# Patient Record
Sex: Female | Born: 1950
Health system: Southern US, Community
[De-identification: ages and names within clinical notes are randomized; demographics above are authoritative.]

## PROBLEM LIST (undated history)

## (undated) DIAGNOSIS — C50919 Malignant neoplasm of unspecified site of unspecified female breast: Secondary | ICD-10-CM

## (undated) DIAGNOSIS — Z923 Personal history of irradiation: Secondary | ICD-10-CM

## (undated) DIAGNOSIS — C801 Malignant (primary) neoplasm, unspecified: Secondary | ICD-10-CM

## (undated) DIAGNOSIS — Z9889 Other specified postprocedural states: Secondary | ICD-10-CM

## (undated) DIAGNOSIS — I4892 Unspecified atrial flutter: Secondary | ICD-10-CM

## (undated) DIAGNOSIS — K922 Gastrointestinal hemorrhage, unspecified: Secondary | ICD-10-CM

## (undated) DIAGNOSIS — I10 Essential (primary) hypertension: Secondary | ICD-10-CM

## (undated) DIAGNOSIS — K219 Gastro-esophageal reflux disease without esophagitis: Secondary | ICD-10-CM

## (undated) DIAGNOSIS — T7840XA Allergy, unspecified, initial encounter: Secondary | ICD-10-CM

## (undated) DIAGNOSIS — M810 Age-related osteoporosis without current pathological fracture: Secondary | ICD-10-CM

## (undated) DIAGNOSIS — I480 Paroxysmal atrial fibrillation: Secondary | ICD-10-CM

## (undated) DIAGNOSIS — Z86010 Personal history of colonic polyps: Secondary | ICD-10-CM

## (undated) DIAGNOSIS — C4491 Basal cell carcinoma of skin, unspecified: Secondary | ICD-10-CM

## (undated) DIAGNOSIS — T884XXA Failed or difficult intubation, initial encounter: Secondary | ICD-10-CM

## (undated) DIAGNOSIS — E876 Hypokalemia: Secondary | ICD-10-CM

## (undated) DIAGNOSIS — R112 Nausea with vomiting, unspecified: Secondary | ICD-10-CM

## (undated) DIAGNOSIS — Z8639 Personal history of other endocrine, nutritional and metabolic disease: Secondary | ICD-10-CM

## (undated) DIAGNOSIS — D649 Anemia, unspecified: Secondary | ICD-10-CM

## (undated) HISTORY — DX: Anemia, unspecified: D64.9

## (undated) HISTORY — DX: Gastrointestinal hemorrhage, unspecified: K92.2

## (undated) HISTORY — DX: Essential (primary) hypertension: I10

## (undated) HISTORY — DX: Gastro-esophageal reflux disease without esophagitis: K21.9

## (undated) HISTORY — PX: GASTRIC BYPASS: SHX52

## (undated) HISTORY — PX: BREAST LUMPECTOMY: SHX2

## (undated) HISTORY — PX: SPINE SURGERY: SHX786

## (undated) HISTORY — PX: APPENDECTOMY: SHX54

## (undated) HISTORY — DX: Allergy, unspecified, initial encounter: T78.40XA

## (undated) HISTORY — DX: Age-related osteoporosis without current pathological fracture: M81.0

## (undated) HISTORY — DX: Hypokalemia: E87.6

## (undated) HISTORY — DX: Personal history of other endocrine, nutritional and metabolic disease: Z86.39

## (undated) HISTORY — DX: Malignant (primary) neoplasm, unspecified: C80.1

## (undated) HISTORY — DX: Personal history of colonic polyps: Z86.010

## (undated) HISTORY — PX: REDUCTION MAMMAPLASTY: SUR839

## (undated) HISTORY — DX: Paroxysmal atrial fibrillation: I48.0

## (undated) HISTORY — DX: Basal cell carcinoma of skin, unspecified: C44.91

## (undated) HISTORY — DX: Unspecified atrial flutter: I48.92

## (undated) HISTORY — PX: CARPAL TUNNEL RELEASE: SHX101

## (undated) HISTORY — PX: CHOLECYSTECTOMY: SHX55

---

## 1985-06-16 HISTORY — PX: OTHER SURGICAL HISTORY: SHX169

## 1997-06-16 HISTORY — PX: TOTAL ABDOMINAL HYSTERECTOMY: SHX209

## 1998-06-16 DIAGNOSIS — M810 Age-related osteoporosis without current pathological fracture: Secondary | ICD-10-CM

## 1998-06-16 HISTORY — DX: Age-related osteoporosis without current pathological fracture: M81.0

## 1998-07-17 ENCOUNTER — Encounter: Payer: Self-pay | Admitting: Orthopedic Surgery

## 1998-07-17 ENCOUNTER — Ambulatory Visit (HOSPITAL_COMMUNITY): Admission: RE | Admit: 1998-07-17 | Discharge: 1998-07-17 | Payer: Self-pay | Admitting: Orthopedic Surgery

## 1998-09-12 ENCOUNTER — Encounter: Payer: Self-pay | Admitting: Neurosurgery

## 1998-09-12 ENCOUNTER — Inpatient Hospital Stay (HOSPITAL_COMMUNITY): Admission: RE | Admit: 1998-09-12 | Discharge: 1998-09-12 | Payer: Self-pay | Admitting: Neurosurgery

## 1998-10-07 ENCOUNTER — Ambulatory Visit (HOSPITAL_COMMUNITY): Admission: RE | Admit: 1998-10-07 | Discharge: 1998-10-07 | Payer: Self-pay | Admitting: Neurosurgery

## 1998-10-07 ENCOUNTER — Encounter: Payer: Self-pay | Admitting: Neurosurgery

## 1998-10-25 ENCOUNTER — Ambulatory Visit (HOSPITAL_COMMUNITY): Admission: RE | Admit: 1998-10-25 | Discharge: 1998-10-25 | Payer: Self-pay | Admitting: Neurosurgery

## 1998-11-15 ENCOUNTER — Ambulatory Visit (HOSPITAL_COMMUNITY): Admission: RE | Admit: 1998-11-15 | Discharge: 1998-11-15 | Payer: Self-pay | Admitting: Family Medicine

## 1998-11-15 ENCOUNTER — Encounter: Payer: Self-pay | Admitting: Family Medicine

## 1999-08-12 ENCOUNTER — Other Ambulatory Visit: Admission: RE | Admit: 1999-08-12 | Discharge: 1999-08-12 | Payer: Self-pay | Admitting: Obstetrics and Gynecology

## 2001-03-17 ENCOUNTER — Encounter: Admission: RE | Admit: 2001-03-17 | Discharge: 2001-06-15 | Payer: Self-pay | Admitting: Internal Medicine

## 2001-12-22 ENCOUNTER — Encounter: Payer: Self-pay | Admitting: Neurosurgery

## 2001-12-22 ENCOUNTER — Encounter: Admission: RE | Admit: 2001-12-22 | Discharge: 2001-12-22 | Payer: Self-pay | Admitting: Neurosurgery

## 2003-06-17 DIAGNOSIS — Z9884 Bariatric surgery status: Secondary | ICD-10-CM

## 2003-06-17 HISTORY — DX: Bariatric surgery status: Z98.84

## 2003-08-25 ENCOUNTER — Other Ambulatory Visit: Admission: RE | Admit: 2003-08-25 | Discharge: 2003-08-25 | Payer: Self-pay | Admitting: Radiology

## 2003-08-25 ENCOUNTER — Encounter (INDEPENDENT_AMBULATORY_CARE_PROVIDER_SITE_OTHER): Payer: Self-pay | Admitting: *Deleted

## 2003-08-25 ENCOUNTER — Encounter: Admission: RE | Admit: 2003-08-25 | Discharge: 2003-08-25 | Payer: Self-pay | Admitting: Obstetrics and Gynecology

## 2004-05-08 ENCOUNTER — Ambulatory Visit: Payer: Self-pay | Admitting: Internal Medicine

## 2004-05-17 ENCOUNTER — Ambulatory Visit: Payer: Self-pay | Admitting: Internal Medicine

## 2004-12-19 ENCOUNTER — Ambulatory Visit: Payer: Self-pay | Admitting: Internal Medicine

## 2004-12-23 ENCOUNTER — Ambulatory Visit: Payer: Self-pay | Admitting: Internal Medicine

## 2005-04-01 ENCOUNTER — Ambulatory Visit: Payer: Self-pay | Admitting: Internal Medicine

## 2005-06-18 ENCOUNTER — Ambulatory Visit: Payer: Self-pay | Admitting: Internal Medicine

## 2005-08-05 ENCOUNTER — Ambulatory Visit: Payer: Self-pay | Admitting: Internal Medicine

## 2005-11-05 ENCOUNTER — Ambulatory Visit: Payer: Self-pay | Admitting: Internal Medicine

## 2005-11-20 ENCOUNTER — Ambulatory Visit: Payer: Self-pay | Admitting: Internal Medicine

## 2006-02-20 ENCOUNTER — Ambulatory Visit: Payer: Self-pay | Admitting: Internal Medicine

## 2006-03-17 ENCOUNTER — Ambulatory Visit: Payer: Self-pay | Admitting: Internal Medicine

## 2006-05-30 ENCOUNTER — Encounter: Admission: RE | Admit: 2006-05-30 | Discharge: 2006-05-30 | Payer: Self-pay | Admitting: Internal Medicine

## 2007-02-11 ENCOUNTER — Encounter: Payer: Self-pay | Admitting: Internal Medicine

## 2007-03-08 DIAGNOSIS — I10 Essential (primary) hypertension: Secondary | ICD-10-CM | POA: Insufficient documentation

## 2007-03-08 DIAGNOSIS — F329 Major depressive disorder, single episode, unspecified: Secondary | ICD-10-CM | POA: Insufficient documentation

## 2007-03-08 DIAGNOSIS — R51 Headache: Secondary | ICD-10-CM | POA: Insufficient documentation

## 2007-03-08 DIAGNOSIS — R519 Headache, unspecified: Secondary | ICD-10-CM | POA: Insufficient documentation

## 2007-03-30 ENCOUNTER — Encounter: Payer: Self-pay | Admitting: Internal Medicine

## 2007-04-16 LAB — CONVERTED CEMR LAB: Pap Smear: NORMAL

## 2007-05-20 ENCOUNTER — Encounter: Payer: Self-pay | Admitting: Internal Medicine

## 2007-05-28 ENCOUNTER — Ambulatory Visit: Payer: Self-pay | Admitting: Internal Medicine

## 2007-05-28 DIAGNOSIS — E538 Deficiency of other specified B group vitamins: Secondary | ICD-10-CM | POA: Insufficient documentation

## 2007-09-22 ENCOUNTER — Ambulatory Visit: Payer: Self-pay | Admitting: Internal Medicine

## 2007-09-22 LAB — CONVERTED CEMR LAB
Folate: 11.1 ng/mL
Hgb A1c MFr Bld: 5.4 % (ref 4.6–6.0)
Iron: 87 ug/dL (ref 42–145)
Saturation Ratios: 23.9 % (ref 20.0–50.0)
Transferrin: 260.3 mg/dL (ref >=212.0)
Vitamin B-12: 622 pg/mL (ref 211–911)

## 2007-09-23 ENCOUNTER — Encounter: Payer: Self-pay | Admitting: Internal Medicine

## 2007-09-23 LAB — CONVERTED CEMR LAB: Vit D, 1,25-Dihydroxy: 15 — ABNORMAL LOW (ref 30–89)

## 2007-10-01 ENCOUNTER — Ambulatory Visit: Payer: Self-pay | Admitting: Internal Medicine

## 2007-10-01 DIAGNOSIS — E569 Vitamin deficiency, unspecified: Secondary | ICD-10-CM | POA: Insufficient documentation

## 2008-01-20 ENCOUNTER — Ambulatory Visit: Payer: Self-pay | Admitting: Internal Medicine

## 2008-01-20 LAB — CONVERTED CEMR LAB
Basophils Absolute: 0 10*3/uL (ref 0.0–0.1)
Basophils Relative: 0.4 % (ref 0.0–3.0)
Eosinophils Absolute: 0.1 10*3/uL (ref 0.0–0.7)
Eosinophils Relative: 1.2 % (ref 0.0–5.0)
HCT: 35.3 % — ABNORMAL LOW (ref 36.0–46.0)
Hemoglobin: 12.2 g/dL (ref 12.0–15.0)
Inflenza A Ag: NEGATIVE
Lymphocytes Relative: 30 % (ref 12.0–46.0)
MCHC: 34.5 g/dL (ref 30.0–36.0)
MCV: 94.8 fL (ref 78.0–100.0)
Monocytes Absolute: 0.4 10*3/uL (ref 0.1–1.0)
Monocytes Relative: 6.4 % (ref 3.0–12.0)
Neutro Abs: 3.3 10*3/uL (ref 1.4–7.7)
Neutrophils Relative %: 62 % (ref 43.0–77.0)
Platelets: 197 10*3/uL (ref 150–400)
RBC: 3.72 M/uL — ABNORMAL LOW (ref 3.87–5.11)
RDW: 12.8 % (ref 11.5–14.6)
WBC: 5.5 10*3/uL (ref 4.5–10.5)

## 2008-01-27 ENCOUNTER — Telehealth: Payer: Self-pay | Admitting: Internal Medicine

## 2008-03-22 ENCOUNTER — Ambulatory Visit: Payer: Self-pay | Admitting: Internal Medicine

## 2008-03-22 DIAGNOSIS — J0100 Acute maxillary sinusitis, unspecified: Secondary | ICD-10-CM | POA: Insufficient documentation

## 2008-03-22 DIAGNOSIS — M6740 Ganglion, unspecified site: Secondary | ICD-10-CM | POA: Insufficient documentation

## 2008-03-23 ENCOUNTER — Encounter: Payer: Self-pay | Admitting: Internal Medicine

## 2008-03-28 LAB — CONVERTED CEMR LAB: Vit D, 1,25-Dihydroxy: 22 — ABNORMAL LOW (ref 30–89)

## 2008-05-22 ENCOUNTER — Encounter: Payer: Self-pay | Admitting: Internal Medicine

## 2009-03-16 ENCOUNTER — Ambulatory Visit: Payer: Self-pay | Admitting: Internal Medicine

## 2009-03-16 LAB — CONVERTED CEMR LAB
ALT: 17 units/L (ref 0–35)
AST: 19 units/L (ref 0–37)
Albumin: 3.8 g/dL (ref 3.5–5.2)
Alkaline Phosphatase: 104 units/L (ref 39–117)
BUN: 13 mg/dL (ref 6–23)
Basophils Absolute: 0 10*3/uL (ref 0.0–0.1)
Basophils Relative: 0.6 % (ref 0.0–3.0)
Bilirubin Urine: NEGATIVE
Bilirubin, Direct: 0.1 mg/dL (ref 0.0–0.3)
CO2: 31 meq/L (ref 19–32)
Calcium: 9.1 mg/dL (ref 8.4–10.5)
Chloride: 109 meq/L (ref 96–112)
Cholesterol: 147 mg/dL (ref 0–200)
Creatinine, Ser: 0.6 mg/dL (ref 0.4–1.2)
Creatinine,U: 88.6 mg/dL
Eosinophils Absolute: 0.1 10*3/uL (ref 0.0–0.7)
Eosinophils Relative: 1.3 % (ref 0.0–5.0)
GFR calc non Af Amer: 109.93 mL/min (ref >60)
Glucose, Bld: 95 mg/dL (ref 70–99)
Glucose, Urine, Semiquant: NEGATIVE
HCT: 38.7 % (ref 36.0–46.0)
HDL: 60.9 mg/dL (ref >39.00)
Hemoglobin: 13.4 g/dL (ref 12.0–15.0)
Hgb A1c MFr Bld: 5.4 % (ref 4.6–6.5)
Ketones, urine, test strip: NEGATIVE
LDL Cholesterol: 78 mg/dL (ref 0–99)
Lymphocytes Relative: 30 % (ref 12.0–46.0)
Lymphs Abs: 1.5 10*3/uL (ref 0.7–4.0)
MCHC: 34.6 g/dL (ref 30.0–36.0)
MCV: 92.7 fL (ref 78.0–100.0)
Microalb Creat Ratio: 2.3 mg/g (ref 0.0–30.0)
Microalb, Ur: 0.2 mg/dL (ref 0.0–1.9)
Monocytes Absolute: 0.4 10*3/uL (ref 0.1–1.0)
Monocytes Relative: 7.7 % (ref 3.0–12.0)
Neutro Abs: 3 10*3/uL (ref 1.4–7.7)
Neutrophils Relative %: 60.4 % (ref 43.0–77.0)
Nitrite: NEGATIVE
Platelets: 193 10*3/uL (ref 150.0–400.0)
Potassium: 3.7 meq/L (ref 3.5–5.1)
Protein, U semiquant: NEGATIVE
RBC: 4.17 M/uL (ref 3.87–5.11)
RDW: 12.5 % (ref 11.5–14.6)
Sodium: 142 meq/L (ref 135–145)
Specific Gravity, Urine: 1.02
TSH: 1.84 microintl units/mL (ref 0.35–5.50)
Total Bilirubin: 0.6 mg/dL (ref 0.3–1.2)
Total CHOL/HDL Ratio: 2
Total Protein: 6.5 g/dL (ref 6.0–8.3)
Triglycerides: 41 mg/dL (ref 0.0–149.0)
Urobilinogen, UA: 1
VLDL: 8.2 mg/dL (ref 0.0–40.0)
WBC Urine, dipstick: NEGATIVE
WBC: 5 10*3/uL (ref 4.5–10.5)
pH: 6

## 2009-03-27 ENCOUNTER — Ambulatory Visit: Payer: Self-pay | Admitting: Internal Medicine

## 2009-03-27 DIAGNOSIS — L500 Allergic urticaria: Secondary | ICD-10-CM | POA: Insufficient documentation

## 2009-06-15 LAB — CONVERTED CEMR LAB: Pap Smear: NORMAL

## 2009-11-02 ENCOUNTER — Telehealth: Payer: Self-pay | Admitting: Family Medicine

## 2009-11-03 ENCOUNTER — Ambulatory Visit: Payer: Self-pay | Admitting: Family Medicine

## 2009-11-07 ENCOUNTER — Telehealth: Payer: Self-pay | Admitting: Internal Medicine

## 2009-11-09 ENCOUNTER — Ambulatory Visit: Payer: Self-pay | Admitting: Family Medicine

## 2009-11-20 ENCOUNTER — Telehealth: Payer: Self-pay | Admitting: Internal Medicine

## 2010-03-18 ENCOUNTER — Ambulatory Visit: Payer: Self-pay | Admitting: Family Medicine

## 2010-03-18 DIAGNOSIS — M542 Cervicalgia: Secondary | ICD-10-CM | POA: Insufficient documentation

## 2010-04-29 ENCOUNTER — Encounter: Payer: Self-pay | Admitting: Internal Medicine

## 2010-05-15 ENCOUNTER — Encounter: Admission: RE | Admit: 2010-05-15 | Discharge: 2010-05-15 | Payer: Self-pay | Admitting: Neurosurgery

## 2010-05-20 ENCOUNTER — Ambulatory Visit: Payer: Self-pay | Admitting: Internal Medicine

## 2010-05-20 LAB — CONVERTED CEMR LAB
ALT: 14 U/L
AST: 19 U/L
Albumin: 3.7 g/dL
Alkaline Phosphatase: 77 U/L
BUN: 16 mg/dL
Basophils Absolute: 0 K/uL
Basophils Relative: 0.6 %
Bilirubin Urine: NEGATIVE
Bilirubin, Direct: 0.1 mg/dL
CO2: 27 meq/L
Calcium: 9 mg/dL
Chloride: 105 meq/L
Cholesterol: 154 mg/dL
Creatinine, Ser: 0.7 mg/dL
Creatinine,U: 227.7 mg/dL
Eosinophils Absolute: 0.1 K/uL
Eosinophils Relative: 2.1 %
GFR calc non Af Amer: 96.38 mL/min
Glucose, Bld: 88 mg/dL
Glucose, Urine, Semiquant: NEGATIVE
HCT: 35.5 % — ABNORMAL LOW
HDL: 69.2 mg/dL
Hemoglobin: 12.3 g/dL
Hgb A1c MFr Bld: 5.5 %
Ketones, urine, test strip: NEGATIVE
LDL Cholesterol: 67 mg/dL
Lymphocytes Relative: 31.8 %
Lymphs Abs: 1.6 K/uL
MCHC: 34.6 g/dL
MCV: 94.1 fL
Microalb Creat Ratio: 0.4 mg/g
Microalb, Ur: 0.8 mg/dL
Monocytes Absolute: 0.3 K/uL
Monocytes Relative: 5.9 %
Neutro Abs: 3 K/uL
Neutrophils Relative %: 59.6 %
Nitrite: NEGATIVE
Platelets: 217 K/uL
Potassium: 4 meq/L
Protein, U semiquant: NEGATIVE
RBC: 3.77 M/uL — ABNORMAL LOW
RDW: 13.6 %
Sodium: 140 meq/L
Specific Gravity, Urine: >=1.03
TSH: 1.96 u[IU]/mL
Total Bilirubin: 0.7 mg/dL
Total CHOL/HDL Ratio: 2
Total Protein: 6.2 g/dL
Triglycerides: 88 mg/dL
Urobilinogen, UA: 0.2
VLDL: 17.6 mg/dL
WBC Urine, dipstick: NEGATIVE
WBC: 5 10*3/microliter
pH: 5

## 2010-05-24 ENCOUNTER — Encounter: Payer: Self-pay | Admitting: Internal Medicine

## 2010-06-04 ENCOUNTER — Encounter: Payer: Self-pay | Admitting: Internal Medicine

## 2010-06-04 ENCOUNTER — Ambulatory Visit: Payer: Self-pay | Admitting: Internal Medicine

## 2010-06-04 DIAGNOSIS — M949 Disorder of cartilage, unspecified: Secondary | ICD-10-CM

## 2010-06-04 DIAGNOSIS — M899 Disorder of bone, unspecified: Secondary | ICD-10-CM | POA: Insufficient documentation

## 2010-06-04 DIAGNOSIS — L919 Hypertrophic disorder of the skin, unspecified: Secondary | ICD-10-CM

## 2010-06-04 DIAGNOSIS — L909 Atrophic disorder of skin, unspecified: Secondary | ICD-10-CM | POA: Insufficient documentation

## 2010-06-04 LAB — HM MAMMOGRAPHY

## 2010-06-05 ENCOUNTER — Telehealth: Payer: Self-pay | Admitting: Internal Medicine

## 2010-06-06 LAB — CONVERTED CEMR LAB: Vit D, 25-Hydroxy: 28 ng/mL — ABNORMAL LOW (ref 30–89)

## 2010-06-16 HISTORY — PX: FOOT SURGERY: SHX648

## 2010-07-18 NOTE — Progress Notes (Signed)
**Note De-Identified Tozzi Obfuscation** Summary: sinus infection  Phone Note Call from Patient   Caller: Patient Call For: Stacie Glaze MD Summary of Call: Pt is asking for cough meds and antibiotic for sinus infection.  Symptoms are sore throat, headache, congestion in head with productive (yellow) cough.  No fever.  States she has seen Dr. Clent Ridges, and would like [place] to ask him since Dr. Lovell Sheehan is not in the office. [facility] [phone] Pt # .Marland KitchenMarland Kitchen [phone] Initial call taken by: Lynann Beaver CMA,  Nov 02, 2009 3:20 PM  Follow-up for Phone Call        No she needs an OV. I would suggest the Sat[facility] Follow-up by: Nelwyn Salisbury MD,  Nov 02, 2009 4:26 PM  Additional Follow-up for Phone Call Additional follow up Details #1::        Pt given Dr. Claris Che recommendations, and she will call for an appt tomorrow if no better. Additional Follow-up by: Lynann Beaver CMA,  Nov 02, 2009 4:31 PM

## 2010-07-18 NOTE — Assessment & Plan Note (Signed)
**Note De-Identified Boehringer Obfuscation** Summary: sinusitis/cb   Vital Signs:  Patient profile:   60 year old female Weight:      170 pounds BMI:     29.29 Temp:     98.4 degrees F oral BP sitting:   120 / 84  (left arm) Cuff size:   regular  Vitals Entered By: Raechel Ache, RN (Nov 09, 2009 9:58 AM) CC: C/o headache, sore throat, cough, hoarseness. Was seen at Sat clinic and given meds and not much better.   History of Present Illness: Here for continued symptoms of sinus pressure, PND, ST, HA, hoarseness, and coughing up yellow sputum for the past 10 days. No fevers. She was seen at the Saturday clinic a week ago and was given Amoxicillin, but she is no better.   Allergies: 1)  ! Macrodantin 2)  ! Sulfa 3)  ! Biaxin  Past History:  Past Medical History: Reviewed history from 03/08/2007 and no changes required. Hypokalemia Diabetes mellitus, type II Headache Depression Hypertension  Review of Systems  The patient denies anorexia, fever, weight loss, weight gain, vision loss, decreased hearing, hoarseness, chest pain, syncope, dyspnea on exertion, peripheral edema, hemoptysis, abdominal pain, melena, hematochezia, severe indigestion/heartburn, hematuria, incontinence, genital sores, muscle weakness, suspicious skin lesions, transient blindness, difficulty walking, depression, unusual weight change, abnormal bleeding, enlarged lymph nodes, angioedema, breast masses, and testicular masses.    Physical Exam  General:  Well-developed,well-nourished,in no acute distress; alert,appropriate and cooperative throughout examination Head:  Normocephalic and atraumatic without obvious abnormalities. No apparent alopecia or balding. Eyes:  No corneal or conjunctival inflammation noted. EOMI. Perrla. Funduscopic exam benign, without hemorrhages, exudates or papilledema. Vision grossly normal. Ears:  External ear exam shows no significant lesions or deformities.  Otoscopic examination reveals clear canals, tympanic membranes  are intact bilaterally without bulging, retraction, inflammation or discharge. Hearing is grossly normal bilaterally. Nose:  External nasal examination shows no deformity or inflammation. Nasal mucosa are pink and moist without lesions or exudates. Mouth:  Oral mucosa and oropharynx without lesions or exudates.  Teeth in good repair. Neck:  No deformities, masses, or tenderness noted. Lungs:  Normal respiratory effort, chest expands symmetrically. Lungs are clear to auscultation, no crackles or wheezes. Voice is quite hoarse   Impression & Recommendations:  Problem # 1:  ACUTE MAXILLARY SINUSITIS (ICD-461.0)  The following medications were removed from the medication list:    Amoxicillin 500 Mg Tabs (Amoxicillin) .Marland Kitchen... 1 tab by mouth three times a day x 10 days    Cheratussin Ac 100-10 Mg/84ml Syrp (Guaifenesin-codeine) .Marland KitchenMarland KitchenMarland KitchenMarland Kitchen 5 ml by mouth at bedtime as needed cough Her updated medication list for this problem includes:    Bromfed 12-15 Mg Xr12h-cap (Brompheniramine-phenylephrine) ..... One by mouth bid    Flonase 50 Mcg/act Susp (Fluticasone propionate) .Marland Kitchen... 2 sprays/ nostril daily    Tussionex Pennkinetic Er 8-10 Mg/65ml Lqcr (Chlorpheniramine-hydrocodone) .Marland Kitchen... 1 tsp two times a day as needed cough    Levaquin 500 Mg Tabs (Levofloxacin) ..... Once daily  Complete Medication List: 1)  Zoloft 100 Mg Tabs (Sertraline hcl) .... One by mouth daily as needed 2)  Premarin 1.25 Mg Tabs (Estrogens conjugated) .Marland Kitchen.. 1 once daily 3)  Vitamin D 54098 Unit Caps (Ergocalciferol) .... One by mouth weekly 4)  K-99 595 Mg Caps (Potassium gluconate) .... Once daily 5)  Multivitamins Tabs (Multiple vitamin) .... Once daily 6)  B-12 250 Mcg Tabs (Cyanocobalamin) .... Once daily 7)  Co Q-10 30-5 Mg-unit Caps (Coenzyme q10-vitamin e) .... Once daily 8) **Note De-Identified Lueras Obfuscation** Bayer Aspirin Ec Low Dose 81 Mg Tbec (Aspirin) .Marland Kitchen.. 1 once daily 9)  Bromfed 12-15 Mg Xr12h-cap (Brompheniramine-phenylephrine) .... One by mouth bid 10)   Freestyle Lite Test Strp (Glucose blood) .... Test cbg two times a day as directed 11)  Cimetidine 300 Mg Tabs (Cimetidine) .... One  by mouth two times a day 12)  Fexofenadine Hcl 180 Mg Tabs (Fexofenadine hcl) .... One by mouth daily 13)  Flonase 50 Mcg/act Susp (Fluticasone propionate) .... 2 sprays/ nostril daily 14)  Diflucan 150 Mg Tabs (Fluconazole) .Marland Kitchen.. 1 tab by mouth x 1; repeat in 3 days if needed 15)  Tussionex Pennkinetic Er 8-10 Mg/68ml Lqcr (Chlorpheniramine-hydrocodone) .Marland Kitchen.. 1 tsp two times a day as needed cough 16)  Levaquin 500 Mg Tabs (Levofloxacin) .... Once daily  Patient Instructions: 1)  Out of work from 11-02-09 until 11-16-09.  2)  Please schedule a follow-up appointment as needed .  Prescriptions: LEVAQUIN 500 MG TABS (LEVOFLOXACIN) once daily  #10 x 0   Entered and Authorized by:   Nelwyn Salisbury MD   Signed by:   Nelwyn Salisbury MD on 11/09/2009   Method used:   Print then Give to Patient   RxID:   [id number] Sandria Senter ER 8-10 MG/5ML LQCR (CHLORPHENIRAMINE-HYDROCODONE) 1 tsp two times a day as needed cough  #240 x 0   Entered and Authorized by:   Nelwyn Salisbury MD   Signed by:   Nelwyn Salisbury MD on 11/09/2009   Method used:   Print then Give to Patient   RxID:   [id number]

## 2010-07-18 NOTE — Progress Notes (Signed)
**Note De-Identified Dolder Obfuscation** Summary: OOW note  Phone Note Call from Patient   Caller: Patient Call For: Felicia Glaze MD Reason for Call: Talk to Doctor Summary of Call: Pt tried to go back to work, but had to leave due to fever and laryngitis. Needs  revised out of work note.   Dr. Clent Ridges told her if she needed longer, to call back.    11/02/2009-11/18/2009 and return 11/19/2009 [phone] Wants to pick up this afternoon. Initial call taken by: Lynann Beaver CMA,  November 20, 2009 9:48 AM  Follow-up for Phone Call        pt informed that letter is ready for pick up Follow-up by: Willy Eddy, LPN,  November 20, 1912 10:09 AM

## 2010-07-18 NOTE — Assessment & Plan Note (Signed)
**Note De-Identified Kurtzman Obfuscation** Summary: CPX/RCD/PT [facility] FROM [facility]   Vital Signs:  Patient profile:   60 year old female Height:      64 inches Weight:      174 pounds BMI:     29.97 Temp:     98 degrees F oral Pulse rate:   72 / minute Resp:     14 per minute BP sitting:   124 / 80  (left arm)  Vitals Entered By: Willy Eddy, LPN (June 04, 2010 4:19 PM) CC: cpx-no colonoscopy, Hypertension Management Is Patient Diabetic? No   Primary Care Provider:  Stacie Glaze MD  CC:  cpx-no colonoscopy and Hypertension Management.  History of Present Illness: the pt has gained weight due to foot surgery and less walking wants to know the lest diet in light of her gastric bypass wants to  know if sensa is OK? The pt was asked about all immunizations, health maint. services that are appropriate to their age and was given guidance on diet exercize  and weight management Several days of positional dizzyness Hx of nasal  polyps monitering of DM and HTN off the aspirin due to the foot surgery    Hypertension History:      She denies headache, chest pain, palpitations, dyspnea with exertion, orthopnea, PND, peripheral edema, visual symptoms, neurologic problems, syncope, and side effects from treatment.        Positive major cardiovascular risk factors include female age 12 years old or older, diabetes, and hypertension.  Negative major cardiovascular risk factors include non-tobacco-user status.      Preventive Screening-Counseling & Management  Alcohol-Tobacco     Smoking Status: never     Tobacco Counseling: not indicated; no tobacco use  Problems Prior to Update: 1)  Unspecified Hypertrophic&atrophic Condition Skin  (ICD-701.9) 2)  Osteopenia  (ICD-733.90) 3)  Neck Pain  (ICD-723.1) 4)  Allergic Urticaria  (ICD-708.0) 5)  Acute Maxillary Sinusitis  (ICD-461.0) 6)  Ganglion Cyst, Wrist, Right  (ICD-727.41) 7)  Influenza With Other Manifestations  (ICD-487.8) 8)  Unspecified Vitamin Deficiency   (ICD-269.2) 9)  Preventive Health Care  (ICD-V70.0) 10)  B12 Deficiency  (ICD-266.2) 11)  Hypertension  (ICD-401.9) 12)  Depression  (ICD-311) 13)  Headache  (ICD-784.0)  Current Problems (verified): 1)  Neck Pain  (ICD-723.1) 2)  Allergic Urticaria  (ICD-708.0) 3)  Acute Maxillary Sinusitis  (ICD-461.0) 4)  Ganglion Cyst, Wrist, Right  (ICD-727.41) 5)  Influenza With Other Manifestations  (ICD-487.8) 6)  Unspecified Vitamin Deficiency  (ICD-269.2) 7)  Preventive Health Care  (ICD-V70.0) 8)  Disorder of Bone and Cartilage Unspecified  (ICD-733.90) 9)  B12 Deficiency  (ICD-266.2) 10)  Hypertension  (ICD-401.9) 11)  Depression  (ICD-311) 12)  Headache  (ICD-784.0) 13)  Diabetes Mellitus, Type II  (ICD-250.00)  Medications Prior to Update: 1)  Zoloft 100 Mg  Tabs (Sertraline Hcl) .... One By Mouth Daily As Needed 2)  Premarin 1.25 Mg  Tabs (Estrogens Conjugated) .Marland Kitchen.. 1 Once Daily 3)  Vitamin D 16109 Unit  Caps (Ergocalciferol) .... One By Mouth Weekly 4)  K-99 595 Mg  Caps (Potassium Gluconate) .... Once Daily 5)  Multivitamins   Tabs (Multiple Vitamin) .... Once Daily 6)  B-12 250 Mcg  Tabs (Cyanocobalamin) .... Once Daily 7)  Co Q-10 30-5 Mg-Unit  Caps (Coenzyme Q10-Vitamin E) .... Once Daily 8)  Bayer Aspirin Ec Low Dose 81 Mg Tbec (Aspirin) .Marland Kitchen.. 1 Once Daily 9)  Bromfed 12-15 Mg Xr12h-Cap (Brompheniramine-Phenylephrine) .... One By Mouth Bid 10) **Note De-Identified Mundo Obfuscation** Freestyle Lite Test  Strp (Glucose Blood) .... Test Cbg Two Times A Day As Directed 11)  Cimetidine 300 Mg Tabs (Cimetidine) .... One  By Mouth Two Times A Day 12)  Fexofenadine Hcl 180 Mg Tabs (Fexofenadine Hcl) .... One By Mouth Daily 13)  Flonase 50 Mcg/act Susp (Fluticasone Propionate) .... 2 Sprays/ Nostril Daily 14)  Diflucan 150 Mg Tabs (Fluconazole) .Marland Kitchen.. 1 Tab By Mouth X 1; Repeat in 3 Days If Needed 15)  Tussionex Pennkinetic Er 8-10 Mg/1ml Lqcr (Chlorpheniramine-Hydrocodone) .Marland Kitchen.. 1 Tsp Two Times A Day As Needed Cough 16)   Flexeril 10 Mg Tabs (Cyclobenzaprine Hcl) .... Three Times A Day As Needed Spasm 17)  Vicodin Hp 10-660 Mg Tabs (Hydrocodone-Acetaminophen) .Marland Kitchen.. 1 Q 6 Hours As Needed Pain  Current Medications (verified): 1)  Zoloft 100 Mg  Tabs (Sertraline Hcl) .... One By Mouth Daily As Needed 2)  Premarin 1.25 Mg  Tabs (Estrogens Conjugated) .Marland Kitchen.. 1 Once Daily 3)  Vitamin D 36644 Unit  Caps (Ergocalciferol) .... One By Mouth Weekly 4)  K-99 595 Mg  Caps (Potassium Gluconate) .... Once Daily 5)  Multivitamins   Tabs (Multiple Vitamin) .... Once Daily 6)  B-12 250 Mcg  Tabs (Cyanocobalamin) .... Once Daily 7)  Co Q-10 30-5 Mg-Unit  Caps (Coenzyme Q10-Vitamin E) .... Once Daily 8)  Bayer Aspirin Ec Low Dose 81 Mg Tbec (Aspirin) .Marland Kitchen.. 1 Once Daily 9)  Bromfed 12-15 Mg Xr12h-Cap (Brompheniramine-Phenylephrine) .... One By Mouth Bid 10)  Cimetidine 300 Mg Tabs (Cimetidine) .... One  By Mouth Two Times A Day 11)  Clarinex 5 Mg Tabs (Desloratadine) .... One By Mouth Daily 12)  Flonase 50 Mcg/act Susp (Fluticasone Propionate) .... 2 Sprays/ Nostril Daily 13)  Flexeril 10 Mg Tabs (Cyclobenzaprine Hcl) .... Three Times A Day As Needed Spasm 14)  Vicodin Hp 10-660 Mg Tabs (Hydrocodone-Acetaminophen) .Marland Kitchen.. 1 Q 6 Hours As Needed Pain 15)  Antivert 12.5 Mg Tabs (Meclizine Hcl) .... One By Mouth Three Times A Day As Needed  Allergies (verified): 1)  ! Macrodantin 2)  ! Sulfa 3)  ! Biaxin  Past History:  Family History: Last updated: 03/22/2008 Family History Hypertension  Social History: Last updated: 10/01/2007 Occupation: Married Never Smoked  Risk Factors: Exercise: yes (10/01/2007)  Risk Factors: Smoking Status: never (06/04/2010)  Past medical, surgical, family and social histories (including risk factors) reviewed, and no changes noted (except as noted below).  Past Medical History: Reviewed history from 03/08/2007 and no changes required. Hypokalemia Diabetes mellitus, type  II Headache Depression Hypertension  Past Surgical History: Reviewed history from 03/18/2010 and no changes required. TAH Gastric bypass Cervical fusion at C4-5 in 2000 per Dr. Gerlene Fee  Family History: Reviewed history from 03/22/2008 and no changes required. Family History Hypertension  Social History: Reviewed history from 10/01/2007 and no changes required. Occupation: Married Never Smoked  Review of Systems       The patient complains of weight gain.  The patient denies anorexia, fever, weight loss, vision loss, decreased hearing, hoarseness, chest pain, syncope, dyspnea on exertion, peripheral edema, prolonged cough, headaches, hemoptysis, abdominal pain, melena, hematochezia, severe indigestion/heartburn, hematuria, incontinence, genital sores, muscle weakness, suspicious skin lesions, transient blindness, difficulty walking, depression, unusual weight change, abnormal bleeding, enlarged lymph nodes, angioedema, and breast masses.    Physical Exam  General:  Well-developed,well-nourished,in no acute distress; alert,appropriate and cooperative throughout examination Head:  Normocephalic and atraumatic without obvious abnormalities. No apparent alopecia or balding. Eyes:  pupils equal and pupils round. **Note De-Identified Scheerer Obfuscation** Ears:  R ear normal and L ear normal.   Nose:  no external deformity and no nasal discharge.   Neck:  tender with spasms in the posterior neck. Reduced ROM Lungs:  normal respiratory effort and no wheezes.   Heart:  normal rate and regular rhythm.   Abdomen:  soft, non-tender, abdominal scar(s), and bowel sounds hypoactive.   Msk:  no joint swelling and no joint warmth.   Extremities:  trace left pedal edema and trace right pedal edema.   Neurologic:  alert & oriented X3, gait normal, and DTRs symmetrical and normal.   Skin:  turgor normal and color normal.   Cervical Nodes:  No lymphadenopathy noted Axillary Nodes:  No palpable lymphadenopathy Psych:  Oriented X3 and  not anxious appearing.     Impression & Recommendations:  Problem # 1:  OSTEOPENIA (ICD-733.90) Assessment Unchanged moniter vitamin d levels Orders: Venipuncture (04540) T-Vitamin D (25-Hydroxy) (98119-14782)  Discussed medication use, applications of heat or ice, and exercises.   Problem # 2:  PREVENTIVE HEALTH CARE (ICD-V70.0) Assessment: Unchanged  The pt was asked about all immunizations, health maint. services that are appropriate to their age and was given guidance on diet exercize  and weight management   Mammogram: normal (06/15/2009) Pap smear: normal (06/15/2009) Td Booster: Tdap (03/27/2009)   Flu Vax: Fluvax 3+ (03/27/2009)   Chol: 154 (05/20/2010)   HDL: 69.20 (05/20/2010)   LDL: 67 (05/20/2010)   TG: 88.0 (05/20/2010) TSH: 1.96 (05/20/2010)   HgbA1C: 5.5 (05/20/2010)   Next mammogram due:: 06/2010 (06/04/2010)  Discussed using sunscreen, use of alcohol, drug use, self breast exam, routine dental care, routine eye care, schedule for GYN exam, routine physical exam, seat belts, multiple vitamins, osteoporosis prevention, adequate calcium intake in diet, recommendations for immunizations, mammograms and Pap smears.  Discussed exercise and checking cholesterol.  Discussed gun safety, safe sex, and contraception.  Problem # 3:  HYPERTENSION (ICD-401.9) Assessment: Unchanged improved Orders: EKG w/ Interpretation (93000)  BP today: 124/80 Prior BP: 130/86 (03/18/2010)  Prior 10 Yr Risk Heart Disease: Not enough information (03/22/2008)  Labs Reviewed: K+: 4.0 (05/20/2010) Creat: : 0.7 (05/20/2010)   Chol: 154 (05/20/2010)   HDL: 69.20 (05/20/2010)   LDL: 67 (05/20/2010)   TG: 88.0 (05/20/2010)  Problem # 4:  UNSPECIFIED HYPERTROPHIC&ATROPHIC CONDITION SKIN (ICD-701.9) excessive skin fold from weight loss and frequent skin infections lost significant weight after bariatric surgery  Complete Medication List: 1)  Zoloft 100 Mg Tabs (Sertraline hcl) .... One by  mouth daily as needed 2)  Premarin 1.25 Mg Tabs (Estrogens conjugated) .Marland Kitchen.. 1 once daily 3)  Vitamin D 95621 Unit Caps (Ergocalciferol) .... One by mouth weekly 4)  K-99 595 Mg Caps (Potassium gluconate) .... Once daily 5)  Multivitamins Tabs (Multiple vitamin) .... Once daily 6)  B-12 250 Mcg Tabs (Cyanocobalamin) .... Once daily 7)  Co Q-10 30-5 Mg-unit Caps (Coenzyme q10-vitamin e) .... Once daily 8)  Bayer Aspirin Ec Low Dose 81 Mg Tbec (Aspirin) .Marland Kitchen.. 1 once daily 9)  Bromfed 12-15 Mg Xr12h-cap (Brompheniramine-phenylephrine) .... One by mouth bid 10)  Cimetidine 300 Mg Tabs (Cimetidine) .... One  by mouth two times a day 11)  Clarinex 5 Mg Tabs (Desloratadine) .... One by mouth daily 12)  Flonase 50 Mcg/act Susp (Fluticasone propionate) .... 2 sprays/ nostril daily 13)  Flexeril 10 Mg Tabs (Cyclobenzaprine hcl) .... Three times a day as needed spasm 14)  Vicodin Hp 10-660 Mg Tabs (Hydrocodone-acetaminophen) .Marland Kitchen.. 1 q 6 hours as needed **Note De-Identified Mokry Obfuscation** pain 15)  Antivert 12.5 Mg Tabs (Meclizine hcl) .... One by mouth three times a day as needed  Hypertension Assessment/Plan:      The patient's hypertensive risk group is category C: Target organ damage and/or diabetes.  Her calculated 10 year risk of coronary heart disease is 8 %.  Today's blood pressure is 124/80.  Her blood pressure goal is < 130/80.  Patient Instructions: 1)  DASH diet is the best one to follow 2)  Please schedule a follow-up appointment in 4 months. Prescriptions: ANTIVERT 12.5 MG TABS (MECLIZINE HCL) one by mouth three times a day as needed  #60 x 0   Entered and Authorized by:   Stacie Glaze MD   Signed by:   Stacie Glaze MD on 06/04/2010   Method used:   Electronically to        [facility]* (retail)       [address]       [place], [place]  [address]       Ph: [phone]       Fax: [phone]   RxID:   [id number] CLARINEX 5 MG TABS (DESLORATADINE) one by mouth daily  #90 x 3   Entered and  Authorized by:   Stacie Glaze MD   Signed by:   Stacie Glaze MD on 06/04/2010   Method used:   Faxed to ...       [facility] (mail-order)       [address] [place], [place]  [address]       Ph: [phone]       Fax: [phone]   RxID:   [id number] CIMETIDINE 300 MG TABS (CIMETIDINE) one  by mouth two times a day  #180 x 3   Entered and Authorized by:   Stacie Glaze MD   Signed by:   Stacie Glaze MD on 06/04/2010   Method used:   Faxed to ...       [facility] (mail-order)       [address] [place], [place]  [address]       Ph: [phone]       Fax: [phone]   RxID:   [id number] FLONASE 50 MCG/ACT SUSP (FLUTICASONE PROPIONATE) 2 sprays/ nostril daily  #3 units x 3   Entered and Authorized by:   Stacie Glaze MD   Signed by:   Stacie Glaze MD on 06/04/2010   Method used:   Faxed to ...       [facility] (mail-order)       [address] [place], [place]  [address]       Ph: [phone]       Fax: [phone]   RxID:   [id number] PREMARIN 1.25 MG  TABS (ESTROGENS CONJUGATED) 1 once daily  #90 x 0   Entered and Authorized by:   Stacie Glaze MD   Signed by:   Stacie Glaze MD on 06/04/2010   Method used:   Faxed to ...       [facility] (mail-order)       [address] [place], [place]  [address]       Ph: [phone]       Fax: [phone]   RxID:   [id number] ZOLOFT 100 MG  TABS (SERTRALINE HCL) one by mouth **Note De-Identified Long Obfuscation** daily as needed  #90 x 3   Entered and Authorized by:   Stacie Glaze MD   Signed by:   Stacie Glaze MD on 06/04/2010   Method used:   Faxed to ...       [facility] (mail-order)       [address] [place], [place]  [address]       Ph: [phone]       Fax: [phone]   RxID:   [id number]    Orders Added: 1)  EKG w/ Interpretation [93000] 2)  Venipuncture [25366] 3)  T-Vitamin D (25-Hydroxy) [phone] 4)  Est. Patient 40-64 years [99396] 5)  Est. Patient Level III  [56387]   Immunization History:  Influenza Immunization History:    Influenza:  historical (03/16/2010)   Immunization History:  Influenza Immunization History:    Influenza:  Historical (03/16/2010)   Preventive Care Screening  Mammogram:    Date:  06/15/2009    Next Due:  06/2010    Results:  normal   Pap Smear:    Date:  06/15/2009    Next Due:  06/2010    Results:  normal

## 2010-07-18 NOTE — Progress Notes (Signed)
**Note De-Identified Wack Obfuscation** Summary: out of work note & ov request  Phone Note Call from Patient Call back at Home Phone [phone]   Reason for Call: Acute Illness Summary of Call: Saw Dr. Lynford Citizen Sat clinic sinusitis.  Gave amox, flonase, tussin ac with codeine.  Still out of work.  Work requiring out of work note Fri May 20 through tomorrow the 26th.  Will need to see Dr. Annetta Maw if not better.   Initial call taken by: Rudy Jew, RN,  Nov 07, 2009 2:14 PM  Follow-up for Phone Call        note ready for pick up and pt informed  can see another md if need be on friday- d rj out of office Follow-up by: Willy Eddy, LPN,  Nov 07, 2009 2:26 PM

## 2010-07-18 NOTE — Progress Notes (Signed)
**Note De-Identified Levett Obfuscation** Summary: sinusitis   _____________________________________________________________________  External Attachment:    Type:   Image     Comment:   External Document Vital Signs:  Patient profile:   60 year old female Height:      64 inches Weight:      170 pounds BMI:     29.29 O2 Sat:      97 % on Room air Temp:     97.6 degrees F oral Pulse rate:   74 / minute BP sitting:   100 / 74  (left arm) Cuff size:   large  Vitals Entered By: Payton Spark CMA (Nov 03, 2009 11:27 AM)  O2 Flow:  Room air CC: ? Sinus infection x 1 day. R ear pain, facial pressure and cough.    Primary Care Provider:  Stacie Glaze MD  CC:  ? Sinus infection x 1 day. R ear pain and facial pressure and cough. .  History of Present Illness: 60 yo WF presents for 1 day of voice hoarsness.  She has tender LNs in her neck with ear fullness, sore throat and facial pressure.  She had dry hacking cough with postnasal drip.  She is not having Fevers, chills or HA.  She does have allergies.  She has been taking allegra daily.    Allergies: 1)  ! Macrodantin 2)  ! Sulfa  Review of Systems      See HPI  Physical Exam  General:  alert, well-developed, well-nourished, and well-hydrated.   Head:  normocephalic and atraumatic.  maxillary sinuses TTP Eyes:  eyes slightly watery, conjunctiva clear Ears:  EACs patent; TMs translucent and gray with good cone of light and bony landmarks.  Nose:  clear rhinorrhea, boggy turbinates Mouth:  o/p pink and moist ith clear postnasal drip Neck:  no masses.  R>L anterior cervical chain LA Lungs:  Normal respiratory effort, chest expands symmetrically. Lungs are clear to auscultation, no crackles or wheezes. Heart:  Normal rate and regular rhythm. S1 and S2 normal without gallop, murmur, click, rub or other extra sounds. Skin:  color normal.     Impression & Recommendations:  Problem # 1:  ACUTE MAXILLARY SINUSITIS (ICD-461.0) Call Dr Lovell Sheehan if not improved in 10  days. Her updated medication list for this problem includes:    Bromfed 12-15 Mg Xr12h-cap (Brompheniramine-phenylephrine) ..... One by mouth bid    Flonase 50 Mcg/act Susp (Fluticasone propionate) .Marland Kitchen... 2 sprays/ nostril daily    Amoxicillin 500 Mg Tabs (Amoxicillin) .Marland Kitchen... 1 tab by mouth three times a day x 10 days    Cheratussin Ac 100-10 Mg/24ml Syrp (Guaifenesin-codeine) .Marland KitchenMarland KitchenMarland KitchenMarland Kitchen 5 ml by mouth at bedtime as needed cough  Complete Medication List: 1)  Zoloft 100 Mg Tabs (Sertraline hcl) .... One by mouth daily as needed 2)  Premarin 1.25 Mg Tabs (Estrogens conjugated) .Marland Kitchen.. 1 once daily 3)  Vitamin D 21308 Unit Caps (Ergocalciferol) .... One by mouth weekly 4)  K-99 595 Mg Caps (Potassium gluconate) .... Once daily 5)  Multivitamins Tabs (Multiple vitamin) .... Once daily 6)  B-12 250 Mcg Tabs (Cyanocobalamin) .... Once daily 7)  Co Q-10 30-5 Mg-unit Caps (Coenzyme q10-vitamin e) .... Once daily 8)  Bayer Aspirin Ec Low Dose 81 Mg Tbec (Aspirin) .Marland Kitchen.. 1 once daily 9)  Bromfed 12-15 Mg Xr12h-cap (Brompheniramine-phenylephrine) .... One by mouth bid 10)  Freestyle Lite Test Strp (Glucose blood) .... Test cbg two times a day as directed 11)  Cimetidine 300 Mg Tabs (Cimetidine) .... One  by **Note De-Identified Vollman Obfuscation** mouth two times a day 12)  Fexofenadine Hcl 180 Mg Tabs (Fexofenadine hcl) .... One by mouth daily 13)  Flonase 50 Mcg/act Susp (Fluticasone propionate) .... 2 sprays/ nostril daily 14)  Amoxicillin 500 Mg Tabs (Amoxicillin) .Marland Kitchen.. 1 tab by mouth three times a day x 10 days 15)  Diflucan 150 Mg Tabs (Fluconazole) .Marland Kitchen.. 1 tab by mouth x 1; repeat in 3 days if needed 16)  Cheratussin Ac 100-10 Mg/69ml Syrp (Guaifenesin-codeine) .... 5 ml by mouth at bedtime as needed cough  Patient Instructions: 1)  Take 10 days of Amoxicillin for sinusitis. 2)  Use diflucan if needed for yeast infection. 3)  Add Flonase to [employer] for allergies. 4)  Use RX cough medicine at night. 5)  F/U with Dr Lovell Sheehan if not improved after 10  days. Prescriptions: CHERATUSSIN AC 100-10 MG/5ML SYRP (GUAIFENESIN-CODEINE) 5 ml by mouth at bedtime as needed cough  #120 ml x 0   Entered and Authorized by:   Seymour Bars DO   Signed by:   Seymour Bars DO on 11/03/2009   Method used:   Printed then faxed to ...       [facility] [phone]* (retail)       [address]       [place], [place]  [address]       Ph: [phone]       Fax: [phone]   RxID:   [id number] DIFLUCAN 150 MG TABS (FLUCONAZOLE) 1 tab by mouth x 1; repeat in 3 days if needed  #2 tabs x 0   Entered and Authorized by:   Seymour Bars DO   Signed by:   Seymour Bars DO on 11/03/2009   Method used:   Electronically to        [facility] [phone]* (retail)       [address]       [place], [place]  [address]       Ph: [phone]       Fax: [phone]   RxID:   [id number] AMOXICILLIN 500 MG TABS (AMOXICILLIN) 1 tab by mouth three times a day x 10 days  #30 x 0   Entered and Authorized by:   Seymour Bars DO   Signed by:   Seymour Bars DO on 11/03/2009   Method used:   Electronically to        [facility] [phone]* (retail)       [address]       [place], [place]  [address]       Ph: [phone]       Fax: [phone]   RxID:   [id number] FLONASE 50 MCG/ACT SUSP (FLUTICASONE PROPIONATE) 2 sprays/ nostril daily  #1 bottle x 1   Entered and Authorized by:   Seymour Bars DO   Signed by:   Seymour Bars DO on 11/03/2009   Method used:   Electronically to        [facility] [phone]* (retail)       [address]       [place], [place]  [address]       Ph: [phone]       Fax: [phone]   RxID:   [id number]

## 2010-07-18 NOTE — Letter (Signed)
**Note De-Identified Hawkey Obfuscation** Summary: [facility]  [facility]   Imported By: Maryln Gottron 05/29/2010 15:23:28  _____________________________________________________________________  External Attachment:    Type:   Image     Comment:   External Document

## 2010-07-18 NOTE — Letter (Signed)
**Note De-Identified Billy Obfuscation** Summary: [facility]  [facility]   Imported By: Maryln Gottron 05/03/2010 13:27:36  _____________________________________________________________________  External Attachment:    Type:   Image     Comment:   External Document

## 2010-07-18 NOTE — Letter (Signed)
**Note De-Identified Bivins Obfuscation** Summary: [facility]. labs  [facility]. labs   Imported By: Kassie Mends 02/22/2007 13:32:02  _____________________________________________________________________  External Attachment:    Type:   Image     Comment:   [facility]. labs

## 2010-07-18 NOTE — Assessment & Plan Note (Signed)
**Note De-Identified Felicia Schultz Obfuscation** Summary: NECK PAIN//SLM   Vital Signs:  Patient profile:   60 year old female Weight:      174 pounds O2 Sat:      98 % Temp:     98.7 degrees F Pulse rate:   74 / minute BP sitting:   130 / 86  (left arm) Cuff size:   regular  Vitals Entered By: Pura Spice, RN (March 18, 2010 2:07 PM) CC: neck pain and shoulder pain states arms and hands going numb   History of Present Illness: Here for 2 months of sharp pains in the lower neck area with spasms. The pain radiates down both arms. No weakness or numbness of the arms. No trauma, but it started to bother her after she moved some furniture. It feels like her neck did before her surgery.   Allergies: 1)  ! Macrodantin 2)  ! Sulfa 3)  ! Biaxin  Past History:  Past Medical History: Reviewed history from 03/08/2007 and no changes required. Hypokalemia Diabetes mellitus, type II Headache Depression Hypertension  Past Surgical History: TAH Gastric bypass Cervical fusion at C4-5 in 2000 per Dr. Gerlene Fee  Review of Systems  The patient denies anorexia, fever, weight loss, weight gain, vision loss, decreased hearing, hoarseness, chest pain, syncope, dyspnea on exertion, peripheral edema, prolonged cough, headaches, hemoptysis, abdominal pain, melena, hematochezia, severe indigestion/heartburn, hematuria, incontinence, genital sores, muscle weakness, suspicious skin lesions, transient blindness, difficulty walking, depression, unusual weight change, abnormal bleeding, enlarged lymph nodes, angioedema, breast masses, and testicular masses.    Physical Exam  General:  Well-developed,well-nourished,in no acute distress; alert,appropriate and cooperative throughout examination Neck:  tender with spasms in the posterior neck. Reduced ROM   Impression & Recommendations:  Problem # 1:  NECK PAIN (ICD-723.1)  Her updated medication list for this problem includes:    Bayer Aspirin Ec Low Dose 81 Mg Tbec (Aspirin) .Marland Kitchen... 1 once  daily    Flexeril 10 Mg Tabs (Cyclobenzaprine hcl) .Marland Kitchen... Three times a day as needed spasm    Vicodin Hp 10-660 Mg Tabs (Hydrocodone-acetaminophen) .Marland Kitchen... 1 q 6 hours as needed pain  Complete Medication List: 1)  Zoloft 100 Mg Tabs (Sertraline hcl) .... One by mouth daily as needed 2)  Premarin 1.25 Mg Tabs (Estrogens conjugated) .Marland Kitchen.. 1 once daily 3)  Vitamin D 16109 Unit Caps (Ergocalciferol) .... One by mouth weekly 4)  K-99 595 Mg Caps (Potassium gluconate) .... Once daily 5)  Multivitamins Tabs (Multiple vitamin) .... Once daily 6)  B-12 250 Mcg Tabs (Cyanocobalamin) .... Once daily 7)  Co Q-10 30-5 Mg-unit Caps (Coenzyme q10-vitamin e) .... Once daily 8)  Bayer Aspirin Ec Low Dose 81 Mg Tbec (Aspirin) .Marland Kitchen.. 1 once daily 9)  Bromfed 12-15 Mg Xr12h-cap (Brompheniramine-phenylephrine) .... One by mouth bid 10)  Freestyle Lite Test Strp (Glucose blood) .... Test cbg two times a day as directed 11)  Cimetidine 300 Mg Tabs (Cimetidine) .... One  by mouth two times a day 12)  Fexofenadine Hcl 180 Mg Tabs (Fexofenadine hcl) .... One by mouth daily 13)  Flonase 50 Mcg/act Susp (Fluticasone propionate) .... 2 sprays/ nostril daily 14)  Diflucan 150 Mg Tabs (Fluconazole) .Marland Kitchen.. 1 tab by mouth x 1; repeat in 3 days if needed 15)  Tussionex Pennkinetic Er 8-10 Mg/63ml Lqcr (Chlorpheniramine-hydrocodone) .Marland Kitchen.. 1 tsp two times a day as needed cough 16)  Flexeril 10 Mg Tabs (Cyclobenzaprine hcl) .... Three times a day as needed spasm 17)  Prednisone (pak) 10 Mg Tabs ( **Note De-Identified Brooking Obfuscation** Prednisone) .... As directed for 12 days 18)  Vicodin Hp 10-660 Mg Tabs (Hydrocodone-acetaminophen) .Marland Kitchen.. 1 q 6 hours as needed pain  Patient Instructions: 1)  Please schedule a follow-up appointment as needed .  Prescriptions: VICODIN HP 10-660 MG TABS (HYDROCODONE-ACETAMINOPHEN) 1 q 6 hours as needed pain  #60 x 0   Entered and Authorized by:   Nelwyn Salisbury MD   Signed by:   Nelwyn Salisbury MD on 03/18/2010   Method used:   Print then Give  to Patient   RxID:   [id number] PREDNISONE (PAK) 10 MG TABS (PREDNISONE) as directed for 12 days  #1 x 0   Entered and Authorized by:   Nelwyn Salisbury MD   Signed by:   Nelwyn Salisbury MD on 03/18/2010   Method used:   Print then Give to Patient   RxID:   [id number] FLEXERIL 10 MG TABS (CYCLOBENZAPRINE HCL) three times a day as needed spasm  #60 x 02   Entered and Authorized by:   Nelwyn Salisbury MD   Signed by:   Nelwyn Salisbury MD on 03/18/2010   Method used:   Print then Give to Patient   RxID:   [id number]

## 2010-07-18 NOTE — Progress Notes (Signed)
**Note De-Identified Silveira Obfuscation** Summary: Pt said Antivert should go to [facility]  Phone Note Call from Patient Call back at [phone] cell   Caller: Patient Summary of Call: Pt called and said that Antivert should have been sent to [facility] in [place] [phone] and fax# is [phone], not [place], [place]. Pls transfer med to correct pharmacy. The pharmacy closes at 5:30pm Initial call taken by: Lucy Antigua,  June 05, 2010 3:41 PM    Prescriptions: ANTIVERT 12.5 MG TABS (MECLIZINE HCL) one by mouth three times a day as needed  #60 x 0   Entered by:   Willy Eddy, LPN   Authorized by:   Stacie Glaze MD   Signed by:   Willy Eddy, LPN on 08/65/7846   Method used:   Telephoned to ...       [facility] (retail)             , [place]         Ph:        Fax: [phone]   RxID:   [id number]

## 2010-08-20 ENCOUNTER — Telehealth: Payer: Self-pay | Admitting: Internal Medicine

## 2010-08-20 MED ORDER — CHLORPHENIRAMINE-HYDROCODONE 8-10 MG/5ML PO LQCR: 5.0000 mL | Freq: Two times a day (BID) | ORAL | Status: DC | PRN

## 2010-08-20 MED ORDER — AZITHROMYCIN 250 MG PO TABS: ORAL_TABLET | ORAL | Status: AC

## 2010-08-20 NOTE — Telephone Encounter (Signed)
**Note De-Identified Lorusso Obfuscation** Pt has bronchitis needs abx and tussin cough med with codeine call into [facility] [phone]

## 2010-08-20 NOTE — Telephone Encounter (Signed)
**Note De-Identified Sleight Obfuscation** Call in zpack and tussionex 4 oz two tsp po BID

## 2010-08-23 ENCOUNTER — Telehealth: Payer: Self-pay | Admitting: Internal Medicine

## 2010-08-23 NOTE — Telephone Encounter (Signed)
**Note De-Identified Urquidi Obfuscation** Triage vm-----need status of cough syrup request from pharmacy. Tussinex. Call [facility]---ph--[phone].

## 2010-08-23 NOTE — Telephone Encounter (Signed)
**Note De-Identified Battaglia Obfuscation** Called into pharmacy from order 08-20-10

## 2010-08-27 ENCOUNTER — Encounter: Payer: Self-pay | Admitting: Internal Medicine

## 2010-08-28 ENCOUNTER — Ambulatory Visit (INDEPENDENT_AMBULATORY_CARE_PROVIDER_SITE_OTHER): Payer: BC Managed Care – PPO | Admitting: Internal Medicine

## 2010-08-28 ENCOUNTER — Encounter: Payer: Self-pay | Admitting: Internal Medicine

## 2010-08-28 VITALS — BP 130/80 | HR 108 | Temp 98.8°F | Resp 16 | Ht 64.0 in | Wt 172.0 lb

## 2010-08-28 DIAGNOSIS — N6089 Other benign mammary dysplasias of unspecified breast: Secondary | ICD-10-CM

## 2010-08-28 DIAGNOSIS — J209 Acute bronchitis, unspecified: Secondary | ICD-10-CM

## 2010-08-28 MED ORDER — PSEUDOEPH-CHLORPHEN-HYDROCOD 60-4-5 MG/5ML PO SOLN: 5.0000 mL | Freq: Three times a day (TID) | ORAL | Status: DC

## 2010-08-28 MED ORDER — AMOXICILLIN-POT CLAVULANATE 875-125 MG PO TABS: 1.0000 | ORAL_TABLET | Freq: Two times a day (BID) | ORAL | Status: AC

## 2010-08-28 NOTE — Progress Notes (Signed)
**Note De-identified Roldan Obfuscation**  **Note De-Identified Asper Obfuscation** Subjective:    Patient ID: Felicia Schultz, female    DOB: [birth year], 60 y.o.   MRN: [id number]  HPI patient is a 60 year old white female with a history of chronic bronchitis who presents with acute bronchitis symptoms hacking cough low-grade fever chills coughing. Times that leave her short of breath and wheezing.   she states that she has been sick for over 10 days has noticed a change in the color of her sputum. She also has a area of cellulitis on her left breast that occurred approximately a week ago this may have been precipitated by a scratch from one of her cats it is 3 cm in size tender to touch fluctuant erythematous almost purple in color    Review of Systems  Constitutional: Negative for activity change, appetite change and fatigue.  HENT: Positive for congestion, rhinorrhea, sneezing, postnasal drip and sinus pressure. Negative for ear pain and neck pain.   Eyes: Negative for redness and visual disturbance.  Respiratory: Positive for cough, shortness of breath and wheezing.   Gastrointestinal: Negative for abdominal pain and abdominal distention.  Genitourinary: Negative for dysuria, frequency and menstrual problem.  Musculoskeletal: Negative for myalgias, joint swelling and arthralgias.  Skin: Positive for color change and rash. Negative for wound.  Neurological: Negative for dizziness, weakness and headaches.  Hematological: Negative for adenopathy. Does not bruise/bleed easily.  Psychiatric/Behavioral: Negative for sleep disturbance and decreased concentration.   Past Medical History  Diagnosis Date  . Diabetes mellitus     type 2  . Depression   . Hypertension   . Hypokalemia    Past Surgical History  Procedure Date  . Total abdominal hysterectomy   . Gastric bypass     reports that she has never smoked. She does not have any smokeless tobacco history on file. She reports that she does not drink alcohol or use illicit drugs. family history includes Diabetes in her  mother; Heart disease in her father and mother; and Hypertension in her father. Allergies  Allergen Reactions  . Clarithromycin     REACTION: hives  . Nitrofurantoin   . Sulfonamide Derivatives        Objective:   Physical Exam  on physical examination she is a pleasant well-developed well-nourished white female in no apparent distress his blood pressure and vital signs are noted in the chart HEENT reveal pupils are equal round to light accommodation oropharynx or posterior cobblestoning turbinates were purple swollen and there was air flow obstruction examination of her lungs revealed clear bases but no wheezes in the upper lobes bilaterally right greater than left heart examination revealed a regular rate and rhythm abdomen is soft and nontender extremities exam revealed no cyanosis clubbing or edema neurologically she has equal grips and normal gait.       Assessment & Plan:

## 2010-08-28 NOTE — Assessment & Plan Note (Signed)
**Note De-Identified Matar Obfuscation** She has an infected sebaceous cyst on her  Left breast on the medial aspect is approximately 3 cm in size she states that she may recall a scratch from her cat a week prior to this infection developing.  It is tender to touch violaceous in color and fluctuant we will call in Augmentin 875 twice a day for 10 days and she will notify our office showed that the cellulitis not resolve

## 2010-09-06 NOTE — Telephone Encounter (Signed)
**Note De-identified Carl Obfuscation** Medications called to pharmacy. 

## 2010-09-09 ENCOUNTER — Ambulatory Visit (INDEPENDENT_AMBULATORY_CARE_PROVIDER_SITE_OTHER): Payer: BC Managed Care – PPO | Admitting: Internal Medicine

## 2010-09-09 ENCOUNTER — Encounter: Payer: Self-pay | Admitting: Internal Medicine

## 2010-09-09 VITALS — BP 140/84 | HR 72 | Temp 99.1°F | Resp 14

## 2010-09-09 DIAGNOSIS — N6089 Other benign mammary dysplasias of unspecified breast: Secondary | ICD-10-CM

## 2010-09-09 MED ORDER — CEFTRIAXONE SODIUM 1 G IJ SOLR
1.0000 g | INTRAMUSCULAR | Status: AC
  Administered 2010-09-09: 1 g via INTRAMUSCULAR

## 2010-09-09 MED ORDER — LEVOFLOXACIN 750 MG PO TABS: 750.0000 mg | ORAL_TABLET | Freq: Every day | ORAL | Status: AC

## 2010-09-09 NOTE — Progress Notes (Signed)
**Note De-identified Oguinn Obfuscation**  **Note De-Identified Wahlstrom Obfuscation** Subjective:    Patient ID: Felicia Schultz Adebayo, female    DOB: June 18, 1950, 60 y.o.   MRN: [id number]  HPI   patient presents with no response to a course of Augmentin for infected cyst on her right breast the cyst is nonfluctuant tender to touch and the cellulitis seems to be spreading  Review of Systems  Constitutional: Negative for activity change, appetite change and fatigue.  HENT: Negative for ear pain, congestion, neck pain, postnasal drip and sinus pressure.   Eyes: Negative for redness and visual disturbance.  Respiratory: Negative for cough, shortness of breath and wheezing.   Gastrointestinal: Negative for abdominal pain and abdominal distention.  Genitourinary: Negative for dysuria, frequency and menstrual problem.  Musculoskeletal: Negative for myalgias, joint swelling and arthralgias.  Skin: Negative for rash and wound.  Neurological: Negative for dizziness, weakness and headaches.  Hematological: Negative for adenopathy. Does not bruise/bleed easily.  Psychiatric/Behavioral: Negative for sleep disturbance and decreased concentration.       Objective:   Physical Exam  Constitutional: She is oriented to person, place, and time. She appears well-developed and well-nourished. No distress.  HENT:  Head: Normocephalic and atraumatic.  Right Ear: External ear normal.  Left Ear: External ear normal.  Nose: Nose normal.  Mouth/Throat: Oropharynx is clear and moist.  Eyes: Conjunctivae and EOM are normal. Pupils are equal, round, and reactive to light.  Neck: Normal range of motion. Neck supple. No JVD present. No tracheal deviation present. No thyromegaly present.  Cardiovascular: Normal rate, regular rhythm, normal heart sounds and intact distal pulses.   No murmur heard. Pulmonary/Chest: Effort normal and breath sounds normal. She has no wheezes. She exhibits no tenderness.        Breast examination reveals a 4 x 5 cm fluctuant area in the medial aspects of the right breast  erythematous with the fluid collection underneath the skin  Abdominal: Soft. Bowel sounds are normal.  Musculoskeletal: Normal range of motion. She exhibits no edema and no tenderness.  Lymphadenopathy:    She has no cervical adenopathy.  Neurological: She is alert and oriented to person, place, and time. She has normal reflexes. No cranial nerve deficit.  Skin: Skin is warm and dry. She is not diaphoretic.  Psychiatric: She has a normal mood and affect. Her behavior is normal.          Assessment & Plan:   Rocephin 1 g injected in the eye this urgent referral to surgery for I&D of this site the differential diagnosis is primarily an infected cyst but one can also worry about inflammatory carcinoma of the breast.  We will expand coverage with Levaquin and make an urgent referral to general surgery

## 2010-09-09 NOTE — Assessment & Plan Note (Signed)
**Note De-Identified Dershem Obfuscation** Breast cyst now become fluctuant to touch and the cellulitis has become more erythematous she has been on Augmentin 875 twice a day for a week without improvement we will give her a shot of Rocephin and change her to Levaquin 750 by mouth twice a day and refer her to Gen. Surgery at which time it should be able to lance this and drain it

## 2010-09-11 ENCOUNTER — Ambulatory Visit: Payer: BC Managed Care – PPO | Admitting: Internal Medicine

## 2010-10-07 ENCOUNTER — Encounter: Payer: Self-pay | Admitting: Internal Medicine

## 2010-10-07 ENCOUNTER — Ambulatory Visit (INDEPENDENT_AMBULATORY_CARE_PROVIDER_SITE_OTHER): Payer: BC Managed Care – PPO | Admitting: Internal Medicine

## 2010-10-07 DIAGNOSIS — J209 Acute bronchitis, unspecified: Secondary | ICD-10-CM

## 2010-10-07 DIAGNOSIS — E569 Vitamin deficiency, unspecified: Secondary | ICD-10-CM

## 2010-10-07 DIAGNOSIS — E559 Vitamin D deficiency, unspecified: Secondary | ICD-10-CM

## 2010-10-07 MED ORDER — ERGOCALCIFEROL 1.25 MG (50000 UT) PO CAPS: 50000.0000 [IU] | ORAL_CAPSULE | ORAL | Status: DC

## 2010-10-07 NOTE — Progress Notes (Signed)
**Note De-Identified [place] Obfuscation** Subjective:    Patient ID: Felicia Schultz, female    DOB: May 24, 1951, 60 y.o.   MRN: [id number]  HPI patient is a 60 year old white female who presents for followup of a breast cyst infected cyst was surgically I indeed had atrial surgeon's office and sent for culture she responded to the removal of the abscess and to the hepatic therapy with good healing.  She also presents for follow up on a vitamin D level which has been historically low the vitamin D is did increase with therapy but then resumed its levels after therapy ceased I believe she will need to be on chronic vitamin D therapy.  Patient also has history of nasal polyps which have been well with a nasal corticosteroid spray she should continue this as long-term prophylaxis. And finally the patient has a history of bariatric procedure which resulted in significant weight loss she has significant skin folds of her lower abdomen wall which become frequently infected and inflamed and cause a great deal of discomfort and pain    Review of Systems  Constitutional: Negative for activity change, appetite change and fatigue.  HENT: Negative for ear pain, congestion, neck pain, postnasal drip and sinus pressure.   Eyes: Negative for redness and visual disturbance.  Respiratory: Negative for cough, shortness of breath and wheezing.   Gastrointestinal: Negative for abdominal pain and abdominal distention.  Genitourinary: Negative for dysuria, frequency and menstrual problem.  Musculoskeletal: Negative for myalgias, joint swelling and arthralgias.  Skin: Negative for rash and wound.  Neurological: Negative for dizziness, weakness and headaches.  Hematological: Negative for adenopathy. Does not bruise/bleed easily.  Psychiatric/Behavioral: Negative for sleep disturbance and decreased concentration.   Past Medical History  Diagnosis Date  . Diabetes mellitus     type 2  . Depression   . Hypertension   . Hypokalemia    Past Surgical History   Procedure Date  . Total abdominal hysterectomy   . Gastric bypass     reports that she has never smoked. She does not have any smokeless tobacco history on file. She reports that she does not drink alcohol or use illicit drugs. family history includes Diabetes in her mother; Heart disease in her father and mother; and Hypertension in her father. Allergies  Allergen Reactions  . Clarithromycin     REACTION: hives  . Nitrofurantoin   . Sulfonamide Derivatives        Objective:   Physical Exam  Constitutional: She is oriented to person, place, and time. She appears well-developed and well-nourished. No distress.  HENT:  Head: Normocephalic and atraumatic.  Right Ear: External ear normal.  Left Ear: External ear normal.  Nose: Nose normal.  Mouth/Throat: Oropharynx is clear and moist.  Eyes: Conjunctivae and EOM are normal. Pupils are equal, round, and reactive to light.  Neck: Normal range of motion. Neck supple. No JVD present. No tracheal deviation present. No thyromegaly present.  Cardiovascular: Normal rate, regular rhythm, normal heart sounds and intact distal pulses.   No murmur heard. Pulmonary/Chest: Effort normal and breath sounds normal. She has no wheezes. She exhibits no tenderness.  Abdominal: Soft. Bowel sounds are normal.  Musculoskeletal: Normal range of motion. She exhibits no edema and no tenderness.  Lymphadenopathy:    She has no cervical adenopathy.  Neurological: She is alert and oriented to person, place, and time. She has normal reflexes. No cranial nerve deficit.  Skin: Skin is warm and dry. Rash noted. She is not diaphoretic. There is erythema. **Note De-Identified [place] Obfuscation** Psychiatric: She has a normal mood and affect. Her behavior is normal.          Assessment & Plan:  The patient's nasal polyps have responded to corticosteroids her breast cyst as described a surgical I&D and antibiotics we'll resume vitamin D 50,000 international units weekly the prescription has been sent  to her pharmacy for skinfold the only complete answer is to consider plastic surgery to remove the excess tissue as long as excess is in place to have recurrent inflammation and infection and discomfort this has been a chronic recurrent issue.

## 2011-02-10 ENCOUNTER — Ambulatory Visit (INDEPENDENT_AMBULATORY_CARE_PROVIDER_SITE_OTHER): Payer: BC Managed Care – PPO | Admitting: Internal Medicine

## 2011-02-10 VITALS — BP 130/80 | HR 72 | Temp 98.2°F | Resp 16 | Ht 64.0 in | Wt 174.0 lb

## 2011-02-10 DIAGNOSIS — T7840XA Allergy, unspecified, initial encounter: Secondary | ICD-10-CM

## 2011-02-10 DIAGNOSIS — J3089 Other allergic rhinitis: Secondary | ICD-10-CM

## 2011-02-10 DIAGNOSIS — Z9109 Other allergy status, other than to drugs and biological substances: Secondary | ICD-10-CM

## 2011-02-10 DIAGNOSIS — T887XXA Unspecified adverse effect of drug or medicament, initial encounter: Secondary | ICD-10-CM

## 2011-02-10 MED ORDER — AZITHROMYCIN 250 MG PO TABS: ORAL_TABLET | ORAL | Status: AC

## 2011-02-10 MED ORDER — FLUCONAZOLE 150 MG PO TABS: 150.0000 mg | ORAL_TABLET | Freq: Every day | ORAL | Status: AC

## 2011-02-10 NOTE — Patient Instructions (Addendum)
**Note De-Identified [place] Obfuscation** Avoid mold due to allergy symptoms and nasal polyps

## 2011-02-12 LAB — ~~LOC~~ ALLERGY PANEL
Allergen, Cedar tree, t12: <0.1 kU/L (ref <0.35)
Allergen, Comm Silver Birch, t9: <0.1 kU/L (ref <0.35)
Allergen, D pternoyssinus,d7: <0.1 kU/L (ref <0.35)
Allergen, Mulberry, t76: <0.1 kU/l (ref <0.35)
Alternaria Alternata: <0.1 kU/L (ref <0.35)
Aspergillus fumigatus, IgG: <0.1 kU/L (ref <0.35)
Bahia Grass: <0.1 kU/L (ref <0.35)
Bermuda Grass: <0.1 kU/L (ref <0.35)
Box Elder IgE: <0.1 kU/L (ref <0.35)
Cat Dander: <0.1 kU/L (ref <0.35)
Cladosporium Herbarum: <0.1 kU/L (ref <0.35)
Cockroach: <0.1 kU/L (ref <0.35)
Common Ragweed: 0.12 kU/L (ref <0.35)
D. farinae: <0.1 kU/L (ref <0.35)
Dog Dander: <0.1 kU/L (ref <0.35)
Elm IgE: <0.1 kU/L (ref <0.35)
Johnson Grass: <0.1 kU/L (ref <0.35)
Mucor Racemosus: <0.1 kU/L (ref <0.35)
Mugwort: <0.1 kU/L (ref <0.35)
Nettle: <0.1 kU/L (ref <0.35)
Oak: <0.1 kU/L (ref <0.35)
Pecan/Hickory Tree IgE: <0.1 kU/L (ref <0.35)
Penicillium Notatum: <0.1 kU/L (ref <0.35)
Plantain: <0.1 kU/L (ref <0.35)
Rough Pigweed  IgE: <0.1 kU/L (ref <0.35)
Sheep Sorrel IgE: <0.1 kU/L (ref <0.35)
Stemphylium Botryosum: <0.1 kU/L (ref <0.35)
Sweet Gum: <0.1 kU/L (ref <0.35)
Timothy Grass: <0.1 kU/L (ref <0.35)

## 2011-02-16 ENCOUNTER — Encounter: Payer: Self-pay | Admitting: Internal Medicine

## 2011-02-16 NOTE — Progress Notes (Signed)
**Note De-Identified [place] Obfuscation** Subjective:    Patient ID: Felicia Schultz Pardon, female    DOB: Dec 29, 1950, 60 y.o.   MRN: [id number]  HPI Patient is a 60 year old white female with a history of nasal polyps and severe allergic rhinitis who states that she has had severe symptomology consistent with nasal polyps over the past few weeks.  This was following an upper respiratory tract infection and severe allergic rhinitis.  She also has had a history of acute maxillary sinusitis.  She denies any respiratory distress she is breathing well she states that now she has persistent postnasal drip and states she's been compliant with her medications which included nasal corticosteroid.  She denies any bloody discharge or sores from her nostril she states that she doesn't like to use nasal steroids consistently because she developed ulcers on the sides of her   Review of Systems  Constitutional: Negative for activity change, appetite change and fatigue.  HENT: Positive for nosebleeds, congestion and rhinorrhea. Negative for ear pain, neck pain, postnasal drip and sinus pressure.   Eyes: Negative for redness and visual disturbance.  Respiratory: Negative for cough, shortness of breath and wheezing.   Gastrointestinal: Negative for abdominal pain and abdominal distention.  Genitourinary: Negative for dysuria, frequency and menstrual problem.  Musculoskeletal: Negative for myalgias, joint swelling and arthralgias.  Skin: Negative for rash and wound.  Neurological: Negative for dizziness, weakness and headaches.  Hematological: Negative for adenopathy. Does not bruise/bleed easily.  Psychiatric/Behavioral: Negative for sleep disturbance and decreased concentration.   Past Medical History  Diagnosis Date  . Diabetes mellitus     type 2  . Depression   . Hypertension   . Hypokalemia    Past Surgical History  Procedure Date  . Total abdominal hysterectomy   . Gastric bypass     reports that she has never smoked. She does not have  any smokeless tobacco history on file. She reports that she does not drink alcohol or use illicit drugs. family history includes Diabetes in her mother; Heart disease in her father and mother; and Hypertension in her father. Allergies  Allergen Reactions  . Clarithromycin     REACTION: hives  . Nitrofurantoin   . Sulfonamide Derivatives        Objective:   Physical Exam  Constitutional: She is oriented to person, place, and time. She appears well-developed and well-nourished. No distress.  HENT:  Head: Normocephalic and atraumatic.  Right Ear: External ear normal.  Left Ear: External ear normal.  Nose: Nose normal.  Mouth/Throat: Oropharynx is clear and moist.  Eyes: Conjunctivae and EOM are normal. Pupils are equal, round, and reactive to light.       Nasal polyps detected with chronic erythema and enlargement of the turbinates bilaterally  Neck: Normal range of motion. Neck supple. No JVD present. No tracheal deviation present. No thyromegaly present.  Cardiovascular: Normal rate, regular rhythm, normal heart sounds and intact distal pulses.   No murmur heard. Pulmonary/Chest: Effort normal and breath sounds normal. She has no wheezes. She exhibits no tenderness.  Abdominal: Soft. Bowel sounds are normal.  Musculoskeletal: Normal range of motion. She exhibits no edema and no tenderness.  Lymphadenopathy:    She has no cervical adenopathy.  Neurological: She is alert and oriented to person, place, and time. She has normal reflexes. No cranial nerve deficit.  Skin: Skin is warm and dry. She is not diaphoretic.  Psychiatric: She has a normal mood and affect. Her behavior is normal. **Note De-Identified [place] Obfuscation** Assessment & Plan:  Discussed changing her nasal steroid to power spray nasal steroid with an adapter that would allow the spray to go past the opening of the nares and prevent the nasal ulcerations but also penetrates sinuses are much more effective way samples of this medication were given  to patient she will use over the next 2 months and evaluate.  Discussion allergy testing to try to begin some avoidance therapy for some of the more severe allergies a possible discussion referral to an allergist for formal testing as well as immunomodulation therapy

## 2011-04-08 ENCOUNTER — Ambulatory Visit: Payer: BC Managed Care – PPO | Admitting: Internal Medicine

## 2011-04-21 ENCOUNTER — Ambulatory Visit (INDEPENDENT_AMBULATORY_CARE_PROVIDER_SITE_OTHER): Payer: BC Managed Care – PPO | Admitting: Internal Medicine

## 2011-04-21 ENCOUNTER — Encounter: Payer: Self-pay | Admitting: Internal Medicine

## 2011-04-21 VITALS — BP 144/82 | HR 76 | Temp 98.6°F | Resp 16 | Ht 64.0 in | Wt 174.0 lb

## 2011-04-21 DIAGNOSIS — J019 Acute sinusitis, unspecified: Secondary | ICD-10-CM | POA: Insufficient documentation

## 2011-04-21 DIAGNOSIS — I10 Essential (primary) hypertension: Secondary | ICD-10-CM

## 2011-04-21 DIAGNOSIS — R51 Headache: Secondary | ICD-10-CM

## 2011-04-21 DIAGNOSIS — M899 Disorder of bone, unspecified: Secondary | ICD-10-CM

## 2011-04-21 DIAGNOSIS — E559 Vitamin D deficiency, unspecified: Secondary | ICD-10-CM

## 2011-04-21 DIAGNOSIS — M949 Disorder of cartilage, unspecified: Secondary | ICD-10-CM

## 2011-04-21 LAB — CBC WITH DIFFERENTIAL/PLATELET
Basophils Absolute: 0 10*3/uL (ref 0.0–0.1)
Basophils Relative: 0.4 % (ref 0.0–3.0)
Eosinophils Absolute: 0.1 10*3/uL (ref 0.0–0.7)
Eosinophils Relative: 1.1 % (ref 0.0–5.0)
HCT: 37 % (ref 36.0–46.0)
Hemoglobin: 12.5 g/dL (ref 12.0–15.0)
Lymphocytes Relative: 26.6 % (ref 12.0–46.0)
Lymphs Abs: 1.6 10*3/uL (ref 0.7–4.0)
MCHC: 33.8 g/dL (ref 30.0–36.0)
MCV: 95 fl (ref 78.0–100.0)
Monocytes Absolute: 0.3 10*3/uL (ref 0.1–1.0)
Monocytes Relative: 5.9 % (ref 3.0–12.0)
Neutro Abs: 3.9 10*3/uL (ref 1.4–7.7)
Neutrophils Relative %: 66 % (ref 43.0–77.0)
Platelets: 205 10*3/uL (ref 150.0–400.0)
RBC: 3.89 Mil/uL (ref 3.87–5.11)
RDW: 13.3 % (ref 11.5–14.6)
WBC: 5.8 10*3/uL (ref 4.5–10.5)

## 2011-04-21 MED ORDER — LEVOFLOXACIN 500 MG PO TABS: 500.0000 mg | ORAL_TABLET | Freq: Every day | ORAL | Status: AC

## 2011-04-21 NOTE — Progress Notes (Signed)
**Note De-Identified [place] Obfuscation** Subjective:    Patient ID: Felicia Schultz, female    DOB: 05/06/1951, 60 y.o.   MRN: [id number]  HPI Presents for followup vitamin D.  She has a complaint of sinusitis with sinus congestion fever and green discharge.  She states that the primary precipitant of her sinus infection has been allergies that have been exacerbated at work she states that several members of 13 have suffered sinus infections do to what is believed to be fungus in there and her doxepin  She has a headache associated with this and fatigue   Review of Systems  Constitutional: Negative for activity change, appetite change and fatigue.  HENT: Negative for ear pain, congestion, neck pain, postnasal drip and sinus pressure.   Eyes: Negative for redness and visual disturbance.  Respiratory: Negative for cough, shortness of breath and wheezing.   Gastrointestinal: Negative for abdominal pain and abdominal distention.  Genitourinary: Negative for dysuria, frequency and menstrual problem.  Musculoskeletal: Negative for myalgias, joint swelling and arthralgias.  Skin: Negative for rash and wound.  Neurological: Negative for dizziness, weakness and headaches.  Hematological: Negative for adenopathy. Does not bruise/bleed easily.  Psychiatric/Behavioral: Negative for sleep disturbance and decreased concentration.   Past Medical History  Diagnosis Date  . Diabetes mellitus     type 2  . Depression   . Hypertension   . Hypokalemia    Past Surgical History  Procedure Date  . Total abdominal hysterectomy   . Gastric bypass     reports that she has never smoked. She does not have any smokeless tobacco history on file. She reports that she does not drink alcohol or use illicit drugs. family history includes Diabetes in her mother; Heart disease in her father and mother; and Hypertension in her father. Allergies  Allergen Reactions  . Clarithromycin     REACTION: hives  . Nitrofurantoin   . Sulfonamide Derivatives          Objective:   Physical Exam  Nursing note and vitals reviewed. Constitutional: She is oriented to person, place, and time. She appears well-developed and well-nourished. No distress.  HENT:  Head: Normocephalic and atraumatic.  Right Ear: External ear normal.  Left Ear: External ear normal.  Nose: Nose normal.  Mouth/Throat: Oropharynx is clear and moist.  Eyes: Conjunctivae and EOM are normal. Pupils are equal, round, and reactive to light.       Sinus erythematous with swollen turbinates and posterior pharynx with cobblestoning  Neck: Normal range of motion. Neck supple. No JVD present. No tracheal deviation present. No thyromegaly present.  Cardiovascular: Normal rate, regular rhythm, normal heart sounds and intact distal pulses.   No murmur heard. Pulmonary/Chest: Effort normal and breath sounds normal. She has no wheezes. She exhibits no tenderness.  Abdominal: Soft. Bowel sounds are normal.  Musculoskeletal: Normal range of motion. She exhibits no edema and no tenderness.  Lymphadenopathy:    She has no cervical adenopathy.  Neurological: She is alert and oriented to person, place, and time. She has normal reflexes. No cranial nerve deficit.  Skin: Skin is warm and dry. She is not diaphoretic.  Psychiatric: She has a normal mood and affect. Her behavior is normal.          Assessment & Plan:  Patient has acute sinusitis will treat with Levaquin 500 milligrams by mouth daily for 10 days she will also take Nasonex 2 sprays in each nostril twice daily for 2 weeks.  The patient has elevated blood pressure secondary to **Note De-Identified [place] Obfuscation** the headache she is experiencing today she will monitor her blood pressure after resolution of her headache and make sure that it returns to normal if it does not she'll contact our office.  She has vitamin D deficiency we will monitor vitamin D and adjust supplements as needed to keep her vitamin D between 40 and 50

## 2011-04-21 NOTE — Patient Instructions (Signed)
**Note De-identified [place] Obfuscation** The patient is instructed to continue all medications as prescribed. Schedule followup with check out clerk upon leaving the clinic  

## 2011-04-22 LAB — VITAMIN D 25 HYDROXY (VIT D DEFICIENCY, FRACTURES): Vit D, 25-Hydroxy: 24 ng/mL — ABNORMAL LOW (ref 30–89)

## 2011-05-06 ENCOUNTER — Other Ambulatory Visit: Payer: Self-pay | Admitting: Neurosurgery

## 2011-05-06 DIAGNOSIS — M542 Cervicalgia: Secondary | ICD-10-CM

## 2011-05-20 ENCOUNTER — Ambulatory Visit
Admission: RE | Admit: 2011-05-20 | Discharge: 2011-05-20 | Disposition: A | Discharge status: Home / Self Care | Payer: BC Managed Care – PPO | Source: Ambulatory Visit | Attending: Neurosurgery | Admitting: Neurosurgery

## 2011-05-20 DIAGNOSIS — M542 Cervicalgia: Secondary | ICD-10-CM

## 2011-07-10 ENCOUNTER — Other Ambulatory Visit: Payer: Self-pay | Admitting: Internal Medicine

## 2011-07-10 MED ORDER — LEVOFLOXACIN 500 MG PO TABS: 500.0000 mg | ORAL_TABLET | Freq: Every day | ORAL | Status: AC

## 2011-07-10 NOTE — Telephone Encounter (Signed)
**Note De-Identified [place] [facility]bfuscation** Please let pt know it was called in- ok per dr Lovell Sheehan

## 2011-07-10 NOTE — Telephone Encounter (Signed)
**Note De-Identified [place] Obfuscation** Pt called and has another sinus inf. Pt is req refill of levofloxacin (LEVAQUIN) 500 MG tablet to be called in to [facility] in [place] [phone]

## 2011-07-10 NOTE — Telephone Encounter (Signed)
**Note De-identified [place] Obfuscation** Pt. Notified.

## 2011-07-28 ENCOUNTER — Other Ambulatory Visit: Payer: Self-pay | Admitting: *Deleted

## 2011-07-28 MED ORDER — ESTROGENS CONJUGATED 1.25 MG PO TABS: 1.2500 mg | ORAL_TABLET | Freq: Every day | ORAL | Status: DC

## 2011-08-06 ENCOUNTER — Ambulatory Visit: Payer: BC Managed Care – PPO | Admitting: Internal Medicine

## 2011-10-17 ENCOUNTER — Other Ambulatory Visit: Payer: Self-pay | Admitting: *Deleted

## 2011-10-17 MED ORDER — MOMETASONE FUROATE 50 MCG/ACT NA SUSP: 2.0000 | Freq: Every day | NASAL | Status: DC

## 2011-10-20 ENCOUNTER — Ambulatory Visit (INDEPENDENT_AMBULATORY_CARE_PROVIDER_SITE_OTHER): Payer: BC Managed Care – PPO | Admitting: Family

## 2011-10-20 ENCOUNTER — Encounter: Payer: Self-pay | Admitting: Family

## 2011-10-20 VITALS — BP 130/82 | Temp 98.5°F | Wt 175.0 lb

## 2011-10-20 DIAGNOSIS — F32A Depression, unspecified: Secondary | ICD-10-CM

## 2011-10-20 DIAGNOSIS — J019 Acute sinusitis, unspecified: Secondary | ICD-10-CM

## 2011-10-20 DIAGNOSIS — F329 Major depressive disorder, single episode, unspecified: Secondary | ICD-10-CM

## 2011-10-20 DIAGNOSIS — J309 Allergic rhinitis, unspecified: Secondary | ICD-10-CM

## 2011-10-20 DIAGNOSIS — E559 Vitamin D deficiency, unspecified: Secondary | ICD-10-CM

## 2011-10-20 DIAGNOSIS — R42 Dizziness and giddiness: Secondary | ICD-10-CM

## 2011-10-20 MED ORDER — SERTRALINE HCL 100 MG PO TABS: 100.0000 mg | ORAL_TABLET | Freq: Every day | ORAL | Status: DC | PRN

## 2011-10-20 MED ORDER — VITAMIN B-12 250 MCG PO TABS: 250.0000 ug | ORAL_TABLET | Freq: Every day | ORAL | Status: DC

## 2011-10-20 MED ORDER — ESTROGENS CONJUGATED 1.25 MG PO TABS: 1.2500 mg | ORAL_TABLET | Freq: Every day | ORAL | Status: DC

## 2011-10-20 MED ORDER — LEVOFLOXACIN 500 MG PO TABS: 500.0000 mg | ORAL_TABLET | Freq: Every day | ORAL | Status: AC

## 2011-10-20 MED ORDER — ERGOCALCIFEROL 1.25 MG (50000 UT) PO CAPS: 50000.0000 [IU] | ORAL_CAPSULE | ORAL | Status: DC

## 2011-10-20 MED ORDER — NABUMETONE 500 MG PO TABS: 500.0000 mg | ORAL_TABLET | Freq: Two times a day (BID) | ORAL | Status: DC

## 2011-10-20 MED ORDER — LEVOCETIRIZINE DIHYDROCHLORIDE 5 MG PO TABS: 5.0000 mg | ORAL_TABLET | Freq: Every evening | ORAL | Status: DC

## 2011-10-20 NOTE — Progress Notes (Signed)
**Note De-Identified [place] [facility]bfuscation** Subjective:    Patient ID: Felicia Schultz, female    DOB: 05-23-1951, 61 y.o.   MRN: [id number]  HPI 61 year old white female, nonsmoker, patient of Dr. Lovell Sheehan is in today with complaints of hoarseness, nasal polyps, sneezing, cough, congestion, and postnasal drip x3 weeks. Recently has been experiencing dizziness. She typically takes Nasonex doesn't appear to be helping her symptoms. In the past she's been on several allergy medications to include Clarinex that has not been effective. Patient is also requesting a refill on vitamin D, Zoloft, Relafen, and vitamin B12. She reports a stable amount of her medications and this will well.  Patient has a long-standing history of sinusitis.  Review of Systems  Constitutional: Negative.   HENT: Positive for congestion, sore throat, rhinorrhea, sneezing and postnasal drip.   Eyes: Negative.   Respiratory: Positive for cough. Negative for shortness of breath and wheezing.   Cardiovascular: Negative.   Musculoskeletal: Negative.   Skin: Negative.   Neurological: Positive for dizziness.       Feels off-balance  Hematological: Negative.   Psychiatric/Behavioral: Negative.    Past Medical History  Diagnosis Date  . Diabetes mellitus     type 2  . Depression   . Hypertension   . Hypokalemia     History   Social History  . Marital Status: Married    Spouse Name: N/A    Number of Children: N/A  . Years of Education: N/A   Occupational History  . Not on file.   Social History Main Topics  . Smoking status: Never Smoker   . Smokeless tobacco: Not on file  . Alcohol Use: No  . Drug Use: No  . Sexually Active: Yes   Other Topics Concern  . Not on file   Social History Narrative  . No narrative on file    Past Surgical History  Procedure Date  . Total abdominal hysterectomy   . Gastric bypass     Family History  Problem Relation Age of Onset  . Diabetes Mother   . Heart disease Mother   . Heart disease Father   .  Hypertension Father     Allergies  Allergen Reactions  . Clarithromycin     REACTION: hives  . Nitrofurantoin   . Sulfonamide Derivatives     Current Outpatient Prescriptions on File Prior to Visit  Medication Sig Dispense Refill  . desloratadine (CLARINEX) 5 MG tablet Take 5 mg by mouth daily.        . fluticasone (FLONASE) 50 MCG/ACT nasal spray 2 sprays by Nasal route daily.        . mometasone (NASONEX) 50 MCG/ACT nasal spray Place 2 sprays into the nose daily.  17 g  6  . Multiple Vitamin (MULTIVITAMIN) tablet Take 1 tablet by mouth daily.        Marland Kitchen DISCONTD: estrogens, conjugated, (PREMARIN) 1.25 MG tablet Take 1 tablet (1.25 mg total) by mouth daily.  30 tablet  6  . DISCONTD: sertraline (ZOLOFT) 100 MG tablet Take 100 mg by mouth daily as needed.        Marland Kitchen levocetirizine (XYZAL) 5 MG tablet Take 1 tablet (5 mg total) by mouth every evening.  30 tablet  4    BP 130/82  Temp(Src) 98.5 F (36.9 C) (Oral)  Wt 175 lb (79.379 kg)chart    Objective:   Physical Exam  Constitutional: She is oriented to person, place, and time. She appears well-developed and well-nourished.  HENT:  Right Ear: External ear **Note De-Identified Brian Obfuscation** normal.  Left Ear: External ear normal.  Nose: Nose normal.  Mouth/Throat: Oropharynx is clear and moist.  Neck: Normal range of motion. Neck supple.  Cardiovascular: Normal rate, regular rhythm and normal heart sounds.   Pulmonary/Chest: Effort normal and breath sounds normal.  Abdominal: Soft. Bowel sounds are normal.  Musculoskeletal: Normal range of motion.  Neurological: She is alert and oriented to person, place, and time.  Skin: Skin is warm.  Psychiatric: She has a normal mood and affect.          Assessment & Plan:  Assessment: Acute sinusitis, allergic rhinitis, vitamin D deficiency, depression  Plan: Meds refilled. Levaquin 5 mg once a day x7 days. Xyzal 5 mg once a day. Patient to follow with ENT as scheduled this month. Rest. Drink plenty of fluids. Call  the office if symptoms worsen or persist. Recheck a schedule, and when necessary.

## 2011-10-20 NOTE — Patient Instructions (Signed)
**Note De-identified [place] Obfuscation** Sinusitis Sinuses are air pockets within the bones of your face. The growth of bacteria within a sinus leads to infection. The infection prevents the sinuses from draining. This infection is called sinusitis. SYMPTOMS  There will be different areas of pain depending on which sinuses have become infected.  The maxillary sinuses often produce pain beneath the eyes.   Frontal sinusitis may cause pain in the middle of the forehead and above the eyes.  Other problems (symptoms) include:  Toothaches.   Colored, pus-like (purulent) drainage from the nose.   Swelling, warmth, and tenderness over the sinus areas may be signs of infection.  TREATMENT  Sinusitis is most often determined by an exam.X-rays may be taken. If x-rays have been taken, make sure you obtain your results or find out how you are to obtain them. Your caregiver may give you medications (antibiotics). These are medications that will help kill the bacteria causing the infection. You may also be given a medication (decongestant) that helps to reduce sinus swelling.  HOME CARE INSTRUCTIONS   Only take over-the-counter or prescription medicines for pain, discomfort, or fever as directed by your caregiver.   Drink extra fluids. Fluids help thin the mucus so your sinuses can drain more easily.   Applying either moist heat or ice packs to the sinus areas may help relieve discomfort.   Use saline nasal sprays to help moisten your sinuses. The sprays can be found at your local drugstore.  SEEK IMMEDIATE MEDICAL CARE IF:  You have a fever.   You have increasing pain, severe headaches, or toothache.   You have nausea, vomiting, or drowsiness.   You develop unusual swelling around the face or trouble seeing.  MAKE SURE YOU:   Understand these instructions.   Will watch your condition.   Will get help right away if you are not doing well or get worse.  Document Released: 06/02/2005 Document Revised: 05/22/2011 Document Reviewed:  12/30/2006 Exit[employer] Patient Information 2012 [employer]. 

## 2011-11-11 DIAGNOSIS — J329 Chronic sinusitis, unspecified: Secondary | ICD-10-CM | POA: Insufficient documentation

## 2011-11-11 DIAGNOSIS — J309 Allergic rhinitis, unspecified: Secondary | ICD-10-CM | POA: Insufficient documentation

## 2012-01-09 ENCOUNTER — Encounter: Payer: Self-pay | Admitting: Internal Medicine

## 2012-01-09 ENCOUNTER — Telehealth: Payer: Self-pay | Admitting: Internal Medicine

## 2012-01-09 ENCOUNTER — Ambulatory Visit (INDEPENDENT_AMBULATORY_CARE_PROVIDER_SITE_OTHER): Payer: BC Managed Care – PPO | Admitting: Internal Medicine

## 2012-01-09 VITALS — BP 144/86 | HR 105 | Temp 98.7°F | Wt 170.0 lb

## 2012-01-09 DIAGNOSIS — M791 Myalgia, unspecified site: Secondary | ICD-10-CM | POA: Insufficient documentation

## 2012-01-09 DIAGNOSIS — R5383 Other fatigue: Secondary | ICD-10-CM | POA: Insufficient documentation

## 2012-01-09 DIAGNOSIS — E569 Vitamin deficiency, unspecified: Secondary | ICD-10-CM

## 2012-01-09 DIAGNOSIS — IMO0001 Reserved for inherently not codable concepts without codable children: Secondary | ICD-10-CM

## 2012-01-09 DIAGNOSIS — I10 Essential (primary) hypertension: Secondary | ICD-10-CM

## 2012-01-09 DIAGNOSIS — E538 Deficiency of other specified B group vitamins: Secondary | ICD-10-CM

## 2012-01-09 DIAGNOSIS — R5381 Other malaise: Secondary | ICD-10-CM

## 2012-01-09 LAB — CK: Total CK: 49 U/L (ref 7–177)

## 2012-01-09 LAB — POCT URINALYSIS DIP (MANUAL ENTRY)
Bilirubin, UA: NEGATIVE
Glucose, UA: NEGATIVE
Ketones, POC UA: NEGATIVE
Leukocytes, UA: NEGATIVE
Nitrite, UA: NEGATIVE
Protein Ur, POC: NEGATIVE
Spec Grav, UA: 1.02
Urobilinogen, UA: 0.2
pH, UA: 7

## 2012-01-09 LAB — CBC WITH DIFFERENTIAL/PLATELET
Basophils Absolute: 0 10*3/uL (ref 0.0–0.1)
Basophils Relative: 0.6 % (ref 0.0–3.0)
Eosinophils Absolute: 0.1 10*3/uL (ref 0.0–0.7)
Eosinophils Relative: 1.2 % (ref 0.0–5.0)
HCT: 37.6 % (ref 36.0–46.0)
Hemoglobin: 12.6 g/dL (ref 12.0–15.0)
Lymphocytes Relative: 30.2 % (ref 12.0–46.0)
Lymphs Abs: 1.9 10*3/uL (ref 0.7–4.0)
MCHC: 33.4 g/dL (ref 30.0–36.0)
MCV: 93.3 fl (ref 78.0–100.0)
Monocytes Absolute: 0.3 10*3/uL (ref 0.1–1.0)
Monocytes Relative: 4.9 % (ref 3.0–12.0)
Neutro Abs: 3.9 10*3/uL (ref 1.4–7.7)
Neutrophils Relative %: 63.1 % (ref 43.0–77.0)
Platelets: 179 10*3/uL (ref 150.0–400.0)
RBC: 4.04 Mil/uL (ref 3.87–5.11)
RDW: 13.9 % (ref 11.5–14.6)
WBC: 6.2 10*3/uL (ref 4.5–10.5)

## 2012-01-09 LAB — COMPREHENSIVE METABOLIC PANEL
ALT: 16 U/L (ref 0–35)
AST: 19 U/L (ref 0–37)
Albumin: 4 g/dL (ref 3.5–5.2)
Alkaline Phosphatase: 84 U/L (ref 39–117)
BUN: 18 mg/dL (ref 6–23)
CO2: 28 mEq/L (ref 19–32)
Calcium: 9 mg/dL (ref 8.4–10.5)
Chloride: 106 mEq/L (ref 96–112)
Creatinine, Ser: 0.8 mg/dL (ref 0.4–1.2)
GFR: 81.61 mL/min (ref >60.00)
Glucose, Bld: 89 mg/dL (ref 70–99)
Potassium: 3.8 mEq/L (ref 3.5–5.1)
Sodium: 141 mEq/L (ref 135–145)
Total Bilirubin: 0.6 mg/dL (ref 0.3–1.2)
Total Protein: 6.8 g/dL (ref 6.0–8.3)

## 2012-01-09 LAB — TSH: TSH: 1.5 u[IU]/mL (ref 0.35–5.50)

## 2012-01-09 LAB — FERRITIN: Ferritin: 16.9 ng/mL (ref 10.0–291.0)

## 2012-01-09 LAB — VITAMIN B12: Vitamin B-12: 559 pg/mL (ref 211–911)

## 2012-01-09 LAB — SEDIMENTATION RATE: Sed Rate: 10 mm/hr (ref 0–22)

## 2012-01-09 NOTE — Telephone Encounter (Signed)
**Note De-identified [place] Obfuscation** Dr jenkins is aware 

## 2012-01-09 NOTE — Progress Notes (Signed)
**Note De-Identified [place] Obfuscation** Subjective:    Patient ID: Felicia Schultz, female    DOB: Dec 13, 1950, 61 y.o.   MRN: [id number]  HPI Patient comes in today for SDA for  new problem evaluation. From [facility] PCP booked . And felt to be an emergency. Problem with weak and tired feeling for 4-5 weeks and recently feeling worse.  Today hard to lift legs to get up stairs.  But no numbness falling  Swallowing problem.  Has hx of low vit d and on high bit d supp.  Takes 2 /day of b12 .  Sleeps  Still feels tired.  No OSA  Takes low dose zoloft about 1/4 100 for anxiety was on higher dose after a death in family.  Takes zyzal at night to help with drowsiness. NO new meds and no meds for sx.   Outpatient Encounter Prescriptions as of 01/09/2012  Medication Sig Dispense Refill  . ergocalciferol (VITAMIN D2) 50000 UNITS capsule Take 1 capsule (50,000 Units total) by mouth once a week.  4 capsule  12  . estrogens, conjugated, (PREMARIN) 1.25 MG tablet Take 1 tablet (1.25 mg total) by mouth daily.  30 tablet  6  . fluticasone (FLONASE) 50 MCG/ACT nasal spray 2 sprays by Nasal route daily.        Marland Kitchen levocetirizine (XYZAL) 5 MG tablet Take 1 tablet (5 mg total) by mouth every evening.  30 tablet  4  . mometasone (NASONEX) 50 MCG/ACT nasal spray Place 2 sprays into the nose daily.  17 g  6  . Multiple Vitamin (MULTIVITAMIN) tablet Take 1 tablet by mouth daily.        . nabumetone (RELAFEN) 500 MG tablet Take 1 tablet (500 mg total) by mouth 2 (two) times daily.  60 tablet  4  . omeprazole (PRILOSEC) 20 MG capsule Take 20 mg by mouth daily.      . sertraline (ZOLOFT) 100 MG tablet Take 1 tablet (100 mg total) by mouth daily as needed.  30 tablet  4  . vitamin B-12 (CYANOCOBALAMIN) 250 MCG tablet Take 1 tablet (250 mcg total) by mouth daily.  30 tablet  4  . DISCONTD: desloratadine (CLARINEX) 5 MG tablet Take 5 mg by mouth daily.        hx pf cat bite  Months ago no feer chills rash with this was on finger   Review of Systems Neg cp sob cough  syncope rash new HA  Joint swelling  Bleeding  Uri  Rest as per hpi  Past history family history social history reviewed in the electronic medical record.     Objective:   Physical Exam BP 144/86  Pulse 105  Temp 98.7 F (37.1 C)  Wt 170 lb (77.111 kg)  SpO2 99% HEENT: Normocephalic ;atraumatic , Eyes;  PERRL, EOMs  Full, lids and conjunctiva clear,,Ears: no deformities, canals nl, TM landmarks normal, Nose: no deformity or discharge  Mouth : OP clear without lesion or edema . Neck: Supple without adenopathy or masses or bruits Chest:  Clear to A&P without wheezes rales or rhonchi CV:  S1-S2 no gallops or murmurs peripheral perfusion is normal Abdomen:  Sof,t normal bowel sounds without hepatosplenomegaly, no guarding rebound or masses no CVA tenderness NEURO: oriented x 3 CN 3-12 appear intact. No focal muscle weakness or atrophy. DTRs symmetrical. Gait WNL.  Grossly non focal. No tremor or abnormal movement. No clubbing cyanosis or edema Wt Readings from Last 3 Encounters:  01/09/12 170 lb (77.111 kg)  10/20/11 175 lb ( **Note De-Identified Manzella Obfuscation** 79.379 kg)  04/21/11 174 lb (78.926 kg)      Assessment & Plan:   Weak feeling non specific  R/o myositis   Vit toxicity or deficiency med effect.  Non focal exam  On vits  Hx of vit d low  Allergy HRT  Moods on rx no change  HT up slightly today   Get labs and fu with PCP or as directed by lab.

## 2012-01-09 NOTE — Telephone Encounter (Signed)
**Note De-Identified [place] Obfuscation** Caller: Felicia Schultz/Patient; PCP: Darryll Capers; CB#: [phone]; Call regarding Bone and Joint Pain; sx started 5-6 months ago but for the last 1 month sx are worsening; she can barely walk without feeling this discomfortl she has a hx of gastric bypass and Vitamin D deficiency; she takes 50,000U/week of Vit D; pain is worst "all over;" neck and shoulders are worse now; she states that she has weakness but still can move all parts; fees tired all the time and can just fall asleep; Triaged per Weakness or Paralysis Guideline; See in ED Immed d/t new onset of genralized weakness; pt requesting OV over ER; appt made with Dr Fabian Sharp at 11:30am today; 11:30 is the soonest pt can be at the office d/t she is a work and drive time to the office

## 2012-01-09 NOTE — Patient Instructions (Signed)
**Note De-Identified Krizek Obfuscation** Your exam is good today however we need to check laboratory studies to check for your vitamin levels. We'll notify you of these results and plan followup with Dr. Lovell Sheehan.  If something gets immediately worse would have you call the on-call service for further advice.

## 2012-01-10 LAB — VITAMIN D 25 HYDROXY (VIT D DEFICIENCY, FRACTURES): Vit D, 25-Hydroxy: 24 ng/mL — ABNORMAL LOW (ref 30–89)

## 2012-01-11 LAB — ALDOLASE: Aldolase: 4.5 U/L (ref <=8.1)

## 2012-01-15 LAB — CELIAC PANEL 10
Endomysial Screen: NEGATIVE
Gliadin IgA: 10.5 U/mL (ref <20)
Gliadin IgG: 8.6 U/mL (ref <20)
IgA: 216 mg/dL (ref 69–380)
Tissue Transglut Ab: 5.8 U/mL (ref <20)
Tissue Transglutaminase Ab, IgA: 3.9 U/mL (ref <20)

## 2012-01-28 ENCOUNTER — Encounter: Payer: Self-pay | Admitting: Internal Medicine

## 2012-01-28 ENCOUNTER — Ambulatory Visit (INDEPENDENT_AMBULATORY_CARE_PROVIDER_SITE_OTHER): Payer: BC Managed Care – PPO | Admitting: Internal Medicine

## 2012-01-28 VITALS — BP 160/100 | HR 76 | Temp 98.0°F | Resp 16 | Ht 64.0 in | Wt 170.0 lb

## 2012-01-28 DIAGNOSIS — E538 Deficiency of other specified B group vitamins: Secondary | ICD-10-CM

## 2012-01-28 DIAGNOSIS — R51 Headache: Secondary | ICD-10-CM

## 2012-01-28 DIAGNOSIS — R519 Headache, unspecified: Secondary | ICD-10-CM

## 2012-01-28 DIAGNOSIS — M1460 Charcot's joint, unspecified site: Secondary | ICD-10-CM

## 2012-01-28 DIAGNOSIS — M1990 Unspecified osteoarthritis, unspecified site: Secondary | ICD-10-CM

## 2012-01-28 DIAGNOSIS — E559 Vitamin D deficiency, unspecified: Secondary | ICD-10-CM

## 2012-01-28 DIAGNOSIS — A5211 Tabes dorsalis: Secondary | ICD-10-CM

## 2012-01-28 LAB — BASIC METABOLIC PANEL
BUN: 19 mg/dL (ref 6–23)
CO2: 26 mEq/L (ref 19–32)
Calcium: 9.2 mg/dL (ref 8.4–10.5)
Chloride: 105 mEq/L (ref 96–112)
Creatinine, Ser: 0.6 mg/dL (ref 0.4–1.2)
GFR: 115.46 mL/min (ref >60.00)
Glucose, Bld: 94 mg/dL (ref 70–99)
Potassium: 3.6 mEq/L (ref 3.5–5.1)
Sodium: 139 mEq/L (ref 135–145)

## 2012-01-28 LAB — SEDIMENTATION RATE: Sed Rate: 11 mm/hr (ref 0–22)

## 2012-01-28 MED ORDER — PREGABALIN 50 MG PO CAPS: 50.0000 mg | ORAL_CAPSULE | Freq: Two times a day (BID) | ORAL | Status: DC

## 2012-01-28 MED ORDER — CYANOCOBALAMIN 1000 MCG/ML IJ SOLN
1000.0000 ug | Freq: Once | INTRAMUSCULAR | Status: AC
  Administered 2012-01-28: 1000 ug via INTRAMUSCULAR

## 2012-01-28 MED ORDER — ERGOCALCIFEROL 1.25 MG (50000 UT) PO CAPS: 50000.0000 [IU] | ORAL_CAPSULE | Freq: Two times a day (BID) | ORAL | Status: AC

## 2012-01-28 MED ORDER — ATENOLOL 25 MG PO TABS: 25.0000 mg | ORAL_TABLET | Freq: Every day | ORAL | Status: DC

## 2012-01-28 NOTE — Patient Instructions (Signed)
**Note De-identified Kaelin Obfuscation** The patient is instructed to continue all medications as prescribed. Schedule followup with check out clerk upon leaving the clinic  

## 2012-01-28 NOTE — Progress Notes (Signed)
**Note De-identified Rainey Obfuscation**  **Note De-Identified Olmo Obfuscation** Subjective:    Patient ID: Felicia Schultz, female    DOB: Jan 09, 1951, 61 y.o.   MRN: [id number]  HPI Increased MS pain. Pain in proximal muscles without weakness. Increased generalized fatigue. Elevated blood pressure and head aches. Episodes of weakness with pain in legs Saw Dr Fabian Sharp Patient had screening for primary myositis, for polymyalgia rheumatica that were both negative.  Patient's personal history is significant for cervical arthritis multilevel disc and spurring.  Patient has a history of a B12 deficiency with low B12 patient has a history of vitamin D deficiency with low vitamin D.      Review of Systems  Constitutional: Negative for activity change, appetite change and fatigue.  HENT: Negative for ear pain, congestion, neck pain, postnasal drip and sinus pressure.   Eyes: Negative for redness and visual disturbance.  Respiratory: Negative for cough, shortness of breath and wheezing.   Gastrointestinal: Negative for abdominal pain and abdominal distention.  Genitourinary: Negative for dysuria, frequency and menstrual problem.  Musculoskeletal: Negative for myalgias, joint swelling and arthralgias.  Skin: Negative for rash and wound.  Neurological: Negative for dizziness, weakness and headaches.  Hematological: Negative for adenopathy. Does not bruise/bleed easily.  Psychiatric/Behavioral: Negative for disturbed wake/sleep cycle and decreased concentration.       Objective:   Physical Exam  Nursing note and vitals reviewed. Constitutional: She is oriented to person, place, and time. She appears well-developed and well-nourished. No distress.  HENT:  Head: Normocephalic and atraumatic.  Right Ear: External ear normal.  Left Ear: External ear normal.  Nose: Nose normal.  Mouth/Throat: Oropharynx is clear and moist.  Eyes: Conjunctivae and EOM are normal. Pupils are equal, round, and reactive to light.  Neck: Normal range of motion. Neck supple. No JVD present. No  tracheal deviation present. No thyromegaly present.  Cardiovascular: Normal rate, regular rhythm, normal heart sounds and intact distal pulses.   No murmur heard. Pulmonary/Chest: Effort normal and breath sounds normal. She has no wheezes. She exhibits no tenderness.  Abdominal: Soft. Bowel sounds are normal.  Musculoskeletal: Normal range of motion. She exhibits no edema and no tenderness.  Lymphadenopathy:    She has no cervical adenopathy.  Neurological: She is alert and oriented to person, place, and time. She has normal reflexes. No cranial nerve deficit.  Skin: Skin is warm and dry. She is not diaphoretic.  Psychiatric: She has a normal mood and affect. Her behavior is normal.          Assessment & Plan:  Evaluation of MSK pain Consider rule out parathyroid disease  Evaluation increased vitamin D supplementation B12 shot today and begin low dose liver, 50 mg by mouth twice a day  Increased head ache with elevated

## 2012-01-28 NOTE — Addendum Note (Signed)
**Note De-Identified Majerus Obfuscation** Addended by: Alfred Levins D on: 01/28/2012 11:44 AM   Modules accepted: Orders

## 2012-01-29 LAB — PTH, INTACT AND CALCIUM
Calcium, Total (PTH): 9.1 mg/dL (ref 8.4–10.5)
PTH: 55.6 pg/mL (ref 14.0–72.0)

## 2012-02-13 ENCOUNTER — Telehealth: Payer: Self-pay | Admitting: Family Medicine

## 2012-02-13 NOTE — Telephone Encounter (Signed)
**Note De-Identified Meyn Obfuscation** Sorry - films were ordered 01/28/12.

## 2012-02-13 NOTE — Telephone Encounter (Signed)
**Note De-Identified Flewellen Obfuscation** We received notification that this pt did not complete the spine films ordered 02/10/12. Thank you.

## 2012-03-03 ENCOUNTER — Ambulatory Visit (INDEPENDENT_AMBULATORY_CARE_PROVIDER_SITE_OTHER): Payer: BC Managed Care – PPO | Admitting: Internal Medicine

## 2012-03-03 DIAGNOSIS — Z23 Encounter for immunization: Secondary | ICD-10-CM

## 2012-03-03 DIAGNOSIS — D518 Other vitamin B12 deficiency anemias: Secondary | ICD-10-CM

## 2012-03-03 DIAGNOSIS — D519 Vitamin B12 deficiency anemia, unspecified: Secondary | ICD-10-CM

## 2012-03-03 MED ORDER — CYANOCOBALAMIN 1000 MCG/ML IJ SOLN
1000.0000 ug | INTRAMUSCULAR | Status: DC
  Administered 2012-03-03: 1000 ug via INTRAMUSCULAR

## 2012-03-26 ENCOUNTER — Ambulatory Visit (INDEPENDENT_AMBULATORY_CARE_PROVIDER_SITE_OTHER)
Admission: RE | Admit: 2012-03-26 | Discharge: 2012-03-26 | Disposition: A | Discharge status: Home / Self Care | Payer: BC Managed Care – PPO | Source: Ambulatory Visit | Attending: Internal Medicine | Admitting: Internal Medicine

## 2012-03-26 DIAGNOSIS — M1460 Charcot's joint, unspecified site: Secondary | ICD-10-CM

## 2012-03-26 DIAGNOSIS — A5211 Tabes dorsalis: Secondary | ICD-10-CM

## 2012-03-29 ENCOUNTER — Telehealth: Payer: Self-pay | Admitting: Internal Medicine

## 2012-03-29 DIAGNOSIS — M9979 Connective tissue and disc stenosis of intervertebral foramina of abdomen and other regions: Secondary | ICD-10-CM

## 2012-03-29 NOTE — Telephone Encounter (Signed)
**Note De-Identified Milian Obfuscation** Caller: Tran/Patient; Phone: [phone]; Reason for Call: Patient returning call to [facility] regarding blood test and x-ray on lower back.

## 2012-03-29 NOTE — Telephone Encounter (Signed)
**Note De-Identified Uhlir Obfuscation** Disc narrowing at l4-5= to dr Ethelene Hal- pt informed and referral sent

## 2012-03-29 NOTE — Telephone Encounter (Signed)
**Note De-Identified Hehl Obfuscation** Pt informed and per dr Lovell Sheehan- to dr Ethelene Hal

## 2012-04-14 ENCOUNTER — Ambulatory Visit: Payer: BC Managed Care – PPO | Admitting: Internal Medicine

## 2012-07-15 ENCOUNTER — Ambulatory Visit: Payer: BC Managed Care – PPO | Admitting: *Deleted

## 2012-07-16 ENCOUNTER — Ambulatory Visit (INDEPENDENT_AMBULATORY_CARE_PROVIDER_SITE_OTHER): Payer: BC Managed Care – PPO | Admitting: Internal Medicine

## 2012-07-16 DIAGNOSIS — E538 Deficiency of other specified B group vitamins: Secondary | ICD-10-CM

## 2012-07-16 MED ORDER — CYANOCOBALAMIN 1000 MCG/ML IJ SOLN
1000.0000 ug | Freq: Once | INTRAMUSCULAR | Status: AC
  Administered 2012-07-16: 1000 ug via INTRAMUSCULAR

## 2012-11-05 ENCOUNTER — Other Ambulatory Visit: Payer: Self-pay | Admitting: *Deleted

## 2012-11-05 MED ORDER — ESTROGENS CONJUGATED 1.25 MG PO TABS: 1.2500 mg | ORAL_TABLET | Freq: Every day | ORAL | Status: DC

## 2012-12-03 ENCOUNTER — Encounter: Payer: Self-pay | Admitting: Internal Medicine

## 2012-12-03 ENCOUNTER — Ambulatory Visit (INDEPENDENT_AMBULATORY_CARE_PROVIDER_SITE_OTHER): Payer: BC Managed Care – PPO | Admitting: Internal Medicine

## 2012-12-03 VITALS — BP 124/82 | HR 56 | Temp 98.1°F | Resp 16 | Ht 64.0 in | Wt 165.0 lb

## 2012-12-03 DIAGNOSIS — D518 Other vitamin B12 deficiency anemias: Secondary | ICD-10-CM

## 2012-12-03 DIAGNOSIS — I10 Essential (primary) hypertension: Secondary | ICD-10-CM

## 2012-12-03 DIAGNOSIS — M62838 Other muscle spasm: Secondary | ICD-10-CM

## 2012-12-03 DIAGNOSIS — E559 Vitamin D deficiency, unspecified: Secondary | ICD-10-CM

## 2012-12-03 DIAGNOSIS — R51 Headache: Secondary | ICD-10-CM

## 2012-12-03 DIAGNOSIS — D519 Vitamin B12 deficiency anemia, unspecified: Secondary | ICD-10-CM

## 2012-12-03 DIAGNOSIS — R519 Headache, unspecified: Secondary | ICD-10-CM

## 2012-12-03 DIAGNOSIS — M1460 Charcot's joint, unspecified site: Secondary | ICD-10-CM

## 2012-12-03 DIAGNOSIS — A5211 Tabes dorsalis: Secondary | ICD-10-CM

## 2012-12-03 LAB — CBC WITH DIFFERENTIAL/PLATELET
Basophils Absolute: 0 10*3/uL (ref 0.0–0.1)
Basophils Relative: 0.6 % (ref 0.0–3.0)
Eosinophils Absolute: 0 10*3/uL (ref 0.0–0.7)
Eosinophils Relative: 0.7 % (ref 0.0–5.0)
HCT: 36.9 % (ref 36.0–46.0)
Hemoglobin: 12.4 g/dL (ref 12.0–15.0)
Lymphocytes Relative: 28.6 % (ref 12.0–46.0)
Lymphs Abs: 1.5 10*3/uL (ref 0.7–4.0)
MCHC: 33.4 g/dL (ref 30.0–36.0)
MCV: 96.3 fl (ref 78.0–100.0)
Monocytes Absolute: 0.3 10*3/uL (ref 0.1–1.0)
Monocytes Relative: 5.2 % (ref 3.0–12.0)
Neutro Abs: 3.4 10*3/uL (ref 1.4–7.7)
Neutrophils Relative %: 64.9 % (ref 43.0–77.0)
Platelets: 190 10*3/uL (ref 150.0–400.0)
RBC: 3.84 Mil/uL — ABNORMAL LOW (ref 3.87–5.11)
RDW: 14.1 % (ref 11.5–14.6)
WBC: 5.2 10*3/uL (ref 4.5–10.5)

## 2012-12-03 LAB — BASIC METABOLIC PANEL
BUN: 15 mg/dL (ref 6–23)
CO2: 27 mEq/L (ref 19–32)
Calcium: 9.4 mg/dL (ref 8.4–10.5)
Chloride: 107 mEq/L (ref 96–112)
Creatinine, Ser: 0.7 mg/dL (ref 0.4–1.2)
GFR: 93.91 mL/min (ref >60.00)
Glucose, Bld: 98 mg/dL (ref 70–99)
Potassium: 4.4 mEq/L (ref 3.5–5.1)
Sodium: 141 mEq/L (ref 135–145)

## 2012-12-03 LAB — CALCIUM: Calcium: 9.4 mg/dL (ref 8.4–10.5)

## 2012-12-03 LAB — VITAMIN B12: Vitamin B-12: 567 pg/mL (ref 211–911)

## 2012-12-03 MED ORDER — SERTRALINE HCL 100 MG PO TABS: 100.0000 mg | ORAL_TABLET | Freq: Every day | ORAL | Status: DC | PRN

## 2012-12-03 MED ORDER — ATENOLOL 25 MG PO TABS: 25.0000 mg | ORAL_TABLET | Freq: Every day | ORAL | Status: DC

## 2012-12-03 MED ORDER — LEVOCETIRIZINE DIHYDROCHLORIDE 5 MG PO TABS: 5.0000 mg | ORAL_TABLET | Freq: Every evening | ORAL | Status: DC

## 2012-12-03 MED ORDER — CYCLOBENZAPRINE HCL 5 MG PO TABS: 5.0000 mg | ORAL_TABLET | Freq: Three times a day (TID) | ORAL | Status: DC | PRN

## 2012-12-03 MED ORDER — ESTROGENS CONJUGATED 1.25 MG PO TABS: 1.2500 mg | ORAL_TABLET | Freq: Every day | ORAL | Status: DC

## 2012-12-03 MED ORDER — HYDROCODONE-ACETAMINOPHEN 10-325 MG PO TABS: 1.0000 | ORAL_TABLET | Freq: Three times a day (TID) | ORAL | Status: DC | PRN

## 2012-12-03 MED ORDER — NABUMETONE 500 MG PO TABS: 500.0000 mg | ORAL_TABLET | Freq: Two times a day (BID) | ORAL | Status: DC

## 2012-12-03 MED ORDER — FLUTICASONE PROPIONATE 50 MCG/ACT NA SUSP: 2.0000 | Freq: Every day | NASAL | Status: DC

## 2012-12-03 NOTE — Progress Notes (Signed)
**Note De-identified Bonawitz Obfuscation**  **Note De-Identified Bergthold Obfuscation** Subjective:    Patient ID: Felicia Schultz, female    DOB: May 01, 1951, 62 y.o.   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  HENT: Negative for ear pain, congestion, neck pain, postnasal drip and sinus pressure.   Eyes: Negative for redness and visual disturbance.  Respiratory: Negative for cough, shortness of breath and wheezing.   Gastrointestinal: Negative for abdominal pain and abdominal distention.  Genitourinary: Negative for dysuria, frequency and menstrual problem.  Musculoskeletal: Negative for myalgias, joint swelling and arthralgias.  Skin: Negative for rash and wound.  Neurological: Negative for dizziness, weakness and headaches.  Hematological: Negative for adenopathy. Does not bruise/bleed easily.  Psychiatric/Behavioral: Negative for sleep disturbance and decreased concentration.       Objective:   Physical Exam  Constitutional: She is oriented to person, place, and time. She appears well-developed and well-nourished. No distress.  HENT:  Head: Normocephalic and atraumatic.  Right Ear: External ear normal.  Left Ear: External ear normal.  Nose: Nose normal.  Mouth/Throat: Oropharynx is clear and moist.  Eyes: Conjunctivae and EOM are normal. Pupils are equal, round, and reactive to light.  Neck: Normal range of motion. Neck supple. No JVD present. No tracheal deviation present. No thyromegaly present.  Cardiovascular: Normal rate, regular rhythm, normal heart sounds and intact distal pulses.   No murmur heard. Pulmonary/Chest: Effort normal and breath sounds normal. She has no wheezes. She exhibits no tenderness.  Abdominal: Soft. Bowel sounds are normal.  Musculoskeletal: Normal range of motion. She exhibits no edema and  no tenderness.  Lymphadenopathy:    She has no cervical adenopathy.  Neurological: She is alert and oriented to person, place, and time. She has normal reflexes. No cranial nerve deficit.  Skin: Skin is warm and dry. She is not diaphoretic.  Psychiatric: She has a normal mood and affect. Her behavior is normal.          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  Monitoring for B12 deficiency monitoring for vitamin D deficiency monitoring for hypertension and a basic metabolic panel calcium B12 and vitamin D level will be monitored today and reported back to the patient.

## 2012-12-03 NOTE — Patient Instructions (Signed)
**Note De-identified Giacobbe Obfuscation** The patient is instructed to continue all medications as prescribed. Schedule followup with check out clerk upon leaving the clinic  

## 2012-12-04 LAB — VITAMIN D 25 HYDROXY (VIT D DEFICIENCY, FRACTURES): Vit D, 25-Hydroxy: 36 ng/mL (ref 30–89)

## 2013-03-08 ENCOUNTER — Other Ambulatory Visit: Payer: Self-pay | Admitting: Internal Medicine

## 2013-03-08 DIAGNOSIS — M1460 Charcot's joint, unspecified site: Secondary | ICD-10-CM

## 2013-03-08 DIAGNOSIS — M62838 Other muscle spasm: Secondary | ICD-10-CM

## 2013-03-08 DIAGNOSIS — R519 Headache, unspecified: Secondary | ICD-10-CM

## 2013-03-08 MED ORDER — HYDROCODONE-ACETAMINOPHEN 10-325 MG PO TABS: 1.0000 | ORAL_TABLET | Freq: Three times a day (TID) | ORAL | Status: DC | PRN

## 2013-03-09 ENCOUNTER — Other Ambulatory Visit: Payer: Self-pay | Admitting: *Deleted

## 2013-03-09 DIAGNOSIS — M62838 Other muscle spasm: Secondary | ICD-10-CM

## 2013-03-09 MED ORDER — CYCLOBENZAPRINE HCL 5 MG PO TABS: 5.0000 mg | ORAL_TABLET | Freq: Three times a day (TID) | ORAL | Status: DC | PRN

## 2013-04-04 ENCOUNTER — Encounter: Payer: Self-pay | Admitting: Internal Medicine

## 2013-04-04 ENCOUNTER — Ambulatory Visit (INDEPENDENT_AMBULATORY_CARE_PROVIDER_SITE_OTHER): Payer: BC Managed Care – PPO | Admitting: Internal Medicine

## 2013-04-04 VITALS — BP 140/80 | HR 72 | Temp 98.0°F | Resp 16 | Ht 64.0 in | Wt 168.0 lb

## 2013-04-04 DIAGNOSIS — Z0000 Encounter for general adult medical examination without abnormal findings: Secondary | ICD-10-CM

## 2013-04-04 DIAGNOSIS — E569 Vitamin deficiency, unspecified: Secondary | ICD-10-CM

## 2013-04-04 LAB — HEPATIC FUNCTION PANEL
ALT: 15 U/L (ref 0–35)
AST: 17 U/L (ref 0–37)
Albumin: 3.9 g/dL (ref 3.5–5.2)
Alkaline Phosphatase: 66 U/L (ref 39–117)
Bilirubin, Direct: 0 mg/dL (ref 0.0–0.3)
Total Bilirubin: 0.7 mg/dL (ref 0.3–1.2)
Total Protein: 6.7 g/dL (ref 6.0–8.3)

## 2013-04-04 LAB — POCT URINALYSIS DIPSTICK
Bilirubin, UA: NEGATIVE
Glucose, UA: NEGATIVE
Ketones, UA: NEGATIVE
Leukocytes, UA: NEGATIVE
Nitrite, UA: NEGATIVE
Protein, UA: NEGATIVE
Spec Grav, UA: 1.015
Urobilinogen, UA: 0.2
pH, UA: 6.5

## 2013-04-04 LAB — CBC WITH DIFFERENTIAL/PLATELET
Basophils Absolute: 0 10*3/uL (ref 0.0–0.1)
Basophils Relative: 0.4 % (ref 0.0–3.0)
Eosinophils Absolute: 0 10*3/uL (ref 0.0–0.7)
Eosinophils Relative: 0.8 % (ref 0.0–5.0)
HCT: 36.8 % (ref 36.0–46.0)
Hemoglobin: 12.4 g/dL (ref 12.0–15.0)
Lymphocytes Relative: 30.6 % (ref 12.0–46.0)
Lymphs Abs: 1.4 10*3/uL (ref 0.7–4.0)
MCHC: 33.6 g/dL (ref 30.0–36.0)
MCV: 92.6 fl (ref 78.0–100.0)
Monocytes Absolute: 0.3 10*3/uL (ref 0.1–1.0)
Monocytes Relative: 7.3 % (ref 3.0–12.0)
Neutro Abs: 2.8 10*3/uL (ref 1.4–7.7)
Neutrophils Relative %: 60.9 % (ref 43.0–77.0)
Platelets: 170 10*3/uL (ref 150.0–400.0)
RBC: 3.97 Mil/uL (ref 3.87–5.11)
RDW: 13.6 % (ref 11.5–14.6)
WBC: 4.6 10*3/uL (ref 4.5–10.5)

## 2013-04-04 LAB — BASIC METABOLIC PANEL
BUN: 17 mg/dL (ref 6–23)
CO2: 28 mEq/L (ref 19–32)
Calcium: 9 mg/dL (ref 8.4–10.5)
Chloride: 108 mEq/L (ref 96–112)
Creatinine, Ser: 0.5 mg/dL (ref 0.4–1.2)
GFR: 122.39 mL/min (ref >60.00)
Glucose, Bld: 87 mg/dL (ref 70–99)
Potassium: 3.5 mEq/L (ref 3.5–5.1)
Sodium: 142 mEq/L (ref 135–145)

## 2013-04-04 LAB — LIPID PANEL
Cholesterol: 153 mg/dL (ref 0–200)
HDL: 79.7 mg/dL (ref >39.00)
LDL Cholesterol: 63 mg/dL (ref 0–99)
Total CHOL/HDL Ratio: 2
Triglycerides: 52 mg/dL (ref 0.0–149.0)
VLDL: 10.4 mg/dL (ref 0.0–40.0)

## 2013-04-04 LAB — TSH: TSH: 0.87 u[IU]/mL (ref 0.35–5.50)

## 2013-04-04 NOTE — Patient Instructions (Signed)
**Note De-identified Schuenemann Obfuscation** The patient is instructed to continue all medications as prescribed. Schedule followup with check out clerk upon leaving the clinic  

## 2013-04-04 NOTE — Progress Notes (Signed)
**Note De-identified Westergard Obfuscation**  **Note De-Identified Majette Obfuscation** Subjective:    Patient ID: Felicia Schultz, female    DOB: 1951-05-31, 62 y.o.   MRN: [id number]  HPI CPX  Diet and weight loss discussion Exercise    Review of Systems  Constitutional: Negative for activity change, appetite change and fatigue.  HENT: Negative for congestion, ear pain, postnasal drip and sinus pressure.   Eyes: Negative for redness and visual disturbance.  Respiratory: Negative for cough, shortness of breath and wheezing.   Gastrointestinal: Negative for abdominal pain and abdominal distention.  Genitourinary: Negative for dysuria, frequency and menstrual problem.  Musculoskeletal: Negative for arthralgias, joint swelling, myalgias and neck pain.  Skin: Negative for rash and wound.  Neurological: Negative for dizziness, weakness and headaches.  Hematological: Negative for adenopathy. Does not bruise/bleed easily.  Psychiatric/Behavioral: Negative for sleep disturbance and decreased concentration.       Objective:   Physical Exam  Nursing note and vitals reviewed. Constitutional: She is oriented to person, place, and time. She appears well-developed and well-nourished. No distress.  HENT:  Head: Normocephalic and atraumatic.  Eyes: Conjunctivae and EOM are normal. Pupils are equal, round, and reactive to light.  Neck: Normal range of motion. Neck supple. No JVD present. No tracheal deviation present. No thyromegaly present.  Cardiovascular: Normal rate, regular rhythm and intact distal pulses.   Murmur heard. Pulmonary/Chest: Effort normal and breath sounds normal. She has no wheezes. She exhibits no tenderness.  Abdominal: Soft. Bowel sounds are normal.  Musculoskeletal: Normal range of motion. She exhibits no edema and no tenderness.  Surgical changes to feet  Lymphadenopathy:    She has no cervical adenopathy.  Neurological: She is alert and oriented to person, place, and time. She has normal reflexes. No cranial nerve deficit.  Skin: Skin is warm and dry.  She is not diaphoretic.  Psychiatric: She has a normal mood and affect. Her behavior is normal.          Assessment & Plan:   This is a routine physical examination for this healthy  Female. Reviewed all health maintenance protocols including mammography colonoscopy bone density and reviewed appropriate screening labs. Her immunization history was reviewed as well as her current medications and allergies refills of her chronic medications were given and the plan for yearly health maintenance was discussed all orders and referrals were made as appropriate.  Flu shot at work Needs to have mammogram and pap Pt agrees to schedule

## 2013-04-05 LAB — VITAMIN D 25 HYDROXY (VIT D DEFICIENCY, FRACTURES): Vit D, 25-Hydroxy: 28 ng/mL — ABNORMAL LOW (ref 30–89)

## 2013-06-16 DIAGNOSIS — Z9889 Other specified postprocedural states: Secondary | ICD-10-CM

## 2013-06-16 HISTORY — DX: Other specified postprocedural states: Z98.890

## 2013-07-10 ENCOUNTER — Telehealth: Payer: Self-pay | Admitting: Internal Medicine

## 2013-07-10 NOTE — Telephone Encounter (Signed)
**Note De-Identified Deamer Obfuscation** [place] [id number] requesting refill of omeprazole (PRILOSEC) 20 MG capsule #30

## 2013-07-11 ENCOUNTER — Other Ambulatory Visit: Payer: Self-pay | Admitting: *Deleted

## 2013-07-11 MED ORDER — OMEPRAZOLE 20 MG PO CPDR: 20.0000 mg | DELAYED_RELEASE_CAPSULE | Freq: Every day | ORAL | Status: DC

## 2013-08-10 ENCOUNTER — Ambulatory Visit (INDEPENDENT_AMBULATORY_CARE_PROVIDER_SITE_OTHER): Payer: BC Managed Care – PPO | Admitting: Internal Medicine

## 2013-08-10 ENCOUNTER — Encounter: Payer: Self-pay | Admitting: Internal Medicine

## 2013-08-10 VITALS — BP 144/88 | HR 92 | Temp 98.7°F | Resp 20 | Ht 64.0 in | Wt 182.0 lb

## 2013-08-10 DIAGNOSIS — B9789 Other viral agents as the cause of diseases classified elsewhere: Principal | ICD-10-CM

## 2013-08-10 DIAGNOSIS — I10 Essential (primary) hypertension: Secondary | ICD-10-CM

## 2013-08-10 DIAGNOSIS — J069 Acute upper respiratory infection, unspecified: Secondary | ICD-10-CM

## 2013-08-10 DIAGNOSIS — R51 Headache: Secondary | ICD-10-CM

## 2013-08-10 DIAGNOSIS — R519 Headache, unspecified: Secondary | ICD-10-CM

## 2013-08-10 DIAGNOSIS — M62838 Other muscle spasm: Secondary | ICD-10-CM

## 2013-08-10 DIAGNOSIS — M1460 Charcot's joint, unspecified site: Secondary | ICD-10-CM

## 2013-08-10 DIAGNOSIS — A5211 Tabes dorsalis: Secondary | ICD-10-CM

## 2013-08-10 MED ORDER — HYDROCODONE-ACETAMINOPHEN 10-325 MG PO TABS: 1.0000 | ORAL_TABLET | Freq: Three times a day (TID) | ORAL | Status: DC | PRN

## 2013-08-10 NOTE — Progress Notes (Signed)
**Note De-identified Dyment Obfuscation** Pre-visit discussion using our clinic review tool. No additional management support is needed unless otherwise documented below in the visit note.  

## 2013-08-10 NOTE — Patient Instructions (Signed)
**Note De-identified Resor Obfuscation** Acute bronchitis symptoms for less than 10 days are generally not helped by antibiotics.  Take over-the-counter expectorants and cough medications such as  Mucinex DM.  Call if there is no improvement in 5 to 7 days or if he developed worsening cough, fever, or new symptoms, such as shortness of breath or chest pain.    

## 2013-08-10 NOTE — Progress Notes (Signed)
**Note De-Identified Willaims Obfuscation** Subjective:    Patient ID: Felicia Schultz, female    DOB: Apr 02, 1951, 63 y.o.   MRN: [id number]  HPI  63 year old patient, who presents with a four-day history of cough, congestion, headache, sore throat, and low-grade fever.  Associated symptoms have included hoarseness.  She has treated hypertension.  Other medical problems include a history of allergic rhinitis and prior sinusitis.  Past Medical History  Diagnosis Date  . Diabetes mellitus     type 2  . Depression   . Hypertension   . Hypokalemia     History   Social History  . Marital Status: Married    Spouse Name: N/A    Number of Children: N/A  . Years of Education: N/A   Occupational History  . Not on file.   Social History Main Topics  . Smoking status: Never Smoker   . Smokeless tobacco: Not on file  . Alcohol Use: No  . Drug Use: No  . Sexual Activity: Yes   Other Topics Concern  . Not on file   Social History Narrative  . No narrative on file    Past Surgical History  Procedure Laterality Date  . Total abdominal hysterectomy    . Gastric bypass      Family History  Problem Relation Age of Onset  . Diabetes Mother   . Heart disease Mother   . Heart disease Father   . Hypertension Father     Allergies  Allergen Reactions  . Clarithromycin     REACTION: hives  . Nitrofurantoin   . Sulfonamide Derivatives     Current Outpatient Prescriptions on File Prior to Visit  Medication Sig Dispense Refill  . atenolol (TENORMIN) 25 MG tablet Take 1 tablet (25 mg total) by mouth daily.  90 tablet  3  . cyclobenzaprine (FLEXERIL) 5 MG tablet Take 1 tablet (5 mg total) by mouth 3 (three) times daily as needed for muscle spasms.  30 tablet  2  . estrogens, conjugated, (PREMARIN) 1.25 MG tablet Take 1 tablet (1.25 mg total) by mouth daily.  90 tablet  3  . Multiple Vitamin (MULTIVITAMIN) tablet Take 1 tablet by mouth daily.        . nabumetone (RELAFEN) 500 MG tablet Take 1 tablet (500 mg total) by mouth 2  (two) times daily.  180 tablet  3  . omeprazole (PRILOSEC) 20 MG capsule Take 1 capsule (20 mg total) by mouth daily.  30 capsule  11  . sertraline (ZOLOFT) 100 MG tablet Take 1 tablet (100 mg total) by mouth daily as needed.  90 tablet  3  . fluticasone (FLONASE) 50 MCG/ACT nasal spray Place 2 sprays into the nose daily.  48 g  3  . levocetirizine (XYZAL) 5 MG tablet Take 1 tablet (5 mg total) by mouth every evening.  90 tablet  3  . mometasone (NASONEX) 50 MCG/ACT nasal spray Place 2 sprays into the nose daily.  17 g  6   No current facility-administered medications on file prior to visit.    BP 144/88  Pulse 92  Temp(Src) 98.7 F (37.1 C) (Oral)  Resp 20  Ht 5\' 4"  (1.626 m)  Wt 182 lb (82.555 kg)  BMI 31.22 kg/m2  SpO2 96%       Review of Systems  Constitutional: Positive for activity change, appetite change and fatigue.  HENT: Positive for congestion, postnasal drip, rhinorrhea, sore throat and voice change. Negative for dental problem, hearing loss, sinus pressure and tinnitus. **Note De-Identified Siddiqi Obfuscation** Eyes: Negative for pain, discharge and visual disturbance.  Respiratory: Positive for cough. Negative for shortness of breath.   Cardiovascular: Negative for chest pain, palpitations and leg swelling.  Gastrointestinal: Negative for nausea, vomiting, abdominal pain, diarrhea, constipation, blood in stool and abdominal distention.  Genitourinary: Negative for dysuria, urgency, frequency, hematuria, flank pain, vaginal bleeding, vaginal discharge, difficulty urinating, vaginal pain and pelvic pain.  Musculoskeletal: Negative for arthralgias, gait problem and joint swelling.  Skin: Negative for rash.  Neurological: Negative for dizziness, syncope, speech difficulty, weakness, numbness and headaches.  Hematological: Negative for adenopathy.  Psychiatric/Behavioral: Negative for behavioral problems, dysphoric mood and agitation. The patient is not nervous/anxious.        Objective:   Physical Exam    Constitutional: She is oriented to person, place, and time. She appears well-developed and well-nourished.  HENT:  Head: Normocephalic.  Right Ear: External ear normal.  Left Ear: External ear normal.  Oral pharynx slightly erythematous  Eyes: Conjunctivae and EOM are normal. Pupils are equal, round, and reactive to light.  Neck: Normal range of motion. Neck supple. No thyromegaly present.  Cardiovascular: Normal rate, regular rhythm, normal heart sounds and intact distal pulses.   Pulmonary/Chest: Effort normal and breath sounds normal.  Abdominal: Soft. Bowel sounds are normal. She exhibits no mass. There is no tenderness.  Musculoskeletal: Normal range of motion.  Lymphadenopathy:    She has no cervical adenopathy.  Neurological: She is alert and oriented to person, place, and time.  Skin: Skin is warm and dry. No rash noted.  Psychiatric: She has a normal mood and affect. Her behavior is normal.          Assessment & Plan:   Viral URI with cough.  Will treat symptomatically.  Refilled the hydrocodone, which he may take for cough, pain or fever.  She also takes Relafen when necessary, as well as Mucinex DM Hypertension stable

## 2013-08-12 ENCOUNTER — Telehealth: Payer: Self-pay | Admitting: Internal Medicine

## 2013-08-12 NOTE — Telephone Encounter (Signed)
**Note De-identified Brannigan Obfuscation** Relevant patient education assigned to patient using Emmi. ° °

## 2013-08-13 ENCOUNTER — Ambulatory Visit (INDEPENDENT_AMBULATORY_CARE_PROVIDER_SITE_OTHER): Payer: BC Managed Care – PPO | Admitting: Family Medicine

## 2013-08-13 ENCOUNTER — Encounter: Payer: Self-pay | Admitting: Family Medicine

## 2013-08-13 VITALS — BP 128/80 | HR 71 | Temp 99.3°F | Ht 64.0 in | Wt 180.0 lb

## 2013-08-13 DIAGNOSIS — A499 Bacterial infection, unspecified: Secondary | ICD-10-CM

## 2013-08-13 DIAGNOSIS — B9689 Other specified bacterial agents as the cause of diseases classified elsewhere: Secondary | ICD-10-CM

## 2013-08-13 DIAGNOSIS — J208 Acute bronchitis due to other specified organisms: Secondary | ICD-10-CM

## 2013-08-13 DIAGNOSIS — J209 Acute bronchitis, unspecified: Secondary | ICD-10-CM

## 2013-08-13 MED ORDER — HYDROCOD POLST-CHLORPHEN POLST 10-8 MG/5ML PO LQCR: 5.0000 mL | Freq: Two times a day (BID) | ORAL | Status: DC | PRN

## 2013-08-13 MED ORDER — LEVOFLOXACIN 500 MG PO TABS: 500.0000 mg | ORAL_TABLET | Freq: Every day | ORAL | Status: DC

## 2013-08-13 NOTE — Progress Notes (Signed)
**Note De-Identified Mcclish Obfuscation** Subjective:    Patient ID: Felicia Schultz, female    DOB: 13-Mar-1951, 63 y.o.   MRN: [id number]  HPI Here with uri symptoms  Started last Sunday - headache and fatigue Monday- lost voice  Saw Dr Raliegh Ip on wed- dx with bronchitis and told to take a pain reliever for ST   Symptoms continue to worsen Now her phlegm is darker and sore throat is worse and voice comes and goes  Bad cough - taking mucinex bid to loosen mucous Chest is sore from coughing   Temp is low grade 99.3   No wheezing or sob Was briefly nauseated-no vomiting   Patient Active Problem List   Diagnosis Date Noted  . Myalgia 01/09/2012  . Fatigue 01/09/2012  . Sinusitis acute 04/21/2011  . Cyst, breast, sebaceous 08/28/2010  . UNSPECIFIED HYPERTROPHIC&ATROPHIC CONDITION SKIN 06/04/2010  . OSTEOPENIA 06/04/2010  . NECK PAIN 03/18/2010  . ALLERGIC URTICARIA 03/27/2009  . ACUTE MAXILLARY SINUSITIS 03/22/2008  . GANGLION CYST, WRIST, RIGHT 03/22/2008  . UNSPECIFIED VITAMIN DEFICIENCY 10/01/2007  . B12 DEFICIENCY 05/28/2007  . DEPRESSION 03/08/2007  . HYPERTENSION 03/08/2007  . HEADACHE 03/08/2007   Past Medical History  Diagnosis Date  . Diabetes mellitus     type 2  . Depression   . Hypertension   . Hypokalemia    Past Surgical History  Procedure Laterality Date  . Total abdominal hysterectomy    . Gastric bypass     History  Substance Use Topics  . Smoking status: Never Smoker   . Smokeless tobacco: Not on file  . Alcohol Use: No   Family History  Problem Relation Age of Onset  . Diabetes Mother   . Heart disease Mother   . Heart disease Father   . Hypertension Father    Allergies  Allergen Reactions  . Clarithromycin     REACTION: hives  . Nitrofurantoin   . Sulfonamide Derivatives    Current Outpatient Prescriptions on File Prior to Visit  Medication Sig Dispense Refill  . atenolol (TENORMIN) 25 MG tablet Take 1 tablet (25 mg total) by mouth daily.  90 tablet  3  . cyclobenzaprine  (FLEXERIL) 5 MG tablet Take 1 tablet (5 mg total) by mouth 3 (three) times daily as needed for muscle spasms.  30 tablet  2  . estrogens, conjugated, (PREMARIN) 1.25 MG tablet Take 1 tablet (1.25 mg total) by mouth daily.  90 tablet  3  . fluticasone (FLONASE) 50 MCG/ACT nasal spray Place 2 sprays into the nose daily.  48 g  3  . HYDROcodone-acetaminophen (NORCO) 10-325 MG per tablet Take 1 tablet by mouth every 8 (eight) hours as needed.  30 tablet  0  . levocetirizine (XYZAL) 5 MG tablet Take 1 tablet (5 mg total) by mouth every evening.  90 tablet  3  . loteprednol (ALREX) 0.2 % SUSP Place 1 drop into both eyes 2 (two) times daily.      . Multiple Vitamin (MULTIVITAMIN) tablet Take 1 tablet by mouth daily.        . nabumetone (RELAFEN) 500 MG tablet Take 1 tablet (500 mg total) by mouth 2 (two) times daily.  180 tablet  3  . omeprazole (PRILOSEC) 20 MG capsule Take 1 capsule (20 mg total) by mouth daily.  30 capsule  11  . sertraline (ZOLOFT) 100 MG tablet Take 1 tablet (100 mg total) by mouth daily as needed.  90 tablet  3  . Vitamin D, Ergocalciferol, (DRISDOL) 50000 UNITS **Note De-Identified Heyden Obfuscation** CAPS capsule Take 50,000 Units by mouth 2 (two) times a week.       . mometasone (NASONEX) 50 MCG/ACT nasal spray Place 2 sprays into the nose daily.  17 g  6   No current facility-administered medications on file prior to visit.     Review of Systems Review of Systems  Constitutional: Negative for  appetite change,  and unexpected weight change.  ENT pos for congestion/ st/ neg for sinus pain  Eyes: Negative for pain and visual disturbance.  Respiratory: Negative for shortness of breath.  pos for cough that is productive  Cardiovascular: Negative for cp or palpitations    Gastrointestinal: Negative for nausea, diarrhea and constipation.  Genitourinary: Negative for urgency and frequency.  Skin: Negative for pallor or rash   Neurological: Negative for weakness, light-headedness, numbness and headaches.    Hematological: Negative for adenopathy. Does not bruise/bleed easily.  Psychiatric/Behavioral: Negative for dysphoric mood. The patient is not nervous/anxious.         Objective:   Physical Exam  Constitutional: She appears well-developed and well-nourished. No distress.  HENT:  Head: Normocephalic and atraumatic.  Right Ear: External ear normal.  Left Ear: External ear normal.  Mouth/Throat: Oropharynx is clear and moist. No oropharyngeal exudate.  Nares are injected and congested  No sinus tenderness Clear post nasal drip  Eyes: Conjunctivae and EOM are normal. Pupils are equal, round, and reactive to light. Right eye exhibits no discharge. Left eye exhibits no discharge.  Neck: Normal range of motion. Neck supple.  Cardiovascular: Normal rate and regular rhythm.   Pulmonary/Chest: Effort normal and breath sounds normal. No respiratory distress. She has no wheezes. She has no rales.  Diffuse rhonchi and upper airway sounds No wheeze even on forced exp Wet sounding cough  Lymphadenopathy:    She has no cervical adenopathy.  Neurological: She is alert.  Skin: Skin is warm and dry. No rash noted.  Psychiatric: She has a normal mood and affect.          Assessment & Plan:

## 2013-08-13 NOTE — Patient Instructions (Addendum)
**Note De-Identified Chilton Obfuscation** Take levaquin as directed for bronchitis Drink lots of water and rest  tussionex for cough - use caution because it is very sedating  Update if not starting to improve in a week or if worsening   Also rest your voice the best you can  Stay out of work until 08/17/13

## 2013-08-13 NOTE — Assessment & Plan Note (Signed)
**Note De-Identified Hendon Obfuscation** Given length of illness and worsening of symptoms -tx with levaquin (pt all to many other abx) tussionex with caution for cough - warned of sedation Fluids/ rest Disc symptomatic care - see instructions on AVS  Update if not starting to improve in a week or if worsening  -esp if sob or wheeze

## 2013-10-03 ENCOUNTER — Ambulatory Visit (INDEPENDENT_AMBULATORY_CARE_PROVIDER_SITE_OTHER): Payer: BC Managed Care – PPO | Admitting: Internal Medicine

## 2013-10-03 ENCOUNTER — Encounter: Payer: Self-pay | Admitting: Internal Medicine

## 2013-10-03 VITALS — BP 120/70 | HR 70 | Temp 98.3°F | Ht 64.0 in | Wt 177.0 lb

## 2013-10-03 DIAGNOSIS — R5381 Other malaise: Secondary | ICD-10-CM

## 2013-10-03 DIAGNOSIS — R5383 Other fatigue: Secondary | ICD-10-CM

## 2013-10-03 DIAGNOSIS — J309 Allergic rhinitis, unspecified: Secondary | ICD-10-CM

## 2013-10-03 DIAGNOSIS — M5010 Cervical disc disorder with radiculopathy, unspecified cervical region: Secondary | ICD-10-CM

## 2013-10-03 DIAGNOSIS — M5412 Radiculopathy, cervical region: Secondary | ICD-10-CM

## 2013-10-03 DIAGNOSIS — H1010 Acute atopic conjunctivitis, unspecified eye: Secondary | ICD-10-CM

## 2013-10-03 DIAGNOSIS — R197 Diarrhea, unspecified: Secondary | ICD-10-CM

## 2013-10-03 LAB — CBC WITH DIFFERENTIAL/PLATELET
Basophils Absolute: 0 10*3/uL (ref 0.0–0.1)
Basophils Relative: 0.6 % (ref 0.0–3.0)
Eosinophils Absolute: 0 10*3/uL (ref 0.0–0.7)
Eosinophils Relative: 0.7 % (ref 0.0–5.0)
HCT: 40.5 % (ref 36.0–46.0)
Hemoglobin: 13.3 g/dL (ref 12.0–15.0)
Lymphocytes Relative: 30.1 % (ref 12.0–46.0)
Lymphs Abs: 2 10*3/uL (ref 0.7–4.0)
MCHC: 32.9 g/dL (ref 30.0–36.0)
MCV: 93.3 fl (ref 78.0–100.0)
Monocytes Absolute: 0.4 10*3/uL (ref 0.1–1.0)
Monocytes Relative: 6.5 % (ref 3.0–12.0)
Neutro Abs: 4.1 10*3/uL (ref 1.4–7.7)
Neutrophils Relative %: 62.1 % (ref 43.0–77.0)
Platelets: 242 10*3/uL (ref 150.0–400.0)
RBC: 4.34 Mil/uL (ref 3.87–5.11)
RDW: 13.8 % (ref 11.5–14.6)
WBC: 6.7 10*3/uL (ref 4.5–10.5)

## 2013-10-03 LAB — COMPREHENSIVE METABOLIC PANEL
ALT: 18 U/L (ref 0–35)
AST: 20 U/L (ref 0–37)
Albumin: 4 g/dL (ref 3.5–5.2)
Alkaline Phosphatase: 79 U/L (ref 39–117)
BUN: 16 mg/dL (ref 6–23)
CO2: 25 mEq/L (ref 19–32)
Calcium: 9.3 mg/dL (ref 8.4–10.5)
Chloride: 105 mEq/L (ref 96–112)
Creatinine, Ser: 0.6 mg/dL (ref 0.4–1.2)
GFR: 100.43 mL/min (ref >60.00)
Glucose, Bld: 76 mg/dL (ref 70–99)
Potassium: 3.4 mEq/L — ABNORMAL LOW (ref 3.5–5.1)
Sodium: 142 mEq/L (ref 135–145)
Total Bilirubin: 0.7 mg/dL (ref 0.3–1.2)
Total Protein: 7.2 g/dL (ref 6.0–8.3)

## 2013-10-03 LAB — TSH: TSH: 2.13 u[IU]/mL (ref 0.35–5.50)

## 2013-10-03 LAB — T4, FREE: Free T4: 0.82 ng/dL (ref 0.60–1.60)

## 2013-10-03 NOTE — Progress Notes (Signed)
**Note De-Identified [place] Obfuscation** Subjective:    Patient ID: Felicia Schultz, female    DOB: [birth year], 63 y.o.   MRN: [id number]  HPI Diarrhea episodes with weakness and prior diagnosis for noravirus recurent Had blood work with low potassium and low WBC Has not used blood pressure medications   Review of Systems  Constitutional: Positive for fatigue. Negative for activity change and appetite change.  HENT: Negative for congestion, ear pain, postnasal drip and sinus pressure.   Eyes: Negative for redness and visual disturbance.  Respiratory: Negative for cough, shortness of breath and wheezing.   Gastrointestinal: Positive for diarrhea. Negative for abdominal pain and abdominal distention.  Genitourinary: Negative for dysuria, frequency and menstrual problem.  Musculoskeletal: Negative for arthralgias, joint swelling, myalgias and neck pain.  Skin: Negative for rash and wound.  Neurological: Negative for dizziness, weakness and headaches.  Hematological: Negative for adenopathy. Does not bruise/bleed easily.  Psychiatric/Behavioral: Negative for sleep disturbance and decreased concentration.       Past Medical History  Diagnosis Date  . Diabetes mellitus     type 2  . Depression   . Hypertension   . Hypokalemia     History   Social History  . Marital Status: Married    Spouse Name: N/A    Number of Children: N/A  . Years of Education: N/A   Occupational History  . Not on file.   Social History Main Topics  . Smoking status: Never Smoker   . Smokeless tobacco: Never Used  . Alcohol Use: No  . Drug Use: No  . Sexual Activity: Yes   Other Topics Concern  . Not on file   Social History Narrative  . No narrative on file    Past Surgical History  Procedure Laterality Date  . Total abdominal hysterectomy    . Gastric bypass      Family History  Problem Relation Age of Onset  . Diabetes Mother   . Heart disease Mother   . Heart disease Father   . Hypertension Father     Allergies    Allergen Reactions  . Augmentin [Amoxicillin-Pot Clavulanate]     nausea  . Clarithromycin     REACTION: hives  . Nitrofurantoin   . Sulfonamide Derivatives     Current Outpatient Prescriptions on File Prior to Visit  Medication Sig Dispense Refill  . atenolol (TENORMIN) 25 MG tablet Take 1 tablet (25 mg total) by mouth daily.  90 tablet  3  . chlorpheniramine-HYDROcodone (TUSSIONEX PENNKINETIC ER) 10-8 MG/5ML LQCR Take 5 mLs by mouth every 12 (twelve) hours as needed for cough.  140 mL  0  . cyclobenzaprine (FLEXERIL) 5 MG tablet Take 1 tablet (5 mg total) by mouth 3 (three) times daily as needed for muscle spasms.  30 tablet  2  . estrogens, conjugated, (PREMARIN) 1.25 MG tablet Take 1 tablet (1.25 mg total) by mouth daily.  90 tablet  3  . fluticasone (FLONASE) 50 MCG/ACT nasal spray Place 2 sprays into the nose daily.  48 g  3  . HYDROcodone-acetaminophen (NORCO) 10-325 MG per tablet Take 1 tablet by mouth every 8 (eight) hours as needed.  30 tablet  0  . levocetirizine (XYZAL) 5 MG tablet Take 1 tablet (5 mg total) by mouth every evening.  90 tablet  3  . levofloxacin (LEVAQUIN) 500 MG tablet Take 1 tablet (500 mg total) by mouth daily.  10 tablet  0  . loteprednol (ALREX) 0.2 % SUSP Place 1 drop into both **Note De-Identified [place] Obfuscation** eyes 2 (two) times daily.      . Multiple Vitamin (MULTIVITAMIN) tablet Take 1 tablet by mouth daily.        . nabumetone (RELAFEN) 500 MG tablet Take 1 tablet (500 mg total) by mouth 2 (two) times daily.  180 tablet  3  . omeprazole (PRILOSEC) 20 MG capsule Take 1 capsule (20 mg total) by mouth daily.  30 capsule  11  . sertraline (ZOLOFT) 100 MG tablet Take 1 tablet (100 mg total) by mouth daily as needed.  90 tablet  3  . Vitamin D, Ergocalciferol, (DRISDOL) 50000 UNITS CAPS capsule Take 50,000 Units by mouth 2 (two) times a week.       . mometasone (NASONEX) 50 MCG/ACT nasal spray Place 2 sprays into the nose daily.  17 g  6   No current facility-administered medications on  file prior to visit.    BP 120/70  Pulse 70  Temp(Src) 98.3 F (36.8 C) (Oral)  Ht 5\' 4"  (1.626 m)  Wt 177 lb (80.287 kg)  BMI 30.37 kg/m2  SpO2 98%    Objective:   Physical Exam  Nursing note and vitals reviewed. HENT:  Head: Normocephalic and atraumatic.  Neck: No tracheal deviation present. No thyromegaly present.  Multiple cervical surgeries  Cardiovascular: Normal rate and regular rhythm.   Murmur heard. Pulmonary/Chest: Effort normal and breath sounds normal.  Abdominal: Bowel sounds are normal.  Musculoskeletal: Normal range of motion.          Assessment & Plan:  1. Cervical arthritis and radiculopathy and may have  Surgery  Has done exercizes and PT but did not see improvement  Has not done traction therapy  2. Alllergies and eye problems with allergic conjunctivitis  Zyrtec 10 BID and referral to allergy  3. Diarrhea episode with hallucinosis went to the [facility] urgent care and was treated for Rotavirus  Add probiotic for soft stools and refer to GI  NEEDS colonoscopy!!!!!!!  Low WBC and blood in urine  Follow up needed  Referrals made to GI and PT for cervical

## 2013-10-03 NOTE — Patient Instructions (Addendum)
**Note De-identified [place] Obfuscation** The patient is instructed to continue all medications as prescribed. Schedule followup with check out clerk upon leaving the clinic  

## 2013-10-03 NOTE — Progress Notes (Signed)
**Note De-identified [place] Obfuscation** Pre visit review using our clinic review tool, if applicable. No additional management support is needed unless otherwise documented below in the visit note. 

## 2013-10-05 ENCOUNTER — Ambulatory Visit (INDEPENDENT_AMBULATORY_CARE_PROVIDER_SITE_OTHER): Payer: BC Managed Care – PPO | Admitting: Internal Medicine

## 2013-10-05 ENCOUNTER — Encounter: Payer: Self-pay | Admitting: Internal Medicine

## 2013-10-05 VITALS — BP 152/78 | HR 80 | Ht 64.0 in | Wt 177.8 lb

## 2013-10-05 DIAGNOSIS — Z1211 Encounter for screening for malignant neoplasm of colon: Secondary | ICD-10-CM

## 2013-10-05 DIAGNOSIS — K589 Irritable bowel syndrome without diarrhea: Secondary | ICD-10-CM

## 2013-10-05 DIAGNOSIS — R5383 Other fatigue: Secondary | ICD-10-CM

## 2013-10-05 DIAGNOSIS — R5381 Other malaise: Secondary | ICD-10-CM

## 2013-10-05 MED ORDER — SOD PICOSULFATE-MAG OX-CIT ACD 10-3.5-12 MG-GM-GM PO PACK: 1.0000 | PACK | Freq: Once | ORAL | Status: DC

## 2013-10-05 NOTE — Progress Notes (Signed)
**Note De-Identified [place] Obfuscation** Patient ID: Felicia Schultz, female   DOB: February 05, 1951, 63 y.o.   MRN: [id number]      Consultation  Referring Provider:     Benay Pillow, MD Primary Care Physician:  Georgetta Haber, MD       Reason for Consultation:     Diarrhea     Impression / Plan:   1. IBS (irritable bowel syndrome) - postinfectious   2. Fatigue   3. Special screening for malignant neoplasms, colon    1. Schedule screening colonoscopy The risks and benefits as well as alternatives of endoscopic procedure(s) have been discussed and reviewed. All questions answered. The patient agrees to proceed. Continue supportive care for what I think is diarrhea or soft stools related to previous rotavirus infection and postinfectious IBS Cause of fatigue is not clear, doubt it is related to GI problems           HPI:   Felicia Schultz is a 63 y.o. female who was in her usual state of health until early April when she developed voluminous watery diarrhea and it was very green. She became tired and had crampy abdominal pain. She was seen at [facility] urgent care center where stool pathogen panel eventually showed she had rotavirus. Over time she is improved but she remains fatigue and her stools are somewhat soft. She does think that when she went to [place] in August of 2014, she started having some softer bowel movements but she would offer low carb diet mostly at that point. She's had fluctuating weight but no persistent change. She persists with some bloating. No bleeding reported. She has never had a routine screening colonoscopy. She does have a grandfather with a history of colon cancer and a mother with a history of colon polyps. GI review of systems is otherwise negative. Her history is pertinent for previous gastric bypass procedure for obesity.  Past Medical History  Diagnosis Date  . Diabetes mellitus     type 2  . Depression   . Hypertension   . Hypokalemia     Past Surgical History  Procedure Laterality  Date  . Total abdominal hysterectomy    . Gastric bypass      Family History  Problem Relation Age of Onset  . Diabetes Mother   . Heart disease Mother   . Heart disease Father   . Hypertension Father    colon cancer in a grandfather colon polyps in mother  History  Substance Use Topics  . Smoking status: Never Smoker   . Smokeless tobacco: Never Used  . 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 She is married with children.  Prior to Admission medications   Medication Sig Start Date End Date Taking? Authorizing Provider  cyclobenzaprine (FLEXERIL) 5 MG tablet Take 1 tablet (5 mg total) by mouth 3 (three) times daily as needed for muscle spasms. 03/09/13  Yes Ricard Dillon, MD  dicyclomine (BENTYL) 20 MG tablet Take 20 mg by mouth every 6 (six) hours as needed for spasms.   Yes Historical Provider, MD  estrogens, conjugated, (PREMARIN) 1.25 MG tablet Take 1 tablet (1.25 mg total) by mouth daily. 12/03/12  Yes Ricard Dillon, MD  fluticasone [facility]) 50 MCG/ACT nasal spray Place 2 sprays into the nose daily. 12/03/12  Yes Ricard Dillon, MD  HYDROcodone-acetaminophen [facility]) 10-325 MG per tablet Take 1 tablet by mouth every 8 (eight) hours as needed. 08/10/13  Yes Collier Salina **Note De-Identified [place] Obfuscation** Sherwood Gambler, MD  Multiple Vitamin (MULTIVITAMIN) tablet Take 1 tablet by mouth daily.     Yes Historical Provider, MD  nabumetone (RELAFEN) 500 MG tablet Take 1 tablet (500 mg total) by mouth 2 (two) times daily. 12/03/12  Yes Ricard Dillon, MD  omeprazole (PRILOSEC) 20 MG capsule Take 1 capsule (20 mg total) by mouth daily. 07/11/13  Yes Ricard Dillon, MD  saccharomyces boulardii (FLORASTOR) 250 MG capsule Take 250 mg by mouth daily.   Yes Historical Provider, MD  sertraline (ZOLOFT) 100 MG tablet Take 1 tablet (100 mg total) by mouth daily as needed. 12/03/12  Yes Ricard Dillon, MD  Vitamin D, Ergocalciferol, (DRISDOL) 50000 UNITS CAPS capsule Take 50,000 Units  by mouth 2 (two) times a week.    Yes Historical Provider, MD  mometasone (NASONEX) 50 MCG/ACT nasal spray Place 2 sprays into the nose daily. 10/17/11 10/16/12  Ricard Dillon, MD    Current Outpatient Prescriptions  Medication Sig Dispense Refill  . cyclobenzaprine (FLEXERIL) 5 MG tablet Take 1 tablet (5 mg total) by mouth 3 (three) times daily as needed for muscle spasms.  30 tablet  2  . dicyclomine (BENTYL) 20 MG tablet Take 20 mg by mouth every 6 (six) hours as needed for spasms.      Marland Kitchen estrogens, conjugated, (PREMARIN) 1.25 MG tablet Take 1 tablet (1.25 mg total) by mouth daily.  90 tablet  3  . fluticasone (FLONASE) 50 MCG/ACT nasal spray Place 2 sprays into the nose daily.  48 g  3  . HYDROcodone-acetaminophen (NORCO) 10-325 MG per tablet Take 1 tablet by mouth every 8 (eight) hours as needed.  30 tablet  0  . Multiple Vitamin (MULTIVITAMIN) tablet Take 1 tablet by mouth daily.        . nabumetone (RELAFEN) 500 MG tablet Take 1 tablet (500 mg total) by mouth 2 (two) times daily.  180 tablet  3  . omeprazole (PRILOSEC) 20 MG capsule Take 1 capsule (20 mg total) by mouth daily.  30 capsule  11  . saccharomyces boulardii (FLORASTOR) 250 MG capsule Take 250 mg by mouth daily.      . sertraline (ZOLOFT) 100 MG tablet Take 1 tablet (100 mg total) by mouth daily as needed.  90 tablet  3  . Vitamin D, Ergocalciferol, (DRISDOL) 50000 UNITS CAPS capsule Take 50,000 Units by mouth 2 (two) times a week.       . mometasone (NASONEX) 50 MCG/ACT nasal spray Place 2 sprays into the nose daily.  17 g  6   No current facility-administered medications for this visit.    Allergies as of 10/05/2013 - Review Complete 10/05/2013  Allergen Reaction Noted  . Augmentin [amoxicillin-pot clavulanate]  08/13/2013  . Clarithromycin  11/09/2009  . Nitrofurantoin  03/08/2007  . Sulfonamide derivatives  03/08/2007     Review of Systems:    This is positive for those things mentioned in the HPI, also positive for  headaches, allergy problems in her eyes and nose and the fatigue. All other review of systems negative All other review of systems are negative.       Physical Exam:     General:  Well-developed, well-nourished and in no acute distress - obese Eyes:  anicteric. ENT:   Mouth and posterior pharynx free of lesions.  Neck:   supple w/o thyromegaly or mass.  Lungs: Clear to auscultation bilaterally. Heart:  S1S2, no rubs, murmurs, gallops. Abdomen:  obese, soft, non-tender, no hepatosplenomegaly, hernia, **Note De-Identified [place] Obfuscation** or mass and BS+.  Rectal: Deferred until colonoscopy Lymph:  no cervical or supraclavicular adenopathy. Extremities:   no edema Skin   no rash. Neuro:  A&O x 3.  Psych:  appropriate mood and  Affect.   Data Reviewed:   LAB RESULTS:  Recent Labs  10/03/13 1146  WBC 6.7  HGB 13.3  HCT 40.5  PLT 242.0   BMET  Recent Labs  10/03/13 1146  NA 142  K 3.4*  CL 105  CO2 25  GLUCOSE 76  BUN 16  CREATININE 0.6  CALCIUM 9.3   LFT  Recent Labs  10/03/13 1146  PROT 7.2  ALBUMIN 4.0  AST 20  ALT 18  ALKPHOS 79  BILITOT 0.7   Care Everywhere - + Rotavirus A GI pathogen   Thanks  I appreciate the opportunity to care for this patient.   @Carl  Simonne Maffucci, MD, [facility] @  10/05/2013, 9:29 AM

## 2013-10-05 NOTE — Patient Instructions (Signed)
**Note De-Identified [place] Obfuscation** You have been scheduled for a colonoscopy with propofol. Please follow written instructions given to you at your visit today.  Please use the prep kit you have been given today. If you use inhalers (even only as needed), please bring them with you on the day of your procedure. Your physician has requested that you go to www.startemmi.com and enter the access code given to you at your visit today. This web site gives a general overview about your procedure. However, you should still follow specific instructions given to you by our office regarding your preparation for the procedure.   Continue the medicines your currently on.   I appreciate the opportunity to care for you.

## 2013-10-12 ENCOUNTER — Encounter: Payer: Self-pay | Admitting: Internal Medicine

## 2013-10-25 ENCOUNTER — Encounter: Payer: Self-pay | Admitting: Internal Medicine

## 2013-10-25 DIAGNOSIS — I499 Cardiac arrhythmia, unspecified: Secondary | ICD-10-CM

## 2013-10-28 NOTE — Telephone Encounter (Signed)
**Note De-Identified [place] Obfuscation** Results reviewed by Dr Arnoldo Morale.  Patient referred to cardiology and patient aware.

## 2013-11-01 ENCOUNTER — Telehealth: Payer: Self-pay | Admitting: Internal Medicine

## 2013-11-01 ENCOUNTER — Ambulatory Visit (INDEPENDENT_AMBULATORY_CARE_PROVIDER_SITE_OTHER): Payer: BC Managed Care – PPO | Admitting: Internal Medicine

## 2013-11-01 ENCOUNTER — Encounter: Payer: Self-pay | Admitting: Internal Medicine

## 2013-11-01 VITALS — BP 114/66 | HR 80 | Ht 64.0 in | Wt 177.4 lb

## 2013-11-01 DIAGNOSIS — L500 Allergic urticaria: Secondary | ICD-10-CM

## 2013-11-01 DIAGNOSIS — H16269 Vernal keratoconjunctivitis, with limbar and corneal involvement, unspecified eye: Secondary | ICD-10-CM

## 2013-11-01 DIAGNOSIS — H1044 Vernal conjunctivitis: Secondary | ICD-10-CM

## 2013-11-01 DIAGNOSIS — H16263 Vernal keratoconjunctivitis, with limbar and corneal involvement, bilateral: Secondary | ICD-10-CM

## 2013-11-01 MED ORDER — DOXYCYCLINE HYCLATE 100 MG PO TABS: ORAL_TABLET | ORAL | Status: DC

## 2013-11-01 NOTE — Telephone Encounter (Signed)
**Note De-Identified [place] Obfuscation** Received 28 pages from [place], sent to [employer]. 11/01/13/ss.

## 2013-11-01 NOTE — Progress Notes (Signed)
**Note De-Identified [place] Obfuscation** [birth year]- 89 yoF never smoker referred courtesy of Dr Benay Pillow; has bumps on forehead. Hives on upper eyelids-eye dr gave rx to help-still having same sensation(this happened in Feb 2015) Was skin tested in 90's(Dr Bardelas)  Over the past year has had persistent rash described as multiple small whiteheads on forehead. If upset she feels itching/crawling sensation on the face. Was told she had hives inside her eyelids in early February 2015. Ophthalmologist gave steroid eyedrops which helped only a few days. Eyes feel watery, gritty and burning. She had started contact lenses in February of 2015. She remembers allergy skin testing was strongly positive for environmental allergens. She was on allergy shots for 3 years-big help. She works in an an older building she describes as having moldy areas. Her home has 3 cats and one dog, and no smokers, no mold.  Prior to Admission medications   Medication Sig Start Date End Date Taking? Authorizing Provider  dicyclomine (BENTYL) 20 MG tablet Take 20 mg by mouth every 6 (six) hours as needed for spasms.   Yes Historical Provider, MD  HYDROcodone-acetaminophen (NORCO) 10-325 MG per tablet Take 1 tablet by mouth every 8 (eight) hours as needed. 08/10/13  Yes Marletta Lor, MD  Multiple Vitamin (MULTIVITAMIN) tablet Take 1 tablet by mouth daily.     Yes Historical Provider, MD  atenolol (TENORMIN) 25 MG tablet Take 25 mg by mouth daily.    Historical Provider, MD  cyclobenzaprine (FLEXERIL) 5 MG tablet Take 1 tablet (5 mg total) by mouth 3 (three) times daily as needed for muscle spasms. 12/05/13   Ricard Dillon, MD  estrogens, conjugated, (PREMARIN) 1.25 MG tablet Take 1 tablet (1.25 mg total) by mouth daily. 12/05/13   Ricard Dillon, MD  fluticasone (FLONASE) 50 MCG/ACT nasal spray Place 2 sprays into the nose as needed. 12/03/12   Ricard Dillon, MD  mometasone (NASONEX) 50 MCG/ACT nasal spray Place 2 sprays into the nose as needed. 10/17/11 11/23/13  Ricard Dillon, MD  nabumetone (RELAFEN) 500 MG tablet Take 500 mg by mouth 2 (two) times daily as needed. 12/03/12   Ricard Dillon, MD  omeprazole (PRILOSEC) 20 MG capsule Take 1 capsule (20 mg total) by mouth daily. 12/05/13   Ricard Dillon, MD  sertraline (ZOLOFT) 100 MG tablet Take 1 tablet (100 mg total) by mouth daily as needed. 12/05/13   Ricard Dillon, MD  Vitamin D, Ergocalciferol, (DRISDOL) 50000 UNITS CAPS capsule Take 1 capsule (50,000 Units total) by mouth 2 (two) times a week. 12/05/13   Ricard Dillon, MD   Past Medical History  Diagnosis Date  . Diabetes mellitus     type 2-no longer since gastric in 2005  . Hypertension   . Hypokalemia   . Allergy   . Cancer     basal ca - nose - 1985  . GERD (gastroesophageal reflux disease)   . Personal history of colonic polyps - sessile serrated 11/23/2013   Past Surgical History  Procedure Laterality Date  . Total abdominal hysterectomy    . Gastric bypass    . Appendectomy     Family History  Problem Relation Age of Onset  . Diabetes Mother   . Heart disease Mother   . Heart disease Father   . Hypertension Father   . Emphysema Sister   . Emphysema Maternal Grandmother   . Cancer Maternal Grandmother     throat and lung(smoker)  . Heart disease Brother   . **Note De-Identified Gutierrez Obfuscation** Colon cancer Maternal Grandfather   . Lung cancer Brother     smoker  . Scleroderma Sister   . Diabetes Sister   . Pancreatic cancer Neg Hx   . Rectal cancer Neg Hx   . Stomach cancer Neg Hx    History   Social History  . Marital Status: Married    Spouse Name: N/A    Number of Children: 0  . Years of Education: N/A   Occupational History  . Telecommunication    Social History Main Topics  . Smoking status: Never Smoker   . Smokeless tobacco: Never Used  . Alcohol Use: No  . Drug Use: No  . Sexual Activity: Yes   Other Topics Concern  . Not on file   Social History Narrative   Married, no children   Engineer, drilling for [employer].    Lives in [place].   1-2 caffeine/day   ROS-see HPI Constitutional:   No-   weight loss, night sweats, fevers, chills, fatigue, lassitude. HEENT:   No-  headaches, difficulty swallowing, tooth/dental problems, sore throat,       No-  sneezing, +itching, ear ache, nasal congestion, post nasal drip,  CV:  No-   chest pain, orthopnea, PND, swelling in lower extremities, anasarca,                                  dizziness, +palpitations Resp: No-   shortness of breath with exertion or at rest.              No-   productive cough,  No non-productive cough,  No- coughing up of blood.              No-   change in color of mucus.  No- wheezing.   Skin: No-   rash or lesions. GI:  +heartburn, indigestion, No-abdominal pain, nausea, vomiting, diarrhea,                 change in bowel habits, loss of appetite GU: No-   dysuria, change in color of urine, no urgency or frequency.  No- flank pain. MS:  No-   joint pain or swelling.  No- decreased range of motion.  No- back pain. Neuro-     nothing unusual Psych:  No- change in mood or affect. No depression or anxiety.  No memory loss.  OBJ- Physical Exam General- Alert, Oriented, Affect-appropriate, Distress- none acute Skin- + acne -type rash on forehead Lymphadenopathy- none Head- atraumatic            Eyes- Gross vision intact, PERRLA, conjunctivae and secretions clear            Ears- Hearing, canals-normal            Nose- Clear, no-Septal dev, mucus, polyps, erosion, perforation             Throat- Mallampati II , mucosa clear , drainage- none, tonsils- atrophic Neck- flexible , trachea midline, no stridor , thyroid nl, carotid no bruit Chest - symmetrical excursion , unlabored           Heart/CV- RRR , no murmur , no gallop  , no rub, nl s1 s2                           - JVD- none , edema- none, stasis changes- none, varices- none **Note De-Identified [place]on Obfuscation** Lung- clear to P&A, wheeze- none, cough- none , dullness-none, rub- none           Chest wall-   Abd- tender-no, distended-no, bowel sounds-present, HSM- no Br/ Gen/ Rectal- Not done, not indicated Extrem- cyanosis- none, clubbing, none, atrophy- none, strength- nl Neuro- grossly intact to observation

## 2013-11-01 NOTE — Patient Instructions (Signed)
**Note De-Identified [place] Obfuscation** Try otc allergy eyedrop like Naphcon or Allaway  Try an otc lubricant eye ointment like Lacrilube  You can ask for these at your pharmacist

## 2013-11-14 ENCOUNTER — Encounter: Payer: Self-pay | Admitting: Internal Medicine

## 2013-11-14 ENCOUNTER — Ambulatory Visit (AMBULATORY_SURGERY_CENTER): Payer: BC Managed Care – PPO | Admitting: Internal Medicine

## 2013-11-14 VITALS — BP 132/60 | HR 50 | Temp 98.0°F | Resp 21 | Ht 64.0 in | Wt 177.0 lb

## 2013-11-14 DIAGNOSIS — D126 Benign neoplasm of colon, unspecified: Secondary | ICD-10-CM

## 2013-11-14 DIAGNOSIS — Z1211 Encounter for screening for malignant neoplasm of colon: Secondary | ICD-10-CM

## 2013-11-14 MED ORDER — SODIUM CHLORIDE 0.9 % IV SOLN: 500.0000 mL | INTRAVENOUS | Status: DC

## 2013-11-14 NOTE — Patient Instructions (Addendum)
**Note De-Identified Sindelar Obfuscation** I found and removed two polyps that look benign. Everything else was ok.  I am glad you are feeling better.  I will let you know pathology results and when to have another routine colonoscopy by mail.   I appreciate the opportunity to care for you. Gatha Mayer, MD, FACG   YOU HAD AN ENDOSCOPIC PROCEDURE TODAY AT [facility]: Refer to the procedure report that was given to you for any specific questions about what was found during the examination.  If the procedure report does not answer your questions, please call your gastroenterologist to clarify.  If you requested that your care partner not be given the details of your procedure findings, then the procedure report has been included in a sealed envelope for you to review at your convenience later.  YOU SHOULD EXPECT: Some feelings of bloating in the abdomen. Passage of more gas than usual.  Walking can help get rid of the air that was put into your GI tract during the procedure and reduce the bloating. If you had a lower endoscopy (such as a colonoscopy or flexible sigmoidoscopy) you may notice spotting of blood in your stool or on the toilet paper. If you underwent a bowel prep for your procedure, then you may not have a normal bowel movement for a few days.  DIET: Your first meal following the procedure should be a light meal and then it is ok to progress to your normal diet.  A half-sandwich or bowl of soup is an example of a good first meal.  Heavy or fried foods are harder to digest and may make you feel nauseous or bloated.  Likewise meals heavy in dairy and vegetables can cause extra gas to form and this can also increase the bloating.  Drink plenty of fluids but you should avoid alcoholic beverages for 24 hours.  ACTIVITY: Your care partner should take you home directly after the procedure.  You should plan to take it easy, moving slowly for the rest of the day.  You can resume normal activity the day after the  procedure however you should NOT DRIVE or use heavy machinery for 24 hours (because of the sedation medicines used during the test).    SYMPTOMS TO REPORT IMMEDIATELY: A gastroenterologist can be reached at any hour.  During normal business hours, 8:30 AM to 5:00 PM Monday through Friday, call [phone].  After hours and on weekends, please call the GI answering service at [phone] who will take a message and have the physician on call contact you.   Following lower endoscopy (colonoscopy or flexible sigmoidoscopy):  Excessive amounts of blood in the stool  Significant tenderness or worsening of abdominal pains  Swelling of the abdomen that is new, acute  Fever of 100F or higher  Following upper endoscopy (EGD)  Vomiting of blood or coffee ground material  New chest pain or pain under the shoulder blades  Painful or persistently difficult swallowing  New shortness of breath  Fever of 100F or higher  Black, tarry-looking stools  FOLLOW UP: If any biopsies were taken you will be contacted by phone or by letter within the next 1-3 weeks.  Call your gastroenterologist if you have not heard about the biopsies in 3 weeks.  Our staff will call the home number listed on your records the next business day following your procedure to check on you and address any questions or concerns that you may have at that time regarding the information **Note De-Identified Criscuolo Obfuscation** given to you following your procedure. This is a courtesy call and so if there is no answer at the home number and we have not heard from you through the emergency physician on call, we will assume that you have returned to your regular daily activities without incident.  SIGNATURES/CONFIDENTIALITY: You and/or your care partner have signed paperwork which will be entered into your electronic medical record.  These signatures attest to the fact that that the information above on your After Visit Summary has been reviewed and is understood.  Full  responsibility of the confidentiality of this discharge information lies with you and/or your care-partner.   INFORMATION ON POLYPS GIVEN TO YOU TODAY AWAIT PATHOLOGY RESULTS

## 2013-11-14 NOTE — Progress Notes (Signed)
**Note De-identified Steury Obfuscation** Called to room to assist during endoscopic procedure.  Patient ID and intended procedure confirmed with present staff. Received instructions for my participation in the procedure from the performing physician.  

## 2013-11-14 NOTE — Op Note (Signed)
**Note De-Identified Mcfetridge Obfuscation** [place]  [address] [place], [address]   COLONOSCOPY PROCEDURE REPORT  PATIENT: Felicia Schultz, Felicia Schultz  MR#: [id number] BIRTHDATE: 1951/04/14 , 40  yrs. old GENDER: Female ENDOSCOPIST: Gatha Mayer, MD, [facility] PROCEDURE DATE:  11/14/2013 PROCEDURE:   Colonoscopy with biopsy and snare polypectomy First Screening Colonoscopy - Avg.  risk and is 50 yrs.  old or older Yes.  Prior Negative Screening - Now for repeat screening. N/A  History of Adenoma - Now for follow-up colonoscopy & has been > or = to 3 yrs.  N/A  Polyps Removed Today? Yes. ASA CLASS:   Class II INDICATIONS:average risk screening and first colonoscopy. MEDICATIONS: Propofol (Diprivan) 340 mg IV, MAC sedation, administered by CRNA, and These medications were titrated to patient response per physician's verbal order  DESCRIPTION OF PROCEDURE:   After the risks benefits and alternatives of the procedure were thoroughly explained, informed consent was obtained.  A digital rectal exam revealed no abnormalities of the rectum.   The LB [id number]  endoscope was introduced through the anus and advanced to the cecum, which was identified by both the appendix and ileocecal valve. No adverse events experienced.   The quality of the prep was excellent using Suprep  The instrument was then slowly withdrawn as the colon was fully examined.  COLON FINDINGS: Two sessile polyps measuring 3 and 7 mm in size were found in the ascending colon and sigmoid colon.  A polypectomy was performed with cold forceps and with a cold snare.  The resection was complete and the polyp tissue was completely retrieved.   The colon mucosa was otherwise normal.  Retroflexed views revealed no abnormalities. The time to cecum=3 minutes 57 seconds.  Withdrawal time=10 minutes 01 seconds.  The scope was withdrawn and the procedure completed. COMPLICATIONS: There were no complications.  ENDOSCOPIC IMPRESSION: 1.   Two sessile polyps  measuring 3 and 7 mm in size were found in the ascending colon and sigmoid colon; polypectomy was performed with cold forceps and with a cold snare 2.   The colon mucosa was otherwise normal - excellent prep  RECOMMENDATIONS: Timing of repeat colonoscopy will be determined by pathology findings.   eSigned:  Gatha Mayer, MD, [facility] 11/14/2013 3:18 PM   cc: The Patient and Ricard Dillon, MD

## 2013-11-15 ENCOUNTER — Telehealth: Payer: Self-pay

## 2013-11-15 ENCOUNTER — Telehealth: Payer: Self-pay | Admitting: Internal Medicine

## 2013-11-15 NOTE — Telephone Encounter (Signed)
**Note De-Identified Larrivee Obfuscation** I received a call from pt and she is very upset about the statement she received from [facility].  She would a callback from Dr. Arnoldo Morale to discuss her concerns.  I gave pt the number to [facility] and Geryl Councilman so she can discuss her concerns in further detail and hope to gain understanding. Pt states she does not want to have the contract she signed on file because she does not consent to the testing.

## 2013-11-15 NOTE — Telephone Encounter (Signed)
**Note De-identified Vanbrocklin Obfuscation**  **Note De-Identified Minervini Obfuscation** Follow up Call-  Call back number 11/14/2013  Post procedure Call Back phone  # [phone] hm  Permission to leave phone message Yes     Patient questions:  Do you have a fever, pain , or abdominal swelling? no Pain Score  0 *  Have you tolerated food without any problems? yes  Have you been able to return to your normal activities? yes  Do you have any questions about your discharge instructions: Diet   no Medications  no Follow up visit  no  Do you have questions or concerns about your Care? no  Actions: * If pain score is 4 or above: No action needed, pain <4.  No problems per the pt. Maw

## 2013-11-15 NOTE — Telephone Encounter (Signed)
**Note De-identified Hickmon Obfuscation** Left message for pt to call back  °

## 2013-11-22 NOTE — Telephone Encounter (Signed)
**Note De-identified Melcher Obfuscation** Left message for pt to call back  °

## 2013-11-23 ENCOUNTER — Encounter: Payer: Self-pay | Admitting: Internal Medicine

## 2013-11-23 ENCOUNTER — Ambulatory Visit (INDEPENDENT_AMBULATORY_CARE_PROVIDER_SITE_OTHER): Payer: BC Managed Care – PPO | Admitting: Internal Medicine

## 2013-11-23 VITALS — BP 150/82 | HR 56 | Ht 64.0 in | Wt 175.2 lb

## 2013-11-23 DIAGNOSIS — Z860100 Personal history of colon polyps, unspecified: Secondary | ICD-10-CM

## 2013-11-23 DIAGNOSIS — I4891 Unspecified atrial fibrillation: Secondary | ICD-10-CM

## 2013-11-23 DIAGNOSIS — I4892 Unspecified atrial flutter: Secondary | ICD-10-CM

## 2013-11-23 DIAGNOSIS — R002 Palpitations: Secondary | ICD-10-CM

## 2013-11-23 DIAGNOSIS — I10 Essential (primary) hypertension: Secondary | ICD-10-CM

## 2013-11-23 HISTORY — DX: Personal history of colon polyps, unspecified: Z86.0100

## 2013-11-23 HISTORY — DX: Personal history of colonic polyps: Z86.010

## 2013-11-23 NOTE — Progress Notes (Signed)
**Note De-Identified Mijangos Obfuscation** Quick Note:  2 sessile serrated polyps < 1 cm Repeat colonoscopy 2020 ______

## 2013-11-23 NOTE — Progress Notes (Signed)
**Note De-Identified Felicia Schultz** Primary Care Physician: Georgetta Haber, MD Referring Physician:  Dr Terrall Laity Woolbright is a 63 y.o. female with a h/o palpitations who presents for cardiology consultation.  She reports having palpitations several years ago for which she was placed on atenolol.  This controlled the episodes and was well tolerated.  She had gastric bypass in 2005 and had resolution of palpitations.  She has been off of atenolol since that time.   Recently, she went for treadmill testing through [facility] (lifestyle wellness program).  She reports that she was told that she had very frequent ectopy during her stress test.  She reports that she did not have symptoms of chest pain, palpitations, presyncope, or syncope during the study.  She had gradual resolution of ectopy during recovery.  She has rare palpitations in general at this point. She is walking 5 miles per day or more without difficulty.  She has restarted atenolol since her recent treadmill and is tolerating this without difficulty.  Today, she denies symptoms of chest pain, shortness of breath, orthopnea, PND, lower extremity edema, dizziness, presyncope, syncope, or neurologic sequela. The patient is tolerating medications without difficulties and is otherwise without complaint today.   Past Medical History  Diagnosis Date  . Diabetes mellitus     type 2-no longer since gastric in 2005  . Hypertension   . Hypokalemia   . Allergy   . Cancer     basal ca - nose - 1985  . GERD (gastroesophageal reflux disease)   . Personal history of colonic polyps - sessile serrated 11/23/2013   Past Surgical History  Procedure Laterality Date  . Total abdominal hysterectomy    . Gastric bypass    . Appendectomy      Current Outpatient Prescriptions  Medication Sig Dispense Refill  . atenolol (TENORMIN) 25 MG tablet Take 25 mg by mouth daily.      . cyclobenzaprine (FLEXERIL) 5 MG tablet Take 1 tablet (5 mg total) by mouth 3 (three)  times daily as needed for muscle spasms.  30 tablet  2  . dicyclomine (BENTYL) 20 MG tablet Take 20 mg by mouth every 6 (six) hours as needed for spasms.      Marland Kitchen estrogens, conjugated, (PREMARIN) 1.25 MG tablet Take 1 tablet (1.25 mg total) by mouth daily.  90 tablet  3  . fluticasone (FLONASE) 50 MCG/ACT nasal spray Place 2 sprays into the nose as needed.      Marland Kitchen HYDROcodone-acetaminophen (NORCO) 10-325 MG per tablet Take 1 tablet by mouth every 8 (eight) hours as needed.  30 tablet  0  . mometasone (NASONEX) 50 MCG/ACT nasal spray Place 2 sprays into the nose as needed.      . Multiple Vitamin (MULTIVITAMIN) tablet Take 1 tablet by mouth daily.        . nabumetone (RELAFEN) 500 MG tablet Take 500 mg by mouth 2 (two) times daily as needed.      Marland Kitchen omeprazole (PRILOSEC) 20 MG capsule Take 1 capsule (20 mg total) by mouth daily.  30 capsule  11  . sertraline (ZOLOFT) 100 MG tablet Take 1 tablet (100 mg total) by mouth daily as needed.  90 tablet  3  . Vitamin D, Ergocalciferol, (DRISDOL) 50000 UNITS CAPS capsule Take 50,000 Units by mouth 2 (two) times a week.        No current facility-administered medications for this visit.    Allergies  Allergen Reactions  . Augmentin [Amoxicillin-Pot Clavulanate] **Note De-Identified Gin Schultz** nausea  . Clarithromycin     REACTION: hives  . Nitrofurantoin     Palms and hand itching, turned bright red per pt  . Sulfonamide Derivatives     Abdominal cramping, nausea    History   Social History  . Marital Status: Married    Spouse Name: N/A    Number of Children: 0  . Years of Education: N/A   Occupational History  . Telecommunication    Social History Main Topics  . Smoking status: Never Smoker   . Smokeless tobacco: Never Used  . Alcohol Use: No  . Drug Use: No  . Sexual Activity: Yes   Other Topics Concern  . Not on file   Social History Narrative   Married, no children   Engineer, drilling for [employer].   Lives in [place].   1-2  caffeine/day    Family History  Problem Relation Age of Onset  . Diabetes Mother   . Heart disease Mother   . Heart disease Father   . Hypertension Father   . Emphysema Sister   . Emphysema Maternal Grandmother   . Cancer Maternal Grandmother     throat and lung(smoker)  . Heart disease Brother   . Colon cancer Maternal Grandfather   . Lung cancer Brother     smoker  . Scleroderma Sister   . Diabetes Sister   . Pancreatic cancer Neg Hx   . Rectal cancer Neg Hx   . Stomach cancer Neg Hx   She denies FH of arrhythmia, sudden death, or syncope.  She has a sister who died as a complication of scleroderma  ROS- All systems are reviewed and negative except as per the HPI above  Physical Exam: Filed Vitals:   11/23/13 1112  BP: 150/82  Pulse: 56  Height: 5\' 4"  (1.626 m)  Weight: 175 lb 3.2 oz (79.47 kg)    GEN- The patient is well appearing, alert and oriented x 3 today.   Head- normocephalic, atraumatic Eyes-  Sclera clear, conjunctiva pink Ears- hearing intact Oropharynx- clear Neck- supple, no JVP Lymph- no cervical lymphadenopathy Lungs- Clear to ausculation bilaterally, normal work of breathing Heart- Regular rate and rhythm, no murmurs, rubs or gallops, PMI not laterally displaced GI- soft, NT, ND, + BS Extremities- no clubbing, cyanosis, or edema MS- no significant deformity or atrophy Skin- no rash or lesion Psych- euthymic mood, full affect Neuro- strength and sensation are intact  EKG today reveals sinus bradycardia 156 bpm, PR 152, Qtc 389 I have reviewed recorder from [facility] which reveals that she had exercise induced atrial fibrillation which organized into atrial flutter before converting to sinus rhythm.  I not see significant ventricular arrhythmias.  Assessment and Plan:  1. Exercise induced atrial fibrillation/ atrial flutter This was recently documented during stress testing.  She is typically asymptomatic. I will obtain an echo to evaluate for  structural heart disease.  I will also place a holter monitor to evaluate for asymptomatic atrial arrhythmias.  Her chads2vasc score is at least 3.  I will hold anticoagulation for now though this needs to be a consideration long term. She will return in 4 weeks for further discussion.  I think that it is reasonable for her to proceed with her exercise/ wellness program in the interim.  2. htn Stable No change required today

## 2013-11-23 NOTE — Patient Instructions (Signed)
**Note De-Identified Mauriello Obfuscation** Your physician recommends that you schedule a follow-up appointment in: 4 weeks with Dr Rayann [name]   Your physician has requested that you have an echocardiogram. Echocardiography is a painless test that uses sound waves to create images of your heart. It provides your doctor with information about the size and shape of your heart and how well your heart's chambers and valves are working. This procedure takes approximately one hour. There are no restrictions for this procedure.   Your physician has recommended that you wear a holter monitor. Holter monitors are medical devices that record the heart's electrical activity. Doctors most often use these monitors to diagnose arrhythmias. Arrhythmias are problems with the speed or rhythm of the heartbeat. The monitor is a small, portable device. You can wear one while you do your normal daily activities. This is usually used to diagnose what is causing palpitations/syncope (passing out).

## 2013-11-24 ENCOUNTER — Ambulatory Visit (HOSPITAL_COMMUNITY): Payer: BC Managed Care – PPO | Attending: Internal Medicine | Admitting: Radiology

## 2013-11-24 DIAGNOSIS — R002 Palpitations: Secondary | ICD-10-CM

## 2013-11-24 DIAGNOSIS — I4891 Unspecified atrial fibrillation: Secondary | ICD-10-CM

## 2013-11-24 NOTE — Progress Notes (Signed)
**Note De-identified Femia Obfuscation** Echocardiogram performed.  

## 2013-11-25 NOTE — Telephone Encounter (Signed)
**Note De-Identified Vieth Obfuscation** DPR signed for husband, informed husband to ignore the bill, they did not have to pay.  Explained that when insurance pays for it they write off the rest.  Pt will not have to pay anything out of pocket.  Pts husband verbalized understanding and had no questions.  He said if pt has anymore questions she will call back.  Nothing further is needed at this time

## 2013-11-26 DIAGNOSIS — I4892 Unspecified atrial flutter: Secondary | ICD-10-CM | POA: Insufficient documentation

## 2013-11-26 DIAGNOSIS — I4891 Unspecified atrial fibrillation: Secondary | ICD-10-CM | POA: Insufficient documentation

## 2013-11-29 ENCOUNTER — Encounter: Payer: Self-pay | Admitting: Radiology

## 2013-11-29 ENCOUNTER — Encounter (INDEPENDENT_AMBULATORY_CARE_PROVIDER_SITE_OTHER): Payer: BC Managed Care – PPO

## 2013-11-29 DIAGNOSIS — R002 Palpitations: Secondary | ICD-10-CM

## 2013-11-29 DIAGNOSIS — I4891 Unspecified atrial fibrillation: Secondary | ICD-10-CM

## 2013-11-29 NOTE — Progress Notes (Signed)
**Note De-Identified Eleazer Obfuscation** Patient ID: Felicia Schultz, female   DOB: Jun 12, 1951, 63 y.o.   MRN: [id number] E cardio 48hr holter applied

## 2013-11-30 ENCOUNTER — Encounter: Payer: Self-pay | Admitting: Internal Medicine

## 2013-12-05 ENCOUNTER — Other Ambulatory Visit: Payer: Self-pay | Admitting: Internal Medicine

## 2013-12-05 ENCOUNTER — Telehealth: Payer: Self-pay | Admitting: Internal Medicine

## 2013-12-05 ENCOUNTER — Encounter: Payer: Self-pay | Admitting: Internal Medicine

## 2013-12-05 DIAGNOSIS — M62838 Other muscle spasm: Secondary | ICD-10-CM

## 2013-12-05 MED ORDER — OMEPRAZOLE 20 MG PO CPDR
20.0000 mg | DELAYED_RELEASE_CAPSULE | Freq: Every day | ORAL | Status: DC
Start: 2013-12-05 — End: 2015-01-23

## 2013-12-05 MED ORDER — CYCLOBENZAPRINE HCL 5 MG PO TABS: 5.0000 mg | ORAL_TABLET | Freq: Three times a day (TID) | ORAL | Status: DC | PRN

## 2013-12-05 MED ORDER — ESTROGENS CONJUGATED 1.25 MG PO TABS: 1.2500 mg | ORAL_TABLET | Freq: Every day | ORAL | Status: DC

## 2013-12-05 MED ORDER — SERTRALINE HCL 100 MG PO TABS: 100.0000 mg | ORAL_TABLET | Freq: Every day | ORAL | Status: DC | PRN

## 2013-12-05 MED ORDER — VITAMIN D (ERGOCALCIFEROL) 1.25 MG (50000 UNIT) PO CAPS: 50000.0000 [IU] | ORAL_CAPSULE | ORAL | Status: DC

## 2013-12-05 NOTE — Telephone Encounter (Signed)
**Note De-identified Abril Obfuscation** Error/gd °

## 2013-12-18 ENCOUNTER — Encounter: Payer: Self-pay | Admitting: Internal Medicine

## 2013-12-18 DIAGNOSIS — H16263 Vernal keratoconjunctivitis, with limbar and corneal involvement, bilateral: Secondary | ICD-10-CM | POA: Insufficient documentation

## 2013-12-18 NOTE — Assessment & Plan Note (Signed)
**Note De-Identified Brodhead Obfuscation** Rash he identifies as abnormal looks like a very fine acne on her forehead, not typical urticaria. We don't identify obvious cosmetic or hair treatment exposures that might trigger Plan-treat as acne with doxycycline and watch for effect. gentle soap and water.

## 2013-12-18 NOTE — Assessment & Plan Note (Signed)
**Note De-Identified Stuck Obfuscation** Symptoms began suspiciously close to beginning of contact lens wearing. She follows with her eye Dr. For this issue. Plan-Try without contact lenses, use an OTC eyedrop since insurance won't pay for Patanol, add a lubricant eye gel

## 2013-12-22 ENCOUNTER — Encounter: Payer: Self-pay | Admitting: Internal Medicine

## 2013-12-22 ENCOUNTER — Ambulatory Visit (INDEPENDENT_AMBULATORY_CARE_PROVIDER_SITE_OTHER): Payer: BC Managed Care – PPO | Admitting: Internal Medicine

## 2013-12-22 VITALS — BP 140/72 | HR 60 | Ht 64.0 in | Wt 175.1 lb

## 2013-12-22 DIAGNOSIS — I4892 Unspecified atrial flutter: Secondary | ICD-10-CM

## 2013-12-22 DIAGNOSIS — I10 Essential (primary) hypertension: Secondary | ICD-10-CM

## 2013-12-22 DIAGNOSIS — I4891 Unspecified atrial fibrillation: Secondary | ICD-10-CM

## 2013-12-22 DIAGNOSIS — I483 Typical atrial flutter: Secondary | ICD-10-CM

## 2013-12-22 NOTE — Patient Instructions (Signed)
**Note De-Identified Borrayo Obfuscation** Your physician recommends that you continue on your current medications as directed. Please refer to the Current Medication list given to you today. Your physician wants you to follow-up in: 3 months with Dr. Rayann [name].   You will receive a reminder letter in the mail two months in advance. If you don't receive a letter, please call our office to schedule the follow-up appointment.

## 2013-12-25 ENCOUNTER — Encounter: Payer: Self-pay | Admitting: Internal Medicine

## 2013-12-25 NOTE — Progress Notes (Signed)
**Note De-Identified Obeso Obfuscation** Primary Care Physician: Felicia Haber, MD Referring Physician:  Dr Terrall Laity Felicia Schultz is a 63 y.o. female with a h/o palpitations who presents for cardiology follow-up. She was recenty found to have atrial flutter during stress testing.  Holter monitor placed by me has documented paroxysmal atrial flutter as well as atrial fibrillation.  She was mostly asymptomatic with this.  She has rare palpitations.  She also has some fatigue.  She snores. Today, she denies symptoms of chest pain, shortness of breath, orthopnea, PND, lower extremity edema, dizziness, presyncope, syncope, or neurologic sequela. The patient is tolerating medications without difficulties and is otherwise without complaint today.   Past Medical History  Diagnosis Date  . Diabetes mellitus     type 2-no longer since gastric in 2005  . Hypertension   . Hypokalemia   . Allergy   . Cancer     basal ca - nose - 1985  . GERD (gastroesophageal reflux disease)   . Personal history of colonic polyps - sessile serrated 11/23/2013   Past Surgical History  Procedure Laterality Date  . Total abdominal hysterectomy    . Gastric bypass    . Appendectomy      Current Outpatient Prescriptions  Medication Sig Dispense Refill  . atenolol (TENORMIN) 25 MG tablet Take 25 mg by mouth daily.      . cyclobenzaprine (FLEXERIL) 5 MG tablet Take 1 tablet (5 mg total) by mouth 3 (three) times daily as needed for muscle spasms.  30 tablet  2  . dicyclomine (BENTYL) 20 MG tablet Take 20 mg by mouth every 6 (six) hours as needed for spasms.      Marland Kitchen estrogens, conjugated, (PREMARIN) 1.25 MG tablet Take 1 tablet (1.25 mg total) by mouth daily.  90 tablet  3  . fluticasone (FLONASE) 50 MCG/ACT nasal spray Place 2 sprays into the nose as needed.      Marland Kitchen HYDROcodone-acetaminophen (NORCO) 10-325 MG per tablet Take 1 tablet by mouth every 8 (eight) hours as needed.  30 tablet  0  . mometasone (NASONEX) 50 MCG/ACT nasal spray Place 2 sprays into  the nose as needed.      . Multiple Vitamin (MULTIVITAMIN) tablet Take 1 tablet by mouth daily.        . nabumetone (RELAFEN) 500 MG tablet Take 500 mg by mouth 2 (two) times daily as needed.      Marland Kitchen omeprazole (PRILOSEC) 20 MG capsule Take 1 capsule (20 mg total) by mouth daily.  30 capsule  11  . sertraline (ZOLOFT) 100 MG tablet Take 1 tablet (100 mg total) by mouth daily as needed.  90 tablet  3  . Vitamin D, Ergocalciferol, (DRISDOL) 50000 UNITS CAPS capsule Take 1 capsule (50,000 Units total) by mouth 2 (two) times a week.  30 capsule  0   No current facility-administered medications for this visit.    Allergies  Allergen Reactions  . Augmentin [Amoxicillin-Pot Clavulanate]     nausea  . Clarithromycin     REACTION: hives  . Nitrofurantoin     Palms and hand itching, turned bright red per pt  . Sulfonamide Derivatives     Abdominal cramping, nausea    History   Social History  . Marital Status: Married    Spouse Name: N/A    Number of Children: 0  . Years of Education: N/A   Occupational History  . Telecommunication    Social History Main Topics  . Smoking status: Never Smoker   . **Note De-Identified Lierman Obfuscation** Smokeless tobacco: Never Used  . Alcohol Use: No  . Drug Use: No  . Sexual Activity: Yes   Other Topics Concern  . 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   1-2 caffeine/day    Family History  Problem Relation Age of Onset  . Diabetes Mother   . Heart disease Mother   . Heart disease Father   . Hypertension Father   . Emphysema Sister   . Emphysema Maternal Grandmother   . Cancer Maternal Grandmother     throat and lung(smoker)  . Heart disease Brother   . Colon cancer Maternal Grandfather   . Lung cancer Brother     smoker  . Scleroderma Sister   . Diabetes Sister   . Pancreatic cancer Neg Hx   . Rectal cancer Neg Hx   . Stomach cancer Neg Hx   She denies FH of arrhythmia,  sudden death, or syncope.  She has a sister who died as a complication of scleroderma  ROS- All systems are reviewed and negative except as per the HPI above  Physical Exam: Filed Vitals:   12/22/13 1607  BP: 140/72  Pulse: 60  Height: 5\' 4"  (1.626 m)  Weight: 175 lb 1.9 oz (79.434 kg)    GEN- The patient is well appearing, alert and oriented x 3 today.   Head- normocephalic, atraumatic Eyes-  Sclera clear, conjunctiva pink Ears- hearing intact Oropharynx- clear Neck- supple, no JVP Lymph- no cervical lymphadenopathy Lungs- Clear to ausculation bilaterally, normal work of breathing Heart- Regular rate and rhythm, no murmurs, rubs or gallops, PMI not laterally displaced GI- soft, NT, ND, + BS Extremities- no clubbing, cyanosis, or edema Neuro- strength and sensation are intact  holter monitor is reviewed with the patient 11/24/13 echo is also reviewed with her  Assessment and Plan:  1. Paroxysmal atrial fibrillation/ atrial flutter  She is typically asymptomatic but does have some palpitations.. Her chads2vasc score is at least 3. I have recommended anticoagulation at this point however she is clear in her decision to avoid this.  The importance of lifestyle modification was discussed today.  She will work on regular exercise and weight reduction.  2. htn Stable No change required today  3. Snoring/ fatigue She is not interested in sleep study at this time  Return in 3 months for follow-up She will contact my office if problems arise in the interim

## 2014-01-11 ENCOUNTER — Telehealth: Payer: Self-pay | Admitting: Internal Medicine

## 2014-01-11 MED ORDER — ATENOLOL 25 MG PO TABS: 25.0000 mg | ORAL_TABLET | Freq: Every day | ORAL | Status: DC

## 2014-01-11 NOTE — Telephone Encounter (Signed)
**Note De-identified Weldy Obfuscation** rx sent in electronically 

## 2014-01-11 NOTE — Telephone Encounter (Signed)
**Note De-Identified Mcquillen Obfuscation** Phar is calling because pt needs refill on atenolol 25 mg

## 2014-03-17 ENCOUNTER — Other Ambulatory Visit: Payer: Self-pay | Admitting: Internal Medicine

## 2014-03-20 ENCOUNTER — Ambulatory Visit (INDEPENDENT_AMBULATORY_CARE_PROVIDER_SITE_OTHER): Payer: BC Managed Care – PPO | Admitting: Internal Medicine

## 2014-03-20 ENCOUNTER — Encounter: Payer: Self-pay | Admitting: Internal Medicine

## 2014-03-20 VITALS — BP 132/80 | HR 59 | Ht 64.0 in | Wt 175.0 lb

## 2014-03-20 DIAGNOSIS — R5383 Other fatigue: Secondary | ICD-10-CM

## 2014-03-20 DIAGNOSIS — I4891 Unspecified atrial fibrillation: Secondary | ICD-10-CM

## 2014-03-20 NOTE — Progress Notes (Signed)
**Note De-Identified [place] Obfuscation** Primary Care Physician: Georgetta Haber, MD Referring Physician:  Dr Terrall Laity Felicia Schultz is a 63 y.o. female with a h/o palpitations who presents for cardiology follow-up. She was recenty found to have atrial flutter during stress testing.  Holter monitor placed by me has documented paroxysmal atrial flutter as well as atrial fibrillation.  She was mostly asymptomatic with this. Blood thinners were discussed with pt as well and she deferred.  She is back today for f/u. She reports no awareness of palpitations.Current chadsvasc score is one for female. Prior to her gastric bypass in 2005 she had diabetes and HTN, both resolved with weight loss. She has been on atenolol long term for palpitations, "since my 30"s" Bp normal today at 130/80.  Today, she denies symptoms of chest pain, shortness of breath, orthopnea, PND, lower extremity edema, dizziness, presyncope, syncope, or neurologic sequela. The patient is tolerating medications without difficulties and is otherwise without complaint today.   Past Medical History  Diagnosis Date  . Diabetes mellitus     type 2-no longer since gastric in 2005  . Hypertension   . Hypokalemia   . Allergy   . Cancer     basal ca - nose - 1985  . GERD (gastroesophageal reflux disease)   . Personal history of colonic polyps - sessile serrated 11/23/2013  . Paroxysmal atrial fibrillation   . Atrial flutter     typical appearing   Past Surgical History  Procedure Laterality Date  . Total abdominal hysterectomy    . Gastric bypass    . Appendectomy      Current Outpatient Prescriptions  Medication Sig Dispense Refill  . atenolol (TENORMIN) 25 MG tablet Take 1 tablet (25 mg total) by mouth daily.  90 tablet  1  . Biotin 5000 MCG CAPS Take 2 capsules by mouth daily.      . cyclobenzaprine (FLEXERIL) 5 MG tablet Take 1 tablet (5 mg total) by mouth 3 (three) times daily as needed for muscle spasms.  30 tablet  2  . dicyclomine (BENTYL) 20 MG tablet Take  20 mg by mouth every 6 (six) hours as needed for spasms.      Marland Kitchen estrogens, conjugated, (PREMARIN) 1.25 MG tablet Take 1 tablet (1.25 mg total) by mouth daily.  90 tablet  3  . fluticasone (FLONASE) 50 MCG/ACT nasal spray Place 2 sprays into the nose as needed for allergies or rhinitis.       Marland Kitchen HYDROcodone-acetaminophen (NORCO) 10-325 MG per tablet Take 1 tablet by mouth every 8 (eight) hours as needed.  30 tablet  0  . Multiple Vitamin (MULTIVITAMIN) tablet Take 1 tablet by mouth daily.        . nabumetone (RELAFEN) 500 MG tablet Take 500 mg by mouth 2 (two) times daily as needed (pain).       Marland Kitchen omeprazole (PRILOSEC) 20 MG capsule Take 1 capsule (20 mg total) by mouth daily.  30 capsule  11  . sertraline (ZOLOFT) 100 MG tablet Take 100 mg by mouth daily.      . Vitamin D, Ergocalciferol, (DRISDOL) 50000 UNITS CAPS capsule TAKE 1 CAPSULE BY MOUTH TWICE A WEEK  30 capsule  3   No current facility-administered medications for this visit.    Allergies  Allergen Reactions  . Augmentin [Amoxicillin-Pot Clavulanate] Nausea Only    Severe nausea. Couldn't keep medication down  . Clarithromycin Hives  . Nitrofurantoin Other (See Comments)    Palms and hand itching, turned bright red per **Note De-Identified [place] Obfuscation** pt  . Sulfonamide Derivatives Other (See Comments)    Abdominal cramping, nausea    History   Social History  . Marital Status: Married    Spouse Name: N/A    Number of Children: 0  . Years of Education: N/A   Occupational History  . Telecommunication    Social History Main Topics  . Smoking status: Never Smoker   . Smokeless tobacco: Never Used  . Alcohol Use: No  . Drug Use: No  . Sexual Activity: Yes   Other Topics Concern  . 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   1-2 caffeine/day    Family History  Problem Relation Age of Onset  . Diabetes Mother   . Heart disease Mother   . Heart disease  Father   . Hypertension Father   . Emphysema Sister   . Emphysema Maternal Grandmother   . Cancer Maternal Grandmother     throat and lung(smoker)  . Heart disease Brother   . Colon cancer Maternal Grandfather   . Lung cancer Brother     smoker  . Scleroderma Sister   . Diabetes Sister   . Pancreatic cancer Neg Hx   . Rectal cancer Neg Hx   . Stomach cancer Neg Hx   She denies FH of arrhythmia, sudden death, or syncope.  She has a sister who died as a complication of scleroderma  ROS- All systems are reviewed and negative except as per the HPI above  Physical Exam: Filed Vitals:   03/20/14 1430  BP: 132/80  Pulse: 59  Height: 5\' 4"  (1.626 m)  Weight: 175 lb (79.379 kg)    GEN- The patient is well appearing, alert and oriented x 3 today.   Head- normocephalic, atraumatic Eyes-  Sclera clear, conjunctiva pink Ears- hearing intact Oropharynx- clear Neck- supple, no JVP Lymph- no cervical lymphadenopathy Lungs- Clear to ausculation bilaterally, normal work of breathing Heart- Regular rate and rhythm, no murmurs, rubs or gallops, PMI not laterally displaced GI- soft, NT, ND, + BS Extremities- no clubbing, cyanosis, or edema Neuro- strength and sensation are intact  EKG-NSR with one PAC noted.64 bpm, 11/24/13 echo reviewed  Assessment and Plan:  1. Paroxysmal atrial fibrillation/ atrial flutter She is currently unaware of any palpitations.. She has cut out caffeine and thinks that has helped. Clearly pt had more risk factors for stroke prior  to gastric bypass and weight loss, but current chadsvasc score is one. Discussed with pt today that her risk taking anticoagulants may be higher than current risk of stroke.She would like to stay on a baby asa and as she ages or develops other symptoms or comorbidities, the benefit  would then outweigh the risk.  2. Snoring/ fatigue She is not interested in sleep study at this time Plans to lose the 20 lbs that she has regained over  the last year due to a foot injury.  Return in 4 months AF clinic  She will contact  the office if problems arise in the interim

## 2014-03-20 NOTE — Patient Instructions (Signed)
**Note De-Identified [place] Obfuscation** Your physician recommends that you schedule a follow-up appointment in: 4 months with Roderic Palau, NP in [place] clinic

## 2014-05-30 DIAGNOSIS — Z0000 Encounter for general adult medical examination without abnormal findings: Secondary | ICD-10-CM | POA: Insufficient documentation

## 2014-07-19 ENCOUNTER — Ambulatory Visit (INDEPENDENT_AMBULATORY_CARE_PROVIDER_SITE_OTHER): Payer: BLUE CROSS/BLUE SHIELD | Admitting: Nurse Practitioner

## 2014-07-19 VITALS — BP 124/82 | HR 90 | Ht 64.0 in | Wt 177.0 lb

## 2014-07-19 DIAGNOSIS — K589 Irritable bowel syndrome without diarrhea: Secondary | ICD-10-CM | POA: Insufficient documentation

## 2014-07-19 DIAGNOSIS — E559 Vitamin D deficiency, unspecified: Secondary | ICD-10-CM | POA: Insufficient documentation

## 2014-07-19 DIAGNOSIS — I480 Paroxysmal atrial fibrillation: Secondary | ICD-10-CM

## 2014-07-19 DIAGNOSIS — Z9884 Bariatric surgery status: Secondary | ICD-10-CM | POA: Insufficient documentation

## 2014-07-19 DIAGNOSIS — M47812 Spondylosis without myelopathy or radiculopathy, cervical region: Secondary | ICD-10-CM | POA: Insufficient documentation

## 2014-07-19 DIAGNOSIS — F419 Anxiety disorder, unspecified: Secondary | ICD-10-CM | POA: Insufficient documentation

## 2014-07-19 DIAGNOSIS — K219 Gastro-esophageal reflux disease without esophagitis: Secondary | ICD-10-CM | POA: Insufficient documentation

## 2014-07-19 DIAGNOSIS — J330 Polyp of nasal cavity: Secondary | ICD-10-CM | POA: Insufficient documentation

## 2014-07-19 MED ORDER — RIVAROXABAN 20 MG PO TABS: 20.0000 mg | ORAL_TABLET | Freq: Every day | ORAL | Status: DC

## 2014-07-19 NOTE — Patient Instructions (Addendum)
**Note De-Identified [place] Obfuscation** Your physician recommends that you continue on your current medications as directed. Please refer to the Current Medication list given to you today.  Your physician recommends that you schedule a follow-up appointment in: 1 month with Roderic Palau, NP

## 2014-07-20 NOTE — Progress Notes (Signed)
**Note De-Identified [place] Obfuscation** Primary Care Physician: Maryjean Ka, MD Referring Physician:  Dr Terrall Laity Felicia Schultz is a 64 y.o. female with a h/o palpitations who presents for cardiology follow-up. She was recenty found to have atrial flutter during stress testing.  Holter monitor placed by Dr. Rayann [name] has documented paroxysmal atrial flutter as well as atrial fibrillation.  She was mostly asymptomatic with this. Blood thinners have been discussed with pt as well and she deferred.  Current chadsvasc score is one for female. Prior to her gastric bypass in 2005 she had diabetes and HTN, both resolved with weight loss. She has been on atenolol long term for palpitations, "since my 30"s"   Today, EKG shows rate controlled atrial flutter, pt is unaware. But states she stays tired. Some snoring history but sleeps soundly at night.  Today, she denies symptoms of chest pain, shortness of breath, orthopnea, PND, lower extremity edema, dizziness, presyncope, syncope, or neurologic sequela. The patient is tolerating medications without difficulties and is otherwise without complaint today.   Past Medical History  Diagnosis Date  . Diabetes mellitus     type 2-no longer since gastric in 2005  . Hypertension   . Hypokalemia   . Allergy   . Cancer     basal ca - nose - 1985  . GERD (gastroesophageal reflux disease)   . Personal history of colonic polyps - sessile serrated 11/23/2013  . Paroxysmal atrial fibrillation   . Atrial flutter     typical appearing   Past Surgical History  Procedure Laterality Date  . Total abdominal hysterectomy    . Gastric bypass    . Appendectomy      Current Outpatient Prescriptions  Medication Sig Dispense Refill  . atenolol (TENORMIN) 25 MG tablet Take 1 tablet (25 mg total) by mouth daily. 90 tablet 1  . Biotin 5000 MCG CAPS Take 2 capsules by mouth daily.    . cyclobenzaprine (FLEXERIL) 5 MG tablet Take 1 tablet (5 mg total) by mouth 3 (three) times daily as needed for muscle  spasms. 30 tablet 2  . dicyclomine (BENTYL) 20 MG tablet Take 20 mg by mouth every 6 (six) hours as needed for spasms.    Marland Kitchen estrogens, conjugated, (PREMARIN) 1.25 MG tablet Take 1 tablet (1.25 mg total) by mouth daily. 90 tablet 3  . fluticasone (FLONASE) 50 MCG/ACT nasal spray Place 2 sprays into the nose as needed for allergies or rhinitis.     . Multiple Vitamin (MULTIVITAMIN) tablet Take 1 tablet by mouth daily.      Marland Kitchen omeprazole (PRILOSEC) 20 MG capsule Take 1 capsule (20 mg total) by mouth daily. 30 capsule 11  . sertraline (ZOLOFT) 100 MG tablet Take 100 mg by mouth daily.    . Vitamin D, Ergocalciferol, (DRISDOL) 50000 UNITS CAPS capsule TAKE 1 CAPSULE BY MOUTH TWICE A WEEK 30 capsule 3   No current facility-administered medications for this visit.    Allergies  Allergen Reactions  . Augmentin [Amoxicillin-Pot Clavulanate] Nausea Only    Severe nausea. Couldn't keep medication down Severe nausea. Couldn't keep medication down  . Clarithromycin Hives    Unknown  . Nitrofurantoin Other (See Comments)    Palms and hand itching, turned bright red per pt  . Sulfonamide Derivatives Other (See Comments)    Abdominal cramping, nausea  . Nitrofurantoin Macrocrystal Swelling    Unknown  . Sulfa Antibiotics Nausea And Vomiting    Unknown    History   Social History  . Marital **Note De-Identified [place] Obfuscation** Status: Married    Spouse Name: N/A    Number of Children: 0  . Years of Education: N/A   Occupational History  . Telecommunication    Social History Main Topics  . Smoking status: Never Smoker   . Smokeless tobacco: Never Used  . Alcohol Use: No  . Drug Use: No  . Sexual Activity: Yes   Other Topics Concern  . Not on file   Social History Narrative   Married, no children   Engineer, drilling for [employer].   Lives in [place].   1-2 caffeine/day    Family History  Problem Relation Age of Onset  . Diabetes Mother   . Heart disease Mother   . Heart disease Father   .  Hypertension Father   . Emphysema Sister   . Emphysema Maternal Grandmother   . Cancer Maternal Grandmother     throat and lung(smoker)  . Heart disease Brother   . Colon cancer Maternal Grandfather   . Lung cancer Brother     smoker  . Scleroderma Sister   . Diabetes Sister   . Pancreatic cancer Neg Hx   . Rectal cancer Neg Hx   . Stomach cancer Neg Hx   She denies FH of arrhythmia, sudden death, or syncope.  She has a sister who died as a complication of scleroderma  ROS- All systems are reviewed and negative except as per the HPI above  Physical Exam: Filed Vitals:   07/19/14 1611  BP: 124/82  Pulse: 90  Height: 5\' 4"  (1.626 m)  Weight: 177 lb (80.287 kg)  SpO2: 95%    GEN- The patient is well appearing, alert and oriented x 3 today.   Head- normocephalic, atraumatic Eyes-  Sclera clear, conjunctiva pink Ears- hearing intact Oropharynx- clear Neck- supple, no JVP Lymph- no cervical lymphadenopathy Lungs- Clear to ausculation bilaterally, normal work of breathing Heart- Regular rate and rhythm, no murmurs, rubs or gallops, PMI not laterally displaced GI- soft, NT, ND, + BS Extremities- no clubbing, cyanosis, or edema Neuro- strength and sensation are intact  EKG-Atrial flutter with vari 11/24/13 echo reviewed  Assessment and Plan:  1. Paroxysmal atrial fibrillation/ atrial flutter  She is currently unaware of any palpitations. However, high burden of atrial flutter may be contributing to fatigue.Therapeutic strategies for aflutter including  ablation was discussed in detail with the patient today.  Risk, benefits, and alternatives to EP study and radiofrequency ablation for aflutter were also discussed in detail today. These risks include but are not limited to stroke, bleeding, vascular damage. The patient understands these risk and wishes to proceed.  Yhe patient would have to start NOAC for 3 weeks before and several months after procedure. She currently has an  ENT appointment next week for nasal polyps which are currently causing intermittent nose bleeds. Noac may cause significant nose bleeds in this situation. Will delay plans for ablation until after polyps can be further evaluated.    Return in one month and will proceed with ablation.

## 2014-07-26 ENCOUNTER — Telehealth: Payer: Self-pay | Admitting: *Deleted

## 2014-07-26 MED ORDER — RIVAROXABAN 20 MG PO TABS: 20.0000 mg | ORAL_TABLET | Freq: Every day | ORAL | Status: DC

## 2014-07-26 NOTE — Telephone Encounter (Signed)
**Note De-Identified [place] Obfuscation** Felicia Palau, NP spoke with patient and she has been cleared by ENT.  Will call in Xarelto 20 mg and schedule her for a flutter ablation in 3 weeks

## 2014-08-08 ENCOUNTER — Other Ambulatory Visit: Payer: Self-pay | Admitting: *Deleted

## 2014-08-08 ENCOUNTER — Encounter: Payer: Self-pay | Admitting: *Deleted

## 2014-08-08 DIAGNOSIS — I4892 Unspecified atrial flutter: Secondary | ICD-10-CM

## 2014-08-30 ENCOUNTER — Other Ambulatory Visit (INDEPENDENT_AMBULATORY_CARE_PROVIDER_SITE_OTHER): Payer: BLUE CROSS/BLUE SHIELD | Admitting: *Deleted

## 2014-08-30 DIAGNOSIS — I4892 Unspecified atrial flutter: Secondary | ICD-10-CM

## 2014-08-30 LAB — CBC WITH DIFFERENTIAL/PLATELET
Basophils Absolute: 0 10*3/uL (ref 0.0–0.1)
Basophils Relative: 0.5 % (ref 0.0–3.0)
Eosinophils Absolute: 0.1 10*3/uL (ref 0.0–0.7)
Eosinophils Relative: 1.3 % (ref 0.0–5.0)
HCT: 36.7 % (ref 36.0–46.0)
Hemoglobin: 12.5 g/dL (ref 12.0–15.0)
Lymphocytes Relative: 25.7 % (ref 12.0–46.0)
Lymphs Abs: 1.4 10*3/uL (ref 0.7–4.0)
MCHC: 34.1 g/dL (ref 30.0–36.0)
MCV: 92.7 fl (ref 78.0–100.0)
Monocytes Absolute: 0.3 10*3/uL (ref 0.1–1.0)
Monocytes Relative: 5.1 % (ref 3.0–12.0)
Neutro Abs: 3.7 10*3/uL (ref 1.4–7.7)
Neutrophils Relative %: 67.4 % (ref 43.0–77.0)
Platelets: 176 10*3/uL (ref 150.0–400.0)
RBC: 3.96 Mil/uL (ref 3.87–5.11)
RDW: 13.6 % (ref 11.5–15.5)
WBC: 5.4 10*3/uL (ref 4.0–10.5)

## 2014-08-30 LAB — BASIC METABOLIC PANEL
BUN: 18 mg/dL (ref 6–23)
CO2: 30 mEq/L (ref 19–32)
Calcium: 8.6 mg/dL (ref 8.4–10.5)
Chloride: 108 mEq/L (ref 96–112)
Creatinine, Ser: 0.63 mg/dL (ref 0.40–1.20)
GFR: 101.97 mL/min (ref >60.00)
Glucose, Bld: 137 mg/dL — ABNORMAL HIGH (ref 70–99)
Potassium: 3.6 mEq/L (ref 3.5–5.1)
Sodium: 140 mEq/L (ref 135–145)

## 2014-08-30 NOTE — Addendum Note (Signed)
**Note De-Identified [place] Obfuscation** Addended by: Eulis Foster on: 08/30/2014 10:32 AM   Modules accepted: Orders

## 2014-09-04 ENCOUNTER — Encounter (HOSPITAL_COMMUNITY): Payer: Self-pay | Admitting: Pharmacy Technician

## 2014-09-05 ENCOUNTER — Ambulatory Visit (HOSPITAL_COMMUNITY): Payer: BLUE CROSS/BLUE SHIELD | Admitting: Anesthesiology

## 2014-09-05 ENCOUNTER — Encounter (HOSPITAL_COMMUNITY): Payer: Self-pay

## 2014-09-05 ENCOUNTER — Observation Stay (HOSPITAL_COMMUNITY)
Admission: RE | Admit: 2014-09-05 | Discharge: 2014-09-05 | Disposition: A | Discharge status: Home / Self Care | Payer: BLUE CROSS/BLUE SHIELD | Source: Ambulatory Visit | Attending: Internal Medicine | Admitting: Internal Medicine

## 2014-09-05 ENCOUNTER — Encounter (HOSPITAL_COMMUNITY)
Admission: RE | Disposition: A | Discharge status: Home / Self Care | Payer: Self-pay | Source: Ambulatory Visit | Attending: Internal Medicine

## 2014-09-05 DIAGNOSIS — I483 Typical atrial flutter: Principal | ICD-10-CM | POA: Insufficient documentation

## 2014-09-05 DIAGNOSIS — I480 Paroxysmal atrial fibrillation: Secondary | ICD-10-CM | POA: Insufficient documentation

## 2014-09-05 DIAGNOSIS — Z85828 Personal history of other malignant neoplasm of skin: Secondary | ICD-10-CM | POA: Insufficient documentation

## 2014-09-05 DIAGNOSIS — Z90710 Acquired absence of both cervix and uterus: Secondary | ICD-10-CM | POA: Insufficient documentation

## 2014-09-05 DIAGNOSIS — Z79899 Other long term (current) drug therapy: Secondary | ICD-10-CM | POA: Diagnosis not present

## 2014-09-05 DIAGNOSIS — I10 Essential (primary) hypertension: Secondary | ICD-10-CM | POA: Insufficient documentation

## 2014-09-05 DIAGNOSIS — K219 Gastro-esophageal reflux disease without esophagitis: Secondary | ICD-10-CM | POA: Insufficient documentation

## 2014-09-05 DIAGNOSIS — Z9884 Bariatric surgery status: Secondary | ICD-10-CM | POA: Insufficient documentation

## 2014-09-05 DIAGNOSIS — I4892 Unspecified atrial flutter: Secondary | ICD-10-CM | POA: Diagnosis present

## 2014-09-05 HISTORY — PX: ATRIAL FLUTTER ABLATION: SHX5733

## 2014-09-05 LAB — GLUCOSE, CAPILLARY: Glucose-Capillary: 93 mg/dL (ref 70–99)

## 2014-09-05 SURGERY — ATRIAL FLUTTER ABLATION
Anesthesia: Monitor Anesthesia Care

## 2014-09-05 MED ORDER — RIVAROXABAN 20 MG PO TABS
20.0000 mg | ORAL_TABLET | Freq: Every day | ORAL | Status: DC
  Administered 2014-09-05: 20 mg via ORAL
  Filled 2014-09-05: qty 1

## 2014-09-05 MED ORDER — FENTANYL CITRATE 0.05 MG/ML IJ SOLN
INTRAMUSCULAR | Status: DC | PRN
  Administered 2014-09-05: 50 ug via INTRAVENOUS
  Administered 2014-09-05: 25 ug via INTRAVENOUS
  Administered 2014-09-05: 50 ug via INTRAVENOUS
  Administered 2014-09-05 (×3): 25 ug via INTRAVENOUS
  Administered 2014-09-05: 50 ug via INTRAVENOUS

## 2014-09-05 MED ORDER — PROPOFOL 10 MG/ML IV BOLUS
INTRAVENOUS | Status: DC | PRN
  Administered 2014-09-05: 30 mg via INTRAVENOUS

## 2014-09-05 MED ORDER — ACETAMINOPHEN 325 MG PO TABS: 650.0000 mg | ORAL_TABLET | ORAL | Status: DC | PRN

## 2014-09-05 MED ORDER — ATENOLOL 25 MG PO TABS: 12.5000 mg | ORAL_TABLET | Freq: Every day | ORAL | Status: DC

## 2014-09-05 MED ORDER — ONDANSETRON HCL 4 MG/2ML IJ SOLN: 4.0000 mg | Freq: Four times a day (QID) | INTRAMUSCULAR | Status: DC | PRN

## 2014-09-05 MED ORDER — PROPOFOL INFUSION 10 MG/ML OPTIME
INTRAVENOUS | Status: DC | PRN
  Administered 2014-09-05: 75 ug/kg/min via INTRAVENOUS

## 2014-09-05 MED ORDER — MIDAZOLAM HCL 5 MG/5ML IJ SOLN
INTRAMUSCULAR | Status: DC | PRN
  Administered 2014-09-05: 2 mg via INTRAVENOUS

## 2014-09-05 MED ORDER — SODIUM CHLORIDE 0.9 % IV SOLN: 250.0000 mL | INTRAVENOUS | Status: DC | PRN

## 2014-09-05 MED ORDER — LACTATED RINGERS IV SOLN
INTRAVENOUS | Status: DC | PRN
  Administered 2014-09-05: 08:00:00 via INTRAVENOUS

## 2014-09-05 MED ORDER — SODIUM CHLORIDE 0.9 % IJ SOLN: 3.0000 mL | Freq: Two times a day (BID) | INTRAMUSCULAR | Status: DC

## 2014-09-05 MED ORDER — HYDROCODONE-ACETAMINOPHEN 5-325 MG PO TABS: 1.0000 | ORAL_TABLET | ORAL | Status: DC | PRN

## 2014-09-05 MED ORDER — BUPIVACAINE HCL (PF) 0.25 % IJ SOLN
INTRAMUSCULAR | Status: AC
  Filled 2014-09-05: qty 30

## 2014-09-05 MED ORDER — SERTRALINE HCL 100 MG PO TABS
100.0000 mg | ORAL_TABLET | Freq: Every day | ORAL | Status: DC
  Administered 2014-09-05: 100 mg via ORAL
  Filled 2014-09-05: qty 1

## 2014-09-05 MED ORDER — SODIUM CHLORIDE 0.9 % IJ SOLN: 3.0000 mL | INTRAMUSCULAR | Status: DC | PRN

## 2014-09-05 MED ORDER — EPHEDRINE SULFATE 50 MG/ML IJ SOLN
INTRAMUSCULAR | Status: DC | PRN
  Administered 2014-09-05 (×2): 5 mg via INTRAVENOUS

## 2014-09-05 NOTE — Progress Notes (Addendum)
**Note De-Identified [place] Obfuscation** SBP 80's/90's. Cuff repositioned without change.Pt asymptomatic. Right groin level zero.

## 2014-09-05 NOTE — Op Note (Signed)
**Note De-Identified Felicia Schultz** PREPROCEDURE DIAGNOSIS: Typical Atrial flutter.   POSTPROCEDURE DIAGNOSIS: Isthmus dependant right atria flutter  PROCEDURES:  1. Comprehensive electrophysiology study.  2. Coronary sinus pacing and recording.  3. Mapping of atrial flutter.  5. Ablation of atrial flutter.  6. Arrhythmia induction with pacing.   INTRODUCTION: Felicia Schultz is a 64 y.o. female with symptomatic typical atrial flutter. She presents today for EP study and radiofrequency ablation.   DESCRIPTION OF PROCEDURE: Informed written consent was obtained, and the patient was brought to the Electrophysiology Lab in the fasting state. She was adequately sedated as outlined in  the anesthesia report. The patient's right groin was prepped and draped in the usual sterile fashion by the EP lab staff. Using a percutaneous Seldinger technique, one 7, one 7, and one 8-French hemostasis sheaths were placed in the right common femoral vein. A 7-French Biosence Webster decapolar catheter was introduced through the right common femoral vein and advanced into the coronary sinus for recording and pacing from this location. A 6-French quadripolar Josephson catheter was introduced through the right common femoral vein and advanced into the right ventricle for recording and pacing, but this catheter was then pulled back to the His bundle location.   Initial Measurements: The patient presented to the electrophysiology lab in sinus rhythm. Her PR interval measured 154 msec with a QRS duration of 78 msec and a QT interval 405 msec. Her RR interval measured 1316 msec. Her AH interval measured 91 msec with an HV interval of 40 msec.   Pacing Maneuvers: Ventricular pacing was performed which revealed VA dissociation when pacing at 600 msec. Rapid atrial pacing was performed which revealed an AV Wenckebach cycle length of 480 msec. There was no evidence of PR greater than RR. Rapid atrial pacing was continued down to a cycle length of 220 msec with  atrial flutter induced.  The atrial flutter CL was 259 msec with proximal to distal CS activation suggesting right atrial flutter.  Classic atrial flutter was demonstrated on surface ekg.  Entrainment from the LA was attempted with atrial pacing however left atrial capture could not be demonstrated.  With entrainment from the CTI, the PPI was equal to the TCL suggesting isthmus dependant right atrial flutter.  Atrial flutter spontaneously terminated with additional attempts of entrainment from the left atrium.  Ablation: I therefore elected to proceed with cavotricuspid isthmus ablation. A 7-French [employer] II XP 10-mm ablation catheter was introduced through the right common femoral vein and advanced into the right atrium. Atrial mapping along the cavotricuspid isthmus was performed. This demonstrated a standard CavoTricuspid isthmus. A series of 6 radiofrequency applications were delivered along the cavotricuspid isthmus with a target temperature of 60 degrees at 70 watts for 120 seconds each. Complete bidirectional isthmus block was achieved with a stimulus to earliest atrial activation recorded bidirectional across the isthmus measuring 150 msec. A duodecapolar circular mapping catheter was introduced through the right common femoral vein and advanced into the right atrium. This Halo catheter was then positioned around the cavotricuspid isthmus. Differential atrial pacing was again performed which revealed complete bidirectional isthmus block. The patient was  observed for 20 minutes without return of conduction through the isthmus.   Measurements Post ablation: Following ablation, the AH interval measured 82 msec with an HV interval of 40 msec. The AV Wenckebach cycle length was 480 msec. Rapid atrial pacing was again performed down to a cycle length of 200 msec without any arrhythmias observed today. The procedure was therefore considered **Note De-Identified Felicia Schultz** completed. All catheters were removed and the  sheaths were aspirated and flushed. The sheaths were removed and hemostasis was assured. EBL<26ml. There were no early apparent complications.    CONCLUSIONS:  1. Sinus rhythm upon presentation.  2. Isthmus dependant right atrial flutter induced today 3. Cavotricuspid isthmus ablation was performed with complete bidirectional isthmus block achieved.  4. No inducible arrhythmias following ablation.  5. No early apparent complications.   Thompson Grayer MD, [facility] 09/05/2014 9:01 AM

## 2014-09-05 NOTE — H&P (Signed)
**Note De-Identified [place] Obfuscation** Primary Care Physician: Felicia Ka, MD Referring Physician: Dr Felicia Schultz Felicia Schultz is a 64 y.o. female with a h/o palpitations who presents for atrial flutter ablation. She was recenty found to have atrial flutter during stress testing. Holter monitor placed by Dr. Rayann [name] has documented paroxysmal atrial flutter as well as atrial fibrillation. She was mostly asymptomatic with this.  Current chadsvasc score is one for female. Prior to her gastric bypass in 2005 she had diabetes and HTN, both resolved with weight loss. She has been on atenolol long term for palpitations, "since my 30"s"    Today, she denies symptoms of chest pain, shortness of breath, orthopnea, PND, lower extremity edema, dizziness, presyncope, syncope, or neurologic sequela. The patient is tolerating medications without difficulties and is otherwise without complaint today.   Past Medical History  Diagnosis Date  . Diabetes mellitus     type 2-no longer since gastric in 2005  . Hypertension   . Hypokalemia   . Allergy   . Cancer     basal ca - nose - 1985  . GERD (gastroesophageal reflux disease)   . Personal history of colonic polyps - sessile serrated 11/23/2013  . Paroxysmal atrial fibrillation   . Atrial flutter     typical appearing   Past Surgical History  Procedure Laterality Date  . Total abdominal hysterectomy    . Gastric bypass    . Appendectomy      Current Outpatient Prescriptions  Medication Sig Dispense Refill  . atenolol (TENORMIN) 25 MG tablet Take 1 tablet (25 mg total) by mouth daily. 90 tablet 1  . Biotin 5000 MCG CAPS Take 2 capsules by mouth daily.    . cyclobenzaprine (FLEXERIL) 5 MG tablet Take 1 tablet (5 mg total) by mouth 3 (three) times daily as needed for muscle spasms. 30 tablet 2  . dicyclomine (BENTYL) 20 MG tablet Take 20 mg by mouth every 6 (six) hours as needed for spasms.    Marland Kitchen estrogens,  conjugated, (PREMARIN) 1.25 MG tablet Take 1 tablet (1.25 mg total) by mouth daily. 90 tablet 3  . fluticasone (FLONASE) 50 MCG/ACT nasal spray Place 2 sprays into the nose as needed for allergies or rhinitis.     . Multiple Vitamin (MULTIVITAMIN) tablet Take 1 tablet by mouth daily.     Marland Kitchen omeprazole (PRILOSEC) 20 MG capsule Take 1 capsule (20 mg total) by mouth daily. 30 capsule 11  . sertraline (ZOLOFT) 100 MG tablet Take 100 mg by mouth daily.    . Vitamin D, Ergocalciferol, (DRISDOL) 50000 UNITS CAPS capsule TAKE 1 CAPSULE BY MOUTH TWICE A WEEK 30 capsule 3   No current facility-administered medications for this visit.    Allergies  Allergen Reactions  . Augmentin [Amoxicillin-Pot Clavulanate] Nausea Only    Severe nausea. Couldn't keep medication down Severe nausea. Couldn't keep medication down  . Clarithromycin Hives    Unknown  . Nitrofurantoin Other (See Comments)    Palms and hand itching, turned bright red per pt  . Sulfonamide Derivatives Other (See Comments)    Abdominal cramping, nausea  . Nitrofurantoin Macrocrystal Swelling    Unknown  . Sulfa Antibiotics Nausea And Vomiting    Unknown    History   Social History  . Marital Status: Married    Spouse Name: N/A    Number of Children: 0  . Years of Education: N/A   Occupational History  . Telecommunication    Social History Main Topics  . Smoking status: Never Smoker   . **Note De-Identified [place] Obfuscation** Smokeless tobacco: Never Used  . Alcohol Use: No  . Drug Use: No  . Sexual Activity: Yes   Other Topics Concern  . 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   1-2 caffeine/day    Family History  Problem Relation Age of Onset  . Diabetes Mother   . Heart disease Mother   . Heart disease  Father   . Hypertension Father   . Emphysema Sister   . Emphysema Maternal Grandmother   . Cancer Maternal Grandmother     throat and lung(smoker)  . Heart disease Brother   . Colon cancer Maternal Grandfather   . Lung cancer Brother     smoker  . Scleroderma Sister   . Diabetes Sister   . Pancreatic cancer Neg Hx   . Rectal cancer Neg Hx   . Stomach cancer Neg Hx   She denies FH of arrhythmia, sudden death, or syncope. She has a sister who died as a complication of scleroderma  ROS- All systems are reviewed and negative except as per the HPI above  Physical Exam: Filed Vitals:   07/19/14 1611                  Filed Vitals:   09/05/14 0553  BP: 124/65  Pulse: 50  Temp: 98.2 F (36.8 C)  Resp: 18   GEN- The patient is well appearing, alert and oriented x 3 today.  Head- normocephalic, atraumatic Eyes- Sclera clear, conjunctiva pink Ears- hearing intact Oropharynx- clear Neck- supple, no JVP Lymph- no cervical lymphadenopathy Lungs- Clear to ausculation bilaterally, normal work of breathing Heart- Regular rate and rhythm, no murmurs, rubs or gallops, PMI not laterally displaced GI- soft, NT, ND, + BS Extremities- no clubbing, cyanosis, or edema Neuro- strength and sensation are intact   Assessment and Plan:  1. Paroxysmal atrial fibrillation/ atrial flutter Predominant arrhythmia is typical appearing atrial flutter.  Very little afib.  Will proceed with atrial flutter ablation and medical management for atrial fibrillation as previously discussed. Therapeutic strategies for atrial flutter including medicine and ablation were discussed in detail with the patient today. Risk, benefits, and alternatives to EP study and radiofrequency ablation were also discussed in detail today. These risks include but are not limited to stroke, bleeding, vascular damage, tamponade, perforation, damage to the heart and  other structures, AV block requiring pacemaker, worsening renal function, and death. The patient understands these risk and wishes to proceed.  She has been compliant with uninterupted xarelto.

## 2014-09-05 NOTE — Anesthesia Postprocedure Evaluation (Signed)
**Note De-identified Gutkowski Obfuscation**  **Note De-Identified Fusselman Obfuscation** Anesthesia Post-op Note  Patient: Felicia Schultz  Procedure(s) Performed: Procedure(s): ATRIAL FLUTTER ABLATION (N/A)  Patient Location: Cath Lab  Anesthesia Type:MAC  Level of Consciousness: awake, alert , oriented and patient cooperative  Airway and Oxygen Therapy: Patient Spontanous Breathing and Patient connected to nasal cannula oxygen  Post-op Pain: none  Post-op Assessment: Post-op Vital signs reviewed, Patient's Cardiovascular Status Stable, Respiratory Function Stable, Patent Airway, No signs of Nausea or vomiting and Pain level controlled  Post-op Vital Signs: Reviewed and stable  Last Vitals:  Filed Vitals:   09/05/14 1443  BP: 96/59  Pulse: 52  Temp: 36.7 C  Resp: 16    Complications: No apparent anesthesia complications

## 2014-09-05 NOTE — Progress Notes (Signed)
**Note De-Identified Fein Obfuscation** Dr Rayann [name] in. Bedside ultrasound done.

## 2014-09-05 NOTE — Progress Notes (Signed)
**Note De-Identified Huot Obfuscation** Doing well s/p ablation No concerns Exam unchanged  Wants to go home  Continue xarelto Stop ASA Decrease atenolol to 12.5mg  daily  Follow-up with me in 4 weeks  Thompson Grayer MD, [facility] 09/05/2014 6:13 PM

## 2014-09-05 NOTE — Progress Notes (Signed)
**Note De-Identified Szilagyi Obfuscation** Site area: RFV x3 Site Prior to Removal:  Level 0 Pressure Applied For:74min Manual:  yes  Patient Status During Pull: stable  Post Pull Site:  Level Post Pull Instructions Given:  yes Post Pull Pulses Present: palpable Dressing Applied:  clear Bedrest begins @ 1015 Comments:

## 2014-09-05 NOTE — Anesthesia Preprocedure Evaluation (Addendum)
**Note De-Identified Kraynak Obfuscation** Anesthesia Evaluation  Patient identified by MRN, date of birth, ID band Patient awake    Reviewed: Allergy & Precautions, NPO status , Patient's Chart, lab work & pertinent test results, reviewed documented beta blocker date and time   History of Anesthesia Complications Negative for: history of anesthetic complications  Airway Mallampati: II  TM Distance: >3 FB Neck ROM: Full    Dental  (+) Teeth Intact, Dental Advisory Given   Pulmonary neg pulmonary ROS,  breath sounds clear to auscultation        Cardiovascular hypertension, Pt. on medications and Pt. on home beta blockers - angina+ dysrhythmias Atrial Fibrillation Rhythm:Regular Rate:Normal  '15 ECHO: EF 55-60%, valves OK '15 exercise stress test: no ischemic EKG changes    Neuro/Psych negative neurological ROS     GI/Hepatic Neg liver ROS, GERD-  Medicated and Controlled,  Endo/Other  diabetes (diet controlled)Morbid obesity (s/p gastric bypass)  Renal/GU negative Renal ROS     Musculoskeletal  (+) Arthritis -,   Abdominal (+) + obese,   Peds  Hematology negative hematology ROS (+)   Anesthesia Other Findings   Reproductive/Obstetrics                            Anesthesia Physical Anesthesia Plan  ASA: III  Anesthesia Plan: MAC   Post-op Pain Management:    Induction: Intravenous  Airway Management Planned: Natural Airway and Simple Face Mask  Additional Equipment: Arterial line  Intra-op Plan:   Post-operative Plan:   Informed Consent:   Dental advisory given  Plan Discussed with: CRNA and Surgeon  Anesthesia Plan Comments: (Plan routine monitors, A line, MAC)        Anesthesia Quick Evaluation

## 2014-09-05 NOTE — Transfer of Care (Signed)
**Note De-Identified Mcloud Obfuscation** Immediate Anesthesia Transfer of Care Note  Patient: Felicia Schultz  Procedure(s) Performed: Procedure(s): ATRIAL FLUTTER ABLATION (N/A)  Patient Location: Cath Lab  Anesthesia Type:MAC  Level of Consciousness: awake, alert  and oriented  Airway & Oxygen Therapy: Patient Spontanous Breathing  Post-op Assessment: Report given to RN and Post -op Vital signs reviewed and stable  Post vital signs: Reviewed and stable  Last Vitals:  Filed Vitals:   09/05/14 0913  BP: 143/63  Pulse: 56  Temp:   Resp: 19    Complications: No apparent anesthesia complications

## 2014-09-05 NOTE — Discharge Instructions (Addendum)
**Note De-Identified Crissman Obfuscation** Cath Site Care Refer to this sheet in the next few weeks. These instructions provide you with information on caring for yourself after your procedure. Your caregiver may also give you more specific instructions. Your treatment has been planned according to current medical practices, but problems sometimes occur. Call your caregiver if you have any problems or questions after your procedure. HOME CARE INSTRUCTIONS  You may shower the day after the procedure.Remove the bandage (dressing) and gently wash the site with plain soap and water.Gently pat the site dry.  Do not apply powder or lotion to the site.  Do not submerge the affected site in water for 3 to 5 days.  Inspect the site at least twice daily.  No lifting over 5 pounds (2.3 kg) for 5 days after your procedure.  Do not drive home if you are discharged the same day of the procedure. Have someone else drive you.  You may drive 24 hours after the procedure unless otherwise instructed by your caregiver.  Do not operate machinery or power tools for 24 hours.  A responsible adult should be with you for the first 24 hours after you arrive home. What to expect:  Any bruising will usually fade within 1 to 2 weeks.  Blood that collects in the tissue (hematoma) may be painful to the touch. It should usually decrease in size and tenderness within 1 to 2 weeks. SEEK IMMEDIATE MEDICAL CARE IF:  You have unusual pain at the radial site.  You have redness, warmth, swelling, or pain at the radial site.  You have drainage (other than a small amount of blood on the dressing).  You have chills.  You have a fever or persistent symptoms for more than 72 hours.  You have a fever and your symptoms suddenly get worse.  Your arm becomes pale, cool, tingly, or numb.  You have heavy bleeding from the site. Hold pressure on the site. Document Released: 07/05/2010 Document Revised: 08/25/2011 Document Reviewed: 07/05/2010 [facility] Patient  Information 2015 [place], [place]. This information is not intended to replace advice given to you by your health care provider. Make sure you discuss any questions you have with your health care provider.

## 2014-09-05 NOTE — Progress Notes (Signed)
**Note De-Identified Rennie Obfuscation** Pt was discharged with medication list, discharge instructions, and educational material. Her IV was removed and taken off of telemetry. She was sent home with her belongings and paperwork. Pt's  Husband took her home.

## 2014-10-11 ENCOUNTER — Encounter: Payer: Self-pay | Admitting: Internal Medicine

## 2014-10-11 ENCOUNTER — Ambulatory Visit (INDEPENDENT_AMBULATORY_CARE_PROVIDER_SITE_OTHER): Payer: BLUE CROSS/BLUE SHIELD | Admitting: Internal Medicine

## 2014-10-11 ENCOUNTER — Other Ambulatory Visit: Payer: Self-pay

## 2014-10-11 VITALS — BP 112/64 | HR 60 | Ht 64.0 in | Wt 179.8 lb

## 2014-10-11 DIAGNOSIS — I480 Paroxysmal atrial fibrillation: Secondary | ICD-10-CM | POA: Diagnosis not present

## 2014-10-11 NOTE — Progress Notes (Signed)
**Note De-Identified Steger Obfuscation** Electrophysiology Office Note   Date:  10/11/2014   ID:  Makenzey Nanni Heffelfinger, DOB February 20, 1951, MRN [id number]  PCP:  Maryjean Ka, MD   Primary Electrophysiologist: Thompson Grayer, MD    Chief Complaint  Patient presents with  . Atrial Fibrillation  . Atrial Flutter     History of Present Illness: Felicia Schultz is a 64 y.o. female who presents today for electrophysiology evaluation.   Doing well s/p ablation.  Denies procedure related complications.  Maintaining sinus rhythm.  Energy is much improved with sinus.  She is pleased with results of ablation.  Today, she denies symptoms of palpitations, chest pain, shortness of breath, orthopnea, PND, lower extremity edema, claudication, dizziness, presyncope, syncope, bleeding, or neurologic sequela. The patient is tolerating medications without difficulties and is otherwise without complaint today.    Past Medical History  Diagnosis Date  . Diabetes mellitus     type 2-no longer since gastric in 2005  . Hypertension   . Hypokalemia   . Allergy   . Cancer     basal ca - nose - 1985  . GERD (gastroesophageal reflux disease)   . Personal history of colonic polyps - sessile serrated 11/23/2013  . Paroxysmal atrial fibrillation   . Atrial flutter     typical appearing   Past Surgical History  Procedure Laterality Date  . Total abdominal hysterectomy    . Gastric bypass    . Appendectomy    . Atrial flutter ablation N/A 09/05/2014    Procedure: ATRIAL FLUTTER ABLATION;  Surgeon: Thompson Grayer, MD;  Location: [facility] CATH LAB;  Service: Cardiovascular;  Laterality: N/A;     Current Outpatient Prescriptions  Medication Sig Dispense Refill  . atenolol (TENORMIN) 25 MG tablet Take 0.5 tablets (12.5 mg total) by mouth daily. 90 tablet 1  . Biotin 5000 MCG CAPS Take 10,000 mcg by mouth daily.     . cyclobenzaprine (FLEXERIL) 5 MG tablet Take 1 tablet (5 mg total) by mouth 3 (three) times daily as needed for muscle spasms. 30 tablet 2  . dicyclomine  (BENTYL) 20 MG tablet Take 20 mg by mouth every 6 (six) hours as needed for spasms.    Marland Kitchen estrogens, conjugated, (PREMARIN) 1.25 MG tablet Take 1/2 tablet by mouth daily    . fluticasone (FLONASE) 50 MCG/ACT nasal spray Place 2 sprays into the nose daily as needed for allergies or rhinitis.     . Multiple Vitamin (MULTIVITAMIN) tablet Take 2 tablets by mouth daily.     Marland Kitchen omeprazole (PRILOSEC) 20 MG capsule Take 1 capsule (20 mg total) by mouth daily. 30 capsule 11  . rivaroxaban (XARELTO) 20 MG TABS tablet Take 1 tablet (20 mg total) by mouth daily with supper. 30 tablet 11  . sertraline (ZOLOFT) 100 MG tablet Take 100 mg by mouth daily.    . Vitamin D, Ergocalciferol, (DRISDOL) 50000 UNITS CAPS capsule Take 50,000 Units by mouth 2 (two) times a week. Sunday and wednesday     No current facility-administered medications for this visit.    Allergies:   Augmentin; Clarithromycin; Nitrofurantoin; Sulfonamide derivatives; Nitrofurantoin macrocrystal; and Sulfa antibiotics   Social History:  The patient  reports that she has never smoked. She has never used smokeless tobacco. She reports that she does not drink alcohol or use illicit drugs.   Family History:  The patient's  family history includes Cancer in her maternal grandmother; Colon cancer in her maternal grandfather; Diabetes in her mother and sister; Emphysema in her maternal **Note De-Identified Doleman Obfuscation** grandmother and sister; Heart disease in her brother, father, and mother; Hypertension in her father; Lung cancer in her brother; Scleroderma in her sister. There is no history of Pancreatic cancer, Rectal cancer, or Stomach cancer.    ROS:  Please see the history of present illness.   All other systems are reviewed and negative.    PHYSICAL EXAM: VS:  BP 112/64 mmHg  Pulse 60  Ht 5\' 4"  (1.626 m)  Wt 179 lb 12.8 oz (81.557 kg)  BMI 30.85 kg/m2 , BMI Body mass index is 30.85 kg/(m^2). GEN: Well nourished, well developed, in no acute distress HEENT: normal Neck: no  JVD, carotid bruits, or masses Cardiac: RRR; no murmurs, rubs, or gallops,no edema  Respiratory:  clear to auscultation bilaterally, normal work of breathing GI: soft, nontender, nondistended, + BS MS: no deformity or atrophy Skin: warm and dry  Neuro:  Strength and sensation are intact Psych: euthymic mood, full affect  EKG:  EKG is ordered today. The ekg ordered today shows sinus rhythm   Recent Labs: 08/30/2014: BUN 18; Creatinine 0.63; Hemoglobin 12.5; Platelets 176.0; Potassium 3.6; Sodium 140    Lipid Panel     Component Value Date/Time   CHOL 153 04/04/2013 1010   TRIG 52.0 04/04/2013 1010   HDL 79.70 04/04/2013 1010   CHOLHDL 2 04/04/2013 1010   VLDL 10.4 04/04/2013 1010   LDLCALC 63 04/04/2013 1010     Wt Readings from Last 3 Encounters:  10/11/14 179 lb 12.8 oz (81.557 kg)  09/05/14 175 lb (79.379 kg)  07/19/14 177 lb (80.287 kg)    ASSESSMENT AND PLAN:  1. Paroxysmal atrial fibrillation Doing well s/p atrial flutter ablation. She is currently unaware of any palpitations..  chads2vasc score is 1.  Stop xarelto at this time Stop atenolol  2. Snoring  She is not interested in sleep study at this time  3. Overweight Regular exercise and weight reduction are strongly encouraged I have encouraged her to return to normal activity  Follow every 3-6 months in the AF clinic I will see as needed going forward She will contact the office if problems arise in the interim   Current medicines are reviewed at length with the patient today.   The patient does not have concerns regarding her medicines.  The following changes were made today:  none   Signed, Thompson Grayer, MD  10/11/2014 10:08 AM     [facility] [address] [place] [address] [phone] (office) [phone] (fax)

## 2014-10-11 NOTE — Patient Instructions (Signed)
**Note De-Identified Switalski Obfuscation** Medication Instructions:  Your physician has recommended you make the following change in your medication: 1) Stop Atenolol 2) Stop Xarelto   Labwork: None ordered  Testing/Procedures: None ordered  Follow-Up: Your physician recommends that you schedule a follow-up appointment in: 3 months with Roderic Palau, NP   Any Other Special Instructions Will Be Listed Below (If Applicable).  Increase exercise which will help with weight reduction

## 2015-01-23 ENCOUNTER — Encounter (HOSPITAL_COMMUNITY): Payer: Self-pay | Admitting: Nurse Practitioner

## 2015-01-23 ENCOUNTER — Ambulatory Visit (HOSPITAL_COMMUNITY)
Admission: RE | Admit: 2015-01-23 | Discharge: 2015-01-23 | Disposition: A | Discharge status: Home / Self Care | Payer: BLUE CROSS/BLUE SHIELD | Source: Ambulatory Visit | Attending: Nurse Practitioner | Admitting: Nurse Practitioner

## 2015-01-23 ENCOUNTER — Other Ambulatory Visit: Payer: Self-pay

## 2015-01-23 VITALS — BP 138/88 | HR 65 | Ht 64.0 in | Wt 180.0 lb

## 2015-01-23 DIAGNOSIS — I4892 Unspecified atrial flutter: Secondary | ICD-10-CM | POA: Diagnosis not present

## 2015-01-23 DIAGNOSIS — Z9884 Bariatric surgery status: Secondary | ICD-10-CM | POA: Diagnosis not present

## 2015-01-23 DIAGNOSIS — I483 Typical atrial flutter: Secondary | ICD-10-CM | POA: Diagnosis not present

## 2015-01-23 DIAGNOSIS — K219 Gastro-esophageal reflux disease without esophagitis: Secondary | ICD-10-CM | POA: Insufficient documentation

## 2015-01-23 DIAGNOSIS — Z833 Family history of diabetes mellitus: Secondary | ICD-10-CM | POA: Insufficient documentation

## 2015-01-23 DIAGNOSIS — Z882 Allergy status to sulfonamides status: Secondary | ICD-10-CM | POA: Insufficient documentation

## 2015-01-23 DIAGNOSIS — Z825 Family history of asthma and other chronic lower respiratory diseases: Secondary | ICD-10-CM | POA: Insufficient documentation

## 2015-01-23 DIAGNOSIS — I10 Essential (primary) hypertension: Secondary | ICD-10-CM | POA: Diagnosis not present

## 2015-01-23 DIAGNOSIS — Z8249 Family history of ischemic heart disease and other diseases of the circulatory system: Secondary | ICD-10-CM | POA: Diagnosis not present

## 2015-01-23 NOTE — Progress Notes (Signed)
**Note De-Identified Vanbrocklin Obfuscation** Patient ID: Felicia Schultz, female   DOB: March 20, 1951, 64 y.o.   MRN: [id number]     Primary Care Physician: Maryjean Ka, MD Referring Physician: Dr. Marina Goodell Roskelley is a 64 y.o. female with a h/o atrial flutter ablation March 2015. She was sen in f/u with Dr. Rayann [name] in April and was doing well. Xarelto and atenolol were stopped for a chadsvasc score of 1. She still reports abstinence of irregular heart beat.  Today, she denies symptoms of palpitations, chest pain, shortness of breath, orthopnea, PND, lower extremity edema, dizziness, presyncope, syncope, or neurologic sequela. The patient is tolerating medications without difficulties and is otherwise without complaint today.   Past Medical History  Diagnosis Date  . Diabetes mellitus     type 2-no longer since gastric in 2005  . Hypertension   . Hypokalemia   . Allergy   . Cancer     basal ca - nose - 1985  . GERD (gastroesophageal reflux disease)   . Personal history of colonic polyps - sessile serrated 11/23/2013  . Paroxysmal atrial fibrillation   . Atrial flutter     typical appearing   Past Surgical History  Procedure Laterality Date  . Total abdominal hysterectomy    . Gastric bypass    . Appendectomy    . Atrial flutter ablation N/A 09/05/2014    Procedure: ATRIAL FLUTTER ABLATION;  Surgeon: Thompson Grayer, MD;  Location: [facility] CATH LAB;  Service: Cardiovascular;  Laterality: N/A;    No current outpatient prescriptions on file.   No current facility-administered medications for this encounter.    Allergies  Allergen Reactions  . Augmentin [Amoxicillin-Pot Clavulanate] Nausea Only    Severe nausea. Couldn't keep medication down   . Clarithromycin Hives  . Nitrofurantoin Other (See Comments)    Palms and hand itching, turned bright red per pt  . Sulfonamide Derivatives Other (See Comments)    Abdominal cramping, nausea  . Nitrofurantoin Macrocrystal Swelling  . Sulfa Antibiotics Nausea And Vomiting    History     Social History  . Marital Status: Married    Spouse Name: N/A  . Number of Children: 0  . Years of Education: N/A   Occupational History  . Telecommunication    Social History Main Topics  . Smoking status: Never Smoker   . Smokeless tobacco: Never Used  . Alcohol Use: No  . Drug Use: No  . Sexual Activity: Yes    Birth Control/ Protection: Condom   Other Topics Concern  . Not on file   Social History Narrative   Married, no children   Engineer, drilling for [employer].   Lives in [place].   1-2 caffeine/day    Family History  Problem Relation Age of Onset  . Diabetes Mother   . Heart disease Mother   . Heart disease Father   . Hypertension Father   . Emphysema Sister   . Emphysema Maternal Grandmother   . Cancer Maternal Grandmother     throat and lung(smoker)  . Heart disease Brother   . Colon cancer Maternal Grandfather   . Lung cancer Brother     smoker  . Scleroderma Sister   . Diabetes Sister   . Pancreatic cancer Neg Hx   . Rectal cancer Neg Hx   . Stomach cancer Neg Hx     ROS- All systems are reviewed and negative except as per the HPI above  Physical Exam: Filed Vitals:   01/23/15 1543 **Note De-Identified Schweers Obfuscation** BP: 138/88  Pulse: 65  Height: 5\' 4"  (1.626 m)  Weight: 180 lb (81.647 kg)    GEN- The patient is well appearing, alert and oriented x 3 today.   Head- normocephalic, atraumatic Eyes-  Sclera clear, conjunctiva pink Ears- hearing intact Oropharynx- clear Neck- supple, no JVP Lymph- no cervical lymphadenopathy Lungs- Clear to ausculation bilaterally, normal work of breathing Heart- Regular rate and rhythm, no murmurs, rubs or gallops, PMI not laterally displaced GI- soft, NT, ND, + BS Extremities- no clubbing, cyanosis, or edema MS- no significant deformity or atrophy Skin- no rash or lesion Psych- euthymic mood, full affect Neuro- strength and sensation are intact  EKG-Normal sinus rhythm, normal EKG. Epic records  reviewed  Assessment and Plan: 1. S/p aflutter ablation Maintaining SR Off xarelto for a chadsvasc score of 1(female)  F/u in afib clinic in 6 months.  Geroge Baseman Carroll, [facility] [address] [place], [place] [address] [phone]

## 2015-07-27 ENCOUNTER — Other Ambulatory Visit: Payer: Self-pay | Admitting: Neurosurgery

## 2015-07-27 DIAGNOSIS — M4802 Spinal stenosis, cervical region: Secondary | ICD-10-CM

## 2015-07-30 ENCOUNTER — Ambulatory Visit (HOSPITAL_COMMUNITY): Payer: BLUE CROSS/BLUE SHIELD | Admitting: Nurse Practitioner

## 2015-08-06 ENCOUNTER — Ambulatory Visit
Admission: RE | Admit: 2015-08-06 | Discharge: 2015-08-06 | Disposition: A | Discharge status: Home / Self Care | Payer: BLUE CROSS/BLUE SHIELD | Source: Ambulatory Visit | Attending: Neurosurgery | Admitting: Neurosurgery

## 2015-08-06 DIAGNOSIS — M4802 Spinal stenosis, cervical region: Secondary | ICD-10-CM

## 2015-09-18 ENCOUNTER — Inpatient Hospital Stay (HOSPITAL_COMMUNITY)
Admission: RE | Admit: 2015-09-18 | Payer: BLUE CROSS/BLUE SHIELD | Source: Ambulatory Visit | Admitting: Nurse Practitioner

## 2015-10-18 DIAGNOSIS — E559 Vitamin D deficiency, unspecified: Secondary | ICD-10-CM | POA: Diagnosis not present

## 2015-11-07 DIAGNOSIS — G5603 Carpal tunnel syndrome, bilateral upper limbs: Secondary | ICD-10-CM | POA: Diagnosis not present

## 2015-11-07 DIAGNOSIS — Z6831 Body mass index (BMI) 31.0-31.9, adult: Secondary | ICD-10-CM | POA: Diagnosis not present

## 2015-11-13 ENCOUNTER — Ambulatory Visit (INDEPENDENT_AMBULATORY_CARE_PROVIDER_SITE_OTHER): Payer: BLUE CROSS/BLUE SHIELD | Admitting: Family Medicine

## 2015-11-13 ENCOUNTER — Encounter: Payer: Self-pay | Admitting: Family Medicine

## 2015-11-13 VITALS — BP 129/81 | HR 72 | Temp 97.1°F | Ht 64.0 in | Wt 181.8 lb

## 2015-11-13 DIAGNOSIS — I10 Essential (primary) hypertension: Secondary | ICD-10-CM

## 2015-11-13 DIAGNOSIS — Z8639 Personal history of other endocrine, nutritional and metabolic disease: Secondary | ICD-10-CM | POA: Diagnosis not present

## 2015-11-13 DIAGNOSIS — B079 Viral wart, unspecified: Secondary | ICD-10-CM

## 2015-11-13 DIAGNOSIS — N649 Disorder of breast, unspecified: Secondary | ICD-10-CM

## 2015-11-13 DIAGNOSIS — E559 Vitamin D deficiency, unspecified: Secondary | ICD-10-CM | POA: Diagnosis not present

## 2015-11-13 HISTORY — DX: Personal history of other endocrine, nutritional and metabolic disease: Z86.39

## 2015-11-13 NOTE — Patient Instructions (Signed)
**Note De-Identified Hamill Obfuscation** Great to meet you!  You will be called to set up the mammogram  We will call with lab results within 1 week  Lets follow up in 6 weeks to see how you are doing with your wart

## 2015-11-13 NOTE — Progress Notes (Signed)
**Note De-Identified Joung Obfuscation** HPI  Patient presents today here to establish care.  Patient has history of hysterectomy, now on Premarin. Some anxiety after a loss in her family where she had to make the decision about withdrawal of care, treated well with sertraline  She feels well overall today.  She would like to discuss follow her right breast and to work on the left thumb.  Right breast lesion Has been present at least 5-6 months, she noticed initially a few spots of blood on her sleep sure, she attributed this to a dog that sleeps with her, today she noticed it and states that she can tell was coming from her breast, she's competent that it's been there at least 6 months. No skin changes or nipple retraction Has not had a mammogram in 7 years.   Wart Left thumb Frozen 5 months ago with no improvement Continues, does not seem any larger   PMH: Smoking status noted - never smoker Past medical history significant for diabetes resolved after bariatric surgery Family history positive for grandparents with colon cancer, throat cancer, lung cancer Surgical history negative for gastric bypass and atrial flutter ablation ROS: Per HPI  Objective: BP 129/81 mmHg  Pulse 72  Temp(Src) 97.1 F (36.2 C) (Oral)  Ht '5\' 4"'$  (1.626 m)  Wt 181 lb 12.8 oz (82.464 kg)  BMI 31.19 kg/m2 Gen: NAD, alert, cooperative with exam HEENT: NCAT, EOMI, PERRL CV: RRR, good S1/S2, no murmur Resp: CTABL, no wheezes, non-labored Abd: SNTND, BS present, no guarding or organomegaly Ext: No edema, warm Neuro: Alert and oriented, No gross deficits Skin Left thumb with approximately 5-7 mm in diameter verrucous papule  Breast, my CMA, Nigel Berthold was present for the entire exam Right breast with small, approximately 4-5 mm in diameter circular erythematous flat lesion at 6:00 approximately 3-4 cm below the nipple No concerning lumps or bumps, no nipple retraction, no skin changes   Assessment and plan:  # Breast  lesion Bleeding lesion present for more than 5-6 months, I am concerned about neoplasia Diagnostic mammogram Discussed with patient  # Wart on the thumb Cryotherapy today Recommended if this fails dermatology versus trial of duct tape and Compound W  # Avitaminosis D After bariatric surgery, taking 150,000 units weekly Rechecking levels  # Essential hypertension Controlled with only diet Labs    Orders Placed This Encounter  Procedures  . MM Digital Diagnostic Bilat    Standing Status: Future     Number of Occurrences:      Standing Expiration Date: 01/12/2017    Order Specific Question:  Reason for Exam (SYMPTOM  OR DIAGNOSIS REQUIRED)    Answer:  R bleeding breats lesion 5+ months    Order Specific Question:  Preferred imaging location?    Answer:  [facility]  . LDL Cholesterol, Direct  . CMP14+EGFR  . CBC with Differential  . VITAMIN D 25 Hydroxy (Vit-D Deficiency, Fractures)    Meds ordered this encounter  Medications  . Vitamin D, Ergocalciferol, (DRISDOL) 50000 units CAPS capsule    Sig: Take by mouth.  . sertraline (ZOLOFT) 100 MG tablet    Sig: Take 100 mg by mouth.  . cyclobenzaprine (FLEXERIL) 5 MG tablet    Sig: Take 5 mg by mouth 3 (three) times daily as needed for muscle spasms.  Marland Kitchen estrogens, conjugated, (PREMARIN) 1.25 MG tablet    Sig: Take 1.25 mg by mouth daily.  Marland Kitchen dicyclomine (BENTYL) 20 MG tablet    Sig: Take 20 mg by **Note De-Identified Osterberg Obfuscation** mouth every 6 (six) hours.  Marland Kitchen azelastine (ASTELIN) 0.1 % nasal spray    Sig: Place into both nostrils 2 (two) times daily. Use in each nostril as directed    Laroy Apple, MD [facility] 11/13/2015, 5:25 PM

## 2015-11-14 LAB — CMP14+EGFR
ALT: 16 IU/L (ref 0–32)
AST: 18 IU/L (ref 0–40)
Albumin/Globulin Ratio: 1.8 (ref 1.2–2.2)
Albumin: 3.9 g/dL (ref 3.6–4.8)
Alkaline Phosphatase: 96 IU/L (ref 39–117)
BUN/Creatinine Ratio: 28 (ref 12–28)
BUN: 19 mg/dL (ref 8–27)
Bilirubin Total: 0.3 mg/dL (ref 0.0–1.2)
CO2: 21 mmol/L (ref 18–29)
Calcium: 9.1 mg/dL (ref 8.7–10.3)
Chloride: 107 mmol/L — ABNORMAL HIGH (ref 96–106)
Creatinine, Ser: 0.68 mg/dL (ref 0.57–1.00)
GFR calc Af Amer: 108 mL/min/{1.73_m2} (ref >59)
GFR calc non Af Amer: 94 mL/min/{1.73_m2} (ref >59)
Globulin, Total: 2.2 g/dL (ref 1.5–4.5)
Glucose: 97 mg/dL (ref 65–99)
Potassium: 4.1 mmol/L (ref 3.5–5.2)
Sodium: 144 mmol/L (ref 134–144)
Total Protein: 6.1 g/dL (ref 6.0–8.5)

## 2015-11-14 LAB — CBC WITH DIFFERENTIAL/PLATELET
Basophils Absolute: 0 10*3/uL (ref 0.0–0.2)
Basos: 1 %
EOS (ABSOLUTE): 0.1 10*3/uL (ref 0.0–0.4)
Eos: 2 %
Hematocrit: 38.5 % (ref 34.0–46.6)
Hemoglobin: 12.6 g/dL (ref 11.1–15.9)
Immature Grans (Abs): 0 10*3/uL (ref 0.0–0.1)
Immature Granulocytes: 0 %
Lymphocytes Absolute: 1.4 10*3/uL (ref 0.7–3.1)
Lymphs: 25 %
MCH: 30.7 pg (ref 26.6–33.0)
MCHC: 32.7 g/dL (ref 31.5–35.7)
MCV: 94 fL (ref 79–97)
Monocytes Absolute: 0.3 10*3/uL (ref 0.1–0.9)
Monocytes: 5 %
Neutrophils Absolute: 3.9 10*3/uL (ref 1.4–7.0)
Neutrophils: 67 %
Platelets: 214 10*3/uL (ref 150–379)
RBC: 4.1 x10E6/uL (ref 3.77–5.28)
RDW: 13.9 % (ref 12.3–15.4)
WBC: 5.7 10*3/uL (ref 3.4–10.8)

## 2015-11-14 LAB — LDL CHOLESTEROL, DIRECT: LDL Direct: 60 mg/dL (ref 0–99)

## 2015-11-14 LAB — VITAMIN D 25 HYDROXY (VIT D DEFICIENCY, FRACTURES): Vit D, 25-Hydroxy: 32.4 ng/mL (ref 30.0–100.0)

## 2015-11-15 NOTE — Addendum Note (Signed)
**Note De-Identified Marchena Obfuscation** Addended by: Timmothy Euler on: 11/15/2015 08:20 AM   Modules accepted: Orders

## 2015-11-16 ENCOUNTER — Encounter: Payer: Self-pay | Admitting: *Deleted

## 2015-11-22 ENCOUNTER — Other Ambulatory Visit: Payer: Self-pay | Admitting: Family Medicine

## 2015-11-22 ENCOUNTER — Ambulatory Visit
Admission: RE | Admit: 2015-11-22 | Discharge: 2015-11-22 | Disposition: A | Discharge status: Home / Self Care | Payer: BLUE CROSS/BLUE SHIELD | Source: Ambulatory Visit | Attending: Family Medicine | Admitting: Family Medicine

## 2015-11-22 DIAGNOSIS — N6489 Other specified disorders of breast: Secondary | ICD-10-CM | POA: Diagnosis not present

## 2015-11-22 DIAGNOSIS — N6310 Unspecified lump in the right breast, unspecified quadrant: Secondary | ICD-10-CM

## 2015-11-22 DIAGNOSIS — N649 Disorder of breast, unspecified: Secondary | ICD-10-CM

## 2015-11-22 DIAGNOSIS — R922 Inconclusive mammogram: Secondary | ICD-10-CM | POA: Diagnosis not present

## 2015-11-23 ENCOUNTER — Other Ambulatory Visit: Payer: Self-pay | Admitting: Family Medicine

## 2015-11-23 DIAGNOSIS — N6310 Unspecified lump in the right breast, unspecified quadrant: Secondary | ICD-10-CM

## 2015-11-27 ENCOUNTER — Ambulatory Visit
Admission: RE | Admit: 2015-11-27 | Discharge: 2015-11-27 | Disposition: A | Discharge status: Home / Self Care | Payer: BLUE CROSS/BLUE SHIELD | Source: Ambulatory Visit | Attending: Family Medicine | Admitting: Family Medicine

## 2015-11-27 DIAGNOSIS — N6310 Unspecified lump in the right breast, unspecified quadrant: Secondary | ICD-10-CM

## 2015-11-27 DIAGNOSIS — N6452 Nipple discharge: Secondary | ICD-10-CM | POA: Diagnosis not present

## 2015-11-29 ENCOUNTER — Other Ambulatory Visit: Payer: Self-pay | Admitting: Family Medicine

## 2015-11-29 ENCOUNTER — Telehealth: Payer: Self-pay | Admitting: Family Medicine

## 2015-11-29 DIAGNOSIS — N649 Disorder of breast, unspecified: Secondary | ICD-10-CM | POA: Insufficient documentation

## 2015-11-29 DIAGNOSIS — E559 Vitamin D deficiency, unspecified: Secondary | ICD-10-CM | POA: Diagnosis not present

## 2015-11-29 DIAGNOSIS — G5603 Carpal tunnel syndrome, bilateral upper limbs: Secondary | ICD-10-CM | POA: Diagnosis not present

## 2015-11-29 DIAGNOSIS — Z6832 Body mass index (BMI) 32.0-32.9, adult: Secondary | ICD-10-CM | POA: Diagnosis not present

## 2015-11-29 DIAGNOSIS — E211 Secondary hyperparathyroidism, not elsewhere classified: Secondary | ICD-10-CM | POA: Diagnosis not present

## 2015-11-29 NOTE — Addendum Note (Signed)
**Note De-Identified Rossner Obfuscation** Addended by: Timmothy Euler on: 11/29/2015 05:18 PM   Modules accepted: Orders

## 2015-11-29 NOTE — Telephone Encounter (Signed)
**Note De-Identified Felicia Schultz** Ordering breast MRI per the radiologist recommendations that are copied below.   RECOMMENDATION: from Dr. Nolon Nations   1. Breast MRI is recommended for further characterization of right  nipple discharge.  2. If a lesion is detected on MRI, MR guided biopsy can be  performed.  3. If MRI is normal, surgical consultation is recommended.  I have discussed the findings and recommendations with the patient.  Results were also provided in writing at the conclusion of the  visit. If applicable, a reminder letter will be sent to the patient  regarding the next appointment.    BI-RADS CATEGORY 4: Suspicious.     Felicia Apple, MD [facility] 11/29/2015, 1:13 PM

## 2015-11-30 NOTE — Addendum Note (Signed)
**Note De-Identified Siegman Obfuscation** Addended by: Timmothy Euler on: 11/30/2015 02:41 PM   Modules accepted: Orders

## 2015-11-30 NOTE — Telephone Encounter (Signed)
**Note De-Identified Carlisi Obfuscation** Several MRI breast modifications. MRI with and without bilaterally. Recommended by radiology following mammo with suspicious findings  Laroy Apple, MD [facility] 11/30/2015, 2:41 PM

## 2015-12-12 ENCOUNTER — Ambulatory Visit
Admission: RE | Admit: 2015-12-12 | Discharge: 2015-12-12 | Disposition: A | Discharge status: Home / Self Care | Payer: BLUE CROSS/BLUE SHIELD | Source: Ambulatory Visit | Attending: Family Medicine | Admitting: Family Medicine

## 2015-12-12 DIAGNOSIS — N63 Unspecified lump in breast: Secondary | ICD-10-CM | POA: Diagnosis not present

## 2015-12-12 DIAGNOSIS — N649 Disorder of breast, unspecified: Secondary | ICD-10-CM

## 2015-12-12 MED ORDER — GADOBENATE DIMEGLUMINE 529 MG/ML IV SOLN
17.0000 mL | Freq: Once | INTRAVENOUS | Status: AC | PRN
  Administered 2015-12-12: 17 mL via INTRAVENOUS

## 2015-12-13 ENCOUNTER — Telehealth: Payer: Self-pay | Admitting: Family Medicine

## 2015-12-13 NOTE — Telephone Encounter (Signed)
**Note De-Identified Eckersley Obfuscation** Called to discuss MRI of the breasts findings, I am not sure if the radiologist has reviewed this findings with her. They have recommended biopsy which I will discuss with her.  Left voicemail, will try again.

## 2015-12-14 ENCOUNTER — Other Ambulatory Visit: Payer: Self-pay | Admitting: Family Medicine

## 2015-12-14 ENCOUNTER — Telehealth: Payer: Self-pay | Admitting: Family Medicine

## 2015-12-14 DIAGNOSIS — N649 Disorder of breast, unspecified: Secondary | ICD-10-CM

## 2015-12-14 NOTE — Telephone Encounter (Signed)
**Note De-Identified Reineck Obfuscation** Discussed with patient in [place], she was here with her husband.   Reviewed MRI breast, recommend Breast Bx  Order is placed.   Laroy Apple, MD [facility] 12/14/2015, 5:15 PM  '

## 2015-12-25 ENCOUNTER — Ambulatory Visit: Payer: BLUE CROSS/BLUE SHIELD | Admitting: Family Medicine

## 2015-12-31 ENCOUNTER — Other Ambulatory Visit: Payer: Self-pay | Admitting: Family Medicine

## 2015-12-31 DIAGNOSIS — N6452 Nipple discharge: Secondary | ICD-10-CM | POA: Diagnosis not present

## 2015-12-31 DIAGNOSIS — Z8679 Personal history of other diseases of the circulatory system: Secondary | ICD-10-CM | POA: Diagnosis not present

## 2015-12-31 DIAGNOSIS — N63 Unspecified lump in breast: Secondary | ICD-10-CM | POA: Diagnosis not present

## 2015-12-31 DIAGNOSIS — F329 Major depressive disorder, single episode, unspecified: Secondary | ICD-10-CM | POA: Diagnosis not present

## 2015-12-31 DIAGNOSIS — N649 Disorder of breast, unspecified: Secondary | ICD-10-CM

## 2016-01-04 ENCOUNTER — Other Ambulatory Visit: Payer: Self-pay | Admitting: Family Medicine

## 2016-01-04 ENCOUNTER — Ambulatory Visit
Admission: RE | Admit: 2016-01-04 | Discharge: 2016-01-04 | Disposition: A | Discharge status: Home / Self Care | Payer: BLUE CROSS/BLUE SHIELD | Source: Ambulatory Visit | Attending: Family Medicine | Admitting: Family Medicine

## 2016-01-04 DIAGNOSIS — N649 Disorder of breast, unspecified: Secondary | ICD-10-CM

## 2016-01-04 DIAGNOSIS — N6011 Diffuse cystic mastopathy of right breast: Secondary | ICD-10-CM | POA: Diagnosis not present

## 2016-01-04 DIAGNOSIS — N63 Unspecified lump in breast: Secondary | ICD-10-CM | POA: Diagnosis not present

## 2016-01-04 MED ORDER — GADOBENATE DIMEGLUMINE 529 MG/ML IV SOLN
17.0000 mL | Freq: Once | INTRAVENOUS | Status: AC | PRN
  Administered 2016-01-04: 17 mL via INTRAVENOUS

## 2016-01-09 DIAGNOSIS — G5601 Carpal tunnel syndrome, right upper limb: Secondary | ICD-10-CM | POA: Diagnosis not present

## 2016-01-10 ENCOUNTER — Other Ambulatory Visit: Payer: Self-pay | Admitting: General Surgery

## 2016-01-10 DIAGNOSIS — Z8679 Personal history of other diseases of the circulatory system: Secondary | ICD-10-CM | POA: Diagnosis not present

## 2016-01-10 DIAGNOSIS — N6452 Nipple discharge: Secondary | ICD-10-CM | POA: Diagnosis not present

## 2016-01-10 DIAGNOSIS — N63 Unspecified lump in breast: Secondary | ICD-10-CM | POA: Diagnosis not present

## 2016-01-10 DIAGNOSIS — F329 Major depressive disorder, single episode, unspecified: Secondary | ICD-10-CM | POA: Diagnosis not present

## 2016-01-15 HISTORY — PX: BREAST EXCISIONAL BIOPSY: SUR124

## 2016-01-28 ENCOUNTER — Encounter (HOSPITAL_BASED_OUTPATIENT_CLINIC_OR_DEPARTMENT_OTHER): Admission: RE | Payer: Self-pay | Source: Ambulatory Visit

## 2016-01-28 ENCOUNTER — Other Ambulatory Visit: Payer: Self-pay | Admitting: General Surgery

## 2016-01-28 ENCOUNTER — Ambulatory Visit (HOSPITAL_BASED_OUTPATIENT_CLINIC_OR_DEPARTMENT_OTHER)
Admission: RE | Admit: 2016-01-28 | Payer: BLUE CROSS/BLUE SHIELD | Source: Ambulatory Visit | Admitting: General Surgery

## 2016-01-28 DIAGNOSIS — N6011 Diffuse cystic mastopathy of right breast: Secondary | ICD-10-CM | POA: Diagnosis not present

## 2016-01-28 DIAGNOSIS — D241 Benign neoplasm of right breast: Secondary | ICD-10-CM | POA: Diagnosis not present

## 2016-01-28 SURGERY — EXCISION DUCTAL SYSTEM BREAST
Anesthesia: General | Site: Breast | Laterality: Right

## 2016-01-30 NOTE — Progress Notes (Signed)
**Note De-Identified Bahe Obfuscation** Inform patient of Pathology report,.  Final shows benign papilloma, NO CANCER.  Please call her today..Thanks    hmi

## 2016-02-05 ENCOUNTER — Telehealth: Payer: Self-pay | Admitting: Family Medicine

## 2016-03-21 DIAGNOSIS — M67431 Ganglion, right wrist: Secondary | ICD-10-CM | POA: Diagnosis not present

## 2016-04-01 DIAGNOSIS — Z6831 Body mass index (BMI) 31.0-31.9, adult: Secondary | ICD-10-CM | POA: Diagnosis not present

## 2016-04-01 DIAGNOSIS — M542 Cervicalgia: Secondary | ICD-10-CM | POA: Diagnosis not present

## 2016-04-01 DIAGNOSIS — M4802 Spinal stenosis, cervical region: Secondary | ICD-10-CM | POA: Diagnosis not present

## 2016-04-01 DIAGNOSIS — M67431 Ganglion, right wrist: Secondary | ICD-10-CM | POA: Diagnosis not present

## 2016-04-10 DIAGNOSIS — M67431 Ganglion, right wrist: Secondary | ICD-10-CM | POA: Diagnosis not present

## 2016-05-12 ENCOUNTER — Other Ambulatory Visit: Payer: Self-pay

## 2016-05-12 DIAGNOSIS — M67431 Ganglion, right wrist: Secondary | ICD-10-CM | POA: Diagnosis not present

## 2016-05-26 DIAGNOSIS — G5602 Carpal tunnel syndrome, left upper limb: Secondary | ICD-10-CM | POA: Diagnosis not present

## 2016-05-26 DIAGNOSIS — G5603 Carpal tunnel syndrome, bilateral upper limbs: Secondary | ICD-10-CM | POA: Diagnosis not present

## 2016-06-02 ENCOUNTER — Encounter: Payer: Self-pay | Admitting: Family Medicine

## 2016-06-02 ENCOUNTER — Ambulatory Visit (INDEPENDENT_AMBULATORY_CARE_PROVIDER_SITE_OTHER): Payer: BLUE CROSS/BLUE SHIELD | Admitting: Family Medicine

## 2016-06-02 VITALS — BP 151/89 | HR 72 | Temp 97.4°F | Ht 64.0 in | Wt 182.2 lb

## 2016-06-02 DIAGNOSIS — F419 Anxiety disorder, unspecified: Secondary | ICD-10-CM

## 2016-06-02 DIAGNOSIS — E559 Vitamin D deficiency, unspecified: Secondary | ICD-10-CM

## 2016-06-02 DIAGNOSIS — Z0000 Encounter for general adult medical examination without abnormal findings: Secondary | ICD-10-CM | POA: Diagnosis not present

## 2016-06-02 DIAGNOSIS — R739 Hyperglycemia, unspecified: Secondary | ICD-10-CM | POA: Diagnosis not present

## 2016-06-02 DIAGNOSIS — N959 Unspecified menopausal and perimenopausal disorder: Secondary | ICD-10-CM

## 2016-06-02 MED ORDER — DICYCLOMINE HCL 20 MG PO TABS: 20.0000 mg | ORAL_TABLET | Freq: Four times a day (QID) | ORAL | 5 refills | Status: DC

## 2016-06-02 MED ORDER — CYCLOBENZAPRINE HCL 5 MG PO TABS: 5.0000 mg | ORAL_TABLET | Freq: Three times a day (TID) | ORAL | 5 refills | Status: DC | PRN

## 2016-06-02 MED ORDER — SERTRALINE HCL 100 MG PO TABS: 100.0000 mg | ORAL_TABLET | Freq: Every day | ORAL | 3 refills | Status: DC

## 2016-06-02 MED ORDER — ESTROGENS CONJUGATED 0.625 MG PO TABS: 0.6250 mg | ORAL_TABLET | Freq: Every day | ORAL | 5 refills | Status: DC

## 2016-06-02 MED ORDER — AZELASTINE HCL 0.1 % NA SOLN: 1.0000 | Freq: Two times a day (BID) | NASAL | 11 refills | Status: DC

## 2016-06-02 MED ORDER — VITAMIN D (ERGOCALCIFEROL) 1.25 MG (50000 UNIT) PO CAPS: 50000.0000 [IU] | ORAL_CAPSULE | ORAL | 11 refills | Status: DC

## 2016-06-02 NOTE — Progress Notes (Signed)
**Note De-identified Franta Obfuscation**   **Note De-Identified Niemann Obfuscation** HPI  Patient presents today for an annual physical exam.  She has no complaints except for multiple recent surgeries.  She had her bloody nipple discharge investigated thoroughly and has now had a biopsy, there is no cancer found. She also had a recent carpal tunnel release and then a ganglion cyst removal. She feels well and needs refills of medications.  She is fasting today.  She has history of bilateral knee issues and cervical disc disease which requires handicap placard, she works at [employer] and has to walk a very long distance with her computer to her office.  PMH: Smoking status noted ROS: Per HPI  Objective: BP (!) 151/89   Pulse 72   Temp 97.4 F (36.3 C) (Oral)   Ht _0  (1.626 m)   Wt 182 lb 3.2 oz (82.6 kg)   BMI 31.27 kg/m  Gen: NAD, alert, cooperative with exam HEENT: NCAT, oropharynx clear, nares clear Neck: No thyromegaly CV: RRR, good S1/S2, no murmur Resp: CTABL, no wheezes, non-labored Abd: SNTND, BS present, no guarding or organomegaly Ext: No edema, warm Neuro: Alert and oriented, strength 5/5 and sensation intact in bilateral lower extremities, 2+ patellar tendon reflexes bilaterally  Assessment and plan:  # Annual physical exam Normal exam Discussed age-appropriate screening, patient does not need a Pap smear as she is status post total hysterectomy Up to date colonoscopy  # Anxiety Doing well with Zoloft, refilled  # Avitaminosis D S/p Gastric bypass Rechecking vitamin D level, replacing with very high-dose 50,000 units 3 times weekly  # Postmenopausal symptoms Titrating down Premarin, doing well so far Cut back from 1.25 mg a 0.6 mg, then she's cut back to 3 or 4 times a week Decrease dose prescribed to 0.625    Orders Placed This Encounter  Procedures  . VITAMIN D 25 Hydroxy (Vit-D Deficiency, Fractures)  . Lipid panel  . CBC with Differential/Platelet  . CMP14+EGFR  . TSH    Meds ordered this encounter    Medications  . cyclobenzaprine (FLEXERIL) 5 MG tablet    Sig: Take 1 tablet (5 mg total) by mouth 3 (three) times daily as needed for muscle spasms.    Dispense:  60 tablet    Refill:  5  . azelastine (ASTELIN) 0.1 % nasal spray    Sig: Place 1-2 sprays into both nostrils 2 (two) times daily. Use in each nostril as directed    Dispense:  30 mL    Refill:  11  . estrogens, conjugated, (PREMARIN) 0.625 MG tablet    Sig: Take 1 tablet (0.625 mg total) by mouth daily.    Dispense:  30 tablet    Refill:  5  . dicyclomine (BENTYL) 20 MG tablet    Sig: Take 1 tablet (20 mg total) by mouth every 6 (six) hours.    Dispense:  120 tablet    Refill:  5  . sertraline (ZOLOFT) 100 MG tablet    Sig: Take 1 tablet (100 mg total) by mouth daily.    Dispense:  90 tablet    Refill:  3  . Vitamin D, Ergocalciferol, (DRISDOL) 50000 units CAPS capsule    Sig: Take 1 capsule (50,000 Units total) by mouth 3 (three) times a week.    Dispense:  12 capsule    Refill:  [place], MD [facility] 06/02/2016, 12:01 PM

## 2016-06-02 NOTE — Patient Instructions (Signed)
**Note De-Identified Gulas Obfuscation** Great to see you!  Lets follow up in about 4 months unless you need [place] sooner  I reduced the dose of premarin to 0.625 mg daily, decrease this to 1/2 pill if possible.

## 2016-06-03 LAB — CMP14+EGFR
ALT: 12 IU/L (ref 0–32)
AST: 15 IU/L (ref 0–40)
Albumin/Globulin Ratio: 2.2 (ref 1.2–2.2)
Albumin: 4.3 g/dL (ref 3.6–4.8)
Alkaline Phosphatase: 110 IU/L (ref 39–117)
BUN/Creatinine Ratio: 32 — ABNORMAL HIGH (ref 12–28)
BUN: 21 mg/dL (ref 8–27)
Bilirubin Total: 0.5 mg/dL (ref 0.0–1.2)
CO2: 23 mmol/L (ref 18–29)
Calcium: 8.9 mg/dL (ref 8.7–10.3)
Chloride: 105 mmol/L (ref 96–106)
Creatinine, Ser: 0.65 mg/dL (ref 0.57–1.00)
GFR calc Af Amer: 109 mL/min/{1.73_m2} (ref >59)
GFR calc non Af Amer: 95 mL/min/{1.73_m2} (ref >59)
Globulin, Total: 2 g/dL (ref 1.5–4.5)
Glucose: 102 mg/dL — ABNORMAL HIGH (ref 65–99)
Potassium: 3.7 mmol/L (ref 3.5–5.2)
Sodium: 143 mmol/L (ref 134–144)
Total Protein: 6.3 g/dL (ref 6.0–8.5)

## 2016-06-03 LAB — CBC WITH DIFFERENTIAL/PLATELET
Basophils Absolute: 0 10*3/uL (ref 0.0–0.2)
Basos: 0 %
EOS (ABSOLUTE): 0.1 10*3/uL (ref 0.0–0.4)
Eos: 2 %
Hematocrit: 38.2 % (ref 34.0–46.6)
Hemoglobin: 12.4 g/dL (ref 11.1–15.9)
Immature Grans (Abs): 0 10*3/uL (ref 0.0–0.1)
Immature Granulocytes: 0 %
Lymphocytes Absolute: 1.2 10*3/uL (ref 0.7–3.1)
Lymphs: 26 %
MCH: 29.5 pg (ref 26.6–33.0)
MCHC: 32.5 g/dL (ref 31.5–35.7)
MCV: 91 fL (ref 79–97)
Monocytes Absolute: 0.3 10*3/uL (ref 0.1–0.9)
Monocytes: 7 %
Neutrophils Absolute: 3.1 10*3/uL (ref 1.4–7.0)
Neutrophils: 65 %
Platelets: 212 10*3/uL (ref 150–379)
RBC: 4.2 x10E6/uL (ref 3.77–5.28)
RDW: 14.3 % (ref 12.3–15.4)
WBC: 4.7 10*3/uL (ref 3.4–10.8)

## 2016-06-03 LAB — VITAMIN D 25 HYDROXY (VIT D DEFICIENCY, FRACTURES): Vit D, 25-Hydroxy: 36.1 ng/mL (ref 30.0–100.0)

## 2016-06-03 LAB — LIPID PANEL
Chol/HDL Ratio: 2 ratio units (ref 0.0–4.4)
Cholesterol, Total: 166 mg/dL (ref 100–199)
HDL: 83 mg/dL (ref >39)
LDL Calculated: 72 mg/dL (ref 0–99)
Triglycerides: 53 mg/dL (ref 0–149)
VLDL Cholesterol Cal: 11 mg/dL (ref 5–40)

## 2016-06-03 LAB — TSH: TSH: 1.9 u[IU]/mL (ref 0.450–4.500)

## 2016-06-05 LAB — SPECIMEN STATUS REPORT

## 2016-06-05 LAB — HGB A1C W/O EAG: Hgb A1c MFr Bld: 5.5 % (ref 4.8–5.6)

## 2016-06-13 ENCOUNTER — Ambulatory Visit (HOSPITAL_COMMUNITY)
Admission: RE | Admit: 2016-06-13 | Discharge: 2016-06-13 | Disposition: A | Discharge status: Home / Self Care | Payer: BLUE CROSS/BLUE SHIELD | Source: Ambulatory Visit | Attending: Nurse Practitioner | Admitting: Nurse Practitioner

## 2016-06-13 ENCOUNTER — Encounter (HOSPITAL_COMMUNITY): Payer: Self-pay | Admitting: Nurse Practitioner

## 2016-06-13 VITALS — BP 164/90 | HR 71 | Ht 64.0 in | Wt 178.8 lb

## 2016-06-13 DIAGNOSIS — Z888 Allergy status to other drugs, medicaments and biological substances status: Secondary | ICD-10-CM | POA: Insufficient documentation

## 2016-06-13 DIAGNOSIS — Z86010 Personal history of colonic polyps: Secondary | ICD-10-CM | POA: Diagnosis not present

## 2016-06-13 DIAGNOSIS — Z8349 Family history of other endocrine, nutritional and metabolic diseases: Secondary | ICD-10-CM | POA: Insufficient documentation

## 2016-06-13 DIAGNOSIS — Z90710 Acquired absence of both cervix and uterus: Secondary | ICD-10-CM | POA: Diagnosis not present

## 2016-06-13 DIAGNOSIS — K219 Gastro-esophageal reflux disease without esophagitis: Secondary | ICD-10-CM | POA: Diagnosis not present

## 2016-06-13 DIAGNOSIS — Z9884 Bariatric surgery status: Secondary | ICD-10-CM | POA: Diagnosis not present

## 2016-06-13 DIAGNOSIS — Z880 Allergy status to penicillin: Secondary | ICD-10-CM | POA: Insufficient documentation

## 2016-06-13 DIAGNOSIS — I483 Typical atrial flutter: Secondary | ICD-10-CM

## 2016-06-13 DIAGNOSIS — Z79890 Hormone replacement therapy (postmenopausal): Secondary | ICD-10-CM | POA: Diagnosis not present

## 2016-06-13 DIAGNOSIS — Z801 Family history of malignant neoplasm of trachea, bronchus and lung: Secondary | ICD-10-CM | POA: Insufficient documentation

## 2016-06-13 DIAGNOSIS — Z825 Family history of asthma and other chronic lower respiratory diseases: Secondary | ICD-10-CM | POA: Insufficient documentation

## 2016-06-13 DIAGNOSIS — Z8261 Family history of arthritis: Secondary | ICD-10-CM | POA: Diagnosis not present

## 2016-06-13 DIAGNOSIS — I491 Atrial premature depolarization: Secondary | ICD-10-CM | POA: Insufficient documentation

## 2016-06-13 DIAGNOSIS — Z833 Family history of diabetes mellitus: Secondary | ICD-10-CM | POA: Insufficient documentation

## 2016-06-13 DIAGNOSIS — Z79899 Other long term (current) drug therapy: Secondary | ICD-10-CM | POA: Insufficient documentation

## 2016-06-13 DIAGNOSIS — M810 Age-related osteoporosis without current pathological fracture: Secondary | ICD-10-CM | POA: Diagnosis not present

## 2016-06-13 DIAGNOSIS — Z9889 Other specified postprocedural states: Secondary | ICD-10-CM | POA: Insufficient documentation

## 2016-06-13 DIAGNOSIS — I10 Essential (primary) hypertension: Secondary | ICD-10-CM | POA: Diagnosis not present

## 2016-06-13 DIAGNOSIS — I4892 Unspecified atrial flutter: Secondary | ICD-10-CM | POA: Insufficient documentation

## 2016-06-13 DIAGNOSIS — I4891 Unspecified atrial fibrillation: Secondary | ICD-10-CM | POA: Diagnosis not present

## 2016-06-13 DIAGNOSIS — Z823 Family history of stroke: Secondary | ICD-10-CM | POA: Insufficient documentation

## 2016-06-13 DIAGNOSIS — Z85828 Personal history of other malignant neoplasm of skin: Secondary | ICD-10-CM | POA: Insufficient documentation

## 2016-06-13 DIAGNOSIS — Z882 Allergy status to sulfonamides status: Secondary | ICD-10-CM | POA: Diagnosis not present

## 2016-06-13 DIAGNOSIS — Z841 Family history of disorders of kidney and ureter: Secondary | ICD-10-CM | POA: Diagnosis not present

## 2016-06-13 DIAGNOSIS — Z8269 Family history of other diseases of the musculoskeletal system and connective tissue: Secondary | ICD-10-CM | POA: Insufficient documentation

## 2016-06-13 DIAGNOSIS — Z881 Allergy status to other antibiotic agents status: Secondary | ICD-10-CM | POA: Insufficient documentation

## 2016-06-13 DIAGNOSIS — Z800 Family history of malignant neoplasm of digestive organs: Secondary | ICD-10-CM | POA: Diagnosis not present

## 2016-06-13 NOTE — Progress Notes (Signed)
**Note De-Identified Huyser Obfuscation** Patient ID: Felicia Schultz, female   DOB: May 22, 1951, 65 y.o.   MRN: [id number]     Primary Care Physician: Kenn File, MD Referring Physician: Dr. Marina Goodell [name] is a 65 y.o. female with a h/o atrial flutter ablation March 2015. She was seen in f/u with Dr. Rayann [name] ieveral times and has been  doing well. Xarelto and atenolol were stopped for a chadsvasc score of 1. She in the afib clinic 06/13/16 and  still reports abstinence of irregular heart beat. BP mildly elevated and is encouraged to f/u at home with readings as she reports it is usually high in the doctor's office.  Today, she denies symptoms of palpitations, chest pain, shortness of breath, orthopnea, PND, lower extremity edema, dizziness, presyncope, syncope, or neurologic sequela. The patient is tolerating medications without difficulties and is otherwise without complaint today.   Past Medical History:  Diagnosis Date  . Allergy   . Atrial flutter ([place])    typical appearing  . Cancer ([place])    basal ca - nose - 1985  . Diabetes mellitus    type 2-no longer since gastric in 2005  . GERD (gastroesophageal reflux disease)   . Hypertension   . Hypokalemia   . Osteoporosis 2000  . Paroxysmal atrial fibrillation ([facility])   . Personal history of colonic polyps - sessile serrated 11/23/2013   Past Surgical History:  Procedure Laterality Date  . APPENDECTOMY    . ATRIAL FLUTTER ABLATION N/A 09/05/2014   Procedure: ATRIAL FLUTTER ABLATION;  Surgeon: Thompson Grayer, MD;  Location: [facility] CATH LAB;  Service: Cardiovascular;  Laterality: N/A;  . CARPAL TUNNEL RELEASE Bilateral   . FOOT SURGERY Bilateral 2012   shorten bones in both feet  . GASTRIC BYPASS    . removal of ovary Right 1987  . SPINE SURGERY  2000/2012  . TOTAL ABDOMINAL HYSTERECTOMY  1999    Current Outpatient Prescriptions  Medication Sig Dispense Refill  . azelastine (ASTELIN) 0.1 % nasal spray Place 1-2 sprays into both nostrils 2 (two) times daily. Use in each  nostril as directed 30 mL 11  . cyclobenzaprine (FLEXERIL) 5 MG tablet Take 1 tablet (5 mg total) by mouth 3 (three) times daily as needed for muscle spasms. 60 tablet 5  . dicyclomine (BENTYL) 20 MG tablet Take 1 tablet (20 mg total) by mouth every 6 (six) hours. 120 tablet 5  . estrogens, conjugated, (PREMARIN) 0.625 MG tablet Take 1 tablet (0.625 mg total) by mouth daily. 30 tablet 5  . sertraline (ZOLOFT) 100 MG tablet Take 1 tablet (100 mg total) by mouth daily. 90 tablet 3  . Vitamin D, Ergocalciferol, (DRISDOL) 50000 units CAPS capsule Take 1 capsule (50,000 Units total) by mouth 3 (three) times a week. 12 capsule 11   No current facility-administered medications for this encounter.     Allergies  Allergen Reactions  . Augmentin [Amoxicillin-Pot Clavulanate] Nausea Only    Severe nausea. Couldn't keep medication down   . Clarithromycin Hives  . Nitrofurantoin Other (See Comments) and Rash    Unknown Palms and hand itching, turned bright red per pt  . Sulfonamide Derivatives Other (See Comments)    Abdominal cramping, nausea  . Nitrofurantoin Macrocrystal Swelling  . Sulfa Antibiotics Nausea And Vomiting    Social History   Social History  . Marital status: Married    Spouse name: N/A  . Number of children: 0  . 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 Smoking status: Never Smoker  . Smokeless tobacco: Never Used  . Alcohol use No  . Drug use: No  . Sexual activity: No   Other Topics Concern  . Not on file   Social History Narrative   Married, no children   Engineer, drilling for [employer].   Lives in [place].   1-2 caffeine/day    Family History  Problem Relation Age of Onset  . Diabetes Mother   . Heart disease Mother   . Arthritis Mother   . Hearing loss Mother   . Hyperlipidemia Mother   . Hypertension Mother   . Kidney disease Mother   . Learning  disabilities Mother   . Miscarriages / [place] Mother   . Heart disease Father   . Hypertension Father   . Alcohol abuse Father   . Hyperlipidemia Father   . Emphysema Maternal Grandmother   . Cancer Maternal Grandmother     throat and lung(smoker)  . Heart disease Brother   . Cancer Brother     lung  . Colon cancer Maternal Grandfather   . Cancer Maternal Grandfather     colon  . Scleroderma Sister   . Early death Sister     schlederma  . Miscarriages / Stillbirths Sister   . Stroke Brother   . Emphysema Sister   . Diabetes Sister   . Hyperlipidemia Sister   . Hypertension Sister   . Mental illness Sister   . Miscarriages / Stillbirths Sister   . Stroke Sister   . Lung cancer Brother     smoker  . Diabetes Brother   . Diabetes Sister   . Pancreatic cancer Neg Hx   . Rectal cancer Neg Hx   . Stomach cancer Neg Hx     ROS- All systems are reviewed and negative except as per the HPI above  Physical Exam: Vitals:   06/13/16 1032  BP: (!) 164/90  Pulse: 71  Weight: 178 lb 12.8 oz (81.1 kg)  Height: 5\' 4"  (1.626 m)    GEN- The patient is well appearing, alert and oriented x 3 today.   Head- normocephalic, atraumatic Eyes-  Sclera clear, conjunctiva pink Ears- hearing intact Oropharynx- clear Neck- supple, no JVP Lymph- no cervical lymphadenopathy Lungs- Clear to ausculation bilaterally, normal work of breathing Heart- Regular rate and rhythm, no murmurs, rubs or gallops, PMI not laterally displaced GI- soft, NT, ND, + BS Extremities- no clubbing, cyanosis, or edema MS- no significant deformity or atrophy Skin- no rash or lesion Psych- euthymic mood, full affect Neuro- strength and sensation are intact  EKG-Normal sinus rhythm, normal EKG. Epic records reviewed  Assessment and Plan: 1. S/p aflutter ablation April 2017 Maintaining SR Off xarelto for a chadsvasc score of 1(female)  2. Elevated BP Encouraged to watch at home and if is elevated above  140 mg sys for majority of reading, f/u with PCP Limit salt  afib clinic as needed  Butch Penny C. Travarius Lange, [facility] [address] [place], [place] [address] [phone]

## 2016-06-16 HISTORY — PX: WRIST SURGERY: SHX841

## 2016-06-27 DIAGNOSIS — M67431 Ganglion, right wrist: Secondary | ICD-10-CM | POA: Diagnosis not present

## 2016-06-30 ENCOUNTER — Encounter (HOSPITAL_COMMUNITY): Payer: Self-pay | Admitting: Nurse Practitioner

## 2016-08-13 ENCOUNTER — Ambulatory Visit (INDEPENDENT_AMBULATORY_CARE_PROVIDER_SITE_OTHER): Payer: BLUE CROSS/BLUE SHIELD | Admitting: Family Medicine

## 2016-08-13 ENCOUNTER — Encounter: Payer: Self-pay | Admitting: Family Medicine

## 2016-08-13 VITALS — BP 118/70 | HR 76 | Temp 98.7°F | Ht 64.0 in | Wt 181.0 lb

## 2016-08-13 DIAGNOSIS — R059 Cough, unspecified: Secondary | ICD-10-CM

## 2016-08-13 DIAGNOSIS — R05 Cough: Secondary | ICD-10-CM | POA: Diagnosis not present

## 2016-08-13 DIAGNOSIS — R490 Dysphonia: Secondary | ICD-10-CM | POA: Diagnosis not present

## 2016-08-13 MED ORDER — GUAIFENESIN-CODEINE 100-10 MG/5ML PO SOLN: 5.0000 mL | Freq: Three times a day (TID) | ORAL | 0 refills | Status: DC | PRN

## 2016-08-13 MED ORDER — CEFDINIR 300 MG PO CAPS: 300.0000 mg | ORAL_CAPSULE | Freq: Two times a day (BID) | ORAL | 0 refills | Status: DC

## 2016-08-13 MED ORDER — METHYLPREDNISOLONE ACETATE 80 MG/ML IJ SUSP
80.0000 mg | Freq: Once | INTRAMUSCULAR | Status: AC
  Administered 2016-08-13: 80 mg via INTRAMUSCULAR

## 2016-08-13 NOTE — Progress Notes (Signed)
**Note De-identified Mount Obfuscation**   **Note De-Identified Fors Obfuscation** HPI  Patient presents today here with cough and hoarseness.  Patient has had cough, hoarseness, and throat swelling feeling like she is having a hard time breathing and panicking in the night for about 2 days.  She's tolerating food and fluids normally. She does not have any fevers, body aches, or malaise.  Patient has several antibiotic intolerances.  PMH: Smoking status noted ROS: Per HPI  Objective: BP 118/70 (BP Location: Left Arm)   Pulse 76   Temp 98.7 F (37.1 C) (Oral)   Ht 5\' 4"  (1.626 m)   Wt 181 lb (82.1 kg)   BMI 31.07 kg/m  Gen: NAD, alert, cooperative with exam HEENT: NCAT, oropharynx moist and slightly erythematous, nares clear, TMs normal bilaterally, hoarse CV: RRR, good S1/S2, no murmur Resp: CTABL, no wheezes, non-labored Ext: No edema, warm Neuro: Alert and oriented, No gross deficits  Assessment and plan:  # Cough, hoarseness Patient with subjective throat swelling making it difficult to breathe, as well as hoarseness. Cough and dyspnea cause at least some concern for underlying or developing pneumonia, went ahead and cover Omnicef and her constellation of symptoms  Supportive care discussed, usual course of illness Return to clinic with any concerns.   with codeine for nighttime cough.  Meds ordered this encounter  Medications  . cefdinir (OMNICEF) 300 MG capsule    Sig: Take 1 capsule (300 mg total) by mouth 2 (two) times daily. 1 po BID    Dispense:  20 capsule    Refill:  [place], MD [facility] 08/13/2016, 5:37 PM

## 2016-08-13 NOTE — Patient Instructions (Signed)
**Note De-Identified Aronoff Obfuscation** Great to see you!  Start omnicef tonight  Try to rest your voce as much as possible   Acute Bronchitis, Adult Acute bronchitis is when air tubes (bronchi) in the lungs suddenly get swollen. The condition can make it hard to breathe. It can also cause these symptoms:  A cough.  Coughing up clear, yellow, or green mucus.  Wheezing.  Chest congestion.  Shortness of breath.  A fever.  Body aches.  Chills.  A sore throat. Follow these instructions at home: Medicines   Take over-the-counter and prescription medicines only as told by your doctor.  If you were prescribed an antibiotic medicine, take it as told by your doctor. Do not stop taking the antibiotic even if you start to feel better. General instructions   Rest.  Drink enough fluids to keep your pee (urine) clear or pale yellow.  Avoid smoking and secondhand smoke. If you smoke and you need help quitting, ask your doctor. Quitting will help your lungs heal faster.  Use an inhaler, cool mist vaporizer, or humidifier as told by your doctor.  Keep all follow-up visits as told by your doctor. This is important. How is this prevented? To lower your risk of getting this condition again:  Wash your hands often with soap and water. If you cannot use soap and water, use hand sanitizer.  Avoid contact with people who have cold symptoms.  Try not to touch your hands to your mouth, nose, or eyes.  Make sure to get the flu shot every year. Contact a doctor if:  Your symptoms do not get better in 2 weeks. Get help right away if:  You cough up blood.  You have chest pain.  You have very bad shortness of breath.  You become dehydrated.  You faint (pass out) or keep feeling like you are going to pass out.  You keep throwing up (vomiting).  You have a very bad headache.  Your fever or chills gets worse. This information is not intended to replace advice given to you by your health care provider. Make sure you  discuss any questions you have with your health care provider. Document Released: 11/19/2007 Document Revised: 01/09/2016 Document Reviewed: 11/21/2015 Elsevier Interactive Patient Education  2017 [employer].

## 2016-08-22 ENCOUNTER — Telehealth: Payer: Self-pay

## 2016-08-22 ENCOUNTER — Other Ambulatory Visit: Payer: Self-pay

## 2016-08-22 ENCOUNTER — Telehealth: Payer: Self-pay | Admitting: Family Medicine

## 2016-08-22 MED ORDER — LEVOFLOXACIN 500 MG PO TABS: 500.0000 mg | ORAL_TABLET | Freq: Every day | ORAL | 0 refills | Status: DC

## 2016-08-22 NOTE — Telephone Encounter (Signed)
**Note De-identified Mcneill Obfuscation** error 

## 2016-08-22 NOTE — Telephone Encounter (Signed)
**Note De-Identified Laramie Obfuscation** Would recommend follow up evaluation for refill of antibiotics and codeine cough syrup.   Laroy Apple, MD [facility] 08/22/2016, 12:27 PM

## 2016-08-22 NOTE — Telephone Encounter (Signed)
**Note De-Identified Alesi Obfuscation** LMOVM that pt NTBS she maybe on her way here to her husbands appt

## 2016-08-22 NOTE — Progress Notes (Unsigned)
**Note De-Identified Hellard Obfuscation** Levaquin sent per Dr. Wendi Snipes.

## 2016-11-28 ENCOUNTER — Ambulatory Visit: Payer: BLUE CROSS/BLUE SHIELD | Admitting: Family Medicine

## 2016-12-01 ENCOUNTER — Ambulatory Visit: Payer: BLUE CROSS/BLUE SHIELD | Admitting: Family Medicine

## 2016-12-12 ENCOUNTER — Ambulatory Visit (INDEPENDENT_AMBULATORY_CARE_PROVIDER_SITE_OTHER): Payer: BLUE CROSS/BLUE SHIELD | Admitting: Family Medicine

## 2016-12-12 ENCOUNTER — Encounter: Payer: Self-pay | Admitting: Family Medicine

## 2016-12-12 VITALS — BP 138/78 | HR 62 | Temp 97.1°F | Ht 64.0 in | Wt 174.0 lb

## 2016-12-12 DIAGNOSIS — Z8639 Personal history of other endocrine, nutritional and metabolic disease: Secondary | ICD-10-CM | POA: Diagnosis not present

## 2016-12-12 DIAGNOSIS — M7660 Achilles tendinitis, unspecified leg: Secondary | ICD-10-CM

## 2016-12-12 DIAGNOSIS — L290 Pruritus ani: Secondary | ICD-10-CM

## 2016-12-12 DIAGNOSIS — M6788 Other specified disorders of synovium and tendon, other site: Secondary | ICD-10-CM

## 2016-12-12 MED ORDER — MEBENDAZOLE 100 MG PO CHEW: 100.0000 mg | CHEWABLE_TABLET | Freq: Once | ORAL | 0 refills | Status: AC

## 2016-12-12 MED ORDER — ESTROGENS CONJUGATED 0.625 MG PO TABS: 0.6250 mg | ORAL_TABLET | Freq: Every day | ORAL | 5 refills | Status: DC

## 2016-12-12 MED ORDER — DICYCLOMINE HCL 20 MG PO TABS: 20.0000 mg | ORAL_TABLET | Freq: Four times a day (QID) | ORAL | 5 refills | Status: DC

## 2016-12-12 MED ORDER — SERTRALINE HCL 100 MG PO TABS: 100.0000 mg | ORAL_TABLET | Freq: Every day | ORAL | 3 refills | Status: DC

## 2016-12-12 MED ORDER — PITAVASTATIN CALCIUM 2 MG PO TABS: 2.0000 mg | ORAL_TABLET | Freq: Every day | ORAL | 11 refills | Status: DC

## 2016-12-12 MED ORDER — CYCLOBENZAPRINE HCL 5 MG PO TABS
5.0000 mg | ORAL_TABLET | Freq: Three times a day (TID) | ORAL | 5 refills | Status: DC | PRN
Start: 2016-12-12 — End: 2017-12-22

## 2016-12-12 NOTE — Patient Instructions (Addendum)
**Note De-Identified Guarnieri Obfuscation** Great to see you!  I have sent in [place] to help with pinworms  You will hear from [place] for a sports medicine referral.

## 2016-12-12 NOTE — Progress Notes (Signed)
**Note De-identified [name] Obfuscation**   **Note De-Identified [name] Obfuscation** HPI  Patient presents today to discuss heel pain and anal itching. Anal itching Has been going on for 6 months or so, no visible problems. Has not performed discussed take tests. She states that she is concerned about parasites given that she cleans her Liver frequently.  Achilles tendon problem. Patient states that she's had long-term waxing waning symptoms of swelling and tenderness over the right Achilles tendon, when this happens she has some addition of pain down to the right foot, however more concerning radiation of pain up to the right knee with knee instability and "giving out" sensation at times. She would like to see a specialist to discuss this problem.  PMH: Smoking status noted ROS: Per HPI  Objective: BP 138/78   Pulse 62   Temp 97.1 F (36.2 C) (Oral)   Ht 5\' 4"  (1.626 m)   Wt 174 lb (78.9 kg)   BMI 29.87 kg/m  Gen: NAD, alert, cooperative with exam HEENT: NCAT CV: RRR, good S1/S2, no murmur Resp: CTABL, no wheezes, non-labored Ext: No edema, warm Neuro: Alert and oriented, No gross deficits  Assessment and plan:  # Achilles tendinosis Patient with off and on symptoms of the Achilles tendon with tenderness and swelling. None observed on exam today. Refer to sports medicine, appreciate the recommendations and management  # Anal itching, new problem Most likely diagnosis is pinworms, treat with mebendazole. RTC as needed  Refills sent for long term meds.   livalo mistakenly sent, canceled at pharmacy and discussed with patient  Hx of DM2 Previous A1C very well controlled, plan annual checks Good lifestyle choices.    Meds ordered this encounter  Medications  . cyclobenzaprine (FLEXERIL) 5 MG tablet    Sig: Take 1 tablet (5 mg total) by mouth 3 (three) times daily as needed for muscle spasms.    Dispense:  60 tablet    Refill:  5  . dicyclomine (BENTYL) 20 MG tablet    Sig: Take 1 tablet (20 mg total) by mouth every 6 (six) hours.    Dispense:   120 tablet    Refill:  5  . estrogens, conjugated, (PREMARIN) 0.625 MG tablet    Sig: Take 1 tablet (0.625 mg total) by mouth daily.    Dispense:  30 tablet    Refill:  5  . sertraline (ZOLOFT) 100 MG tablet    Sig: Take 1 tablet (100 mg total) by mouth daily.    Dispense:  90 tablet    Refill:  3  . DISCONTD: Pitavastatin Calcium (LIVALO) 2 MG TABS    Sig: Take 1 tablet (2 mg total) by mouth daily.    Dispense:  30 tablet    Refill:  11  . mebendazole (VERMOX) 100 MG chewable tablet    Sig: Chew 1 tablet (100 mg total) by mouth once. Repeat in 2 weeks    Dispense:  2 tablet    Refill:  [place], MD [facility] 12/12/2016, 10:21 AM

## 2016-12-25 ENCOUNTER — Encounter: Payer: Self-pay | Admitting: Sports Medicine

## 2016-12-25 ENCOUNTER — Ambulatory Visit: Payer: Self-pay

## 2016-12-25 ENCOUNTER — Ambulatory Visit (INDEPENDENT_AMBULATORY_CARE_PROVIDER_SITE_OTHER): Payer: BLUE CROSS/BLUE SHIELD | Admitting: Sports Medicine

## 2016-12-25 VITALS — BP 132/72 | HR 78 | Ht 64.0 in | Wt 175.0 lb

## 2016-12-25 DIAGNOSIS — M9261 Juvenile osteochondrosis of tarsus, right ankle: Secondary | ICD-10-CM

## 2016-12-25 DIAGNOSIS — G8929 Other chronic pain: Secondary | ICD-10-CM

## 2016-12-25 DIAGNOSIS — M25571 Pain in right ankle and joints of right foot: Secondary | ICD-10-CM

## 2016-12-25 MED ORDER — NITROGLYCERIN 0.2 MG/HR TD PT24: MEDICATED_PATCH | TRANSDERMAL | 1 refills | Status: DC

## 2016-12-25 MED ORDER — DICLOFENAC SODIUM 2 % TD SOLN: 1.0000 "application " | Freq: Two times a day (BID) | TRANSDERMAL | 0 refills | Status: AC

## 2016-12-25 NOTE — Patient Instructions (Signed)
**Note De-Identified Zook Obfuscation** I recommend you obtained a compression sleeve to help with your joint problems. There are many options on the market however I recommend obtaining a right ankle Body Helix compression sleeve.  You can find information (including how to appropriate measure yourself for sizing) can be found at www.Body http://www.lambert.com/.  Many of these products are health savings account (HSA) eligible.   You can use the compression sleeve at any time throughout the day but is most important to use while being active as well as for 2 hours post-activity.   It is appropriate to ice following activity with the compression sleeve in place.

## 2016-12-25 NOTE — Progress Notes (Signed)
**Note De-Identified Friedmann Obfuscation** OFFICE VISIT NOTE [name]. Raymon Schlarb, [place] at [facility] [phone]  Faria Casella Reza - 66 y.o. female MRN [id number]  Date of birth: [birth year]  Visit Date: 12/25/2016  PCP: Timmothy Euler, MD   Referred by: Timmothy Euler, MD  Frutoso Chase, RT(R) acting as scribe for Dr. Paulla Fore.  SUBJECTIVE:   Chief Complaint  Patient presents with  . Right Achilles Tendon Pain   HPI: As below and per problem based documentation when appropriate.  Pt presents today with complaint of pain in the right achilles tendon. Over last several years pt has episodes of not being able to bear weight on right leg. Pain down through ankle. Unable to put shoes on. Had x-rays back in 2015.  Was told she had tendonitis.  Chronic pain now. Had another episode three months ago where she could not bear weight on right leg.  Pain around achilles tendon and goes around lateral right ankle. Pt also notes some sharp pain in right knee about a month ago; she feels it is related to right ankle pain.   Pt describes pain as sharp. Pt rates pain 5-7 out of 10 when walking.    Mild relief with ace bandage. Pennsaid provided some relief. Mild relief with Tylenol. Has not tried icing ankle. Mild relief when heat is applied.        Review of Systems  Constitutional: Positive for malaise/fatigue. Negative for chills and fever.  Respiratory: Negative for shortness of breath and wheezing.   Cardiovascular: Positive for leg swelling. Negative for chest pain and palpitations.  Musculoskeletal: Positive for joint pain. Negative for falls.  Neurological: Positive for dizziness and headaches. Negative for tingling.  Endo/Heme/Allergies: Bruises/bleeds easily.    Otherwise per HPI.  HISTORY & PERTINENT PRIOR DATA:  No specialty comments available. She reports that she has never smoked. She has never used smokeless tobacco.   Recent Labs  06/02/16 1044  HGBA1C 5.5    Medications & Allergies reviewed per EMR Patient Active Problem List   Diagnosis Date Noted  . Haglund's deformity of right heel 02/02/2017  . Postmenopausal symptoms 06/02/2016  . History of diabetes mellitus 11/13/2015  . Atrial flutter, paroxysmal ([place]) 09/05/2014  . Anxiety 07/19/2014  . Arthritis of neck ([place]) 07/19/2014  . Acid reflux 07/19/2014  . Adaptive colitis 07/19/2014  . Nasal cavity polyp 07/19/2014  . Bariatric surgery status 07/19/2014  . Avitaminosis D 07/19/2014  . Vernal limbic conjunctivitis of both eyes 12/18/2013  . Atrial fibrillation ([place]) 11/26/2013  . Personal history of colonic polyps - sessile serrated 11/23/2013  . Allergic rhinitis 11/11/2011  . UNSPECIFIED HYPERTROPHIC&ATROPHIC CONDITION SKIN 06/04/2010  . OSTEOPENIA 06/04/2010  . NECK PAIN 03/18/2010  . ALLERGIC URTICARIA 03/27/2009  . B12 DEFICIENCY 05/28/2007  . DEPRESSION 03/08/2007  . Essential hypertension 03/08/2007   Past Medical History:  Diagnosis Date  . Allergy   . Atrial flutter ([place])    typical appearing  . Cancer ([place])    basal ca - nose - 1985  . Diabetes mellitus    type 2-no longer since gastric in 2005  . GERD (gastroesophageal reflux disease)   . Hypertension   . Hypokalemia   . Osteoporosis 2000  . Paroxysmal atrial fibrillation ([facility])   . Personal history of colonic polyps - sessile serrated 11/23/2013   Family History  Problem Relation Age of Onset  . Diabetes Mother   . Heart disease Mother   . Arthritis Mother   . **Note De-Identified Seneca Obfuscation** Hearing loss Mother   . Hyperlipidemia Mother   . Hypertension Mother   . Kidney disease Mother   . Learning disabilities Mother   . Miscarriages / [place] Mother   . Heart disease Father   . Hypertension Father   . Alcohol abuse Father   . Hyperlipidemia Father   . Emphysema Maternal Grandmother   . Cancer Maternal Grandmother        throat and lung(smoker)  . Heart disease Brother   . Cancer Brother        lung  . Colon cancer  Maternal Grandfather   . Cancer Maternal Grandfather        colon  . Scleroderma Sister   . Early death Sister        schlederma  . Miscarriages / Stillbirths Sister   . Stroke Brother   . Emphysema Sister   . Diabetes Sister   . Hyperlipidemia Sister   . Hypertension Sister   . Mental illness Sister   . Miscarriages / Stillbirths Sister   . Stroke Sister   . Lung cancer Brother        smoker  . Diabetes Brother   . Diabetes Sister   . Pancreatic cancer Neg Hx   . Rectal cancer Neg Hx   . Stomach cancer Neg Hx    Past Surgical History:  Procedure Laterality Date  . APPENDECTOMY    . ATRIAL FLUTTER ABLATION N/A 09/05/2014   Procedure: ATRIAL FLUTTER ABLATION;  Surgeon: Thompson Grayer, MD;  Location: [facility] C[facility] LAB;  Service: Cardiovascular;  Laterality: N/A;  . CARPAL TUNNEL RELEASE Bilateral   . FOOT SURGERY Bilateral 2012   shorten bones in both feet  . GASTRIC BYPASS    . removal of ovary Right 1987  . SPINE SURGERY  2000/2012  . TOTAL ABDOMINAL HYSTERECTOMY  1999   Social History   Occupational History  . [employer]   Social History Main Topics  . Smoking status: Never Smoker  . Smokeless tobacco: Never Used  . Alcohol use No  . Drug use: No  . Sexual activity: No    OBJECTIVE:  VS:  HT:5\' 4"  (162.6 cm)   WT:175 lb (79.4 kg)  BMI:30.1    BP:132/72  HR:78bpm  TEMP: ( )  RESP:95 % EXAM: Findings:  WDWN, NAD, Non-toxic appearing Alert & appropriately interactive Not depressed or anxious appearing No increased work of breathing. Pupils are equal. EOM intact without nystagmus No clubbing or cyanosis of the extremities appreciated No significant rashes/lesions/ulcerations overlying the examined area. DP & PT pulses 2+/4.  No significant pretibial edema. Sensation intact to light touch in lower extremities.  Right ankle: Overall well aligned.  She does have a prominent calcaneus has smooth contours.  Marked pain at the  insertion of the Achilles.  No pain with calcaneal squeeze test.  She has good dorsiflexion plantarflexion strength but does have pain with resisted plantarflexion.  Ankle drawer testing is normal.  Intrinsic ankle strength is normal.  No pain with palpation over the avascular zone of the Achilles.       No results found. ASSESSMENT & PLAN:     ICD-10-CM   1. Chronic pain of right ankle M25.571 [place] LIMITED JOINT SPACE STRUCTURES UP RIGHT(NO LINKED CHARGES)   G89.29   2. Haglund's deformity of right heel M92.61 Diclofenac Sodium (PENNSAID) 2 % SOLN    nitroGLYCERIN (NITRODUR - DOSED IN MG/24 HR) 0.2 mg/hr patch  ================================================================= Haglund's deformity of right **Note De-Identified Pharris Obfuscation** heel We discussed multiple options today including immobilization versus compression.   Body Helix ankle sleeve provided.   To help decrease acute pain will have her start with topical  Pennsaid. Can transition her to nitroglycerin protocol in 2 weeks to help with slight tendinopathic changes. ================================================================= Patient Instructions  I recommend you obtained a compression sleeve to help with your joint problems. There are many options on the market however I recommend obtaining a right ankle Body Helix compression sleeve.  You can find information (including how to appropriate measure yourself for sizing) can be found at www.Body http://www.lambert.com/.  Many of these products are health savings account (HSA) eligible.   You can use the compression sleeve at any time throughout the day but is most important to use while being active as well as for 2 hours post-activity.   It is appropriate to ice following activity with the compression sleeve in place.  ================================================================= Future Appointments Date Time Provider [place]  02/05/2017 11:45 AM Gerda Diss, DO [facility] None  06/18/2017 8:10 AM Timmothy Euler, MD [facility] None    Follow-up: Return in about 6 weeks (around 02/05/2017).   CMA/ATC served as Education administrator during this visit. History, Physical, and Plan performed by medical provider. Documentation and orders reviewed and attested to.      Teresa Coombs, [place] [facility] Physician

## 2017-02-02 DIAGNOSIS — M9261 Juvenile osteochondrosis of tarsus, right ankle: Secondary | ICD-10-CM | POA: Insufficient documentation

## 2017-02-02 NOTE — Procedures (Signed)
**Note De-Identified Frisby Obfuscation** LIMITED MSK ULTRASOUND OF Right Heel Images were obtained and interpreted by myself, Teresa Coombs, DO  Images have been saved and stored to PACS system. Images obtained on: GE S7 Ultrasound machine  FINDINGS:   Slight fragmentation of the posterior calcaneus consistent with deformity with hypoechoic changes within the mild.  No significant thickening of the Achilles   Within the avascular zone however insertional neovascularity is present.  IMPRESSION:  1. Haglund's deformity with slight insertional Achilles tendinosis.

## 2017-02-02 NOTE — Assessment & Plan Note (Signed)
**Note De-Identified Tellez Obfuscation** We discussed multiple options today including immobilization versus compression.   Body Helix ankle sleeve provided.   To help decrease acute pain will have her start with topical  Pennsaid. Can transition her to nitroglycerin protocol in 2 weeks to help with slight tendinopathic changes.

## 2017-02-05 ENCOUNTER — Ambulatory Visit: Payer: BLUE CROSS/BLUE SHIELD | Admitting: Sports Medicine

## 2017-04-01 DIAGNOSIS — K621 Rectal polyp: Secondary | ICD-10-CM | POA: Diagnosis not present

## 2017-04-01 DIAGNOSIS — D123 Benign neoplasm of transverse colon: Secondary | ICD-10-CM | POA: Diagnosis not present

## 2017-05-28 ENCOUNTER — Ambulatory Visit: Payer: Self-pay

## 2017-05-28 ENCOUNTER — Encounter: Payer: Self-pay | Admitting: Sports Medicine

## 2017-05-28 ENCOUNTER — Ambulatory Visit: Payer: BLUE CROSS/BLUE SHIELD | Admitting: Sports Medicine

## 2017-05-28 DIAGNOSIS — G8929 Other chronic pain: Secondary | ICD-10-CM | POA: Insufficient documentation

## 2017-05-28 DIAGNOSIS — E559 Vitamin D deficiency, unspecified: Secondary | ICD-10-CM | POA: Diagnosis not present

## 2017-05-28 DIAGNOSIS — M79671 Pain in right foot: Secondary | ICD-10-CM

## 2017-05-28 DIAGNOSIS — M4726 Other spondylosis with radiculopathy, lumbar region: Secondary | ICD-10-CM | POA: Diagnosis not present

## 2017-05-28 DIAGNOSIS — M47816 Spondylosis without myelopathy or radiculopathy, lumbar region: Secondary | ICD-10-CM | POA: Insufficient documentation

## 2017-05-28 DIAGNOSIS — M79672 Pain in left foot: Secondary | ICD-10-CM

## 2017-05-28 DIAGNOSIS — M722 Plantar fascial fibromatosis: Secondary | ICD-10-CM

## 2017-05-28 DIAGNOSIS — M47812 Spondylosis without myelopathy or radiculopathy, cervical region: Secondary | ICD-10-CM

## 2017-05-28 DIAGNOSIS — M25531 Pain in right wrist: Secondary | ICD-10-CM

## 2017-05-28 LAB — VITAMIN D 25 HYDROXY (VIT D DEFICIENCY, FRACTURES): VITD: 25.41 ng/mL — ABNORMAL LOW (ref 30.00–100.00)

## 2017-05-28 MED ORDER — GABAPENTIN 300 MG PO CAPS: ORAL_CAPSULE | ORAL | 1 refills | Status: DC

## 2017-05-28 NOTE — Assessment & Plan Note (Signed)
**Note De-Identified Socarras Obfuscation** Question potential radicular symptoms.  Exam is otherwise reassuring.

## 2017-05-28 NOTE — Procedures (Signed)
**Note De-Identified Engel Obfuscation** LIMITED MSK ULTRASOUND OF bilateral feet and plantar fascia Images were obtained and interpreted by myself, Teresa Coombs, DO  Images have been saved and stored to PACS system. Images obtained on: GE S7 Ultrasound machine  FINDINGS:   Right: Slightly thickened longitudinal arch at 0.18 cm at 4 cm distal to the calcaneus  Slightly thickened plantar fascial origin measuring 0.40 cm maximal diameter  Left thickened longitudinal arch is 0.2 cm and 4 cm distal to the calcaneus  Thickened plantar fascial origin with increased hypoechoic change measuring 0.48 cm in maximal diameter and there is pain with sono palpation.  IMPRESSION:  1. Bilateral longitudinal arch strain 2. Left greater than right plantar fasciitis, mild

## 2017-05-28 NOTE — Progress Notes (Signed)
**Note De-Identified Bodnar Obfuscation** [name], [place] at [place]  Sudie Bandel Kilbourne - 66 y.o. female MRN [id number]  Date of birth: [birth year]  Visit Date: 05/28/2017  PCP: Timmothy Euler, MD   Referred by: Timmothy Euler, MD   Scribe for today's visit: Josepha Pigg, CMA    SUBJECTIVE:  Felicia Schultz is here for chronic RT ankle pain and bilateral heel pain   Her bilateral heel pain symptoms INITIALLY: Began about 1.5 weeks ago and has been progressively getting worse. She denies any abnormal activity that would be causing the pain.  Described as severe pain like walking on cut glass. When she isn't on her feet she can feel both feet throbbing. Pain radiates from the heels to the toes, mostly on the bottom of her feet. Pain will sometimes cause cramping in her feet at night.  Worsened with walking, unbearable when walking without shoes.  Improved with rest, wearing padded shoes.  Additional associated symptoms include: Pt reports increased lower back pain since her heel sx started. She has experienced bilateral hip pain as well. She denies pain in either leg. She has experienced similar pain years ago after wearing a different pair of shoes but sx resolved after about 2 days.     At this time symptoms are improving slightly since she has stayed off of her feet x 2 days. She says "its a tad bit better but far from gone".  She has been taking Ibuprofen with minimal relief. She has also been applying Pennsaid with some relief. She has not tried applying heat or ice because her feet are already very tender to palpation.    ROS Reports night time disturbances. Denies fevers, chills, or night sweats. Denies unexplained weight loss. Reports personal history of cancer, basal cell carcinoma on nose in 1980s. Denies changes in bowel or bladder habits. Denies recent unreported falls. Denies new or worsening dyspnea or wheezing. Denies headaches or  dizziness.  Reports numbness, tingling or weakness  In the extremities - RT wrist weakness s/p surgery to remove ganglion cyst ([facility]).  Denies dizziness or presyncopal episodes Denies lower extremity edema     HISTORY & PERTINENT PRIOR DATA:  Prior History reviewed and updated per electronic medical record.  Significant history, findings, studies and interim changes include:  reports that  has never smoked. she has never used smokeless tobacco. Recent Labs    06/02/16 1044  HGBA1C 5.5   No specialty comments available. Problem  Spondylosis of Lumbar Joint   Multilevel degenerative changes most notably at L4-L5 on plain film x-rays from 2016   Plantar Fasciitis, Bilateral   Slightly thickened on MSK ultrasound 05/28/2017 Moderate rigid cavus foot Can consider custom cushion orthotics if she is interested in this in the future.   Chronic Pain of Right Wrist   Status post right wrist ganglion excision, summer 2018 with Dr. Apolonio Schneiders   Avitaminosis D     OBJECTIVE:  VS:  HT:    WT:   BMI:     BP:   HR: bpm  TEMP: ( )  RESP:   PHYSICAL EXAM: Constitutional: WDWN, Non-toxic appearing. Psychiatric: Alert & appropriately interactive. Not depressed or anxious appearing. Respiratory: No increased work of breathing. Trachea Midline Eyes: Pupils are equal. EOM intact without nystagmus. No scleral icterus Cardiovascular:  Peripheral Pulses: peripheral pulses symmetrical No clubbing or cyanosis appreciated Capillary Refill is normal, less than 2 seconds No signficant generalized edema/anasarca Sensory **Note De-Identified Rotz Obfuscation** Exam: intact to light touch however she does have a generalized hyperesthesia across the bilateral plantar surface of her feet  Bilateral Foot & Ankle Exam: No overlying erythema/ecchymosis.  onychomycosis of the toenails General Alignment: Normal alignment & Contours Long Arch: Moderate, rigid longitudinal arch Hind Foot Alignment: Calcaneal vaurus Posterior Tibialis  Recruitment: normal Transverse Arch: abnormal: see below Splay Toe present: Yes, bilateral Hammer Toes present: Yes, bilateral Bunion Present: Yes, bilateral early Bunionette Present: Yes, bilateral, early  Morton's Callus: Yes, bilateral Palpation:   Navicular: tender mild Base of the 5th metatarsal: nontender Posterior aspect of MEDIAL malleolus: nontender Posterior aspect of LATERAL mallelous: nontender Generalized tenderness across the entire plantar surface of the foot.  Negative Tinel's over the tarsal tunnel.    ROM: normal Strength: No significant weakness with Inversion, eversion, dorsiflexion and plantar flexion Ankle Stablity: stable to testing and No significant pain with midfoot abduction   Right wrist: Overall well aligned.  Postsurgical incisions well-healed.  She has a small amount of pain with palpation of the dorsal aspect of the wrist but no significant wrist effusion.  Pain with resisted ulnar deviation and radial deviation that is mild not easily reproducible.  Pain with resisted finger abduction.  Pain in dysesthesia do seem to localize into the ulnar distribution   ASSESSMENT & PLAN:   1. Heel pain, bilateral   2. Avitaminosis D   3. Osteoarthritis of spine with radiculopathy, lumbar region   4. Plantar fasciitis, bilateral   5. Arthritis of neck   6. Chronic pain of right wrist    PLAN:    Spondylosis of lumbar joint Patient does have left greater than right plantar fasciitis but this seems to be mild in her symptoms do seem to be exaggerated beyond what I would expect.  I am concerned for potential S1 radiculitis we will go ahead and start her on gabapentin which may also be beneficial for the right upper extremity pain that she is having following the ganglion cyst excision.  Avitaminosis D Patient is wondering if her chronic vitamin D deficiency has been contributing to some of the pain she is experiencing.  Given the recent weather change and time  since this was last checked with high-dose supplementation we will go ahead and recheck this today at her request.  Plantar fasciitis, bilateral Slightly thickened on MSK ultrasound 05/28/2017 Moderate rigid cavus foot Can consider custom cushion orthotics if she is interested in this in the future. Longitudinal metatarsal arch support added to her shoes today. Alfredson exercises with additional home exercises reviewed per prior encounter. Left greater than right plantar fasciitis appreciated on MSK ultrasound Concern for potential S1 radiculitis however given the distribution and severity of her pain even at rest.  Chronic pain of right wrist Question potential radicular symptoms.  Exam is otherwise reassuring.   ++++++++++++++++++++++++++++++++++++++++++++ Orders & Meds: Orders Placed This Encounter  Procedures  . [place] LIMITED JOINT SPACE STRUCTURES LOW BILAT(NO LINKED CHARGES)  . VITAMIN D 25 Hydroxy (Vit-D Deficiency, Fractures)    Meds ordered this encounter  Medications  . gabapentin (NEURONTIN) 300 MG capsule    Sig: Start with 1 tab po qhs X 1 week, then increase to 1 tab po bid X 1 week then 1 tab po tid prn    Dispense:  90 capsule    Refill:  1    ++++++++++++++++++++++++++++++++++++++++++++ Follow-up: Return in about 6 weeks (around 07/09/2017).   Pertinent documentation may be included in additional procedure notes, imaging studies, problem based **Note De-Identified Galbreath Obfuscation** documentation and patient instructions. Please see these sections of the encounter for additional information regarding this visit. CMA/ATC served as Education administrator during this visit. History, Physical, and Plan performed by medical provider. Documentation and orders reviewed and attested to.      Gerda Diss, [place] [facility] Physician

## 2017-05-28 NOTE — Assessment & Plan Note (Signed)
**Note De-Identified Saldarriaga Obfuscation** Patient is wondering if her chronic vitamin D deficiency has been contributing to some of the pain she is experiencing.  Given the recent weather change and time since this was last checked with high-dose supplementation we will go ahead and recheck this today at her request.

## 2017-05-28 NOTE — Assessment & Plan Note (Signed)
**Note De-Identified Solanki Obfuscation** Patient does have left greater than right plantar fasciitis but this seems to be mild in her symptoms do seem to be exaggerated beyond what I would expect.  I am concerned for potential S1 radiculitis we will go ahead and start her on gabapentin which may also be beneficial for the right upper extremity pain that she is having following the ganglion cyst excision.

## 2017-05-28 NOTE — Assessment & Plan Note (Signed)
**Note De-Identified Wilkinson Obfuscation** Slightly thickened on MSK ultrasound 05/28/2017 Moderate rigid cavus foot Can consider custom cushion orthotics if she is interested in this in the future. Longitudinal metatarsal arch support added to her shoes today. Alfredson exercises with additional home exercises reviewed per prior encounter. Left greater than right plantar fasciitis appreciated on MSK ultrasound Concern for potential S1 radiculitis however given the distribution and severity of her pain even at rest.

## 2017-05-29 ENCOUNTER — Other Ambulatory Visit: Payer: Self-pay | Admitting: Sports Medicine

## 2017-05-29 ENCOUNTER — Other Ambulatory Visit: Payer: Self-pay

## 2017-05-29 MED ORDER — CHOLECALCIFEROL 1.25 MG (50000 UT) PO TABS: 1.0000 | ORAL_TABLET | ORAL | 0 refills | Status: DC

## 2017-05-29 NOTE — Progress Notes (Signed)
**Note De-Identified Bellisario Obfuscation** Your vitamin D level has stayed low unfortunately.  I would like to actually transition the medicine that you are taking from ergocalciferol to cholecalciferol which is a different form of vitamin D that you may do better with.  Given the different potency I would like for you to only take this twice per week however.  We will need to recheck your levels in 12 weeks

## 2017-05-29 NOTE — Progress Notes (Signed)
**Note De-Identified Dunlop Obfuscation** Patient has been on a year of cholecalciferol 50,000 units 3 times weekly without significant improvements in her vitamin D level.  Occasionally this can be associated with poor conversion from ergocalciferol.  There are some studies that do support the use of cholecalciferol in this case and I will transition her to this.  Result note through my chart sent.  This will need to be rechecked in 12 weeks

## 2017-06-18 ENCOUNTER — Ambulatory Visit: Payer: BLUE CROSS/BLUE SHIELD | Admitting: Family Medicine

## 2017-06-18 ENCOUNTER — Encounter: Payer: Self-pay | Admitting: Family Medicine

## 2017-06-18 VITALS — BP 131/81 | HR 68 | Temp 97.0°F | Ht 64.0 in | Wt 180.4 lb

## 2017-06-18 DIAGNOSIS — M79673 Pain in unspecified foot: Secondary | ICD-10-CM

## 2017-06-18 DIAGNOSIS — I4892 Unspecified atrial flutter: Secondary | ICD-10-CM

## 2017-06-18 DIAGNOSIS — E559 Vitamin D deficiency, unspecified: Secondary | ICD-10-CM

## 2017-06-18 MED ORDER — PREGABALIN 75 MG PO CAPS
75.0000 mg | ORAL_CAPSULE | Freq: Two times a day (BID) | ORAL | 2 refills | Status: DC
Start: 2017-06-18 — End: 2017-07-10

## 2017-06-18 NOTE — Progress Notes (Signed)
**Note De-identified Mccorkel Obfuscation**   **Note De-Identified Welch Obfuscation** HPI  Patient presents today follow-up for chronic medical conditions.  Patient is having sedation with gabapentin, however is having improvement of heel pain. Would like to try Lyrica.  Patient has history of atrial flutter status post ablation, from records reviewed today she appears to have been maintaining sinus rhythm since. She does not want to use blood thinners. She does take a daily aspirin.  She is now taking 50,000 units of vitamin D3 twice weekly. Tolerating well  PMH: Smoking status noted ROS: Per HPI  Objective: BP 131/81   Pulse 68   Temp (!) 97 F (36.1 C) (Oral)   Ht '5\' 4"'$  (1.626 m)   Wt 180 lb 6.4 oz (81.8 kg)   BMI 30.97 kg/m  Gen: NAD, alert, cooperative with exam HEENT: NCAT CV: No murmur, regular rate Resp: CTABL, no wheezes, non-labored Ext: No edema, warm Neuro: Alert and oriented, No gross deficits  Assessment and plan:  #Atrial flutter Status post ablation, appears to be maintaining sinus rhythm Aspirin Patient does not desire anticoagulation  #Avitaminnosis D Patient being treated twice weekly with 50,000 units of vitamin D3  #Heel pain Improvement with gabapentin Sedation with gabapentin at 303 times daily, will try Lyrica 75 twice daily    Orders Placed This Encounter  Procedures  . CMP14+EGFR  . CBC with Differential/Platelet  . Lipid panel    Meds ordered this encounter  Medications  . pregabalin (LYRICA) 75 MG capsule    Sig: Take 1 capsule (75 mg total) by mouth 2 (two) times daily.    Dispense:  60 capsule    Refill:  [place], MD Hodg[place] 06/18/2017, 8:50 AM

## 2017-06-18 NOTE — Patient Instructions (Signed)
**Note De-Identified Mikelson Obfuscation** Great to see you!  Come back in 6 months unless you need [place] sooner.   Stop gabapentin when you start lyrica.

## 2017-06-19 LAB — CMP14+EGFR
ALT: 14 IU/L (ref 0–32)
AST: 19 IU/L (ref 0–40)
Albumin/Globulin Ratio: 1.8 (ref 1.2–2.2)
Albumin: 4.1 g/dL (ref 3.6–4.8)
Alkaline Phosphatase: 109 IU/L (ref 39–117)
BUN/Creatinine Ratio: 26 (ref 12–28)
BUN: 16 mg/dL (ref 8–27)
Bilirubin Total: 0.5 mg/dL (ref 0.0–1.2)
CO2: 24 mmol/L (ref 20–29)
Calcium: 9.2 mg/dL (ref 8.7–10.3)
Chloride: 107 mmol/L — ABNORMAL HIGH (ref 96–106)
Creatinine, Ser: 0.62 mg/dL (ref 0.57–1.00)
GFR calc Af Amer: 110 mL/min/{1.73_m2} (ref >59)
GFR calc non Af Amer: 96 mL/min/{1.73_m2} (ref >59)
Globulin, Total: 2.3 g/dL (ref 1.5–4.5)
Glucose: 91 mg/dL (ref 65–99)
Potassium: 4.4 mmol/L (ref 3.5–5.2)
Sodium: 144 mmol/L (ref 134–144)
Total Protein: 6.4 g/dL (ref 6.0–8.5)

## 2017-06-19 LAB — CBC WITH DIFFERENTIAL/PLATELET
Basophils Absolute: 0 10*3/uL (ref 0.0–0.2)
Basos: 1 %
EOS (ABSOLUTE): 0.1 10*3/uL (ref 0.0–0.4)
Eos: 1 %
Hematocrit: 39.7 % (ref 34.0–46.6)
Hemoglobin: 12.9 g/dL (ref 11.1–15.9)
Immature Grans (Abs): 0 10*3/uL (ref 0.0–0.1)
Immature Granulocytes: 0 %
Lymphocytes Absolute: 1.6 10*3/uL (ref 0.7–3.1)
Lymphs: 33 %
MCH: 30.5 pg (ref 26.6–33.0)
MCHC: 32.5 g/dL (ref 31.5–35.7)
MCV: 94 fL (ref 79–97)
Monocytes Absolute: 0.4 10*3/uL (ref 0.1–0.9)
Monocytes: 8 %
Neutrophils Absolute: 2.8 10*3/uL (ref 1.4–7.0)
Neutrophils: 57 %
Platelets: 221 10*3/uL (ref 150–379)
RBC: 4.23 x10E6/uL (ref 3.77–5.28)
RDW: 13.6 % (ref 12.3–15.4)
WBC: 5 10*3/uL (ref 3.4–10.8)

## 2017-06-19 LAB — LIPID PANEL
Chol/HDL Ratio: 2 ratio (ref 0.0–4.4)
Cholesterol, Total: 151 mg/dL (ref 100–199)
HDL: 74 mg/dL (ref >39)
LDL Calculated: 65 mg/dL (ref 0–99)
Triglycerides: 59 mg/dL (ref 0–149)
VLDL Cholesterol Cal: 12 mg/dL (ref 5–40)

## 2017-07-10 ENCOUNTER — Ambulatory Visit: Payer: BLUE CROSS/BLUE SHIELD | Admitting: Sports Medicine

## 2017-07-10 ENCOUNTER — Encounter: Payer: Self-pay | Admitting: Sports Medicine

## 2017-07-10 ENCOUNTER — Ambulatory Visit: Payer: Self-pay

## 2017-07-10 VITALS — BP 106/78 | HR 84 | Ht 64.0 in | Wt 176.0 lb

## 2017-07-10 DIAGNOSIS — M79672 Pain in left foot: Secondary | ICD-10-CM

## 2017-07-10 DIAGNOSIS — M79671 Pain in right foot: Secondary | ICD-10-CM

## 2017-07-10 DIAGNOSIS — G8929 Other chronic pain: Secondary | ICD-10-CM

## 2017-07-10 DIAGNOSIS — M6788 Other specified disorders of synovium and tendon, other site: Secondary | ICD-10-CM

## 2017-07-10 DIAGNOSIS — M4726 Other spondylosis with radiculopathy, lumbar region: Secondary | ICD-10-CM

## 2017-07-10 DIAGNOSIS — M9261 Juvenile osteochondrosis of tarsus, right ankle: Secondary | ICD-10-CM | POA: Diagnosis not present

## 2017-07-10 DIAGNOSIS — E559 Vitamin D deficiency, unspecified: Secondary | ICD-10-CM

## 2017-07-10 DIAGNOSIS — M722 Plantar fascial fibromatosis: Secondary | ICD-10-CM | POA: Diagnosis not present

## 2017-07-10 DIAGNOSIS — M25531 Pain in right wrist: Secondary | ICD-10-CM

## 2017-07-10 DIAGNOSIS — M7660 Achilles tendinitis, unspecified leg: Secondary | ICD-10-CM

## 2017-07-10 MED ORDER — GABAPENTIN 100 MG PO CAPS: ORAL_CAPSULE | ORAL | 2 refills | Status: DC

## 2017-07-10 NOTE — Procedures (Signed)
**Note De-Identified Belfiore Obfuscation** LIMITED MSK ULTRASOUND OF bilateral heels Images were obtained and interpreted by myself, Teresa Coombs, DO  Images have been saved and stored to PACS system. Images obtained on: GE S7 Ultrasound machine  FINDINGS:   Right plantar fascia was normal-appearing at the origin measuring 0.3 cm.  There is thickening distal to the origin suggesting long arch strain that is chronic  Overlying hypoechoic changes within the fat pad on the right foot including a longitudinal/transverse split of the plantar fat pad  Left plantar fascia is thickened measuring 0.5 cm.  There is marked thickening originating from the origin distally measuring a maximum of greater than 0.2 cm suggesting long arch strain  IMPRESSION:  1. Bilateral long arch strain 2. Left plantar fasciitis 3. Left stone bruise/fat pad contusion

## 2017-07-10 NOTE — Assessment & Plan Note (Signed)
**Note De-Identified Brazier Obfuscation** Will need recheck at f/u

## 2017-07-10 NOTE — Assessment & Plan Note (Signed)
**Note De-Identified Tippens Obfuscation** Suspect she is having a radicular component of her bilateral foot pain and she has responded well to gabapentin which has reduced the searing pain that she described as walking on glass.  She continues to have multifactorial foot and heel pain but has had good improvements. We discussed titration of her gabapentin today as well as the importance of continued core strengthening program and Goodman exercises were recommended.

## 2017-07-10 NOTE — Assessment & Plan Note (Signed)
**Note De-Identified Wittler Obfuscation** Need to f/u if better at next visit

## 2017-07-10 NOTE — Patient Instructions (Signed)
**Note De-Identified Elms Obfuscation** You have hand outs for the insoles and Strutz straps  Keep doing the achilles stretching and strengthening exercises.  Also check out [employer]" which is a program developed by Dr. Minerva Ends.   There are links to a couple of his YouTube Videos below and I would like to see performing one of his videos 5-6 days per week.    A good intro video is: "Independence from Pain 7-minute Video" - [email or web address].com   His more advanced video is: "Powerful Posture and Pain Relief: 12 minutes of Foundation Training" - https://youtu.be/[email or web address]  Do not try to attempt this entire video when first beginning.    Try breaking of each exercise that he goes into shorter segments.  Otherwise if they perform an exercise for 45 seconds, start with 15 seconds and rest and then resume when they begin the new activity.    If you work your way up to doing this 12 minute video, I expect you will see significant improvements in your pain.  If you enjoy his videos and would like to find out more you can look on his website: [email or web address]  He has a workout streaming option as well as a DVD set available for purchase.  Amazon has the best price for his DVDs.

## 2017-07-10 NOTE — Progress Notes (Signed)
**Note De-Identified Charrette Obfuscation** Felicia Schultz. Felicia Schultz, [facility]  Felicia Schultz - 67 y.o. female MRN [id number]  Date of birth: [birth year]  Visit Date: 07/10/2017  PCP: Timmothy Euler, MD   Referred by: Timmothy Euler, MD   Scribe for today's visit: Josepha Pigg, CMA     SUBJECTIVE:  Felicia Schultz is here for Follow-up (bilateral heel pain)  Her bilateral heel pain symptoms INITIALLY: Began about 1.5 weeks ago and has been progressively getting worse. She denies any abnormal activity that would be causing the pain.  Described as severe pain like walking on cut glass. When she isn't on her feet she can feel both feet throbbing. Pain radiates from the heels to the toes, mostly on the bottom of her feet. Pain will sometimes cause cramping in her feet at night.  Worsened with walking, unbearable when walking without shoes.  Improved with rest, wearing padded shoes.  Additional associated symptoms include: Pt reports increased lower back pain since her heel sx started. She has experienced bilateral hip pain as well. She denies pain in either leg. She has experienced similar pain years ago after wearing a different pair of shoes but sx resolved after about 2 days.       Compared to the last office visit, her previously described symptoms are improving, it no longer feels like she is walking on glass, now it feels like she just has bruising on the feet. Pain gets worse throughout the day and is severe at night.  Current symptoms are moderate & are nonradiating She has been taking IBU and using Pennsaid PRN.  She was started on Gabapentin at last visit. She is tolerating medication OK, only taking BID when working but TID on days off. Medication does make her slightly groggy sometimes.  Longitudinal metatarsal arch supports were added to shoes. Pt reports that arch supports are "OK".  Advised to work on [employer] exercises along with additional exercises  that were discussed at last visit. She is not having any trouble with exercises but also doesn't find them to be very helpful.  She reports occasional "twinge" of pain in lower back. She notes that chairs at work are very uncomfortable.    ROS Reports night time disturbances. Denies fevers, chills, or night sweats. Denies unexplained weight loss. Reports personal history of cancer. Denies changes in bowel or bladder habits. Denies recent unreported falls. Denies new or worsening dyspnea or wheezing. Denies headaches or dizziness.  Denies numbness, tingling or weakness  In the extremities.  Denies dizziness or presyncopal episodes Denies lower extremity edema     HISTORY & PERTINENT PRIOR DATA:  Prior History reviewed and updated per electronic medical record.  Significant history, findings, studies and interim changes include:  reports that  has never smoked. she has never used smokeless tobacco. No results for input(s): HGBA1C, LABURIC, CREATINE in the last 8760 hours. No specialty comments available. Problem  Spondylosis of Lumbar Joint   Multilevel degenerative changes most notably at L4-L5 on plain film x-rays from 2016 Some disc space loss of the L5-S1   Chronic Pain of Right Wrist   Status post right wrist ganglion excision, summer 2018 with Dr. Apolonio Schneiders   Haglund's Deformity of Right Heel  Avitaminosis D    OBJECTIVE:  VS:  HT:5\' 4"  (162.6 cm)   WT:176 lb (79.8 kg)  BMI:30.2    BP:106/78  HR:84bpm  TEMP: ( )  RESP:97 %   PHYSICAL **Note De-Identified Brach Obfuscation** EXAM: Constitutional: WDWN, Non-toxic appearing. Psychiatric: Alert & appropriately interactive.  Not depressed or anxious appearing. Respiratory: No increased work of breathing.  Trachea Midline Eyes: Pupils are equal.  EOM intact without nystagmus.  No scleral icterus  NEUROVASCULAR exam: No clubbing or cyanosis appreciated No significant venous stasis changes Capillary Refill: normal, less than 2 seconds   LOWER Extremities    Pre-tibial edema: No significant pretibial edema Pedal Pulses: Normal & symmetrically palpable Dermatomes intact to light touch Normal strength in all myotomes   Bilateral feet: Overall well aligned foot with high rigid cavus longitudinal arch.  She has pain with resisted dorsiflexion and limits 205 degrees.  Ankle exam is otherwise stable.  She has moderate tenderness over the origin of the plantar fascia on the left foot and mild over the right but has more pain directly over the posterior calcaneus on the right consistent with a stone bruise.  She is able to heel and toe walk without significant difficulty and otherwise lower extremity sensation is intact.  No additional findings.   ASSESSMENT & PLAN:   1. Haglund's deformity of right heel   2. Achilles tendinosis   3. Chronic pain of right wrist   4. Heel pain, bilateral   5. Plantar fasciitis, bilateral   6. Osteoarthritis of spine with radiculopathy, lumbar region   7. Avitaminosis D    PLAN:    Spondylosis of lumbar joint Suspect she is having a radicular component of her bilateral foot pain and she has responded well to gabapentin which has reduced the searing pain that she described as walking on glass.  She continues to have multifactorial foot and heel pain but has had good improvements. We discussed titration of her gabapentin today as well as the importance of continued core strengthening program and Goodman exercises were recommended.  Haglund's deformity of right heel Only mildly tender today.  Recommend continuing with exercises and adding compression and OTC Dr. Felicie Morn plantar fascia insoles.  Can consider custom orthotics but would like to try the conservative measures initially  Chronic pain of right wrist Need to f/u if better at next visit  Avitaminosis D Will need recheck at f/u  ++++++++++++++++++++++++++++++++++++++++++++ Orders & Meds: Orders Placed This Encounter  Procedures  . [place] LIMITED JOINT SPACE  STRUCTURES LOW BILAT(NO LINKED CHARGES)    Meds ordered this encounter  Medications  . gabapentin (NEURONTIN) 100 MG capsule    Sig: Take 2-3 capsules (200-300 mg total) by mouth at bedtime. May also take 1 capsule (100 mg total) 3 (three) times daily as needed.    Dispense:  120 capsule    Refill:  2    ++++++++++++++++++++++++++++++++++++++++++++ Follow-up: Return in about 8 weeks (around 09/04/2017).   Pertinent documentation may be included in additional procedure notes, imaging studies, problem based documentation and patient instructions. Please see these sections of the encounter for additional information regarding this visit. CMA/ATC served as Education administrator during this visit. History, Physical, and Plan performed by medical provider. Documentation and orders reviewed and attested to.      Gerda Diss, [facility] Physician

## 2017-07-10 NOTE — Assessment & Plan Note (Signed)
**Note De-Identified Altizer Obfuscation** Only mildly tender today.  Recommend continuing with exercises and adding compression and OTC Dr. Felicie Morn plantar fascia insoles.  Can consider custom orthotics but would like to try the conservative measures initially

## 2017-08-07 ENCOUNTER — Other Ambulatory Visit: Payer: Self-pay | Admitting: Sports Medicine

## 2017-08-07 MED ORDER — CHOLECALCIFEROL 1.25 MG (50000 UT) PO TABS: 1.0000 | ORAL_TABLET | ORAL | 0 refills | Status: DC

## 2017-09-04 ENCOUNTER — Ambulatory Visit: Payer: BLUE CROSS/BLUE SHIELD | Admitting: Sports Medicine

## 2017-09-18 ENCOUNTER — Ambulatory Visit: Payer: BLUE CROSS/BLUE SHIELD | Admitting: Physician Assistant

## 2017-09-18 ENCOUNTER — Encounter: Payer: Self-pay | Admitting: Physician Assistant

## 2017-09-18 VITALS — BP 115/74 | HR 87 | Temp 99.7°F | Ht 64.0 in | Wt 182.8 lb

## 2017-09-18 DIAGNOSIS — J0100 Acute maxillary sinusitis, unspecified: Secondary | ICD-10-CM

## 2017-09-18 MED ORDER — HYDROCODONE-HOMATROPINE 5-1.5 MG/5ML PO SYRP: 5.0000 mL | ORAL_SOLUTION | Freq: Four times a day (QID) | ORAL | 0 refills | Status: DC | PRN

## 2017-09-18 MED ORDER — LEVOFLOXACIN 500 MG PO TABS
500.0000 mg | ORAL_TABLET | Freq: Every day | ORAL | 0 refills | Status: DC
Start: 2017-09-18 — End: 2017-09-29

## 2017-09-18 NOTE — Progress Notes (Signed)
**Note De-Identified Steffek Obfuscation** BP 115/74   Pulse 87   Temp 99.7 F (37.6 C) (Oral)   Ht _0  (1.626 m)   Wt 182 lb 12.8 oz (82.9 kg)   BMI 31.38 kg/m    Subjective:    Patient ID: Felicia Schultz, female    DOB: 01-17-1951, 67 y.o.   MRN: [id number]  HPI: Felicia Schultz is a 67 y.o. female presenting on 09/18/2017 for Sinusitis  This patient has had many days of sinus headache and postnasal drainage. There is copious drainage at times. Denies any fever at this time. There has been a history of sinus infections in the past.  No history of sinus surgery. There is cough at night. It has become more prevalent in recent days.   Past Medical History:  Diagnosis Date  . Allergy   . Atrial flutter ([place])    typical appearing  . Cancer ([place])    basal ca - nose - 1985  . Diabetes mellitus    type 2-no longer since gastric in 2005  . GERD (gastroesophageal reflux disease)   . Hypertension   . Hypokalemia   . Osteoporosis 2000  . Paroxysmal atrial fibrillation ([facility])   . Personal history of colonic polyps - sessile serrated 11/23/2013   Relevant past medical, surgical, family and social history reviewed and updated as indicated. Interim medical history since our last visit reviewed. Allergies and medications reviewed and updated. DATA REVIEWED: CHART IN EPIC  Family History reviewed for pertinent findings.  Review of Systems  Constitutional: Positive for chills and fatigue. Negative for activity change and appetite change.  HENT: Positive for congestion, postnasal drip, sinus pressure, sinus pain and sore throat.   Eyes: Negative.   Respiratory: Positive for cough. Negative for wheezing.   Cardiovascular: Negative.  Negative for chest pain, palpitations and leg swelling.  Gastrointestinal: Negative.   Genitourinary: Negative.   Musculoskeletal: Negative.   Skin: Negative.   Neurological: Positive for headaches.    Allergies as of 09/18/2017      Reactions   Augmentin [amoxicillin-pot Clavulanate] Nausea Only   Severe nausea. Couldn't keep medication down   Clarithromycin Hives   Nitrofurantoin Other (See Comments), Rash   Unknown Palms and hand itching, turned bright red per pt   Sulfonamide Derivatives Other (See Comments)   Abdominal cramping, nausea   Nitrofurantoin Macrocrystal Swelling   Sulfa Antibiotics Nausea And Vomiting      Medication List        Accurate as of 09/18/17 10:07 AM. Always use your most recent med list.          azelastine 0.1 % nasal spray Commonly known as:  ASTELIN Place 1-2 sprays into both nostrils 2 (two) times daily. Use in each nostril as directed   Cholecalciferol 50000 units Tabs Take 1 tablet by mouth 2 (two) times a week.   cyclobenzaprine 5 MG tablet Commonly known as:  FLEXERIL Take 1 tablet (5 mg total) by mouth 3 (three) times daily as needed for muscle spasms.   dicyclomine 20 MG tablet Commonly known as:  BENTYL Take 1 tablet (20 mg total) by mouth every 6 (six) hours.   estrogens (conjugated) 0.625 MG tablet Commonly known as:  PREMARIN Take 1 tablet (0.625 mg total) by mouth daily.   gabapentin 100 MG capsule Commonly known as:  NEURONTIN Take 2-3 capsules (200-300 mg total) by mouth at bedtime. May also take 1 capsule (100 mg total) 3 (three) times daily as needed.   HYDROcodone-homatropine 5-1.5 **Note De-Identified Coates Obfuscation** MG/5ML syrup Commonly known as:  HYCODAN Take 5-10 mLs by mouth every 6 (six) hours as needed.   levofloxacin 500 MG tablet Commonly known as:  LEVAQUIN Take 1 tablet (500 mg total) by mouth daily.   sertraline 100 MG tablet Commonly known as:  ZOLOFT Take 1 tablet (100 mg total) by mouth daily.          Objective:    BP 115/74   Pulse 87   Temp 99.7 F (37.6 C) (Oral)   Ht _0  (1.626 m)   Wt 182 lb 12.8 oz (82.9 kg)   BMI 31.38 kg/m   Allergies  Allergen Reactions  . Augmentin [Amoxicillin-Pot Clavulanate] Nausea Only    Severe nausea. Couldn't keep medication down   . Clarithromycin Hives  . Nitrofurantoin  Other (See Comments) and Rash    Unknown Palms and hand itching, turned bright red per pt  . Sulfonamide Derivatives Other (See Comments)    Abdominal cramping, nausea  . Nitrofurantoin Macrocrystal Swelling  . Sulfa Antibiotics Nausea And Vomiting    Wt Readings from Last 3 Encounters:  09/18/17 182 lb 12.8 oz (82.9 kg)  07/10/17 176 lb (79.8 kg)  06/18/17 180 lb 6.4 oz (81.8 kg)    Physical Exam  Constitutional: She is oriented to person, place, and time. She appears well-developed and well-nourished.  HENT:  Head: Normocephalic and atraumatic.  Right Ear: Tympanic membrane and external ear normal. No middle ear effusion.  Left Ear: Tympanic membrane and external ear normal.  No middle ear effusion.  Nose: Mucosal edema and rhinorrhea present. Right sinus exhibits maxillary sinus tenderness and frontal sinus tenderness. Left sinus exhibits maxillary sinus tenderness and frontal sinus tenderness.  Mouth/Throat: Uvula is midline. Posterior oropharyngeal erythema present.  Eyes: Pupils are equal, round, and reactive to light. Conjunctivae and EOM are normal. Right eye exhibits no discharge. Left eye exhibits no discharge.  Neck: Normal range of motion.  Cardiovascular: Normal rate, regular rhythm and normal heart sounds.  Pulmonary/Chest: Effort normal and breath sounds normal. No respiratory distress. She has no wheezes.  Abdominal: Soft.  Lymphadenopathy:    She has no cervical adenopathy.  Neurological: She is alert and oriented to person, place, and time.  Skin: Skin is warm and dry.  Psychiatric: She has a normal mood and affect.    Results for orders placed or performed in visit on 06/18/17  CMP14+EGFR  Result Value Ref Range   Glucose 91 65 - 99 mg/dL   BUN 16 8 - 27 mg/dL   Creatinine, Ser 0.62 0.57 - 1.00 mg/dL   GFR calc non Af Amer 96 >59 mL/min/1.73   GFR calc Af Amer 110 >59 mL/min/1.73   BUN/Creatinine Ratio 26 12 - 28   Sodium 144 134 - 144 mmol/L   Potassium  4.4 3.5 - 5.2 mmol/L   Chloride 107 (H) 96 - 106 mmol/L   CO2 24 20 - 29 mmol/L   Calcium 9.2 8.7 - 10.3 mg/dL   Total Protein 6.4 6.0 - 8.5 g/dL   Albumin 4.1 3.6 - 4.8 g/dL   Globulin, Total 2.3 1.5 - 4.5 g/dL   Albumin/Globulin Ratio 1.8 1.2 - 2.2   Bilirubin Total 0.5 0.0 - 1.2 mg/dL   Alkaline Phosphatase 109 39 - 117 IU/L   AST 19 0 - 40 IU/L   ALT 14 0 - 32 IU/L  CBC with Differential/Platelet  Result Value Ref Range   WBC 5.0 3.4 - 10.8 x10E3/uL   RBC **Note De-Identified Kosch Obfuscation** 4.23 3.77 - 5.28 x10E6/uL   Hemoglobin 12.9 11.1 - 15.9 g/dL   Hematocrit 39.7 34.0 - 46.6 %   MCV 94 79 - 97 fL   MCH 30.5 26.6 - 33.0 pg   MCHC 32.5 31.5 - 35.7 g/dL   RDW 13.6 12.3 - 15.4 %   Platelets 221 150 - 379 x10E3/uL   Neutrophils 57 Not Estab. %   Lymphs 33 Not Estab. %   Monocytes 8 Not Estab. %   Eos 1 Not Estab. %   Basos 1 Not Estab. %   Neutrophils Absolute 2.8 1.4 - 7.0 x10E3/uL   Lymphocytes Absolute 1.6 0.7 - 3.1 x10E3/uL   Monocytes Absolute 0.4 0.1 - 0.9 x10E3/uL   EOS (ABSOLUTE) 0.1 0.0 - 0.4 x10E3/uL   Basophils Absolute 0.0 0.0 - 0.2 x10E3/uL   Immature Granulocytes 0 Not Estab. %   Immature Grans (Abs) 0.0 0.0 - 0.1 x10E3/uL  Lipid panel  Result Value Ref Range   Cholesterol, Total 151 100 - 199 mg/dL   Triglycerides 59 0 - 149 mg/dL   HDL 74 >39 mg/dL   VLDL Cholesterol Cal 12 5 - 40 mg/dL   LDL Calculated 65 0 - 99 mg/dL   Chol/HDL Ratio 2.0 0.0 - 4.4 ratio      Assessment & Plan:   1. Acute non-recurrent maxillary sinusitis - levofloxacin (LEVAQUIN) 500 MG tablet; Take 1 tablet (500 mg total) by mouth daily.  Dispense: 10 tablet; Refill: 0 - HYDROcodone-homatropine (HYCODAN) 5-1.5 MG/5ML syrup; Take 5-10 mLs by mouth every 6 (six) hours as needed.  Dispense: 240 mL; Refill: 0   Continue all other maintenance medications as listed above.  Follow up plan: Follow-up as needed or worsening of symptoms. Call office for any issues.   Educational handout given for [place] PA-C [facility] [address]  [place], [place] [address] [phone]   09/18/2017, 10:07 AM

## 2017-09-29 ENCOUNTER — Encounter: Payer: Self-pay | Admitting: Sports Medicine

## 2017-09-29 ENCOUNTER — Ambulatory Visit (INDEPENDENT_AMBULATORY_CARE_PROVIDER_SITE_OTHER): Payer: BLUE CROSS/BLUE SHIELD | Admitting: Sports Medicine

## 2017-09-29 VITALS — BP 122/74 | HR 78 | Ht 64.0 in | Wt 178.2 lb

## 2017-09-29 DIAGNOSIS — M79671 Pain in right foot: Secondary | ICD-10-CM

## 2017-09-29 DIAGNOSIS — M9261 Juvenile osteochondrosis of tarsus, right ankle: Secondary | ICD-10-CM | POA: Diagnosis not present

## 2017-09-29 DIAGNOSIS — G5790 Unspecified mononeuropathy of unspecified lower limb: Secondary | ICD-10-CM

## 2017-09-29 DIAGNOSIS — M79673 Pain in unspecified foot: Secondary | ICD-10-CM | POA: Diagnosis not present

## 2017-09-29 DIAGNOSIS — M7660 Achilles tendinitis, unspecified leg: Secondary | ICD-10-CM | POA: Diagnosis not present

## 2017-09-29 DIAGNOSIS — E559 Vitamin D deficiency, unspecified: Secondary | ICD-10-CM

## 2017-09-29 DIAGNOSIS — M6788 Other specified disorders of synovium and tendon, other site: Secondary | ICD-10-CM

## 2017-09-29 DIAGNOSIS — M79672 Pain in left foot: Secondary | ICD-10-CM | POA: Diagnosis not present

## 2017-09-29 DIAGNOSIS — M722 Plantar fascial fibromatosis: Secondary | ICD-10-CM

## 2017-09-29 NOTE — Progress Notes (Signed)
**Note De-Identified Lundin Obfuscation** Felicia Schultz. Felicia Schultz, [facility] at [facility] [phone]  Felicia Schultz - 67 y.o. female MRN [id number]  Date of birth: Jun 22, 1950  Visit Date: 09/29/2017  PCP: Timmothy Euler, MD   Referred by: Timmothy Euler, MD  Scribe for today's visit: Wendy Poet, LAT, ATC     SUBJECTIVE:  Felicia Schultz is here for Follow-up (B heel and Achille's pain and low back pain) .   Her bilateral heel pain symptoms INITIALLY: Began about 1.5 weeks ago and has been progressively getting worse. She denies any abnormal activity that would be causing the pain.  Described as severe pain like walking on cut glass. When she isn't on her feet she can feel both feet throbbing. Pain radiates from the heels to the toes, mostly on the bottom of her feet. Pain will sometimes cause cramping in her feet at night.  Worsened with walking, unbearable when walking without shoes.  Improved with rest, wearing padded shoes.  Additional associated symptoms include: Pt reports increased lower back pain since her heel sx started. She has experienced bilateral hip pain as well. She denies pain in either leg. She has experienced similar pain years ago after wearing a different pair of shoes but sx resolved after about 2 days.      07/10/17: Compared to the last office visit, her previously described symptoms are improving, it no longer feels like she is walking on glass, now it feels like she just has bruising on the feet. Pain gets worse throughout the day and is severe at night.  Current symptoms are moderate & are nonradiating She has been taking IBU and using Pennsaid PRN.  She was started on Gabapentin at last visit. She is tolerating medication OK, only taking BID when working but TID on days off. Medication does make her slightly groggy sometimes.  Longitudinal metatarsal arch supports were added to shoes. Pt reports that arch supports are "OK".  Advised to work on [employer]  exercises along with additional exercises that were discussed at last visit. She is not having any trouble with exercises but also doesn't find them to be very helpful.  She reports occasional "twinge" of pain in lower back. She notes that chairs at work are very uncomfortable.   09/29/17: Compared to the last office visit on 07/10/17, her previously described  B heel and Achille's symptoms are improving.  She notes marked improvement in her R heel w/ little to no pain at this point.  She reports approximately 90% improvement in her L heel. Current symptoms are moderate & are nonradiating She has been taking IBU and Gabapentin (100 mg and 300 mg 2-3x/day depending on her work schedule).  Pt con't to perform her Alfredson's exercises.  ROS Denies night time disturbances. Denies fevers, chills, or night sweats. Denies unexplained weight loss. Reports personal history of cancer - basal cell carcinoma on her nose in the 1980s Denies changes in bowel or bladder habits. Denies recent unreported falls. Denies new or worsening dyspnea or wheezing. Denies headaches or dizziness.  Denies numbness, tingling or weakness  In the extremities.  Denies dizziness or presyncopal episodes Denies lower extremity edema    HISTORY & PERTINENT PRIOR DATA:  Prior History reviewed and updated per electronic medical record.  Significant/pertinent history, findings, studies include:  reports that she has never smoked. She has never used smokeless tobacco. No results for input(s): HGBA1C, LABURIC, CREATINE in the last 8760 hours. No specialty comments **Note De-Identified Sudberry Obfuscation** available. No problems updated.  OBJECTIVE:  VS:  HT:5\' 4"  (162.6 cm)   WT:178 lb 3.2 oz (80.8 kg)  BMI:30.57    BP:122/74  HR:78bpm  TEMP: ( )  RESP:96 %   PHYSICAL EXAM: Constitutional: WDWN, Non-toxic appearing. Psychiatric: Alert & appropriately interactive.  Not depressed or anxious appearing. Respiratory: No increased work of breathing.  Trachea  Midline Eyes: Pupils are equal.  EOM intact without nystagmus.  No scleral icterus  Vascular Exam: warm to touch no edema  lower extremity neuro exam: unremarkable normal strength normal sensation  MSK Exam: Bilateral heel pain with insertional swelling of the heel bone consistent with Haglund's deformity in the setting of underlying thickening and Tendinopathic changes as previously documented on MSK ultrasound of the Achilles.  Additionally she has pain with direct palpation of the origin of the plantar fascia and has overall remarkably good dorsiflexion given the severity of her pain.   ASSESSMENT & PLAN:   1. Neuritis of foot, unspecified laterality   2. Haglund's deformity of right heel   3. Achilles tendinosis   4. Heel pain, bilateral   5. Plantar fasciitis, bilateral   6. Avitaminosis D   7. Heel pain, unspecified laterality     PLAN: She has chronic ongoing lower extremity Tendinopathic changes that are insertional in nature.  She has responded well to supplemental vitamin D and has a low likelihood of this being an inflammatory arthropathy such as ankylosing spondylitis but could be considered if any other systemic presenting symptoms.  Lab work in the past has been reassuring.  I would like for her to continue with home therapeutic exercises as previously reviewed and we can consider Baxters neuralgia as a source of her continued pain which she is Artie on gabapentin and this could be further titrated.  The lack of improvement can consider injections as well but would like to defer these at this time given the increased risk of rupture.  Follow-up: Return in about 3 months (around 12/29/2017).      Please see additional documentation for Objective, Assessment and Plan sections. Pertinent additional documentation may be included in corresponding procedure notes, imaging studies, problem based documentation and patient instructions. Please see these sections of the encounter  for additional information regarding this visit.  CMA/ATC served as Education administrator during this visit. History, Physical, and Plan performed by medical provider. Documentation and orders reviewed and attested to.      Gerda Diss, [facility] Physician

## 2017-10-16 ENCOUNTER — Other Ambulatory Visit: Payer: Self-pay | Admitting: Sports Medicine

## 2017-10-16 NOTE — Telephone Encounter (Signed)
**Note De-identified Stotler Obfuscation** Called pt, no answer no VM.

## 2017-10-19 ENCOUNTER — Telehealth: Payer: Self-pay | Admitting: Sports Medicine

## 2017-10-19 NOTE — Telephone Encounter (Signed)
**Note De-Identified Venhuizen Obfuscation** Copied from [facility] #[id number]. Topic: Quick Communication - Office Called Patient >> Oct 19, 2017  8:33 AM Jasper Loser, CMA wrote: Reason for CRM: Called and left message with husband to have pt call the office. Need to confirm current Gabapentin dosing (100 mg vs 300 mg and how often she is taking).   >> Oct 19, 2017  8:34 AM Jasper Loser, CMA wrote: Called and left message with husband to have pt call the office. Need to confirm current Gabapentin dosing (100 mg vs 300 mg and how often she is taking).  >> Oct 19, 2017  8:53 AM Bea Graff, NT wrote: Pt calling back and states that Dr. Paulla Fore told her to take the Gabapentin as follows:  On the days she is off from work she takes the 300mg  throughout the day and at bedtime.   On the days she works she takes the 100mg  throughout the day and then the 300mg  at bedtime.  Patient states she already had the 100mg  refilled and is only needing the 300mg  refilled.  CB#: [phone]

## 2017-10-19 NOTE — Telephone Encounter (Signed)
**Note De-identified Grayson Obfuscation** See note

## 2017-10-19 NOTE — Telephone Encounter (Signed)
**Note De-Identified Ahlers Obfuscation** Called and left message with husband to have pt call the office. Need to confirm current Gabapentin dosing (100 mg vs 300 mg and how often she is taking).

## 2017-10-19 NOTE — Telephone Encounter (Signed)
**Note De-Identified Minami Obfuscation** Spoke with patient, rx refilled.

## 2017-10-23 ENCOUNTER — Encounter: Payer: Self-pay | Admitting: Sports Medicine

## 2017-12-08 ENCOUNTER — Encounter: Payer: Self-pay | Admitting: *Deleted

## 2017-12-21 ENCOUNTER — Ambulatory Visit: Payer: BLUE CROSS/BLUE SHIELD | Admitting: Family Medicine

## 2017-12-22 ENCOUNTER — Ambulatory Visit: Payer: BLUE CROSS/BLUE SHIELD | Admitting: Family Medicine

## 2017-12-22 ENCOUNTER — Encounter: Payer: Self-pay | Admitting: Family Medicine

## 2017-12-22 VITALS — BP 135/89 | HR 106 | Temp 97.2°F | Ht 64.0 in | Wt 181.8 lb

## 2017-12-22 DIAGNOSIS — G5790 Unspecified mononeuropathy of unspecified lower limb: Secondary | ICD-10-CM | POA: Diagnosis not present

## 2017-12-22 DIAGNOSIS — L989 Disorder of the skin and subcutaneous tissue, unspecified: Secondary | ICD-10-CM

## 2017-12-22 DIAGNOSIS — F419 Anxiety disorder, unspecified: Secondary | ICD-10-CM | POA: Diagnosis not present

## 2017-12-22 MED ORDER — SERTRALINE HCL 100 MG PO TABS: 100.0000 mg | ORAL_TABLET | Freq: Every day | ORAL | 3 refills | Status: DC

## 2017-12-22 MED ORDER — GABAPENTIN 300 MG PO CAPS: ORAL_CAPSULE | ORAL | 2 refills | Status: DC

## 2017-12-22 MED ORDER — ESTROGENS CONJUGATED 0.625 MG PO TABS: 0.6250 mg | ORAL_TABLET | Freq: Every day | ORAL | 1 refills | Status: DC

## 2017-12-22 MED ORDER — GABAPENTIN 100 MG PO CAPS: ORAL_CAPSULE | ORAL | 2 refills | Status: DC

## 2017-12-22 MED ORDER — DICYCLOMINE HCL 20 MG PO TABS: 20.0000 mg | ORAL_TABLET | Freq: Four times a day (QID) | ORAL | 5 refills | Status: DC

## 2017-12-22 MED ORDER — CYCLOBENZAPRINE HCL 5 MG PO TABS: 5.0000 mg | ORAL_TABLET | Freq: Three times a day (TID) | ORAL | 5 refills | Status: DC | PRN

## 2017-12-22 NOTE — Patient Instructions (Addendum)
**Note De-Identified Nepomuceno Obfuscation** Great to see you!  Come back in 6 months to see Dr. Lajuana Ripple

## 2017-12-22 NOTE — Progress Notes (Signed)
**Note De-identified Mcartor Obfuscation**   **Note De-Identified Hilgeman Obfuscation** HPI  Patient presents today here for follow up chronic medical problems.   Neuritis of the foot Patient doing well with gabapentin, uses that after flares with good improvement.  Anxiety Doing well with Zoloft, needs refill.  Skin lesion Right medial arm, seems to be getting worse and itches at times.   PMH: Smoking status noted ROS: Per HPI  Objective: BP 135/89   Pulse (!) 106   Temp (!) 97.2 F (36.2 C) (Oral)   Ht 5\' 4"  (1.626 m)   Wt 181 lb 12.8 oz (82.5 kg)   BMI 31.21 kg/m  Gen: NAD, alert, cooperative with exam HEENT: NCAT, EOMI, PERRL CV: RRR, good S1/S2, no murmur Resp: CTABL, no wheezes, non-labored Abd: SNTND, BS present, no guarding or organomegaly Ext: No edema, warm Neuro: Alert and oriented, No gross deficits  R medial arm skin lesion- 4 X 2 mm, slightly raised erythemetous/dark brown smooth lesion  Assessment and plan:  # Skin lesion Appears low risk but changing- Derm referral*  # Anxiety Doing well, no chnag eto zoloft  # neuritis of the foot Improved, refill gababpentin Seeing sports med in a few weeks- appreciate Dr. Nicolasa Ducking management.     Orders Placed This Encounter  Procedures  . Ambulatory referral to Dermatology    Referral Priority:   Routine    Referral Type:   Consultation    Referral Reason:   Specialty Services Required    Requested Specialty:   Dermatology    Number of Visits Requested:   1    Meds ordered this encounter  Medications  . cyclobenzaprine (FLEXERIL) 5 MG tablet    Sig: Take 1 tablet (5 mg total) by mouth 3 (three) times daily as needed for muscle spasms.    Dispense:  60 tablet    Refill:  5  . dicyclomine (BENTYL) 20 MG tablet    Sig: Take 1 tablet (20 mg total) by mouth every 6 (six) hours.    Dispense:  120 tablet    Refill:  5  . estrogens, conjugated, (PREMARIN) 0.625 MG tablet    Sig: Take 1 tablet (0.625 mg total) by mouth daily.    Dispense:  90 tablet    Refill:  1  . gabapentin  (NEURONTIN) 100 MG capsule    Sig: Take 2-3 capsules (200-300 mg total) by mouth at bedtime. May also take 1 capsule (100 mg total) 3 (three) times daily as needed.    Dispense:  120 capsule    Refill:  2  . gabapentin (NEURONTIN) 300 MG capsule    Sig: Take 1 capsule, orally, up to TID or as directed    Dispense:  90 capsule    Refill:  2  . sertraline (ZOLOFT) 100 MG tablet    Sig: Take 1 tablet (100 mg total) by mouth daily.    Dispense:  90 tablet    Refill:  [place], MD [facility] 12/22/2017, 1:52 PM

## 2017-12-30 ENCOUNTER — Ambulatory Visit: Payer: BLUE CROSS/BLUE SHIELD | Admitting: Sports Medicine

## 2018-01-06 ENCOUNTER — Other Ambulatory Visit: Payer: Self-pay | Admitting: Family Medicine

## 2018-01-11 ENCOUNTER — Other Ambulatory Visit: Payer: Self-pay | Admitting: Family Medicine

## 2018-02-01 ENCOUNTER — Ambulatory Visit: Payer: BLUE CROSS/BLUE SHIELD | Admitting: Sports Medicine

## 2018-02-01 ENCOUNTER — Encounter: Payer: Self-pay | Admitting: Sports Medicine

## 2018-02-01 ENCOUNTER — Ambulatory Visit: Payer: Self-pay

## 2018-02-01 VITALS — BP 130/86 | HR 87 | Ht 64.0 in | Wt 181.6 lb

## 2018-02-01 DIAGNOSIS — M79671 Pain in right foot: Secondary | ICD-10-CM

## 2018-02-01 DIAGNOSIS — M7660 Achilles tendinitis, unspecified leg: Secondary | ICD-10-CM

## 2018-02-01 DIAGNOSIS — E559 Vitamin D deficiency, unspecified: Secondary | ICD-10-CM

## 2018-02-01 DIAGNOSIS — M79672 Pain in left foot: Secondary | ICD-10-CM

## 2018-02-01 DIAGNOSIS — M6788 Other specified disorders of synovium and tendon, other site: Secondary | ICD-10-CM

## 2018-02-01 MED ORDER — CHOLECALCIFEROL 1.25 MG (50000 UT) PO TABS: 1.0000 | ORAL_TABLET | ORAL | 1 refills | Status: DC

## 2018-02-01 NOTE — Procedures (Signed)
**Note De-Identified Mullan Obfuscation** PROCEDURE NOTE:  Ultrasound Guided: Injection: Left Achilles, retrocalcaneal Images were obtained and interpreted by myself, Teresa Coombs, DO  Images have been saved and stored to PACS system. Images obtained on: GE S7 Ultrasound machine    ULTRASOUND FINDINGS:  Fragmentation of the insertion of the Achilles with mild halo sign around the peritenon directly at this area.  Generalized synovitis.  No significant neovascularity.  Calcific changes within the distal Achilles.  Care was taken to avoid intratendinous injection.  DESCRIPTION OF PROCEDURE:  The patient's clinical condition is marked by substantial pain and/or significant functional disability. Other conservative therapy has not provided relief, is contraindicated, or not appropriate. There is a reasonable likelihood that injection will significantly improve the patient's pain and/or functional impairment.   After discussing the risks, benefits and expected outcomes of the injection and all questions were reviewed and answered, the patient wished to undergo the above named procedure.  Verbal consent was obtained.  The ultrasound was used to identify the target structure and adjacent neurovascular structures. The skin was then prepped in sterile fashion and the target structure was injected under direct visualization using sterile technique as below:  Single injection performed as below: PREP: Alcohol and Ethel Chloride APPROACH:direct, single injection, 22g 1.5 in. INJECTATE: 1 cc 0.5% Marcaine and 1 cc 40mg /mL DepoMedrol ASPIRATE: None DRESSING: Band-Aid  Post procedural instructions including recommending icing and warning signs for infection were reviewed.    This procedure was well tolerated and there were no complications.   IMPRESSION: Succesful Ultrasound Guided: Injection

## 2018-02-01 NOTE — Patient Instructions (Signed)
**Note De-identified Barreiro Obfuscation** You had an injection today.  Things to be aware of after injection are listed below: . You may experience no significant improvement or even a slight worsening in your symptoms during the first 24 to 48 hours.  After that we expect your symptoms to improve gradually over the next 2 weeks for the medicine to have its maximal effect.  You should continue to have improvement out to 6 weeks after your injection. . Dr. Rigby recommends icing the site of the injection for 20 minutes  1-2 times the day of your injection . You may shower but no swimming, tub bath or Jacuzzi for 24 hours. . If your bandage falls off this does not need to be replaced.  It is appropriate to remove the bandage after 4 hours. . You may resume light activities as tolerated unless otherwise directed per Dr. Rigby during your visit  POSSIBLE STEROID SIDE EFFECTS:  Side effects from injectable steroids tend to be less than when taken orally however you may experience some of the symptoms listed below.  If experienced these should only last for a short period of time. Change in menstrual flow  Edema (swelling)  Increased appetite Skin flushing (redness)  Skin rash/acne  Thrush (oral) Yeast vaginitis    Increased sweating  Depression Increased blood glucose levels Cramping and leg/calf  Euphoria (feeling happy)  POSSIBLE PROCEDURE SIDE EFFECTS: The side effects of the injection are usually fairly minimal however if you may experience some of the following side effects that are usually self-limited and will is off on their own.  If you are concerned please feel free to call the office with questions:  Increased numbness or tingling  Nausea or vomiting  Swelling or bruising at the injection site   Please call our office if if you experience any of the following symptoms over the next week as these can be signs of infection:   Fever greater than 100.5F  Significant swelling at the injection site  Significant redness or drainage  from the injection site  If after 2 weeks you are continuing to have worsening symptoms please call our office to discuss what the next appropriate actions should be including the potential for a return office visit or other diagnostic testing.    

## 2018-02-01 NOTE — Progress Notes (Signed)
**Note De-Identified Finkel Obfuscation** Felicia Schultz. Rigby, [place] at [facility] [phone]  Felicia Schultz - 67 y.o. female MRN [id number]  Date of birth: [birth year]  Visit Date: 02/01/2018  PCP: Felicia Euler, MD   Referred by: Felicia Euler, MD  Scribe(s) for today's visit: Felicia Schultz, LAT, ATC  SUBJECTIVE:  Felicia Schultz is here for Follow-up (L foot/heel pain) .    Her bilateral heel pain symptoms INITIALLY: Began about 1.5 weeks ago and has been progressively getting worse. She denies any abnormal activity that would be causing the pain.  Described as severe pain like walking on cut glass. When she isn't on her feet she can feel both feet throbbing. Pain radiates from the heels to the toes, mostly on the bottom of her feet. Pain will sometimes cause cramping in her feet at night.  Worsened with walking, unbearable when walking without shoes.  Improved with rest, wearing padded shoes.  Additional associated symptoms include: Pt reports increased lower back pain since her heel sx started. She has experienced bilateral hip pain as well. She denies pain in either leg. She has experienced similar pain years ago after wearing a different pair of shoes but sx resolved after about 2 days.      07/10/17: Compared to the last office visit, her previously described symptoms are improving, it no longer feels like she is walking on glass, now it feels like she just has bruising on the feet. Pain gets worse throughout the day and is severe at night.  Current symptoms are moderate & are nonradiating She has been taking IBU and using Pennsaid PRN.  She was started on Gabapentin at last visit. She is tolerating medication OK, only taking BID when working but TID on days off. Medication does make her slightly groggy sometimes.  Longitudinal metatarsal arch supports were added to shoes. Pt reports that arch supports are "OK".  Advised to work on [employer] exercises along with  additional exercises that were discussed at last visit. She is not having any trouble with exercises but also doesn't find them to be very helpful.  She reports occasional "twinge" of pain in lower back. She notes that chairs at work are very uncomfortable.   09/29/17: Compared to the last office visit on 07/10/17, her previously described  B heel and Achille's symptoms are improving.  She notes marked improvement in her R heel w/ little to no pain at this point.  She reports approximately 90% improvement in her L heel. Current symptoms are moderate & are nonradiating She has been taking IBU and Gabapentin (100 mg and 300 mg 2-3x/day depending on her work schedule).  Pt con't to perform her Alfredson's exercises.  02/01/2018: Compared to the last office visit on 09/29/17, her previously described L foot/heel pain symptoms are worsening w/ increased pain Current symptoms are moderate & are nonradiating.  Aggravated w/ walking, particularly in the morning and w/ pressure to touch. She has been taking Gabapentin.  She also takes Tylenol prn.   She's been doing the Alfredson's exercises intermittently.   REVIEW OF SYSTEMS: Denies night time disturbances. Denies fevers, chills, or night sweats. Denies unexplained weight loss. Reports personal history of cancer.  Basal cell carcinoma on nose - 1980s Denies changes in bowel or bladder habits. Denies recent unreported falls. Denies new or worsening dyspnea or wheezing. Denies headaches or dizziness.  Denies numbness, tingling or weakness  In the extremities.  Denies dizziness or presyncopal episodes Denies **Note De-Identified Willemsen Obfuscation** lower extremity edema    HISTORY & PERTINENT PRIOR DATA:  Prior History reviewed and updated per electronic medical record.  Significant/pertinent history, findings, studies include:  reports that she has never smoked. She has never used smokeless tobacco. No results for input(s): HGBA1C, LABURIC, CREATINE in the last 8760 hours. No  specialty comments available. No problems updated.  OBJECTIVE:  VS:  HT:5\' 4"  (162.6 cm)   WT:181 lb 9.6 oz (82.4 kg)  BMI:31.16    BP:130/86  HR:87bpm  TEMP: ( )  RESP:95 %   PHYSICAL EXAM: CONSTITUTIONAL: Well-developed, Well-nourished and In no acute distress Alert & appropriately interactive. and Not depressed or anxious appearing. Respiratory: No increased work of breathing.  Trachea Midline EYES: Pupils are equal., EOM intact without nystagmus. and No scleral icterus.  Lower extremities: Warm and well perfused NEURO: unremarkable  MSK Exam: Foot and ankle:: . Well aligned, no significant deformity. . TTP over Distal Achilles and calcaneus . Non tender over Avascular zone of the Achilles tendon. . Normal, non-painful Inversion, eversion and dorsiflexion.  Mild pain with plantar flexion. . Ligamentously stable to Anterior drawer and talar tilting.   PROCEDURES & DATA REVIEWED:  Retrocalcaneal bursa injection performed today.  ASSESSMENT   1. Heel pain, bilateral   2. Achilles tendinosis   3. Avitaminosis D     PLAN:  Retrocalcaneal bursa injection performed today after discussing the risks and benefits.  She has been on chronic long-standing high dose twice weekly vitamin D with marginally well-maintained levels at this.  We will plan to recheck her blood work in 6 weeks and she follows up after resuming supplementation.  Meds ordered this encounter  Medications  . Cholecalciferol 50000 units TABS    Sig: Take 1 tablet by mouth 2 (two) times a week.    Dispense:  24 tablet    Refill:  1    Do not substitute for ergocalciferol     Follow-up: Return in about 6 weeks (around 03/15/2018) for repeat clinical exam.      Please see additional documentation for Objective, Assessment and Plan sections. Pertinent additional documentation may be included in corresponding procedure notes, imaging studies, problem based documentation and patient instructions. Please  see these sections of the encounter for additional information regarding this visit.  CMA/ATC served as Education administrator during this visit. History, Physical, and Plan performed by medical provider. Documentation and orders reviewed and attested to.      Gerda Diss, [place] [facility] Physician

## 2018-02-10 ENCOUNTER — Ambulatory Visit: Payer: BLUE CROSS/BLUE SHIELD | Admitting: Family Medicine

## 2018-02-10 ENCOUNTER — Encounter: Payer: Self-pay | Admitting: Family Medicine

## 2018-02-10 VITALS — BP 132/78 | HR 57 | Temp 98.6°F | Ht 64.0 in | Wt 180.6 lb

## 2018-02-10 DIAGNOSIS — J309 Allergic rhinitis, unspecified: Secondary | ICD-10-CM | POA: Diagnosis not present

## 2018-02-10 DIAGNOSIS — Z8639 Personal history of other endocrine, nutritional and metabolic disease: Secondary | ICD-10-CM | POA: Diagnosis not present

## 2018-02-10 DIAGNOSIS — I499 Cardiac arrhythmia, unspecified: Secondary | ICD-10-CM

## 2018-02-10 DIAGNOSIS — Z114 Encounter for screening for human immunodeficiency virus [HIV]: Secondary | ICD-10-CM | POA: Diagnosis not present

## 2018-02-10 DIAGNOSIS — E559 Vitamin D deficiency, unspecified: Secondary | ICD-10-CM

## 2018-02-10 DIAGNOSIS — R5381 Other malaise: Secondary | ICD-10-CM

## 2018-02-10 DIAGNOSIS — Z1159 Encounter for screening for other viral diseases: Secondary | ICD-10-CM | POA: Diagnosis not present

## 2018-02-10 DIAGNOSIS — N959 Unspecified menopausal and perimenopausal disorder: Secondary | ICD-10-CM

## 2018-02-10 DIAGNOSIS — K529 Noninfective gastroenteritis and colitis, unspecified: Secondary | ICD-10-CM | POA: Insufficient documentation

## 2018-02-10 LAB — COMPREHENSIVE METABOLIC PANEL
ALT: 13 U/L (ref 0–35)
AST: 15 U/L (ref 0–37)
Albumin: 4.3 g/dL (ref 3.5–5.2)
Alkaline Phosphatase: 93 U/L (ref 39–117)
BUN: 19 mg/dL (ref 6–23)
CO2: 26 mEq/L (ref 19–32)
Calcium: 9.2 mg/dL (ref 8.4–10.5)
Chloride: 106 mEq/L (ref 96–112)
Creatinine, Ser: 0.65 mg/dL (ref 0.40–1.20)
GFR: 97.27 mL/min (ref >60.00)
Glucose, Bld: 95 mg/dL (ref 70–99)
Potassium: 3.9 mEq/L (ref 3.5–5.1)
Sodium: 140 mEq/L (ref 135–145)
Total Bilirubin: 0.8 mg/dL (ref 0.2–1.2)
Total Protein: 6.8 g/dL (ref 6.0–8.3)

## 2018-02-10 LAB — CBC
HCT: 39.8 % (ref 36.0–46.0)
Hemoglobin: 13.1 g/dL (ref 12.0–15.0)
MCHC: 32.8 g/dL (ref 30.0–36.0)
MCV: 95.3 fl (ref 78.0–100.0)
Platelets: 198 K/uL (ref 150.0–400.0)
RBC: 4.18 Mil/uL (ref 3.87–5.11)
RDW: 14.1 % (ref 11.5–15.5)
WBC: 4.5 K/uL (ref 4.0–10.5)

## 2018-02-10 LAB — TSH: TSH: 1.39 u[IU]/mL (ref 0.35–4.50)

## 2018-02-10 LAB — HEMOGLOBIN A1C: Hgb A1c MFr Bld: 5.4 % (ref 4.6–6.5)

## 2018-02-10 NOTE — Assessment & Plan Note (Signed)
**Note De-Identified Imber Obfuscation** Continue vitamin D replacement. Check vit d level on next blood draw.

## 2018-02-10 NOTE — Assessment & Plan Note (Signed)
**Note De-Identified Negash Obfuscation** Stable on astelin.

## 2018-02-10 NOTE — Progress Notes (Signed)
**Note De-Identified Biebel Obfuscation** Subjective:  Felicia Schultz is a 67 y.o. female who presents today with a chief complaint of malaise and to establish care.   HPI:  Malaise, new problem Symptoms started 3-4 weeks. She has been under increased stress at work due to having a new boss and thinks that may be contributing. Just feels "bad." She has not noticed anything that makes her symptoms better or worse. Feels a "sense of foreboding." Occasional abdominal fluttering sensation. Has increased fatigue. Associated with a headache that hurts "all over." She has had some recent increased rhinorrhea, which is a chronic problem for her. She has taken tylenol which has not significantly helped. No obvious aggravating or alleviating factors. No numbness or tingling.    She has several chronic, stable conditions outlined below:  Elevated Blood Pressure Reading Previously on atenolol. Not currently on any medications.   Allergic Rhinitis Takes astelin spray as needed.   Low vitamin D Currently on vitamin D 5000 units twice weekly.   Frequent Stools Takes bentyl 20mg  as needed. Has about 6 bowel movements per days. She has not seen GI for a few years. Occasionally has perianal irritation.   Depression/Anxiety Has not been on zoloft for about 6 months.   Postmenopausal Symptoms Takes premarin 0.625mg  as needed for hot flashes. Has tried stopping in the past but could not tolerate.   ROS: Per HPI, otherwise a complete review of systems was negative.   PMH:  The following were reviewed and entered/updated in epic: Past Medical History:  Diagnosis Date  . Allergy   . Atrial flutter ([place])    typical appearing  . Cancer ([place])    basal ca - nose - 1985  . Diabetes mellitus    type 2-no longer since gastric in 2005  . GERD (gastroesophageal reflux disease)   . Hypertension   . Hypokalemia   . Osteoporosis 2000  . Paroxysmal atrial fibrillation ([facility])   . Personal history of colonic polyps - sessile serrated 11/23/2013    Patient Active Problem List   Diagnosis Date Noted  . Spondylosis of lumbar joint 05/28/2017  . Plantar fasciitis, bilateral 05/28/2017  . Chronic pain of right wrist 05/28/2017  . Haglund's deformity of right heel 02/02/2017  . Postmenopausal symptoms 06/02/2016  . History of diabetes mellitus 11/13/2015  . Atrial flutter, paroxysmal ([place]) 09/05/2014  . Anxiety 07/19/2014  . Arthritis of neck 07/19/2014  . Acid reflux 07/19/2014  . Adaptive colitis 07/19/2014  . Nasal cavity polyp 07/19/2014  . Bariatric surgery status 07/19/2014  . Avitaminosis D 07/19/2014  . Vernal limbic conjunctivitis of both eyes 12/18/2013  . Atrial fibrillation ([place]) 11/26/2013  . Personal history of colonic polyps - sessile serrated 11/23/2013  . Allergic rhinitis 11/11/2011  . UNSPECIFIED HYPERTROPHIC&ATROPHIC CONDITION SKIN 06/04/2010  . OSTEOPENIA 06/04/2010  . NECK PAIN 03/18/2010  . ALLERGIC URTICARIA 03/27/2009  . B12 DEFICIENCY 05/28/2007  . DEPRESSION 03/08/2007  . Essential hypertension 03/08/2007   Past Surgical History:  Procedure Laterality Date  . APPENDECTOMY    . ATRIAL FLUTTER ABLATION N/A 09/05/2014   Procedure: ATRIAL FLUTTER ABLATION;  Surgeon: Thompson Grayer, MD;  Location: [facility] CATH LAB;  Service: Cardiovascular;  Laterality: N/A;  . CARPAL TUNNEL RELEASE Bilateral   . FOOT SURGERY Bilateral 2012   shorten bones in both feet  . GASTRIC BYPASS    . removal of ovary Right 1987  . SPINE SURGERY  2000/2012  . TOTAL ABDOMINAL HYSTERECTOMY  1999  . WRIST SURGERY Right 2018 **Note De-Identified Czajka Obfuscation** Dr. Apolonio Schneiders -ganglion excision    Family History  Problem Relation Age of Onset  . Diabetes Mother   . Heart disease Mother   . Arthritis Mother   . Hearing loss Mother   . Hyperlipidemia Mother   . Hypertension Mother   . Kidney disease Mother   . Learning disabilities Mother   . Miscarriages / [place] Mother   . Heart disease Father   . Hypertension Father   . Alcohol abuse Father   .  Hyperlipidemia Father   . Emphysema Maternal Grandmother   . Cancer Maternal Grandmother        throat and lung(smoker)  . Heart disease Brother   . Cancer Brother        lung  . Colon cancer Maternal Grandfather   . Cancer Maternal Grandfather        colon  . Scleroderma Sister   . Early death Sister        schlederma  . Miscarriages / Stillbirths Sister   . Stroke Brother   . Emphysema Sister   . Diabetes Sister   . Hyperlipidemia Sister   . Hypertension Sister   . Mental illness Sister   . Miscarriages / Stillbirths Sister   . Stroke Sister   . Lung cancer Brother        smoker  . Diabetes Brother   . Diabetes Sister   . Pancreatic cancer Neg Hx   . Rectal cancer Neg Hx   . Stomach cancer Neg Hx     Medications- reviewed and updated Current Outpatient Medications  Medication Sig Dispense Refill  . azelastine (ASTELIN) 0.1 % nasal spray Place 1-2 sprays into both nostrils 2 (two) times daily. Use in each nostril as directed (Patient taking differently: Place 1-2 sprays into both nostrils as needed. Use in each nostril as directed ) 30 mL 11  . Cholecalciferol 50000 units TABS Take 1 tablet by mouth 2 (two) times a week. 24 tablet 1  . cyclobenzaprine (FLEXERIL) 5 MG tablet Take 1 tablet (5 mg total) by mouth 3 (three) times daily as needed for muscle spasms. 60 tablet 5  . dicyclomine (BENTYL) 20 MG tablet Take 1 tablet (20 mg total) by mouth every 6 (six) hours. (Patient taking differently: Take 20 mg by mouth as needed. ) 120 tablet 5  . estrogens, conjugated, (PREMARIN) 0.625 MG tablet Take 1 tablet (0.625 mg total) by mouth daily. 90 tablet 1  . gabapentin (NEURONTIN) 100 MG capsule Take 2-3 capsules (200-300 mg total) by mouth at bedtime. May also take 1 capsule (100 mg total) 3 (three) times daily as needed. 120 capsule 2  . gabapentin (NEURONTIN) 300 MG capsule Take 1 capsule, orally, up to TID or as directed 90 capsule 2  . sertraline (ZOLOFT) 100 MG tablet Take 1  tablet (100 mg total) by mouth daily. 90 tablet 3   No current facility-administered medications for this visit.     Allergies-reviewed and updated Allergies  Allergen Reactions  . Augmentin [Amoxicillin-Pot Clavulanate] Nausea Only    Severe nausea. Couldn't keep medication down   . Clarithromycin Hives  . Nitrofurantoin Other (See Comments) and Rash    Unknown Palms and hand itching, turned bright red per pt  . Sulfonamide Derivatives Other (See Comments)    Abdominal cramping, nausea  . Nitrofurantoin Macrocrystal Swelling  . Sulfa Antibiotics Nausea And Vomiting    Social History   Socioeconomic History  . Marital status: Married    Spouse name: Not **Note De-Identified Falzone Obfuscation** on file  . Number of children: 0  . Years of education: Not on file  . Highest education level: Not on file  Occupational History  . Occupation: Actuary: [employer]  . Financial resource strain: Not on file  . Food insecurity:    Worry: Not on file    Inability: Not on file  . Transportation needs:    Medical: Not on file    Non-medical: Not on file  Tobacco Use  . Smoking status: Never Smoker  . Smokeless tobacco: Never Used  Substance and Sexual Activity  . Alcohol use: No  . Drug use: No  . Sexual activity: Never    Birth control/protection: Abstinence  Lifestyle  . Physical activity:    Days per week: Not on file    Minutes per session: Not on file  . Stress: Not on file  Relationships  . Social connections:    Talks on phone: Not on file    Gets together: Not on file    Attends religious service: Not on file    Active member of club or organization: Not on file    Attends meetings of clubs or organizations: Not on file    Relationship status: Not on file  Other Topics Concern  . Not on file  Social History Narrative   Married, no children   Engineer, drilling for [employer].   Lives in [place].   1-2 caffeine/day    Objective:    Physical Exam: BP 132/78 (BP Location: Left Arm, Patient Position: Sitting, Cuff Size: Normal)   Pulse (!) 57   Temp 98.6 F (37 C) (Oral)   Ht 5\' 4"  (1.626 m)   Wt 180 lb 9.6 oz (81.9 kg)   SpO2 96%   BMI 31.00 kg/m   Gen: NAD, resting comfortably CV: Irregular with no murmurs appreciated Pulm: NWOB, CTAB with no crackles, wheezes, or rhonchi GI: Normal bowel sounds present. Soft, Nontender, Nondistended. MSK: No edema, cyanosis, or clubbing noted Skin: Warm, dry Neuro: CN 2-12 intact. Strength 5/5 in upper and lower extremities. Sensation to light touch intact throughout.  Psych: Normal affect and thought content  EKG: Sinus bradycardia. No ischemic changes.   Assessment/Plan:  Malaise / Stress Unclear etiology. Possibly related to underlying increased stress at work. Check CBC, CMET, and TSH to rule out other causes. Her exam today is normal and she has no signs of systemic illness.  Recommended patient restart her sertraline as she was taking before.  Discussed reasons return to care.  Follow-up in 4 to 6 weeks.  Irregular heart rate Likely PVCs/PACs.  EKG today was within normal limits.  Allergic rhinitis Stable on astelin.   Avitaminosis D Continue vitamin D replacement. Check vit d level on next blood draw.   Frequent stools Unclear underlying etiology. Very possibly could be IBS. Continue bentyl as needed.   Postmenopausal symptoms Discussed risks associated with chronic estrogen use including increased risk of heart attack, stroke, and VTE, in addition to increased risks of cancer. Patient voiced understanding. She reports only taking premarin a few times a month as needed for hot flashes.   Preventive health care Check A1c to screen for diabetes.  Check hepatitis C antibody and HIV antibody.  Algis Greenhouse. Jerline Pain, MD 02/10/2018 9:19 AM

## 2018-02-10 NOTE — Patient Instructions (Signed)
**Note De-Identified Monical Obfuscation** It was very nice to see you today!  Your EKG today is normal. Your physical exam is normal.  We will check blood work today to make sure there is nothing else going on.  I think a lot of your symptoms are from increased stress. Please start the sertraline at your previous dose. Please come back to see Lattie Haw if you are interested.  Come back to see me in 6 months.  Come back sooner if your symptoms are not getting better.   Take care, Dr Jerline Pain

## 2018-02-10 NOTE — Assessment & Plan Note (Signed)
**Note De-Identified Henrichs Obfuscation** Discussed risks associated with chronic estrogen use including increased risk of heart attack, stroke, and VTE, in addition to increased risks of cancer. Patient voiced understanding. She reports only taking premarin a few times a month as needed for hot flashes.

## 2018-02-10 NOTE — Assessment & Plan Note (Signed)
**Note De-Identified Porras Obfuscation** Unclear underlying etiology. Very possibly could be IBS. Continue bentyl as needed.

## 2018-02-11 LAB — HIV ANTIBODY (ROUTINE TESTING W REFLEX): HIV 1&2 Ab, 4th Generation: NONREACTIVE

## 2018-02-11 LAB — HEPATITIS C ANTIBODY
Hepatitis C Ab: NONREACTIVE
SIGNAL TO CUT-OFF: 0.01 (ref <1.00)

## 2018-02-11 NOTE — Progress Notes (Signed)
**Note De-Identified Liuzzi Obfuscation** Please inform patient of the following:  All of her blood work is NORMAL. Her thryoid levels are normal. HIV and hepatitis C tests are negative. Blood sugar level is normal. Blood counts are normal.  Electrolytes, kidney function, liver function, and blood sugar levels are normal. Cholesterol levels are normal.  There are no clear causes for her symptoms based on these results. Would like for her to restart the sertraline as we discussed at her appointment and would like for her to come back in to see me in 4-6 weeks if her symptoms are not improving.   Algis Greenhouse. Jerline Pain, MD 02/11/2018 8:18 PM

## 2018-03-15 ENCOUNTER — Ambulatory Visit: Payer: BLUE CROSS/BLUE SHIELD | Admitting: Sports Medicine

## 2018-04-06 DIAGNOSIS — H1013 Acute atopic conjunctivitis, bilateral: Secondary | ICD-10-CM | POA: Diagnosis not present

## 2018-04-26 ENCOUNTER — Ambulatory Visit: Payer: BLUE CROSS/BLUE SHIELD | Admitting: Family Medicine

## 2018-04-30 ENCOUNTER — Ambulatory Visit (INDEPENDENT_AMBULATORY_CARE_PROVIDER_SITE_OTHER): Payer: BLUE CROSS/BLUE SHIELD

## 2018-04-30 ENCOUNTER — Ambulatory Visit: Payer: BLUE CROSS/BLUE SHIELD | Admitting: Sports Medicine

## 2018-04-30 ENCOUNTER — Encounter: Payer: Self-pay | Admitting: Sports Medicine

## 2018-04-30 VITALS — BP 120/80 | HR 83 | Ht 64.0 in | Wt 184.4 lb

## 2018-04-30 DIAGNOSIS — M6788 Other specified disorders of synovium and tendon, other site: Secondary | ICD-10-CM

## 2018-04-30 DIAGNOSIS — M79672 Pain in left foot: Secondary | ICD-10-CM | POA: Diagnosis not present

## 2018-04-30 DIAGNOSIS — M79671 Pain in right foot: Secondary | ICD-10-CM

## 2018-04-30 DIAGNOSIS — M7662 Achilles tendinitis, left leg: Secondary | ICD-10-CM | POA: Diagnosis not present

## 2018-04-30 DIAGNOSIS — M25572 Pain in left ankle and joints of left foot: Secondary | ICD-10-CM | POA: Diagnosis not present

## 2018-04-30 DIAGNOSIS — Z23 Encounter for immunization: Secondary | ICD-10-CM

## 2018-04-30 DIAGNOSIS — M19072 Primary osteoarthritis, left ankle and foot: Secondary | ICD-10-CM | POA: Diagnosis not present

## 2018-04-30 DIAGNOSIS — M7989 Other specified soft tissue disorders: Secondary | ICD-10-CM | POA: Diagnosis not present

## 2018-04-30 DIAGNOSIS — G5790 Unspecified mononeuropathy of unspecified lower limb: Secondary | ICD-10-CM

## 2018-04-30 NOTE — Progress Notes (Signed)
**Note De-Identified Mcnair Obfuscation** Felicia Bond. Le Schultz, [place] at [facility] [phone]  Felicia Schultz - 67 y.o. female MRN [id number]  Date of birth: March 08, 1951  Visit Date: 04/30/2018  PCP: Vivi Barrack, MD   Referred by: Vivi Barrack, MD  Scribe(s) for today's visit: Wendy Poet, LAT, ATC  SUBJECTIVE:  Felicia Schultz is here for Follow-up (L Achille's) .     Her bilateral heel pain symptoms INITIALLY: Began about 1.5 weeks ago and has been progressively getting worse. She denies any abnormal activity that would be causing the pain.  Described as severe pain like walking on cut glass. When she isn't on her feet she can feel both feet throbbing. Pain radiates from the heels to the toes, mostly on the bottom of her feet. Pain will sometimes cause cramping in her feet at night.  Worsened with walking, unbearable when walking without shoes.  Improved with rest, wearing padded shoes.  Additional associated symptoms include: Pt reports increased lower back pain since her heel sx started. She has experienced bilateral hip pain as well. She denies pain in either leg. She has experienced similar pain years ago after wearing a different pair of shoes but sx resolved after about 2 days.      07/10/17: Compared to the last office visit, her previously described symptoms are improving, it no longer feels like she is walking on glass, now it feels like she just has bruising on the feet. Pain gets worse throughout the day and is severe at night.  Current symptoms are moderate & are nonradiating She has been taking IBU and using Pennsaid PRN.  She was started on Gabapentin at last visit. She is tolerating medication OK, only taking BID when working but TID on days off. Medication does make her slightly groggy sometimes.  Longitudinal metatarsal arch supports were added to shoes. Pt reports that arch supports are "OK".  Advised to work on [employer] exercises along with additional  exercises that were discussed at last visit. She is not having any trouble with exercises but also doesn't find them to be very helpful.  She reports occasional "twinge" of pain in lower back. She notes that chairs at work are very uncomfortable.   09/29/17: Compared to the last office visit on 07/10/17, her previously described  B heel and Achille's symptoms are improving.  She notes marked improvement in her R heel w/ little to no pain at this point.  She reports approximately 90% improvement in her L heel. Current symptoms are moderate & are nonradiating She has been taking IBU and Gabapentin (100 mg and 300 mg 2-3x/day depending on her work schedule).  Pt con't to perform her Alfredson's exercises.  02/01/2018: Compared to the last office visit on 09/29/17, her previously described L foot/heel pain symptoms are worsening w/ increased pain Current symptoms are moderate & are nonradiating.  Aggravated w/ walking, particularly in the morning and w/ pressure to touch. She has been taking Gabapentin.  She also takes Tylenol prn.   She's been doing the Alfredson's exercises intermittently.  04/30/2018: Compared to the last office visit on 02/01/18, her previously described L Achille's symptoms show no change.  She states the injection she had at her last visit helped for about 2-3 weeks but has not returned "full force."  She also reports a popping sensation in her L middle toe followed by a "jerking" sensation.  She additionally notes some recent swelling in her foot after being on **Note De-Identified Daversa Obfuscation** her feet for an extended period of time. Current symptoms are severe, sharp pain & are radiating to L foot and calf. She has been taking Gabapentin (100 mg and 300 mg 2-3x/day depending on her work schedule).  She is no longer doing the Alfredson's exercises due to the increase in her symptoms.  She had a steroid injection at her last visit which she feels helped for about 2-3 weeks.  REVIEW OF SYSTEMS: Denies night time  disturbances. Denies fevers, chills, or night sweats. Denies unexplained weight loss. Reports personal history of cancer.  Basal cell carcinoma on nose - 1980s Denies changes in bowel or bladder habits. Denies recent unreported falls. Denies new or worsening dyspnea or wheezing. Denies headaches or dizziness.  Denies numbness, tingling or weakness  In the extremities.  Denies dizziness or presyncopal episodes Denies lower extremity edema    HISTORY & PERTINENT PRIOR DATA:  Prior History reviewed and updated per electronic medical record.  Significant/pertinent history, findings, studies include:  reports that she has never smoked. She has never used smokeless tobacco. Recent Labs    02/10/18 1020  HGBA1C 5.4   No specialty comments available. Problem  Achilles Tendinosis     X-ray of the left foot and ankle: 04/30/2018: Distal Achilles tendon calcification with likely calcific tendinitis component.  Slight demineralization of the left foot with first MTP osteoarthritic change.  OBJECTIVE:  VS:  HT:5\' 4"  (162.6 cm)   WT:184 lb 6.4 oz (83.6 kg)  BMI:31.64    BP:120/80  HR:83bpm  TEMP: ( )  RESP:96 %   PHYSICAL EXAM: CONSTITUTIONAL: Well-developed, Well-nourished and In no acute distress Psychiatric: Alert & appropriately interactive. and Not depressed or anxious appearing. Respiratory: No increased work of breathing.  Trachea Midline EYES: Pupils are equal., EOM intact without nystagmus. and No scleral icterus.  Lower extremities: EXTREMITY EXAM: Warm and well perfused NEURO: unremarkable  MSK Exam: Foot and ankle:: . Well aligned, no significant deformity. . TTP over Distal Achilles and calcaneus . Non tender over  . Normal, non-painful Inversion, eversion and dorsiflexion.  Mild pain with plantar flexion. . Ligamentously stable to Anterior drawer and talar tilting.   ASSESSMENT   1. Left Achilles tendinitis   2. Left foot pain   3. Need for influenza  vaccination   4. Heel pain, bilateral   5. Achilles tendinosis   6. Neuritis of foot, unspecified laterality     PLAN:  Pertinent additional documentation may be included in corresponding procedure notes, imaging studies, problem based documentation and patient instructions.  Procedures:  . None  Medications:  No orders of the defined types were placed in this encounter.  Discussion/Instructions: Achilles tendinosis Calcific tendinitis of the right Achilles likely contributing some of this but she is also having some midfoot pain and forefoot pain.  Some ankle arthritis appreciated on x-rays today.  Will place her into a cam walker boot for the next 2 weeks and plan to follow-up thereafter this timeframe to reevaluate and consider MRI of the foot and/or ankle at that time.  .  Multifactorial symptoms.  Consider further evaluation of lumbar spine if any lack of improvement. . Discussed red flag symptoms that warrant earlier emergent evaluation and patient voices understanding. . Activity modifications and the importance of avoiding exacerbating activities (limiting pain to no more than a 4 / 10 during or following activity) recommended and discussed.  Follow-up:  . Return in about 2 weeks (around 05/14/2018) for repeat clinical exam. **Note De-Identified Tondreau Obfuscation** CMA/ATC served as Education administrator during this visit. History, Physical, and Plan performed by medical provider. Documentation and orders reviewed and attested to.      Gerda Diss, [facility] Physician

## 2018-04-30 NOTE — Assessment & Plan Note (Signed)
**Note De-Identified Perlstein Obfuscation** Calcific tendinitis of the right Achilles likely contributing some of this but she is also having some midfoot pain and forefoot pain.  Some ankle arthritis appreciated on x-rays today.  Will place her into a cam walker boot for the next 2 weeks and plan to follow-up thereafter this timeframe to reevaluate and consider MRI of the foot and/or ankle at that time.

## 2018-05-19 ENCOUNTER — Ambulatory Visit: Payer: BLUE CROSS/BLUE SHIELD | Admitting: Sports Medicine

## 2018-05-19 ENCOUNTER — Ambulatory Visit: Payer: Self-pay

## 2018-05-19 ENCOUNTER — Encounter: Payer: Self-pay | Admitting: Sports Medicine

## 2018-05-19 VITALS — BP 128/72 | HR 73 | Ht 64.0 in | Wt 185.6 lb

## 2018-05-19 DIAGNOSIS — M19072 Primary osteoarthritis, left ankle and foot: Secondary | ICD-10-CM

## 2018-05-19 DIAGNOSIS — M6788 Other specified disorders of synovium and tendon, other site: Secondary | ICD-10-CM

## 2018-05-19 NOTE — Progress Notes (Signed)
**Note De-Identified Steuart Obfuscation** Felicia Schultz, Felicia Schultz at [facility] [phone]  Felicia Schultz - 67 y.o. female MRN [id number]  Date of birth: [birth year]  Visit Date:   PCP: Vivi Barrack, MD   Referred by: Vivi Barrack, MD   SUBJECTIVE:  Felicia Schultz is here for f/u L achilles (Taking Gabapentin 100 & 300 mg bid-tid. Received steroid inj 03/04/18 and responded well. Put in boot at last OV, wearing while being active. Still having severe sharp pain radiating to both sides of the L foot. )   HPI: Follow-up for left foot and ankle pain that is been present for several months but worsened over the past several weeks.  She has been in a cam walker boot when active for the past 2 weeks with only minimal reported improvement.  She denies any significant services though.  Denies any mechanical symptoms.  REVIEW OF SYSTEMS: Denies night time disturbances. Denies fevers, chills, or night sweats. Denies unexplained weight loss. Reports personal history of cancer. Denies changes in bowel or bladder habits. Denies recent unreported falls. Denies new or worsening dyspnea or wheezing. Denies headaches or dizziness.  Denies numbness, tingling or weakness  In the extremities.  Denies dizziness or presyncopal episodes Denies lower extremity edema    HISTORY:  Prior history reviewed and updated per electronic medical record.  Social History   Occupational History  . 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 Smoking status: Never Smoker  . Smokeless tobacco: Never Used  Substance and Sexual Activity  . Alcohol use: No  . Drug use: No  . 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   1-2 caffeine/day      DATA OBTAINED & REVIEWED:   Recent Labs     06/18/17 0917 02/10/18 1020  HGBA1C  --  5.4  CALCIUM 9.2 9.2  AST 19 15  ALT 14 13  TSH  --  1.39   No problems updated. No specialty comments available.  OBJECTIVE:  VS:  HT:5\' 4"  (162.6 cm)   WT:185 lb 9.6 oz (84.2 kg)  BMI:31.84    BP:128/72  HR:73bpm  TEMP: ( )  RESP:98 %   PHYSICAL EXAM: CONSTITUTIONAL: Well-developed, Well-nourished and In no acute distress PSYCHIATRIC : Alert & appropriately interactive. and Not depressed or anxious appearing. RESPIRATORY : No increased work of breathing and Trachea Midline EYES : Pupils are equal., EOM intact without nystagmus. and No scleral icterus.  VASCULAR EXAM : Warm and well perfused NEURO: unremarkable  MSK Exam:  Left ankle, foot  Well aligned, no significant deformity. No overlying skin changes. Left foot and ankle generalized tenderness most focally over the posterior medial and posterior lateral ankle joint.  Small amount of pain over the anterior lateral joint.  The Achilles is only minimally tender which is significantly improved compared to last office visit. No swelling No effusion     ASSESSMENT  1. Achilles tendinosis   2. Primary osteoarthritis of left ankle     PLAN:  Pertinent additional documentation may be included in corresponding procedure notes, imaging studies, problem based documentation and patient instructions.  Procedures:  . None  Medications:  No orders of the defined types were placed in this encounter.  Discussion/Instructions: No problem-specific Assessment & **Note De-Identified Ripoll Obfuscation** Plan notes found for this encounter.  . Both diagnostic and therapeutic ankle intra-articular injection performed today.  Given the fact that she is continuing to have fairly significant symptoms that are more posterior in nature I would like to go ahead and set her up for an MRI as well.  She will plan to follow-up with [place] in 6 weeks if she is actually has good improvement with the injection otherwise she will follow-up after  the MRI is obtained. Body Helix Compression Sleeve provided today per AVS Discussed red flag symptoms that warrant earlier emergent evaluation and patient voices understanding. Activity modifications and the importance of avoiding exacerbating activities (limiting pain to no more than a 4 / 10 during or following activity) recommended and discussed.  Follow-up:  . Return in about 6 weeks (around 06/30/2018).          Gerda Diss, [place] [facility] Physician

## 2018-05-19 NOTE — Procedures (Signed)
**Note De-Identified Safranek Obfuscation** PROCEDURE NOTE:  Ultrasound Guided: Injection: Left ankle Images were obtained and interpreted by myself, Teresa Coombs, DO  Images have been saved and stored to PACS system. Images obtained on: GE S7 Ultrasound machine    ULTRASOUND FINDINGS:  Small amount of synovitis.  Mild degenerative changes.  DESCRIPTION OF PROCEDURE:  The patient's clinical condition is marked by substantial pain and/or significant functional disability. Other conservative therapy has not provided relief, is contraindicated, or not appropriate. There is a reasonable likelihood that injection will significantly improve the patient's pain and/or functional impairment.   After discussing the risks, benefits and expected outcomes of the injection and all questions were reviewed and answered, the patient wished to undergo the above named procedure.  Verbal consent was obtained.  The ultrasound was used to identify the target structure and adjacent neurovascular structures. The skin was then prepped in sterile fashion and the target structure was injected under direct visualization using sterile technique as below:  Single injection performed as below: PREP: Alcohol and Ethel Chloride APPROACH:anteriolateral, single injection, 25g 1.5 in. INJECTATE: 1 cc 0.5% Marcaine and 1 cc 40mg /mL DepoMedrol ASPIRATE: None DRESSING: Band-Aid  Post procedural instructions including recommending icing and warning signs for infection were reviewed.    This procedure was well tolerated and there were no complications.   IMPRESSION: Succesful Ultrasound Guided: Injection

## 2018-05-19 NOTE — Patient Instructions (Signed)
**Note De-identified Macfarlane Obfuscation** You had an injection today.  Things to be aware of after injection are listed below: . You may experience no significant improvement or even a slight worsening in your symptoms during the first 24 to 48 hours.  After that we expect your symptoms to improve gradually over the next 2 weeks for the medicine to have its maximal effect.  You should continue to have improvement out to 6 weeks after your injection. . Dr. Rigby recommends icing the site of the injection for 20 minutes  1-2 times the day of your injection . You may shower but no swimming, tub bath or Jacuzzi for 24 hours. . If your bandage falls off this does not need to be replaced.  It is appropriate to remove the bandage after 4 hours. . You may resume light activities as tolerated unless otherwise directed per Dr. Rigby during your visit  POSSIBLE STEROID SIDE EFFECTS:  Side effects from injectable steroids tend to be less than when taken orally however you may experience some of the symptoms listed below.  If experienced these should only last for a short period of time. Change in menstrual flow  Edema (swelling)  Increased appetite Skin flushing (redness)  Skin rash/acne  Thrush (oral) Yeast vaginitis    Increased sweating  Depression Increased blood glucose levels Cramping and leg/calf  Euphoria (feeling happy)  POSSIBLE PROCEDURE SIDE EFFECTS: The side effects of the injection are usually fairly minimal however if you may experience some of the following side effects that are usually self-limited and will is off on their own.  If you are concerned please feel free to call the office with questions:  Increased numbness or tingling  Nausea or vomiting  Swelling or bruising at the injection site   Please call our office if if you experience any of the following symptoms over the next week as these can be signs of infection:   Fever greater than 100.5F  Significant swelling at the injection site  Significant redness or drainage  from the injection site  If after 2 weeks you are continuing to have worsening symptoms please call our office to discuss what the next appropriate actions should be including the potential for a return office visit or other diagnostic testing.    

## 2018-05-21 ENCOUNTER — Other Ambulatory Visit: Payer: Self-pay | Admitting: Family Medicine

## 2018-05-21 DIAGNOSIS — Z1231 Encounter for screening mammogram for malignant neoplasm of breast: Secondary | ICD-10-CM

## 2018-06-03 ENCOUNTER — Ambulatory Visit
Admission: RE | Admit: 2018-06-03 | Discharge: 2018-06-03 | Disposition: A | Discharge status: Home / Self Care | Payer: BLUE CROSS/BLUE SHIELD | Source: Ambulatory Visit | Attending: Sports Medicine | Admitting: Sports Medicine

## 2018-06-03 DIAGNOSIS — M25572 Pain in left ankle and joints of left foot: Secondary | ICD-10-CM | POA: Diagnosis not present

## 2018-06-03 DIAGNOSIS — M19072 Primary osteoarthritis, left ankle and foot: Secondary | ICD-10-CM

## 2018-06-03 DIAGNOSIS — M6788 Other specified disorders of synovium and tendon, other site: Secondary | ICD-10-CM

## 2018-06-10 NOTE — Progress Notes (Signed)
**Note De-Identified Shisler Obfuscation** The results are overall reassuring that there is nothing that appears to be surgical causing her pain.  She has a follow-up appointment in 3 weeks.  I am happy to go over this with her at that time or if she would like a sooner follow-up appointment at her convenience.

## 2018-06-14 ENCOUNTER — Other Ambulatory Visit: Payer: Self-pay | Admitting: Sports Medicine

## 2018-06-14 ENCOUNTER — Other Ambulatory Visit: Payer: Self-pay

## 2018-06-14 NOTE — Telephone Encounter (Signed)
**Note De-Identified Sudol Obfuscation** Needs vitamin D level, needs appointment.  She following here for primary care?  It looks like she is being seen elsewhere.  They have a different dose of vitamin D, 5000 IU twice weekly in their most recent note.

## 2018-06-14 NOTE — Telephone Encounter (Signed)
**Note De-Identified Sample Obfuscation** Last seen 12/22/17  Dr Wendi Snipes  Last Vit D 05/28/17  25.41

## 2018-06-25 ENCOUNTER — Ambulatory Visit: Payer: Self-pay

## 2018-06-25 ENCOUNTER — Encounter: Payer: Self-pay | Admitting: Sports Medicine

## 2018-06-25 ENCOUNTER — Ambulatory Visit: Payer: BLUE CROSS/BLUE SHIELD | Admitting: Sports Medicine

## 2018-06-25 VITALS — BP 130/80 | HR 81 | Ht 64.0 in | Wt 186.8 lb

## 2018-06-25 DIAGNOSIS — M722 Plantar fascial fibromatosis: Secondary | ICD-10-CM

## 2018-06-25 DIAGNOSIS — M6788 Other specified disorders of synovium and tendon, other site: Secondary | ICD-10-CM | POA: Diagnosis not present

## 2018-06-25 DIAGNOSIS — M79672 Pain in left foot: Secondary | ICD-10-CM | POA: Diagnosis not present

## 2018-06-25 NOTE — Progress Notes (Signed)
**Note De-Identified Aker Obfuscation** Felicia Schultz. Felicia Schultz, [place] at [facility] [phone]  Felicia Schultz - 68 y.o. female MRN [id number]  Date of birth: 04-15-1951  Visit Date: 06/25/2018   PCP: Vivi Barrack, MD   Referred by: Vivi Barrack, MD   SUBJECTIVE:  Chief Complaint  Patient presents with  . f/u L achilles    XR L ankle/foot 04/30/18. MRI L ankle 06/03/18. Wearing walking boot. Steroid inj 05/19/18.     HPI: Patient is here for worsening left foot pain once again.  She is returned to using the boot as of 4 days ago due to this worsening pain is on the plantar aspect of her foot.  Feels that she is stepping on glass to her heel.  There is increased warmth.  She had 3 days of relief following the ankle injection on 05/19/2018.  She subsequently underwent MRI is reviewed below.  REVIEW OF SYSTEMS: Per HPI  HISTORY:  Prior history reviewed and updated per electronic medical record.  Social History   Occupational History  . 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 Smoking status: Never Smoker  . Smokeless tobacco: Never Used  Substance and Sexual Activity  . Alcohol use: No  . Drug use: No  . 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   1-2 caffeine/day     DATA OBTAINED & REVIEWED:  Recent Labs    02/10/18 1020  HGBA1C 5.4  CALCIUM 9.2  AST 15  ALT 13  TSH 1.39   No problems updated. No specialty comments available.  OBJECTIVE:  VS:  HT:5\' 4"  (162.6 cm)   WT:186 lb 12.8 oz (84.7 kg)  BMI:32.05    BP:130/80  HR:81bpm  TEMP: ( )  RESP:97 %   PHYSICAL EXAM: Adult female.  No acute distress.  Alert appropriate.  Wearing a walking boot.  Her left foot and ankle are normal-appearing without significant swelling or ecchymosis.  There is  a slight fullness to the plantar aspect of her foot near the heel that is markedly tender with moderate pain with soft palpation over the medial calcaneus and marked pain with deeper palpation.  No pain with calcaneal squeeze test.  She does have a small amount of pain over the Achilles avascular zone and insertion.   ASSESSMENT  1. Left foot pain   2. Achilles tendinosis   3. Plantar fasciitis, bilateral     PLAN:  Pertinent additional documentation may be included in corresponding procedure notes, imaging studies, problem based documentation and patient instructions.  Procedures:  US Guided Injection per procedure note  Medications:  No orders of the defined types were placed in this encounter.   Discussion/Instructions:    No problem-specific Assessment & Plan notes found for this encounter.  Multifactorial foot and ankle pain.  Additional plantar fascia injection performed today given the fact that this seems to be the most symptomatic for her.  Ultimately she should respond well to this procedure if not can consider referral to orthopedics for plantar fascia and Achilles lengthening procedure.  We also discussed that custom cushioned insoles would likely provide her the most benefit now and moving forward and she will return for this at her convenience we will check on her progress at the **Note De-Identified Demeter Obfuscation** same time following this injection.  Continue with cool water soaking.  Return in about 2 weeks (around 07/09/2018) for for custom orthotics.            Gerda Diss, [place] [facility] Physician

## 2018-06-25 NOTE — Patient Instructions (Signed)
**Note De-Identified Glade Obfuscation** The cost of the pair of custom orthotics is $195.  You can look into having your insurance company cover the cost of these. Some insurance companies cover the cost and other do not.  If they do not you will be responsible for the full cost of the orthotics.  I am happy to do these for you at any time, you just need to let our front office schedulers know you would like an "orthotic appointment."  Please also make sure you bring athletic shoes with you on the day of your orthotic appointment or whatever shoes you plan to wear your orthotics in most frequently.   When you call your insurance company you will need to provide them the CPT code which is L3030 and there are 2 units.  You can call them  and ask if this is covered.

## 2018-06-25 NOTE — Procedures (Signed)
**Note De-Identified Kohan Obfuscation** PROCEDURE NOTE:  Ultrasound Guided: Injection: Left plantar fascia Images were obtained and interpreted by myself, Teresa Coombs, DO  Images have been saved and stored to PACS system. Images obtained on: GE S7 Ultrasound machine    ULTRASOUND FINDINGS:  pf 0.65cm  DESCRIPTION OF PROCEDURE:  The patient's clinical condition is marked by substantial pain and/or significant functional disability. Other conservative therapy has not provided relief, is contraindicated, or not appropriate. There is a reasonable likelihood that injection will significantly improve the patient's pain and/or functional impairment.   After discussing the risks, benefits and expected outcomes of the injection and all questions were reviewed and answered, the patient wished to undergo the above named procedure.  Verbal consent was obtained.  The ultrasound was used to identify the target structure and adjacent neurovascular structures. The skin was then prepped in sterile fashion and the target structure was injected under direct visualization using sterile technique as below:  Single injection performed as below: PREP: Alcohol and Ethel Chloride APPROACH:medial, single injection, 22g 1.5 in. INJECTATE: 1 cc 0.5% Marcaine and 1 cc 40mg /mL DepoMedrol ASPIRATE: None DRESSING: Band-Aid  Post procedural instructions including recommending icing and warning signs for infection were reviewed.    This procedure was well tolerated and there were no complications.   IMPRESSION: Succesful Ultrasound Guided: Injection

## 2018-06-26 ENCOUNTER — Encounter: Payer: Self-pay | Admitting: Sports Medicine

## 2018-06-28 ENCOUNTER — Ambulatory Visit: Payer: BLUE CROSS/BLUE SHIELD | Admitting: Sports Medicine

## 2018-07-09 ENCOUNTER — Encounter: Payer: Self-pay | Admitting: Sports Medicine

## 2018-07-09 ENCOUNTER — Ambulatory Visit (INDEPENDENT_AMBULATORY_CARE_PROVIDER_SITE_OTHER): Payer: BLUE CROSS/BLUE SHIELD | Admitting: Sports Medicine

## 2018-07-09 ENCOUNTER — Other Ambulatory Visit: Payer: Self-pay | Admitting: Physical Therapy

## 2018-07-09 DIAGNOSIS — M19072 Primary osteoarthritis, left ankle and foot: Secondary | ICD-10-CM

## 2018-07-09 DIAGNOSIS — M79672 Pain in left foot: Secondary | ICD-10-CM

## 2018-07-09 DIAGNOSIS — M722 Plantar fascial fibromatosis: Secondary | ICD-10-CM

## 2018-07-09 DIAGNOSIS — M6788 Other specified disorders of synovium and tendon, other site: Secondary | ICD-10-CM

## 2018-07-09 DIAGNOSIS — Z9884 Bariatric surgery status: Secondary | ICD-10-CM | POA: Insufficient documentation

## 2018-07-09 MED ORDER — GABAPENTIN 100 MG PO CAPS: ORAL_CAPSULE | ORAL | 2 refills | Status: DC

## 2018-07-09 MED ORDER — GABAPENTIN 300 MG PO CAPS: ORAL_CAPSULE | ORAL | 2 refills | Status: DC

## 2018-07-09 NOTE — Progress Notes (Signed)
**Note De-identified Olarte Obfuscation**  **Note De-Identified Wahid Obfuscation** [name]. Felicia Schultz, [place] at [facility] [phone]  Mireille Lacombe Bachmeier - 67 y.o. female MRN [id number]  Date of birth: July 20, 1950  Visit Date: 07/09/2018  PCP: Vivi Barrack, MD   Referred by: Vivi Barrack, MD  SUBJECTIVE:  Felicia Schultz is here for a procedure only visit for custom orthotic fabrication.  OBJECTIVE:  PHYSICAL EXAM: Please see previous exam notes for full evaluation of foot and gait exam. MSK Exam: Moderately high arch with generalized osteoarthritic bossing of the left ankle that is mild.  She has pain over the plantar fascia that is mild.  ASSESSMENT   1. Left foot pain   2. Achilles tendinosis   3. Plantar fasciitis, bilateral   4. Primary osteoarthritis of left ankle      PLAN & PROCEDURES:   . Custom orthotics fabricated today as below     PROCEDURE: CUSTOM ORTHOTIC FABRICATION Patient's underlying musculoskeletal conditions are directly related to poor biomechanics and will benefit from a functional custom orthotic.  There are no significant foot deformities that complicate the use of a custom orthotic.  The patient was fitted for a standard, cushioned, semi-rigid orthotic. The orthotic was heated & placed on the orthotic stand. The patient was positioned in subtalar neutral position and 10 of ankle dorsiflexion and weight bearing stance on the heated orthotic blank. After completion of the molding a base was applied to the orthotic blank. The orthotic was ground to a stable position for weightbearing. The patient ambulated in these and reported they were comfortable without pressure spots.              BLANK:  Size 6 - Standard Cushioned               BASE:  Blue EVA      POSTINGS:  none          Gerda Diss, [facility] Physician

## 2018-08-06 ENCOUNTER — Ambulatory Visit: Payer: BLUE CROSS/BLUE SHIELD | Admitting: Sports Medicine

## 2018-08-12 ENCOUNTER — Ambulatory Visit: Payer: BLUE CROSS/BLUE SHIELD | Admitting: Family Medicine

## 2018-08-12 ENCOUNTER — Encounter: Payer: Self-pay | Admitting: Family Medicine

## 2018-08-12 VITALS — BP 126/76 | HR 71 | Temp 98.9°F | Ht 64.0 in | Wt 183.0 lb

## 2018-08-12 DIAGNOSIS — N644 Mastodynia: Secondary | ICD-10-CM

## 2018-08-12 DIAGNOSIS — N959 Unspecified menopausal and perimenopausal disorder: Secondary | ICD-10-CM | POA: Diagnosis not present

## 2018-08-12 MED ORDER — LEVOFLOXACIN 500 MG PO TABS: 500.0000 mg | ORAL_TABLET | Freq: Every day | ORAL | 0 refills | Status: DC

## 2018-08-12 MED ORDER — AZELASTINE HCL 0.1 % NA SOLN: 1.0000 | Freq: Two times a day (BID) | NASAL | 11 refills | Status: DC

## 2018-08-12 MED ORDER — CITALOPRAM HYDROBROMIDE 20 MG PO TABS: 20.0000 mg | ORAL_TABLET | Freq: Every day | ORAL | 3 refills | Status: DC

## 2018-08-12 NOTE — Patient Instructions (Signed)
**Note De-Identified Gallion Obfuscation** It was very nice to see you today!  Please start the astelin.  Start the antibiotic if your symptoms do not improve in a few days.   Start the Celexa.  Stop the Zoloft.  We will put in referral for you to get your mammogram.  Please come back in 6 months, or sooner as needed.  Take care, Dr Jerline Pain

## 2018-08-12 NOTE — Progress Notes (Signed)
**Note De-identified Magnussen Obfuscation**   **Note De-Identified Teo Obfuscation** Chief Complaint:  Felicia Schultz is a 68 y.o. female who presents today with a chief complaint of sinusitis.   Assessment/Plan:  Sinusitis Likely secondary to viral URI. No signs of bacterial infection. Start Astelin for rhinorrhea/sinus congestion.  Sent in a "pocket prescription" for Levaquin with strict instruction to not start unless symptoms worsen or fail to improve within the next several days. Recommended tylenol and/or motrin as needed for low grade fever and pain. Encouraged good oral hydration. Return precautions reviewed. Follow up as needed.   Breast pain We will obtain diagnostic bilateral mammogram.  Postmenopausal symptoms Will stop Zoloft and switch to Celexa.  Recommended patient take this every night and not as needed.  Again discussed long-term side effects of Premarin use.  Patient voiced understanding.  Said that she would like to eventually get off this medication.  Hopefully with switch from Zoloft to Celexa that she will need to use less often.  Follow-up in 3 to 6 months.     Subjective:  HPI:  Sinusitis Started about a week ago. Was recently in the ED and thinks that she may have picked something up there. Associated with facial pressure. Some alternating fevers and chills. No treatmtents tried.  No other obvious alleviating or aggravating factors.  Breast Pain Patient is also had intermittent breast pain in each of her breasts over the past several months.  She has not noticed any appreciable lumps.  No obvious triggering events.  Menopausal symptoms Chronic problem.  Worsening.  She has been on Zoloft for several years however thinks it is no longer working.  Additionally thinks that it could be causing some burning sensation in the lower abdomen.  She also occasionally takes Premarin as needed.  Still has frequent hot flashes that are disturbing to her daily routines.  ROS: Per HPI  PMH: She reports that she has never smoked. She has never used smokeless  tobacco. She reports that she does not drink alcohol or use drugs.      Objective:  Physical Exam: BP 126/76 (BP Location: Left Arm, Patient Position: Sitting, Cuff Size: Large)   Pulse 71   Temp 98.9 F (37.2 C) (Oral)   Ht 5\' 4"  (1.626 m)   Wt 183 lb (83 kg)   SpO2 96%   BMI 31.41 kg/m   Gen: NAD, resting comfortably HEENT: TMs with clear effusion.  OP clear.  Nasal mucosa erythematous and boggy bilaterally. CV: Regular rate and rhythm with no murmurs appreciated Pulm: Normal work of breathing, clear to auscultation bilaterally with no crackles, wheezes, or rhonchi Breast: Deferred.     Algis Greenhouse. Jerline Pain, MD 08/12/2018 4:51 PM

## 2018-08-12 NOTE — Assessment & Plan Note (Signed)
**Note De-Identified Kratzke Obfuscation** Will stop Zoloft and switch to Celexa.  Recommended patient take this every night and not as needed.  Again discussed long-term side effects of Premarin use.  Patient voiced understanding.  Said that she would like to eventually get off this medication.  Hopefully with switch from Zoloft to Celexa that she will need to use less often.  Follow-up in 3 to 6 months.

## 2018-08-13 ENCOUNTER — Ambulatory Visit: Payer: BLUE CROSS/BLUE SHIELD | Admitting: Family Medicine

## 2018-08-17 ENCOUNTER — Other Ambulatory Visit: Payer: Self-pay | Admitting: Family Medicine

## 2018-08-17 DIAGNOSIS — N644 Mastodynia: Secondary | ICD-10-CM

## 2018-08-20 ENCOUNTER — Ambulatory Visit: Payer: Self-pay

## 2018-08-20 MED ORDER — BENZONATATE 100 MG PO CAPS: 100.0000 mg | ORAL_CAPSULE | Freq: Two times a day (BID) | ORAL | 0 refills | Status: DC | PRN

## 2018-08-20 NOTE — Addendum Note (Signed)
**Note De-Identified Colgan Obfuscation** Addended by: Marin Olp on: 08/20/2018 02:41 PM   Modules accepted: Orders

## 2018-08-20 NOTE — Telephone Encounter (Signed)
**Note De-identified Lubbers Obfuscation** Patient notified and verbalized understanding. 

## 2018-08-20 NOTE — Telephone Encounter (Addendum)
**Note De-Identified Lecount Obfuscation** Not a [facility] pt. Thank you. Fwd to [place].

## 2018-08-20 NOTE — Telephone Encounter (Signed)
**Note De-identified Manfredi Obfuscation** See note

## 2018-08-20 NOTE — Telephone Encounter (Signed)
**Note De-Identified Felicia Schultz** Outgoing  Call to Patient who  Complains of coughing spells.  Patient  Left work  Administrator, [employer] due to coughing  Spells.  Patient  Rates coughing as  Severe.  Patient  States that  She was  Coughing  So hard that she  Had SOB at  Times.  Reports coughing like   she  had  The Whooping  Cough.  Thinks she had a temperature last  Night. A low grade temp this am. Patient states that  She has coughed so much   unttil her back  And shoulders ache. Reports  Sputum as being yellowish green.  Patient  Also reports  Completing antibiotics.  Tried  Nightquil. Patient request something  Be called  Int [facility]/ [place].  Patient  Also request a phone  Call if  Something  Has been called in.     Boaz. Orlick Female, 68 y.o., 03-23-1951 MRN:  [id number] Phone:  [phone] Jerilynn Mages) PCP:  Vivi Barrack, MD Primary Cvg:  BLUE CROSS BLUE SHIELD/BCBS OTHER Next Appt With Family Medicine 02/15/2019 at 9:40 AM Message from Bea Graff, NT sent at 08/20/2018 11:46 AM EST   Summary: cough   Pt states that she was seen on 08/12/2018 for a sinus infection. She has finished the antibiotic but now has a cough that she would like to see if something can be called in to help with the cough.         Call History    Type Contact  08/20/2018 11:45 AM Phone (Incoming) Cayson, Marcy Panning (Self)  Phone: [phone] (H)  User: Bea Graff, NT    Reason for Disposition . SEVERE coughing spells (e.g., whooping sound after coughing, vomiting after coughing)  Answer Assessment - Initial Assessment Questions 1. ONSET: "When did the cough begin?"      Yesterday  2. SEVERITY: "How bad is the cough today?"     severe 3. RESPIRATORY DISTRESS: "Describe your breathing."      Coughing  So  Hard lost  Breath.  Like  Whopping cough. 4. FEVER: "Do you have a fever?" If so, ask: "What is your temperature, how was it measured, and when did it start?"      Thinks ao  last night.  thinka  Low grade one this am 5. SPUTUM:  "Describe the color of your sputum" (clear, white, yellow, green)     Yellow greenis 6. HEMOPTYSIS: "Are you coughing up any blood?" If so ask: "How much?" (flecks, streaks, tablespoons, etc.)     denies 7. CARDIAC HISTORY: "Do you have any history of heart disease?" (e.g., heart attack, congestive heart failure)     atrilfilation  Had an ablation 8. LUNG HISTORY: "Do you have any history of lung disease?"  (e.g., pulmonary embolus, asthma, emphysema)     denies 9. PE RISK FACTORS: "Do you have a history of blood clots?" (or: recent major surgery, recent prolonged travel, bedridden)     denies 10. OTHER SYMPTOMS: "Do you have any other symptoms?" (e.g., runny nose, wheezing, chest pain)      Runny  Nose whezzing when  Laying  down 11. PREGNANCY: "Is there any chance you are pregnant?" "When was your last menstrual period?"       na 12. TRAVEL: "Have you traveled out of the country in the last month?" (e.g., travel history, exposures)      denies  Protocols used: Clarissa

## 2018-08-20 NOTE — Telephone Encounter (Signed)
**Note De-identified Terwilliger Obfuscation** Please advise 

## 2018-08-20 NOTE — Telephone Encounter (Signed)
**Note De-Identified Marley Obfuscation** I sent in tessalon for her. I would recommend follow up with Dr. Jerline Pain next week if not improving or Saturday clinic if symptoms worsen

## 2018-08-26 ENCOUNTER — Encounter: Payer: Self-pay | Admitting: Family Medicine

## 2018-08-26 ENCOUNTER — Other Ambulatory Visit: Payer: Self-pay

## 2018-08-26 ENCOUNTER — Ambulatory Visit (INDEPENDENT_AMBULATORY_CARE_PROVIDER_SITE_OTHER): Payer: BLUE CROSS/BLUE SHIELD

## 2018-08-26 ENCOUNTER — Ambulatory Visit: Payer: BLUE CROSS/BLUE SHIELD | Admitting: Family Medicine

## 2018-08-26 VITALS — BP 102/68 | HR 82 | Temp 99.1°F | Wt 182.0 lb

## 2018-08-26 DIAGNOSIS — R05 Cough: Secondary | ICD-10-CM

## 2018-08-26 DIAGNOSIS — R059 Cough, unspecified: Secondary | ICD-10-CM

## 2018-08-26 DIAGNOSIS — J40 Bronchitis, not specified as acute or chronic: Secondary | ICD-10-CM | POA: Diagnosis not present

## 2018-08-26 DIAGNOSIS — R509 Fever, unspecified: Secondary | ICD-10-CM

## 2018-08-26 LAB — POCT RAPID STREP A (OFFICE): Rapid Strep A Screen: NEGATIVE

## 2018-08-26 LAB — POCT INFLUENZA A/B
Influenza A, POC: NEGATIVE
Influenza B, POC: NEGATIVE

## 2018-08-26 MED ORDER — HYDROCODONE-HOMATROPINE 5-1.5 MG/5ML PO SYRP: 5.0000 mL | ORAL_SOLUTION | Freq: Three times a day (TID) | ORAL | 0 refills | Status: DC | PRN

## 2018-08-26 NOTE — Patient Instructions (Signed)
**Note De-identified Fujita Obfuscation** Acute Bronchitis, Adult  Acute bronchitis is sudden (acute) swelling of the air tubes (bronchi) in the lungs. Acute bronchitis causes these tubes to fill with mucus, which can make it hard to breathe. It can also cause coughing or wheezing. In adults, acute bronchitis usually goes away within 2 weeks. A cough caused by bronchitis may last up to 3 weeks. Smoking, allergies, and asthma can make the condition worse. Repeated episodes of bronchitis may cause further lung problems, such as chronic obstructive pulmonary disease (COPD). What are the causes? This condition can be caused by germs and by substances that irritate the lungs, including:  Cold and flu viruses. This condition is most often caused by the same virus that causes a cold.  Bacteria.  Exposure to tobacco smoke, dust, fumes, and air pollution. What increases the risk? This condition is more likely to develop in people who:  Have close contact with someone with acute bronchitis.  Are exposed to lung irritants, such as tobacco smoke, dust, fumes, and vapors.  Have a weak immune system.  Have a respiratory condition such as asthma. What are the signs or symptoms? Symptoms of this condition include:  A cough.  Coughing up clear, yellow, or green mucus.  Wheezing.  Chest congestion.  Shortness of breath.  A fever.  Body aches.  Chills.  A sore throat. How is this diagnosed? This condition is usually diagnosed with a physical exam. During the exam, your health care provider may order tests, such as chest X-rays, to rule out other conditions. He or she may also:  Test a sample of your mucus for bacterial infection.  Check the level of oxygen in your blood. This is done to check for pneumonia.  Do a chest X-ray or lung function testing to rule out pneumonia and other conditions.  Perform blood tests. Your health care provider will also ask about your symptoms and medical history. How is this treated? Most  cases of acute bronchitis clear up over time without treatment. Your health care provider may recommend:  Drinking more fluids. Drinking more makes your mucus thinner, which may make it easier to breathe.  Taking a medicine for a fever or cough.  Taking an antibiotic medicine.  Using an inhaler to help improve shortness of breath and to control a cough.  Using a cool mist vaporizer or humidifier to make it easier to breathe. Follow these instructions at home: Medicines  Take over-the-counter and prescription medicines only as told by your health care provider.  If you were prescribed an antibiotic, take it as told by your health care provider. Do not stop taking the antibiotic even if you start to feel better. General instructions   Get plenty of rest.  Drink enough fluids to keep your urine pale yellow.  Avoid smoking and secondhand smoke. Exposure to cigarette smoke or irritating chemicals will make bronchitis worse. If you smoke and you need help quitting, ask your health care provider. Quitting smoking will help your lungs heal faster.  Use an inhaler, cool mist vaporizer, or humidifier as told by your health care provider.  Keep all follow-up visits as told by your health care provider. This is important. How is this prevented? To lower your risk of getting this condition again:  Wash your hands often with soap and water. If soap and water are not available, use hand sanitizer.  Avoid contact with people who have cold symptoms.  Try not to touch your hands to your mouth, nose, or eyes.   **Note De-identified Kleiman Obfuscation** To lower your risk of getting this condition again:  · Wash your hands often with soap and water. If soap and water are not available, use hand sanitizer.  · Avoid contact with people who have cold symptoms.  · Try not to touch your hands to your mouth, nose, or eyes.  · Make sure to get the flu shot every year.  Contact a health care provider if:  · Your symptoms do not improve in 2 weeks of treatment.  Get help right away if:  · You cough up blood.  · You have chest pain.  · You have severe shortness of breath.  · You become dehydrated.  · You faint or keep feeling like you are going to faint.  · You keep vomiting.  · You have a severe headache.  · Your  fever or chills gets worse.  This information is not intended to replace advice given to you by your health care provider. Make sure you discuss any questions you have with your health care provider.  Document Released: 07/10/2004 Document Revised: 01/14/2017 Document Reviewed: 11/21/2015  Elsevier Interactive Patient Education © 2019 Elsevier Inc.      Cough, Adult    Coughing is a reflex that clears your throat and your airways. Coughing helps to heal and protect your lungs. It is normal to cough occasionally, but a cough that happens with other symptoms or lasts a long time may be a sign of a condition that needs treatment. A cough may last only 2-3 weeks (acute), or it may last longer than 8 weeks (chronic).  What are the causes?  Coughing is commonly caused by:  · Breathing in substances that irritate your lungs.  · A viral or bacterial respiratory infection.  · Allergies.  · Asthma.  · Postnasal drip.  · Smoking.  · Acid backing up from the stomach into the esophagus (gastroesophageal reflux).  · Certain medicines.  · Chronic lung problems, including COPD (or rarely, lung cancer).  · Other medical conditions such as heart failure.  Follow these instructions at home:  Pay attention to any changes in your symptoms. Take these actions to help with your discomfort:  · Take medicines only as told by your health care provider.  ? If you were prescribed an antibiotic medicine, take it as told by your health care provider. Do not stop taking the antibiotic even if you start to feel better.  ? Talk with your health care provider before you take a cough suppressant medicine.  · Drink enough fluid to keep your urine clear or pale yellow.  · If the air is dry, use a cold steam vaporizer or humidifier in your bedroom or your home to help loosen secretions.  · Avoid anything that causes you to cough at work or at home.  · If your cough is worse at night, try sleeping in a semi-upright position.  · Avoid cigarette smoke. If  you smoke, quit smoking. If you need help quitting, ask your health care provider.  · Avoid caffeine.  · Avoid alcohol.  · Rest as needed.  Contact a health care provider if:  · You have new symptoms.  · You cough up pus.  · Your cough does not get better after 2-3 weeks, or your cough gets worse.  · You cannot control your cough with suppressant medicines and you are losing sleep.  · You develop pain that is getting worse or pain that is not controlled with pain medicines.  · 

## 2018-08-26 NOTE — Progress Notes (Signed)
**Note De-Identified Coulibaly Obfuscation** Subjective:    Patient ID: Felicia Schultz, female    DOB: [birth year], 68 y.o.   MRN: [id number]  No chief complaint on file.   HPI Patient was seen today for ongoing concern.  Typically seen by Dr. Jerline Pain.  Pt endosrse cough, fever, nausea, feeling achy x 2 wks.  Pt states symptoms started prior to [facility] 2/27 with pcp.  Had sore throat x 2 days.  Given wait and see rx for levaquin for continued symptoms.  Pt completed abx course.  Called clinic on 3/6 for continued cough.  Given rx for tessalon.  Pt did not take tessalon after reading the S/Es.  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   Past Medical History:  Diagnosis Date  . Allergy   . Atrial flutter ([place])    typical appearing  . Cancer ([place])    basal ca - nose - 1985  . GERD (gastroesophageal reflux disease)   . History of diabetes mellitus 11/13/2015  . Hypertension   . Hypokalemia   . Osteoporosis 2000  . Paroxysmal atrial fibrillation ([facility])   . Personal history of colonic polyps - sessile serrated 11/23/2013    Allergies  Allergen Reactions  . Amoxicillin-Pot Clavulanate Nausea Only    Severe nausea. Couldn't keep medication down  Severe nausea. Couldn't keep medication down  . Clarithromycin Hives  . Nitrofurantoin Other (See Comments) and Rash    Unknown Palms and hand itching, turned bright red per pt  . Sulfonamide Derivatives Other (See Comments)    Abdominal cramping, nausea  . Nitrofurantoin Macrocrystal Swelling  . Sulfa Antibiotics Nausea And Vomiting and Other (See Comments)    Unknown    ROS General: Denies chills, night sweats, changes in weight, changes in appetite.   +fever, achy HEENT: Denies headaches, ear pain, changes in vision, rhinorrhea, sore throat CV: Denies CP, palpitations, SOB, orthopnea Pulm: Denies SOB, wheezing   +cough GI: Denies abdominal pain, vomiting, diarrhea, constipation   +nausea GU: Denies dysuria,  hematuria, frequency, vaginal discharge Msk: Denies muscle cramps, joint pains Neuro: Denies weakness, numbness, tingling Skin: Denies rashes, bruising Psych: Denies depression, anxiety, hallucinations      Objective:    Blood pressure 102/68, pulse 82, temperature 99.1 F (37.3 C), temperature source Oral, weight 182 lb (82.6 kg), SpO2 97 %.  Gen. Pleasant, well-nourished, in no distress, normal affect   HEENT: [place]/AT, face symmetric, no scleral icterus, PERRLA, nares patent without drainage, pharynx without erythema or exudate.  TMs normal b/l.  No cervical lymphadenopathy Lungs: cough, no accessory muscle use, CTAB, no wheezes or rales Cardiovascular: RRR, no m/r/g, no peripheral edema Neuro:  A&Ox3, CN II-XII intact, normal gait  Wt Readings from Last 3 Encounters:  08/26/18 182 lb (82.6 kg)  08/12/18 183 lb (83 kg)  06/25/18 186 lb 12.8 oz (84.7 kg)    Lab Results  Component Value Date   WBC 4.5 02/10/2018   HGB 13.1 02/10/2018   HCT 39.8 02/10/2018   PLT 198.0 02/10/2018   GLUCOSE 95 02/10/2018   CHOL 151 06/18/2017   TRIG 59 06/18/2017   HDL 74 06/18/2017   LDLDIRECT 60 11/13/2015   LDLCALC 65 06/18/2017   ALT 13 02/10/2018   AST 15 02/10/2018   NA 140 02/10/2018   K 3.9 02/10/2018   CL 106 02/10/2018   CREATININE 0.65 02/10/2018   BUN 19 02/10/2018   CO2 26 02/10/2018   TSH 1.39 02/10/2018 **Note De-Identified Holstein Obfuscation** HGBA1C 5.4 02/10/2018   MICROALBUR 0.8 05/20/2010    Assessment/Plan:  Bronchitis  -suspect bronchitis based on exam and hx. - Plan: HYDROcodone-homatropine (HYCODAN) 5-1.5 MG/5ML syrup -Consider steroid burst pending CXR --- Update: CXR with chronic bronchitic changes---  Fever, unspecified fever cause  -Likely viral in nature -Okay to take Tylenol or NSAIDs PRN - Plan: POCT rapid strep A negative -POC Influenza A/B negative  Cough  -discussed various causes including bronchitis, viral/post viral etiology -Based on duration of symptoms low suspicion for  COVID 19.  Pt offered testing however declined. -Given handout -Given duration of coughing will obtain chest x-ray - Plan: DG Chest 2 View, HYDROcodone-homatropine (HYCODAN) 5-1.5 MG/5ML syrup  F/u with PCP prn  Grier Mitts, MD

## 2018-08-27 ENCOUNTER — Telehealth: Payer: Self-pay | Admitting: *Deleted

## 2018-08-27 ENCOUNTER — Other Ambulatory Visit: Payer: Self-pay

## 2018-08-27 ENCOUNTER — Encounter: Payer: Self-pay | Admitting: Family Medicine

## 2018-08-27 NOTE — Telephone Encounter (Signed)
**Note De-Identified Leach Obfuscation** Called pt left a detailed message regarding her chest x-ray results and instructions, advised pt to call the office for further information

## 2018-08-27 NOTE — Telephone Encounter (Signed)
**Note De-identified Wheeler Obfuscation** Left message to return call to our office.  

## 2018-08-27 NOTE — Telephone Encounter (Signed)
**Note De-Identified Frentz Obfuscation** Copied from [place] [phone]. Topic: General - Other >> Aug 27, 2018  4:17 PM Gustavus Messing wrote: Reason for CRM: Patient was offered the test for [facility] by Dr. Volanda Napoleon but she did not take the test because she did not think she had enough PTO. Since then her job told her to go ahead and have the test done so the ofice needed to be notified and the patient should be called back about the next steps to take  Clinic RN consulted with Dr. Volanda Napoleon. Per Dr. Volanda Napoleon patient should consult PCP d/t her low risk factors.

## 2018-08-27 NOTE — Telephone Encounter (Signed)
**Note De-identified Moyers Obfuscation** Please call patient for more information

## 2018-08-30 NOTE — Telephone Encounter (Signed)
**Note De-Identified Delfavero Obfuscation** Patient is on the schedule for 08/31/2018.

## 2018-08-31 ENCOUNTER — Ambulatory Visit: Payer: BLUE CROSS/BLUE SHIELD | Admitting: Family Medicine

## 2018-08-31 ENCOUNTER — Telehealth: Payer: Self-pay

## 2018-08-31 ENCOUNTER — Other Ambulatory Visit: Payer: Self-pay

## 2018-08-31 ENCOUNTER — Other Ambulatory Visit: Payer: Self-pay | Admitting: Family Medicine

## 2018-08-31 DIAGNOSIS — R059 Cough, unspecified: Secondary | ICD-10-CM

## 2018-08-31 DIAGNOSIS — R05 Cough: Secondary | ICD-10-CM

## 2018-08-31 DIAGNOSIS — R509 Fever, unspecified: Secondary | ICD-10-CM

## 2018-08-31 NOTE — Telephone Encounter (Signed)
**Note De-Identified Greaser Obfuscation** Questions for Screening COVID-19  Symptom onset: Symptoms began 2 weeks ago.Patient has been running a fever of 101.4,fatigued,cough, SOB when walking across the room. Travel or Contacts: No travel,Has been in contact with students that traveled from [place] 3 - 4 weeks ago  During this illness, did/does the patient experience any of the following symptoms? Fever >100.69F [x]   Yes []   No []   Unknown Subjective fever (felt feverish) []   Yes []   No []   Unknown Chills [x]   Yes []   No []   Unknown Muscle aches (myalgia) [x]   Yes []   No []   Unknown Runny nose (rhinorrhea) [x]   Yes []   No []   Unknown Sore throat []   Yes []   No [x]   Unknown Cough (new onset or worsening of chronic cough) [x]   Yes []   No []   Unknown Shortness of breath (dyspnea) [x]   Yes []   No []   Unknown Nausea or vomiting []   Yes [x]   No []   Unknown Headache [x]   Yes []   No []   Unknown Abdominal pain  []   Yes [x]   No []   Unknown Diarrhea (?3 loose/looser than normal stools/24hr period) []   Yes [x]   No []   Unknown Other, specify:_____________________________________________   Patient risk factors: Smoker? []   Current []   Former [x]   Never If female, currently pregnant? []   Yes [x]   No  Patient Active Problem List   Diagnosis Date Noted  . S/P gastric bypass 07/09/2018  . Achilles tendinosis 04/30/2018  . Frequent stools 02/10/2018  . Spondylosis of lumbar joint 05/28/2017  . Plantar fasciitis, bilateral 05/28/2017  . Chronic pain of right wrist 05/28/2017  . Haglund's deformity of right heel 02/02/2017  . Postmenopausal symptoms 06/02/2016  . Arthritis of neck 07/19/2014  . GERD (gastroesophageal reflux disease) 07/19/2014  . Vitamin D deficiency 07/19/2014  . Personal history of colonic polyps - sessile serrated 11/23/2013  . Allergic rhinitis 11/11/2011    Plan:  [x]   High risk for COVID-19 with red flags go to ED (with CP, SOB, weak/lightheaded, or fever > 101.5). Call ahead.  []   High risk for COVID-19 but stable  will have car visit. Inform provider and coordinate time. Will be completed in afternoon. []   No red flags but URI signs or symptoms will go through side door and be seen in dedicated room.  Note: Referral to telemedicine is an appropriate alternative disposition for higher risk but stable. [facility] Tele[facility]/e-Visit: [phone].

## 2018-08-31 NOTE — Telephone Encounter (Signed)
**Note De-Identified Reister Obfuscation** Patient has been notified to go to drive up testing site at [address]

## 2018-09-06 LAB — NOVEL CORONAVIRUS, NAA: SARS-CoV-2, NAA: NOT DETECTED

## 2018-09-06 NOTE — Progress Notes (Signed)
**Note De-Identified Rendall Obfuscation** Dr Marigene Ehlers interpretation of your lab work:  Good news! Your coronavirus was was negative. You do not have coronavirus.    If you have any additional questions, please give [place] a call or send [place] a message through [place].  Take care, Dr Jerline Pain

## 2018-09-08 ENCOUNTER — Encounter: Payer: Self-pay | Admitting: Family Medicine

## 2018-09-08 ENCOUNTER — Ambulatory Visit (INDEPENDENT_AMBULATORY_CARE_PROVIDER_SITE_OTHER): Payer: BLUE CROSS/BLUE SHIELD | Admitting: Family Medicine

## 2018-09-08 DIAGNOSIS — R059 Cough, unspecified: Secondary | ICD-10-CM

## 2018-09-08 DIAGNOSIS — R05 Cough: Secondary | ICD-10-CM

## 2018-09-08 DIAGNOSIS — R5381 Other malaise: Secondary | ICD-10-CM

## 2018-09-08 MED ORDER — DOXYCYCLINE HYCLATE 100 MG PO TABS: 100.0000 mg | ORAL_TABLET | Freq: Two times a day (BID) | ORAL | 0 refills | Status: DC

## 2018-09-08 MED ORDER — PREDNISONE 50 MG PO TABS: ORAL_TABLET | ORAL | 0 refills | Status: DC

## 2018-09-08 MED ORDER — ALBUTEROL SULFATE HFA 108 (90 BASE) MCG/ACT IN AERS: 2.0000 | INHALATION_SPRAY | Freq: Four times a day (QID) | RESPIRATORY_TRACT | 0 refills | Status: DC | PRN

## 2018-09-08 NOTE — Progress Notes (Signed)
**Note De-identified Coffel Obfuscation**    **Note De-Identified Adriance Obfuscation** Chief Complaint:  Felicia Schultz is a 68 y.o. female who presents today for a virtual office visit with a chief complaint of cough.   Assessment/Plan:  Cough Previous chest x-ray consistent with bronchitis.  Given the symptoms are persistent for over a month and or not improving, I think it is reasonable to start course of antibiotics and steroids.  We will send in doxycycline 100 mg twice daily for 7 days and prednisone 50 mg daily x5 days and 25 mg daily for 2 days.  Also start albuterol inhaler.  Encouraged good oral hydration. Discussed reasons to return to care.  Discussed reasons to seek emergent care.  Follow-up as needed.    Subjective:  HPI:  Cough, acute problem Patient seen at another office 13 days ago for bronchitis.  Symptoms were thought to be viral in nature and she was given Hycodan cough syrup and treated symptomatically.  Her symptoms did not improve 4 days afterwards and she was tested for COVID at that time.  Her COVID testing was negative however symptoms have persisted.  She has had some subjective fevers off and on. Associated with a lot of fatigue and some wheeze.  ROS: Per HPI  PMH: She reports that she has never smoked. She has never used smokeless tobacco. She reports that she does not drink alcohol or use drugs.      Objective/Observations  Physical Exam: Gen: NAD, resting comfortably Pulm: Normal work of breathing. Speaking in full sentences. Neuro: Grossly normal, moves all extremities Psych: Normal affect and thought content  Virtual Visit [place] Video   I connected with Felicia Schultz on 09/08/18 at 11:00 AM EDT by a video enabled telemedicine application and verified that I am speaking with the correct person using two identifiers. I discussed the limitations of evaluation and management by telemedicine and the availability of in person appointments. The patient expressed understanding and agreed to proceed.   Patient location: Home Provider location:  [place] participating in the virtual visit: Myself and patient     Algis Greenhouse. Jerline Pain, MD 09/08/2018 11:19 AM

## 2018-09-15 ENCOUNTER — Encounter: Payer: Self-pay | Admitting: Family Medicine

## 2018-09-19 ENCOUNTER — Other Ambulatory Visit: Payer: Self-pay | Admitting: Sports Medicine

## 2018-09-20 NOTE — Telephone Encounter (Signed)
**Note De-Identified Sturgill Obfuscation** Last OV 07/09/2018 Last Vit D check 05/28/2017 - recheck in 12 weeks Last reifll 06/15/2018 #24/0 Next OV not scheduled

## 2018-09-20 NOTE — Telephone Encounter (Signed)
**Note De-Identified Blackburn Obfuscation** Pt does need a recheck of her VitD but I will go ahead and continue for an additional 6 weeks to hopefully get through [place].  Rx for 12 tablets sent to pharmacy

## 2018-09-20 NOTE — Telephone Encounter (Signed)
**Note De-Identified [place] Obfuscation** Called pt and advised that she will need f/u with Dr. Paulla Fore or Dr. Jerline Pain in 6 weeks to have Vit D rechecked. She does not have access to her calendar at this time and will need to call back to schedule.

## 2018-11-03 ENCOUNTER — Encounter: Payer: Self-pay | Admitting: Internal Medicine

## 2018-12-31 ENCOUNTER — Encounter: Payer: Self-pay | Admitting: Sports Medicine

## 2019-01-05 ENCOUNTER — Ambulatory Visit: Payer: BLUE CROSS/BLUE SHIELD | Admitting: Family Medicine

## 2019-01-10 ENCOUNTER — Other Ambulatory Visit: Payer: Self-pay

## 2019-01-10 ENCOUNTER — Ambulatory Visit: Payer: Self-pay

## 2019-01-10 ENCOUNTER — Encounter: Payer: Self-pay | Admitting: Family Medicine

## 2019-01-10 ENCOUNTER — Ambulatory Visit: Payer: BLUE CROSS/BLUE SHIELD | Admitting: Family Medicine

## 2019-01-10 VITALS — BP 128/76 | HR 88 | Ht 64.0 in | Wt 182.0 lb

## 2019-01-10 DIAGNOSIS — M722 Plantar fascial fibromatosis: Secondary | ICD-10-CM

## 2019-01-10 DIAGNOSIS — M6788 Other specified disorders of synovium and tendon, other site: Secondary | ICD-10-CM | POA: Diagnosis not present

## 2019-01-10 DIAGNOSIS — M79672 Pain in left foot: Secondary | ICD-10-CM

## 2019-01-10 DIAGNOSIS — M9261 Juvenile osteochondrosis of tarsus, right ankle: Secondary | ICD-10-CM

## 2019-01-10 DIAGNOSIS — E559 Vitamin D deficiency, unspecified: Secondary | ICD-10-CM | POA: Diagnosis not present

## 2019-01-10 MED ORDER — VITAMIN D (ERGOCALCIFEROL) 1.25 MG (50000 UNIT) PO CAPS: 50000.0000 [IU] | ORAL_CAPSULE | ORAL | 0 refills | Status: DC

## 2019-01-10 MED ORDER — MELOXICAM 15 MG PO TABS: 15.0000 mg | ORAL_TABLET | Freq: Every day | ORAL | 0 refills | Status: DC

## 2019-01-10 NOTE — Patient Instructions (Addendum)
**Note De-Identified Musso Obfuscation** Good to see you.  Ice 20 minutes 2 times daily. Usually after activity and before bed. Exercises 3 times a week.  Once weekly vitamin D for 12 weeks.  Meloxicam daily for 10 days then as needed.  K2 over the counter daily  Back to the boot for next 2 weeks then slowly back to shoe.  See me again in 3-4 weeks

## 2019-01-10 NOTE — Progress Notes (Signed)
**Note De-Identified Sobh Obfuscation** Fel[facility] [place], [place] [address] Phone: [phone] Subjective:   I Felicia Schultz am serving as a Education administrator for Dr. Hulan Saas.  I'm seeing this patient by the request  of:    CC: Foot and ankle pain  BHA:[id number]  Felicia Schultz is a 68 y.o. female coming in with complaint of foot/ankle pain. Plantar fascia.  Patient has been seen multiple times.  Patient was seen another provider.  Patient was given an injection last time 6 months ago with some improvement.  Never was without pain.  Onset- 1 year  Character- sharp  Aggravating factors- standing, sitting Reliving factors- [place]  Therapies tried-  Severity-8 out of 10     Past Medical History:  Diagnosis Date  . Allergy   . Atrial flutter ([place])    typical appearing  . Cancer ([facility])    basal ca - nose - 1985  . GERD (gastroesophageal reflux disease)   . History of diabetes mellitus 11/13/2015  . Hypertension   . Hypokalemia   . Osteoporosis 2000  . Paroxysmal atrial fibrillation ([facility])   . Personal history of colonic polyps - sessile serrated 11/23/2013   Past Surgical History:  Procedure Laterality Date  . APPENDECTOMY    . ATRIAL FLUTTER ABLATION N/A 09/05/2014   Procedure: ATRIAL FLUTTER ABLATION;  Surgeon: Thompson Grayer, MD;  Location: [facility] CATH LAB;  Service: Cardiovascular;  Laterality: N/A;  . CARPAL TUNNEL RELEASE Bilateral   . FOOT SURGERY Bilateral 2012   shorten bones in both feet  . GASTRIC BYPASS    . removal of ovary Right 1987  . SPINE SURGERY  2000/2012  . TOTAL ABDOMINAL HYSTERECTOMY  1999  . WRIST SURGERY Right 2018   Dr. Apolonio Schneiders -ganglion excision   Social History   Socioeconomic History  . Marital status: Married    Spouse name: Not on file  . Number of children: 0  . Years of education: Not on file  . Highest education level: Not on file  Occupational History  . Occupation: Actuary: [employer]   . Financial resource strain: Not on file  . Food insecurity    Worry: Not on file    Inability: Not on file  . Transportation needs    Medical: Not on file    Non-medical: Not on file  Tobacco Use  . Smoking status: Never Smoker  . Smokeless tobacco: Never Used  Substance and Sexual Activity  . Alcohol use: No  . Drug use: No  . Sexual activity: Never    Birth control/protection: Abstinence  Lifestyle  . Physical activity    Days per week: Not on file    Minutes per session: Not on file  . Stress: Not on file  Relationships  . Social Herbalist on phone: Not on file    Gets together: Not on file    Attends religious service: Not on file    Active member of club or organization: Not on file    Attends meetings of clubs or organizations: Not on file    Relationship status: Not on file  Other Topics Concern  . 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   1-2 caffeine/day   Allergies  Allergen Reactions  . Amoxicillin-Pot Clavulanate Nausea Only    Severe nausea. **Note De-Identified [place] Obfuscation** Couldn't keep medication down  Severe nausea. Couldn't keep medication down  . Clarithromycin Hives  . Nitrofurantoin Other (See Comments) and Rash    Unknown Palms and hand itching, turned bright red per pt  . Sulfonamide Derivatives Other (See Comments)    Abdominal cramping, nausea  . Nitrofurantoin Macrocrystal Swelling  . Sulfa Antibiotics Nausea And Vomiting and Other (See Comments)    Unknown   Family History  Problem Relation Age of Onset  . Diabetes Mother   . Heart disease Mother   . Arthritis Mother   . Hearing loss Mother   . Hyperlipidemia Mother   . Hypertension Mother   . Kidney disease Mother   . Learning disabilities Mother   . Miscarriages / [place] Mother   . Heart disease Father   . Hypertension Father   . Alcohol abuse Father   . Hyperlipidemia Father   . Emphysema  Maternal Grandmother   . Cancer Maternal Grandmother        throat and lung(smoker)  . Heart disease Brother   . Cancer Brother        lung  . Colon cancer Maternal Grandfather   . Cancer Maternal Grandfather        colon  . Scleroderma Sister   . Early death Sister        schlederma  . Miscarriages / Stillbirths Sister   . Stroke Brother   . Emphysema Sister   . Diabetes Sister   . Hyperlipidemia Sister   . Hypertension Sister   . Mental illness Sister   . Miscarriages / Stillbirths Sister   . Stroke Sister   . Lung cancer Brother        smoker  . Diabetes Brother   . Diabetes Sister   . Pancreatic cancer Neg Hx   . Rectal cancer Neg Hx   . Stomach cancer Neg Hx     Current Outpatient Medications (Endocrine & Metabolic):  .  estrogens, conjugated, (PREMARIN) 0.625 MG tablet, Take 1 tablet (0.625 mg total) by mouth daily. .  predniSONE (DELTASONE) 50 MG tablet, Take 1 tablet daily for 5 days. Then take 1/2 tablet daily for 2 days.   Current Outpatient Medications (Respiratory):  .  albuterol (PROVENTIL HFA;VENTOLIN HFA) 108 (90 Base) MCG/ACT inhaler, Inhale 2 puffs into the lungs every 6 (six) hours as needed for wheezing or shortness of breath. Marland Kitchen  azelastine (ASTELIN) 0.1 % nasal spray, Place 1-2 sprays into both nostrils 2 (two) times daily. Use in each nostril as directed .  HYDROcodone-homatropine (HYCODAN) 5-1.5 MG/5ML syrup, Take 5 mLs by mouth every 8 (eight) hours as needed for cough.  Current Outpatient Medications (Analgesics):  .  meloxicam (MOBIC) 15 MG tablet, Take 1 tablet (15 mg total) by mouth daily.   Current Outpatient Medications (Other):  Marland Kitchen  Cholecalciferol (WEEKLY-D) 1.25 MG (50000 UT) capsule, Take 1 capsule (50,000 Units total) by mouth 2 (two) times a week. .  citalopram (CELEXA) 20 MG tablet, Take 1 tablet (20 mg total) by mouth daily. .  cyclobenzaprine (FLEXERIL) 5 MG tablet, Take 1 tablet (5 mg total) by mouth 3 (three) times daily as needed  for muscle spasms. Marland Kitchen  dicyclomine (BENTYL) 20 MG tablet, Take 1 tablet (20 mg total) by mouth every 6 (six) hours. (Patient taking differently: Take 20 mg by mouth as needed. ) .  doxycycline (VIBRA-TABS) 100 MG tablet, Take 1 tablet (100 mg total) by mouth 2 (two) times daily. Marland Kitchen  gabapentin (NEURONTIN) 100 MG **Note De-Identified [place] Obfuscation** capsule, Take 2-3 capsules (200-300 mg total) by mouth at bedtime. May also take 1 capsule (100 mg total) 3 (three) times daily as needed. .  gabapentin (NEURONTIN) 300 MG capsule, Take 1 capsule, orally, up to TID or as directed .  Vitamin D, Ergocalciferol, (DRISDOL) 1.25 MG (50000 UT) CAPS capsule, Take 1 capsule (50,000 Units total) by mouth every 7 (seven) days.    Past medical history, social, surgical and family history all reviewed in electronic medical record.  No pertanent information unless stated regarding to the chief complaint.   Review of Systems:  No headache, visual changes, nausea, vomiting, diarrhea, constipation, dizziness, abdominal pain, skin rash, fevers, chills, night sweats, weight loss, swollen lymph nodes, body aches, joint swelling,  chest pain, shortness of breath, mood changes.  Positive muscle aches  Objective  Blood pressure 128/76, pulse 88, height 5\' 4"  (1.626 m), weight 182 lb (82.6 kg), SpO2 97 %.    General: No apparent distress alert and oriented x3 mood and affect normal, dressed appropriately.  HEENT: Pupils equal, extraocular movements intact  Respiratory: Patient's speak in full sentences and does not appear short of breath  Cardiovascular: No lower extremity edema, non tender, no erythema  Skin: Warm dry intact with no signs of infection or rash on extremities or on axial skeleton.  Abdomen: Soft nontender  Neuro: Cranial nerves II through XII are intact, neurovascularly intact in all extremities with 2+ DTRs and 2+ pulses.  Lymph: No lymphadenopathy of posterior or anterior cervical chain or axillae bilaterally.  Gait normal with good  balance and coordination.  MSK:  Non tender with full range of motion and good stability and symmetric strength and tone of shoulders, elbows, wrist, hip, kneebilaterally.  Ankle: left Patient does have significant tightness of the posterior cord.  Patient has a Haglund nodule noted.  Patient does have some pain over the medial calcaneal area over the plantar fascia.  Patient does have some mild weakness.  MSK US performed of: Left foot and ankle This study was ordered, performed, and interpreted by Charlann Boxer D.O.  Foot/Ankle:   Significant calcific changes noted of the Achilles.  Patient also has some swelling of the plantar fashion noted but patient also has what appears to be a cortical defect noted of the calcaneal bone.  IMPRESSION: Calcific Achilles tendinosis, possible stress reaction of the calcaneus bone as well as chronic plantar fasciitis    Impression and Recommendations:     This case required medical decision making of moderate complexity. The above documentation has been reviewed and is accurate and complete Felicia Pulley, DO       Note: This dictation was prepared with Dragon dictation along with smaller phrase technology. Any transcriptional errors that result from this process are unintentional.

## 2019-01-10 NOTE — Assessment & Plan Note (Signed)
**Note De-Identified Koon Obfuscation** Chronic overall.  Significant amount of calcific changes noted.  Restart once weekly vitamin D, icing regimen, home exercises, discussed the cam walker.  Follow-up again in 4 to 8 weeks

## 2019-01-10 NOTE — Assessment & Plan Note (Signed)
**Note De-identified Aversa Obfuscation** Restart today

## 2019-01-12 ENCOUNTER — Telehealth: Payer: Self-pay | Admitting: Physical Therapy

## 2019-01-12 MED ORDER — AMBULATORY NON FORMULARY MEDICATION: 0 refills | Status: DC

## 2019-01-12 NOTE — Telephone Encounter (Signed)
**Note De-Identified Baney Obfuscation** Please see CRM.  Pt saw Dr. Tamala Julian on 01/10/19 so guessing this should go to you all.  Thanks, [place]  Copied from [employer] [phone]. Topic: General - Other >> Jan 12, 2019 11:31 AM Alanda Slim E wrote: Reason for CRM: Tyisha with [employer] called and stated Pt is wanting a knee scooter and [employer] needs the code for it from the provider if possible. Please advise

## 2019-01-12 NOTE — Addendum Note (Signed)
**Note De-Identified [place] Obfuscation** Addended by: Douglass Rivers T on: 01/12/2019 03:57 PM   Modules accepted: Orders

## 2019-01-12 NOTE — Telephone Encounter (Signed)
**Note De-Identified Slevin Obfuscation** Spoke to pt, she will come by the office tomorrow to pickup a prescription.

## 2019-01-12 NOTE — Telephone Encounter (Signed)
**Note De-identified Mclaurin Obfuscation** lmovm for pt to return call.  

## 2019-01-13 ENCOUNTER — Other Ambulatory Visit: Payer: Self-pay

## 2019-01-13 ENCOUNTER — Other Ambulatory Visit: Payer: Self-pay | Admitting: *Deleted

## 2019-01-13 DIAGNOSIS — M79673 Pain in unspecified foot: Secondary | ICD-10-CM

## 2019-01-13 MED ORDER — AMBULATORY NON FORMULARY MEDICATION: 0 refills | Status: DC

## 2019-01-18 ENCOUNTER — Ambulatory Visit: Payer: BLUE CROSS/BLUE SHIELD | Admitting: Family Medicine

## 2019-02-09 ENCOUNTER — Ambulatory Visit: Payer: BLUE CROSS/BLUE SHIELD | Admitting: Family Medicine

## 2019-02-10 ENCOUNTER — Ambulatory Visit: Payer: Self-pay

## 2019-02-10 ENCOUNTER — Encounter: Payer: Self-pay | Admitting: Family Medicine

## 2019-02-10 ENCOUNTER — Other Ambulatory Visit: Payer: Self-pay

## 2019-02-10 ENCOUNTER — Ambulatory Visit: Payer: BLUE CROSS/BLUE SHIELD | Admitting: Family Medicine

## 2019-02-10 VITALS — BP 126/92 | HR 70 | Ht 64.0 in | Wt 181.0 lb

## 2019-02-10 DIAGNOSIS — M79672 Pain in left foot: Secondary | ICD-10-CM

## 2019-02-10 DIAGNOSIS — M722 Plantar fascial fibromatosis: Secondary | ICD-10-CM

## 2019-02-10 NOTE — Assessment & Plan Note (Signed)
**Note De-Identified Mckimmy Obfuscation** Continues to have more of the plantar fasciitis.  Discussed with patient in great length about icing regimen and home exercises, patient is failed all conservative therapy.  Did have an injection previously that gave her weeks of improvement.  At this point though I would like to try PRP.  Patient will be set up in the near future.  Follow-up with me with that and then 6 weeks after the injection likely.

## 2019-02-10 NOTE — Progress Notes (Signed)
**Note De-Identified [place] Obfuscation** Felicia Schultz, [place] [address] Phone: [phone] Subjective:   Felicia Schultz, am serving as a scribe for Dr. Hulan Saas.  I'm seeing this patient by the request  of:    CC: Back pain follow-up  [id number]   01/10/2019 Chronic overall.  Significant amount of calcific changes noted.  Restart once weekly vitamin D, icing regimen, home exercises, discussed the cam walker.  Follow-up again in 4 to 8 weeks  Update 02/10/2019 Felicia Schultz is a 68 y.o. female coming in with complaint of left foot pain. Patient states that she has not had any improvement from last visit. Was wearing cam walker. Tried to come out of it but had increase in pain. Patient wore boot up until yesterday. Has sharp pain throughout the entire foot with walking. Heel pain feels like a "stone bruise." Did use knee walker for 2 weeks.   Patient states initially seemed to get a little better but with the normal walking seems to be worse again.  Schultz new symptoms just worsening of previous symptoms.     Past Medical History:  Diagnosis Date  . Allergy   . Atrial flutter ([place])    typical appearing  . Cancer ([place])    basal ca - nose - 1985  . GERD (gastroesophageal reflux disease)   . History of diabetes mellitus 11/13/2015  . Hypertension   . Hypokalemia   . Osteoporosis 2000  . Paroxysmal atrial fibrillation ([facility])   . Personal history of colonic polyps - sessile serrated 11/23/2013   Past Surgical History:  Procedure Laterality Date  . APPENDECTOMY    . ATRIAL FLUTTER ABLATION N/A 09/05/2014   Procedure: ATRIAL FLUTTER ABLATION;  Surgeon: Thompson Grayer, MD;  Location: [facility] CATH LAB;  Service: Cardiovascular;  Laterality: N/A;  . CARPAL TUNNEL RELEASE Bilateral   . FOOT SURGERY Bilateral 2012   shorten bones in both feet  . GASTRIC BYPASS    . removal of ovary Right 1987  . SPINE SURGERY  2000/2012  . TOTAL ABDOMINAL HYSTERECTOMY  1999  . WRIST SURGERY Right  2018   Dr. Apolonio Schneiders -ganglion excision   Social History   Socioeconomic History  . Marital status: Married    Spouse name: Not on file  . Number of children: 0  . Years of education: Not on file  . Highest education level: Not on file  Occupational History  . Occupation: Actuary: [employer]  . Financial resource strain: Not on file  . Food insecurity    Worry: Not on file    Inability: Not on file  . Transportation needs    Medical: Not on file    Non-medical: Not on file  Tobacco Use  . Smoking status: Never Smoker  . Smokeless tobacco: Never Used  Substance and Sexual Activity  . Alcohol use: Schultz  . Drug use: Schultz  . Sexual activity: Never    Birth control/protection: Abstinence  Lifestyle  . Physical activity    Days per week: Not on file    Minutes per session: Not on file  . Stress: Not on file  Relationships  . Social Herbalist on phone: Not on file    Gets together: Not on file    Attends religious service: Not on file    Active member of club or organization: Not on file    Attends meetings of clubs or **Note De-Identified [place] Obfuscation** organizations: Not on file    Relationship status: Not on file  Other Topics Concern  . 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   1-2 caffeine/day   Allergies  Allergen Reactions  . Amoxicillin-Pot Clavulanate Nausea Only    Severe nausea. Couldn't keep medication down  Severe nausea. Couldn't keep medication down  . Clarithromycin Hives  . Nitrofurantoin Other (See Comments) and Rash    Unknown Palms and hand itching, turned bright red per pt  . Sulfonamide Derivatives Other (See Comments)    Abdominal cramping, nausea  . Nitrofurantoin Macrocrystal Swelling  . Sulfa Antibiotics Nausea And Vomiting and Other (See Comments)    Unknown   Family History  Problem Relation Age of Onset  . Diabetes  Mother   . Heart disease Mother   . Arthritis Mother   . Hearing loss Mother   . Hyperlipidemia Mother   . Hypertension Mother   . Kidney disease Mother   . Learning disabilities Mother   . Miscarriages / [place] Mother   . Heart disease Father   . Hypertension Father   . Alcohol abuse Father   . Hyperlipidemia Father   . Emphysema Maternal Grandmother   . Cancer Maternal Grandmother        throat and lung(smoker)  . Heart disease Brother   . Cancer Brother        lung  . Colon cancer Maternal Grandfather   . Cancer Maternal Grandfather        colon  . Scleroderma Sister   . Early death Sister        schlederma  . Miscarriages / Stillbirths Sister   . Stroke Brother   . Emphysema Sister   . Diabetes Sister   . Hyperlipidemia Sister   . Hypertension Sister   . Mental illness Sister   . Miscarriages / Stillbirths Sister   . Stroke Sister   . Lung cancer Brother        smoker  . Diabetes Brother   . Diabetes Sister   . Pancreatic cancer Neg Hx   . Rectal cancer Neg Hx   . Stomach cancer Neg Hx     Current Outpatient Medications (Endocrine & Metabolic):  .  estrogens, conjugated, (PREMARIN) 0.625 MG tablet, Take 1 tablet (0.625 mg total) by mouth daily. .  predniSONE (DELTASONE) 50 MG tablet, Take 1 tablet daily for 5 days. Then take 1/2 tablet daily for 2 days.   Current Outpatient Medications (Respiratory):  .  albuterol (PROVENTIL HFA;VENTOLIN HFA) 108 (90 Base) MCG/ACT inhaler, Inhale 2 puffs into the lungs every 6 (six) hours as needed for wheezing or shortness of breath. Marland Kitchen  azelastine (ASTELIN) 0.1 % nasal spray, Place 1-2 sprays into both nostrils 2 (two) times daily. Use in each nostril as directed .  HYDROcodone-homatropine (HYCODAN) 5-1.5 MG/5ML syrup, Take 5 mLs by mouth every 8 (eight) hours as needed for cough.  Current Outpatient Medications (Analgesics):  .  meloxicam (MOBIC) 15 MG tablet, Take 1 tablet (15 mg total) by mouth daily.   Current  Outpatient Medications (Other):  Marland Kitchen  AMBULATORY NON FORMULARY MEDICATION, Medication Name: Kneeling rolling walker Dx: [id number] .  Cholecalciferol (WEEKLY-D) 1.25 MG (50000 UT) capsule, Take 1 capsule (50,000 Units total) by mouth 2 (two) times a week. .  citalopram (CELEXA) 20 MG tablet, Take 1 tablet (20 mg total) by mouth daily. Marland Kitchen **Note De-Identified [place] Obfuscation** cyclobenzaprine (FLEXERIL) 5 MG tablet, Take 1 tablet (5 mg total) by mouth 3 (three) times daily as needed for muscle spasms. Marland Kitchen  dicyclomine (BENTYL) 20 MG tablet, Take 1 tablet (20 mg total) by mouth every 6 (six) hours. (Patient taking differently: Take 20 mg by mouth as needed. ) .  doxycycline (VIBRA-TABS) 100 MG tablet, Take 1 tablet (100 mg total) by mouth 2 (two) times daily. Marland Kitchen  gabapentin (NEURONTIN) 100 MG capsule, Take 2-3 capsules (200-300 mg total) by mouth at bedtime. May also take 1 capsule (100 mg total) 3 (three) times daily as needed. .  gabapentin (NEURONTIN) 300 MG capsule, Take 1 capsule, orally, up to TID or as directed .  Vitamin D, Ergocalciferol, (DRISDOL) 1.25 MG (50000 UT) CAPS capsule, Take 1 capsule (50,000 Units total) by mouth every 7 (seven) days.    Past medical history, social, surgical and family history all reviewed in electronic medical record.  Schultz pertanent information unless stated regarding to the chief complaint.   Review of Systems:  Schultz headache, visual changes, nausea, vomiting, diarrhea, constipation, dizziness, abdominal pain, skin rash, fevers, chills, night sweats, weight loss, swollen lymph nodes, body aches, joint swelling, muscle aches, chest pain, shortness of breath, mood changes.   Objective  Blood pressure (!) 126/92, pulse 70, height 5\' 4"  (1.626 m), weight 181 lb (82.1 kg), SpO2 97 %. Systems examined below as of    General: Schultz apparent distress alert and oriented x3 mood and affect normal, dressed appropriately.  HEENT: Pupils equal, extraocular movements intact  Respiratory: Patient's speak in full sentences  and does not appear short of breath  Cardiovascular: Schultz lower extremity edema, non tender, Schultz erythema  Skin: Warm dry intact with Schultz signs of infection or rash on extremities or on axial skeleton.  Abdomen: Soft nontender  Neuro: Cranial nerves II through XII are intact, neurovascularly intact in all extremities with 2+ DTRs and 2+ pulses.  Lymph: Schultz lymphadenopathy of posterior or anterior cervical chain or axillae bilaterally.  Gait mild antalgic MSK:  Non tender with full range of motion and good stability and symmetric strength and tone of shoulders, elbows, wrist, hip, knee bilaterally.  Left ankle exam shows the patient has more tenderness over the calcaneal region.  Less tenderness over the Achilles.  Patient does have pain over the plantar aspect of the calcaneal region.  Neurovascular intact distally, mild breakdown the longitudinal and transverse arch   Impression and Recommendations:     This case required medical decision making of moderate complexity. The above documentation has been reviewed and is accurate and complete Lyndal Pulley, DO       Note: This dictation was prepared with Dragon dictation along with smaller phrase technology. Any transcriptional errors that result from this process are unintentional.

## 2019-02-11 ENCOUNTER — Ambulatory Visit: Payer: Self-pay

## 2019-02-11 ENCOUNTER — Encounter: Payer: Self-pay | Admitting: Family Medicine

## 2019-02-11 ENCOUNTER — Ambulatory Visit (INDEPENDENT_AMBULATORY_CARE_PROVIDER_SITE_OTHER): Payer: Self-pay | Admitting: Family Medicine

## 2019-02-11 VITALS — BP 128/60 | HR 73 | Ht 64.0 in | Wt 183.0 lb

## 2019-02-11 DIAGNOSIS — M79672 Pain in left foot: Secondary | ICD-10-CM

## 2019-02-11 DIAGNOSIS — M722 Plantar fascial fibromatosis: Secondary | ICD-10-CM

## 2019-02-11 NOTE — Patient Instructions (Signed)
**Note De-identified [place] Obfuscation** See me again in 3 weeks 

## 2019-02-11 NOTE — Assessment & Plan Note (Signed)
**Note De-Identified [place] Obfuscation** Patient did have PRP done on the area.  Hopefully patient will respond.  We discussed with her about the heat and Tylenol for the first 72 hours.  Discussed which activities to do which wants to avoid.  Discussed home exercises and icing regimen, which activities to do patient will continue the cam walker shortly and follow-up with me again in 3 weeks

## 2019-02-11 NOTE — Progress Notes (Signed)
**Note De-Identified [place] [facility]bfuscation** Felicia Schultz Sports Medicine [address], [place] [address] Phone: [phone] Subjective:   I Felicia Schultz am serving as a Education administrator for Dr. Hulan Saas.   CC: Foot pain follow-up  [id number]   02/10/2019 Continues to have more of the plantar fasciitis.  Discussed with patient in great length about icing regimen and home exercises, patient is failed all conservative therapy.  Did have an injection previously that gave her weeks of improvement.  At this point though I would like to try PRP.  Patient will be set up in the near future.  Follow-up with me with that and then 6 weeks after the injection likely.  02/11/2019 Felicia Schultz is a 68 y.o. female coming in with complaint of left foot pain. PRP.  Patient has not done well with conservative therapy at this time     Past Medical History:  Diagnosis Date  . Allergy   . Atrial flutter ([place])    typical appearing  . Cancer ([place])    basal ca - nose - 1985  . GERD (gastroesophageal reflux disease)   . History of diabetes mellitus 11/13/2015  . Hypertension   . Hypokalemia   . Osteoporosis 2000  . Paroxysmal atrial fibrillation ([facility])   . Personal history of colonic polyps - sessile serrated 11/23/2013   Past Surgical History:  Procedure Laterality Date  . APPENDECTOMY    . ATRIAL FLUTTER ABLATION N/A 09/05/2014   Procedure: ATRIAL FLUTTER ABLATION;  Surgeon: Thompson Grayer, MD;  Location: [facility] CATH LAB;  Service: Cardiovascular;  Laterality: N/A;  . CARPAL TUNNEL RELEASE Bilateral   . FOOT SURGERY Bilateral 2012   shorten bones in both feet  . GASTRIC BYPASS    . removal of ovary Right 1987  . SPINE SURGERY  2000/2012  . TOTAL ABDOMINAL HYSTERECTOMY  1999  . WRIST SURGERY Right 2018   Dr. Apolonio Schneiders -ganglion excision   Social History   Socioeconomic History  . Marital status: Married    Spouse name: Not on file  . Number of children: 0  . Years of education: Not on file  . Highest education level: Not on  file  Occupational History  . Occupation: Actuary: [employer]  . Financial resource strain: Not on file  . Food insecurity    Worry: Not on file    Inability: Not on file  . Transportation needs    Medical: Not on file    Non-medical: Not on file  Tobacco Use  . Smoking status: Never Smoker  . Smokeless tobacco: Never Used  Substance and Sexual Activity  . Alcohol use: No  . Drug use: No  . Sexual activity: Never    Birth control/protection: Abstinence  Lifestyle  . Physical activity    Days per week: Not on file    Minutes per session: Not on file  . Stress: Not on file  Relationships  . Social Herbalist on phone: Not on file    Gets together: Not on file    Attends religious service: Not on file    Active member of club or organization: Not on file    Attends meetings of clubs or organizations: Not on file    Relationship status: Not on file  Other Topics Concern  . Not on file  Social History Narrative   Married, no children   Engineer, drilling for [employer].   Lives in **Note De-Identified [place] Obfuscation** [place] Sch[place].   1-2 caffeine/day   Allergies  Allergen Reactions  . Amoxicillin-Pot Clavulanate Nausea Only    Severe nausea. Couldn't keep medication down  Severe nausea. Couldn't keep medication down  . Clarithromycin Hives  . Nitrofurantoin Other (See Comments) and Rash    Unknown Palms and hand itching, turned bright red per pt  . Sulfonamide Derivatives Other (See Comments)    Abdominal cramping, nausea  . Nitrofurantoin Macrocrystal Swelling  . Sulfa Antibiotics Nausea And Vomiting and Other (See Comments)    Unknown   Family History  Problem Relation Age of Onset  . Diabetes Mother   . Heart disease Mother   . Arthritis Mother   . Hearing loss Mother   . Hyperlipidemia Mother   . Hypertension Mother   . Kidney disease Mother   . Learning disabilities Mother   . Miscarriages / [place] Mother   . Heart  disease Father   . Hypertension Father   . Alcohol abuse Father   . Hyperlipidemia Father   . Emphysema Maternal Grandmother   . Cancer Maternal Grandmother        throat and lung(smoker)  . Heart disease Brother   . Cancer Brother        lung  . Colon cancer Maternal Grandfather   . Cancer Maternal Grandfather        colon  . Scleroderma Sister   . Early death Sister        schlederma  . Miscarriages / Stillbirths Sister   . Stroke Brother   . Emphysema Sister   . Diabetes Sister   . Hyperlipidemia Sister   . Hypertension Sister   . Mental illness Sister   . Miscarriages / Stillbirths Sister   . Stroke Sister   . Lung cancer Brother        smoker  . Diabetes Brother   . Diabetes Sister   . Pancreatic cancer Neg Hx   . Rectal cancer Neg Hx   . Stomach cancer Neg Hx     Current Outpatient Medications (Endocrine & Metabolic):  .  estrogens, conjugated, (PREMARIN) 0.625 MG tablet, Take 1 tablet (0.625 mg total) by mouth daily. .  predniSONE (DELTASONE) 50 MG tablet, Take 1 tablet daily for 5 days. Then take 1/2 tablet daily for 2 days.   Current Outpatient Medications (Respiratory):  .  albuterol (PROVENTIL HFA;VENTOLIN HFA) 108 (90 Base) MCG/ACT inhaler, Inhale 2 puffs into the lungs every 6 (six) hours as needed for wheezing or shortness of breath. Marland Kitchen  azelastine (ASTELIN) 0.1 % nasal spray, Place 1-2 sprays into both nostrils 2 (two) times daily. Use in each nostril as directed .  HYDROcodone-homatropine (HYCODAN) 5-1.5 MG/5ML syrup, Take 5 mLs by mouth every 8 (eight) hours as needed for cough.  Current Outpatient Medications (Analgesics):  .  meloxicam (MOBIC) 15 MG tablet, Take 1 tablet (15 mg total) by mouth daily.   Current Outpatient Medications (Other):  Marland Kitchen  AMBULATORY NON FORMULARY MEDICATION, Medication Name: Kneeling rolling walker Dx: [id number] .  Cholecalciferol (WEEKLY-D) 1.25 MG (50000 UT) capsule, Take 1 capsule (50,000 Units total) by mouth 2 (two) times a  week. .  citalopram (CELEXA) 20 MG tablet, Take 1 tablet (20 mg total) by mouth daily. .  cyclobenzaprine (FLEXERIL) 5 MG tablet, Take 1 tablet (5 mg total) by mouth 3 (three) times daily as needed for muscle spasms. Marland Kitchen  dicyclomine (BENTYL) 20 MG tablet, Take 1 tablet (20 mg total) by mouth every 6 (six) hours. ( **Note De-Identified [place] Obfuscation** Patient taking differently: Take 20 mg by mouth as needed. ) .  doxycycline (VIBRA-TABS) 100 MG tablet, Take 1 tablet (100 mg total) by mouth 2 (two) times daily. Marland Kitchen  gabapentin (NEURONTIN) 100 MG capsule, Take 2-3 capsules (200-300 mg total) by mouth at bedtime. May also take 1 capsule (100 mg total) 3 (three) times daily as needed. .  gabapentin (NEURONTIN) 300 MG capsule, Take 1 capsule, orally, up to TID or as directed .  Vitamin D, Ergocalciferol, (DRISDOL) 1.25 MG (50000 UT) CAPS capsule, Take 1 capsule (50,000 Units total) by mouth every 7 (seven) days.    Past medical history, social, surgical and family history all reviewed in electronic medical record.  No pertanent information unless stated regarding to the chief complaint.   Review of Systems:  No headache, visual changes, nausea, vomiting, diarrhea, constipation, dizziness, abdominal pain, skin rash, fevers, chills, night sweats, weight loss, swollen lymph nodes, body aches, joint swelling, muscle aches, chest pain, shortness of breath, mood changes.   Objective  Blood pressure 128/60, pulse 73, height 5\' 4"  (1.626 m), weight 183 lb (83 kg), SpO2 97 %.    General: No apparent distress alert and oriented x3 mood and affect normal, dressed appropriately.    Procedure note  Procedure: Real-time Ultrasound Guided Injection of left plantar fascia injection with PRP Device: GE Logiq Q7 Ultrasound guided injection is preferred based studies that show increased duration, increased effect, greater accuracy, decreased procedural pain, increased response rate, and decreased cost with ultrasound guided versus blind injection.   Verbal informed consent obtained.  Time-out conducted.  Noted no overlying erythema, induration, or other signs of local infection.  Skin prepped in a sterile fashion.  Local anesthesia: Topical Ethyl chloride.  With sterile technique and under real time ultrasound guidance: With a 21-gauge 2 inch needle injected with 0.5 cc of 0.5% Marcaine then injected 2 cc of pre-centrifuge PRP. Completed without difficulty  Pain immediately resolved sugge sting accurate placement of the medication.  Advised to call if fevers/chills, erythema, induration, drainage, or persistent bleeding.  Images permanently stored and available for review in the ultrasound unit.  Impression: Technically successful ultrasound guided injection.    Impression and Recommendations:     This case required medical decision making of moderate complexity. The above documentation has been reviewed and is accurate and complete Lyndal Pulley, DO       Note: This dictation was prepared with Dragon dictation along with smaller phrase technology. Any transcriptional errors that result from this process are unintentional.

## 2019-02-15 ENCOUNTER — Encounter: Payer: Self-pay | Admitting: Family Medicine

## 2019-02-15 ENCOUNTER — Ambulatory Visit: Payer: BLUE CROSS/BLUE SHIELD | Admitting: Family Medicine

## 2019-02-15 ENCOUNTER — Ambulatory Visit (INDEPENDENT_AMBULATORY_CARE_PROVIDER_SITE_OTHER): Payer: BLUE CROSS/BLUE SHIELD | Admitting: Family Medicine

## 2019-02-15 DIAGNOSIS — Z13220 Encounter for screening for lipoid disorders: Secondary | ICD-10-CM

## 2019-02-15 DIAGNOSIS — Z8639 Personal history of other endocrine, nutritional and metabolic disease: Secondary | ICD-10-CM

## 2019-02-15 DIAGNOSIS — E559 Vitamin D deficiency, unspecified: Secondary | ICD-10-CM | POA: Diagnosis not present

## 2019-02-15 DIAGNOSIS — M47812 Spondylosis without myelopathy or radiculopathy, cervical region: Secondary | ICD-10-CM | POA: Diagnosis not present

## 2019-02-15 DIAGNOSIS — N959 Unspecified menopausal and perimenopausal disorder: Secondary | ICD-10-CM

## 2019-02-15 DIAGNOSIS — J309 Allergic rhinitis, unspecified: Secondary | ICD-10-CM

## 2019-02-15 DIAGNOSIS — K529 Noninfective gastroenteritis and colitis, unspecified: Secondary | ICD-10-CM

## 2019-02-15 MED ORDER — DICYCLOMINE HCL 20 MG PO TABS: 20.0000 mg | ORAL_TABLET | Freq: Four times a day (QID) | ORAL | 5 refills | Status: DC

## 2019-02-15 MED ORDER — GABAPENTIN 100 MG PO CAPS: ORAL_CAPSULE | ORAL | 2 refills | Status: DC

## 2019-02-15 MED ORDER — GABAPENTIN 300 MG PO CAPS: ORAL_CAPSULE | ORAL | 2 refills | Status: DC

## 2019-02-15 MED ORDER — CYCLOBENZAPRINE HCL 5 MG PO TABS: 5.0000 mg | ORAL_TABLET | Freq: Three times a day (TID) | ORAL | 5 refills | Status: DC | PRN

## 2019-02-15 MED ORDER — DICLOFENAC SODIUM 2 % TD SOLN: TRANSDERMAL | 5 refills | Status: DC

## 2019-02-15 MED ORDER — ESTROGENS CONJUGATED 0.625 MG PO TABS: 0.6250 mg | ORAL_TABLET | Freq: Every day | ORAL | 1 refills | Status: DC

## 2019-02-15 MED ORDER — VITAMIN K2 100 MCG PO TABS: 100.0000 ug | ORAL_TABLET | Freq: Every day | ORAL | 5 refills | Status: DC

## 2019-02-15 MED ORDER — CITALOPRAM HYDROBROMIDE 40 MG PO TABS: 40.0000 mg | ORAL_TABLET | Freq: Every day | ORAL | 3 refills | Status: DC

## 2019-02-15 MED ORDER — AZELASTINE HCL 0.1 % NA SOLN: 1.0000 | Freq: Two times a day (BID) | NASAL | 11 refills | Status: DC

## 2019-02-15 MED ORDER — VITAMIN D (ERGOCALCIFEROL) 1.25 MG (50000 UNIT) PO CAPS: 50000.0000 [IU] | ORAL_CAPSULE | ORAL | 0 refills | Status: DC

## 2019-02-15 NOTE — Assessment & Plan Note (Signed)
**Note De-Identified [place] Obfuscation** Increase Celexa to 40 mg daily.  Discussed long-term side effects of Premarin use.  She is only using a couple of times monthly for severe symptoms.

## 2019-02-15 NOTE — Assessment & Plan Note (Signed)
**Note De-Identified [place] Obfuscation** Bentyl refilled.

## 2019-02-15 NOTE — Progress Notes (Signed)
**Note De-identified [place] Obfuscation**    **Note De-Identified [place] Obfuscation** Chief Complaint:  Tamryn Scull Eng is a 68 y.o. female who presents today for a virtual office visit with a chief complaint of vitamin D deficiency.  Assessment/Plan:  Vitamin D deficiency Continue vitamin D replacement.  Continue vitamin K2.  Patient will have vitamin D level checked later this week.  Arthritis of neck Continue management per sports medicine.  Refilled Pennsaid, Flexeril, and gabapentin today.  Allergic rhinitis Astelin refilled.  Frequent stools Bentyl refilled.  Postmenopausal symptoms Increase Celexa to 40 mg daily.  Discussed long-term side effects of Premarin use.  She is only using a couple of times monthly for severe symptoms.    Subjective:  HPI:  Her stable, chronic medical conditions are outlined below:   # Menopausal Symptoms - On celexa 20mg  daily and premarin 0.625mg  as needed - Uses premarin a few times per month  # Vitamin D Deficiency - On 50000IU weekly and doing well  # Allergic Rhinitis - Uses astelin nasal spray as needed  # Osteoarthritis / Neck Pain - Follows with sports medicine - On pennsaid 3-4 times daily as needed, flexeril 5mg  three times daily as needed, gabapentin 100-300mg  daily as needed, mobic 15mg  daily as needed  # Frequent Stools - Uses bentyl as needed.  ROS: Per HPI  PMH: She reports that she has never smoked. She has never used smokeless tobacco. She reports that she does not drink alcohol or use drugs.      Objective/Observations  Physical Exam: Gen: NAD, resting comfortably Pulm: Normal work of breathing Neuro: Grossly normal, moves all extremities Psych: Normal affect and thought content  No results found for this or any previous visit (from the past 24 hour(s)).   Virtual Visit [place] Video   I connected with Eudell Holzknecht Dupriest on 02/15/19 at  9:40 AM EDT by a video enabled telemedicine application and verified that I am speaking with the correct person using two identifiers. I discussed the limitations of  evaluation and management by telemedicine and the availability of in person appointments. The patient expressed understanding and agreed to proceed.   Patient location: Home Provider location: [place] participating in the virtual visit: Myself and Patient     Algis Greenhouse. Jerline Pain, MD 02/15/2019 11:58 AM

## 2019-02-15 NOTE — Assessment & Plan Note (Signed)
**Note De-Identified [place] Obfuscation** Astelin refilled.

## 2019-02-15 NOTE — Assessment & Plan Note (Signed)
**Note De-Identified Age Obfuscation** Continue vitamin D replacement.  Continue vitamin K2.  Patient will have vitamin D level checked later this week.

## 2019-02-15 NOTE — Assessment & Plan Note (Signed)
**Note De-Identified [place] Obfuscation** Continue management per sports medicine.  Refilled Pennsaid, Flexeril, and gabapentin today.

## 2019-02-18 ENCOUNTER — Other Ambulatory Visit (INDEPENDENT_AMBULATORY_CARE_PROVIDER_SITE_OTHER): Payer: BLUE CROSS/BLUE SHIELD

## 2019-02-18 ENCOUNTER — Other Ambulatory Visit: Payer: Self-pay

## 2019-02-18 ENCOUNTER — Encounter: Payer: Self-pay | Admitting: Family Medicine

## 2019-02-18 DIAGNOSIS — R739 Hyperglycemia, unspecified: Secondary | ICD-10-CM | POA: Insufficient documentation

## 2019-02-18 DIAGNOSIS — E559 Vitamin D deficiency, unspecified: Secondary | ICD-10-CM | POA: Diagnosis not present

## 2019-02-18 DIAGNOSIS — J309 Allergic rhinitis, unspecified: Secondary | ICD-10-CM

## 2019-02-18 DIAGNOSIS — Z8639 Personal history of other endocrine, nutritional and metabolic disease: Secondary | ICD-10-CM

## 2019-02-18 DIAGNOSIS — Z13220 Encounter for screening for lipoid disorders: Secondary | ICD-10-CM | POA: Diagnosis not present

## 2019-02-18 LAB — LIPID PANEL
Cholesterol: 160 mg/dL (ref 0–200)
HDL: 75.2 mg/dL (ref >39.00)
LDL Cholesterol: 73 mg/dL (ref 0–99)
NonHDL: 84.37
Total CHOL/HDL Ratio: 2
Triglycerides: 56 mg/dL (ref 0.0–149.0)
VLDL: 11.2 mg/dL (ref 0.0–40.0)

## 2019-02-18 LAB — COMPREHENSIVE METABOLIC PANEL
ALT: 13 U/L (ref 0–35)
AST: 16 U/L (ref 0–37)
Albumin: 4 g/dL (ref 3.5–5.2)
Alkaline Phosphatase: 86 U/L (ref 39–117)
BUN: 19 mg/dL (ref 6–23)
CO2: 28 mEq/L (ref 19–32)
Calcium: 9.4 mg/dL (ref 8.4–10.5)
Chloride: 106 mEq/L (ref 96–112)
Creatinine, Ser: 0.69 mg/dL (ref 0.40–1.20)
GFR: 85.15 mL/min (ref >60.00)
Glucose, Bld: 97 mg/dL (ref 70–99)
Potassium: 3.9 mEq/L (ref 3.5–5.1)
Sodium: 141 mEq/L (ref 135–145)
Total Bilirubin: 0.7 mg/dL (ref 0.2–1.2)
Total Protein: 6.3 g/dL (ref 6.0–8.3)

## 2019-02-18 LAB — CBC
HCT: 39.4 % (ref 36.0–46.0)
Hemoglobin: 12.9 g/dL (ref 12.0–15.0)
MCHC: 32.8 g/dL (ref 30.0–36.0)
MCV: 91.1 fl (ref 78.0–100.0)
Platelets: 209 10*3/uL (ref 150.0–400.0)
RBC: 4.32 Mil/uL (ref 3.87–5.11)
RDW: 14.2 % (ref 11.5–15.5)
WBC: 5.1 10*3/uL (ref 4.0–10.5)

## 2019-02-18 LAB — VITAMIN D 25 HYDROXY (VIT D DEFICIENCY, FRACTURES): VITD: 39.23 ng/mL (ref 30.00–100.00)

## 2019-02-18 LAB — TSH: TSH: 1.31 u[IU]/mL (ref 0.35–4.50)

## 2019-02-18 LAB — HEMOGLOBIN A1C: Hgb A1c MFr Bld: 5.9 % (ref 4.6–6.5)

## 2019-02-18 NOTE — Progress Notes (Signed)
**Note De-Identified [place] Obfuscation** Please inform patient of the following:  Blood sugar (A1c) is mildly elevated but everything else looks good.  She does not need to start any additional medications but would like for her to work on diet and exercise and we can recheck in a year or so.  All of her other labs are NORMAL.  Algis Greenhouse. Jerline Pain, MD 02/18/2019 4:15 PM

## 2019-02-25 ENCOUNTER — Encounter: Payer: Self-pay | Admitting: Family Medicine

## 2019-03-09 ENCOUNTER — Ambulatory Visit (INDEPENDENT_AMBULATORY_CARE_PROVIDER_SITE_OTHER): Payer: BLUE CROSS/BLUE SHIELD | Admitting: Family Medicine

## 2019-03-09 ENCOUNTER — Encounter: Payer: Self-pay | Admitting: Family Medicine

## 2019-03-09 ENCOUNTER — Other Ambulatory Visit: Payer: Self-pay

## 2019-03-09 DIAGNOSIS — M722 Plantar fascial fibromatosis: Secondary | ICD-10-CM

## 2019-03-09 NOTE — Progress Notes (Signed)
**Note De-Identified Marty Obfuscation** [facility] [address], [place] [address] Phone: [phone] Subjective:   I [name] am serving as a Education administrator for Dr. Hulan Saas.  I'm seeing this patient by the request  of:    CC: Foot pain follow-up  [id number]   02/11/2019 Patient did have PRP done on the area.  Hopefully patient will respond.  We discussed with her about the heat and Tylenol for the first 72 hours.  Discussed which activities to do which wants to avoid.  Discussed home exercises and icing regimen, which activities to do patient will continue the cam walker shortly and follow-up with me again in 3 weeks  03/09/2019 Felicia Schultz is a 68 y.o. female coming in with complaint of left foot pain. PRP follow up. States the foot feels 1,000% better. Believes she should start back exercising. Still having a little heel pain.  Patient states that she can do anything she wants at this point.  Very happy with the results     Past Medical History:  Diagnosis Date  . Allergy   . Atrial flutter ([facility])    typical appearing  . Cancer ([facility])    basal ca - nose - 1985  . GERD (gastroesophageal reflux disease)   . History of diabetes mellitus 11/13/2015  . Hypertension   . Hypokalemia   . Osteoporosis 2000  . Paroxysmal atrial fibrillation ([facility])   . Personal history of colonic polyps - sessile serrated 11/23/2013   Past Surgical History:  Procedure Laterality Date  . APPENDECTOMY    . ATRIAL FLUTTER ABLATION N/A 09/05/2014   Procedure: ATRIAL FLUTTER ABLATION;  Surgeon: Thompson Grayer, MD;  Location: [facility] CATH LAB;  Service: Cardiovascular;  Laterality: N/A;  . CARPAL TUNNEL RELEASE Bilateral   . FOOT SURGERY Bilateral 2012   shorten bones in both feet  . GASTRIC BYPASS    . removal of ovary Right 1987  . SPINE SURGERY  2000/2012  . TOTAL ABDOMINAL HYSTERECTOMY  1999  . WRIST SURGERY Right 2018   Dr. Apolonio Schneiders -ganglion excision   Social History   Socioeconomic History  .  Marital status: Married    Spouse name: Not on file  . Number of children: 0  . Years of education: Not on file  . Highest education level: Not on file  Occupational History  . Occupation: Actuary: [employer]  . Financial resource strain: Not on file  . Food insecurity    Worry: Not on file    Inability: Not on file  . Transportation needs    Medical: Not on file    Non-medical: Not on file  Tobacco Use  . Smoking status: Never Smoker  . Smokeless tobacco: Never Used  Substance and Sexual Activity  . Alcohol use: No  . Drug use: No  . Sexual activity: Never    Birth control/protection: Abstinence  Lifestyle  . Physical activity    Days per week: Not on file    Minutes per session: Not on file  . Stress: Not on file  Relationships  . Social Herbalist on phone: Not on file    Gets together: Not on file    Attends religious service: Not on file    Active member of club or organization: Not on file    Attends meetings of clubs or organizations: Not on file    Relationship status: Not on file  Other **Note De-Identified Janowski Obfuscation** Topics Concern  . 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   1-2 caffeine/day   Allergies  Allergen Reactions  . Amoxicillin-Pot Clavulanate Nausea Only    Severe nausea. Couldn't keep medication down  Severe nausea. Couldn't keep medication down  . Clarithromycin Hives  . Nitrofurantoin Other (See Comments) and Rash    Unknown Palms and hand itching, turned bright red per pt  . Sulfonamide Derivatives Other (See Comments)    Abdominal cramping, nausea  . Nitrofurantoin Macrocrystal Swelling  . Sulfa Antibiotics Nausea And Vomiting and Other (See Comments)    Unknown   Family History  Problem Relation Age of Onset  . Diabetes Mother   . Heart disease Mother   . Arthritis Mother   . Hearing loss Mother   .  Hyperlipidemia Mother   . Hypertension Mother   . Kidney disease Mother   . Learning disabilities Mother   . Miscarriages / [place] Mother   . Heart disease Father   . Hypertension Father   . Alcohol abuse Father   . Hyperlipidemia Father   . Emphysema Maternal Grandmother   . Cancer Maternal Grandmother        throat and lung(smoker)  . Heart disease Brother   . Cancer Brother        lung  . Colon cancer Maternal Grandfather   . Cancer Maternal Grandfather        colon  . Scleroderma Sister   . Early death Sister        schlederma  . Miscarriages / Stillbirths Sister   . Stroke Brother   . Emphysema Sister   . Diabetes Sister   . Hyperlipidemia Sister   . Hypertension Sister   . Mental illness Sister   . Miscarriages / Stillbirths Sister   . Stroke Sister   . Lung cancer Brother        smoker  . Diabetes Brother   . Diabetes Sister   . Pancreatic cancer Neg Hx   . Rectal cancer Neg Hx   . Stomach cancer Neg Hx     Current Outpatient Medications (Endocrine & Metabolic):  .  estrogens, conjugated, (PREMARIN) 0.625 MG tablet, Take 1 tablet (0.625 mg total) by mouth daily.   Current Outpatient Medications (Respiratory):  .  azelastine (ASTELIN) 0.1 % nasal spray, Place 1-2 sprays into both nostrils 2 (two) times daily. Use in each nostril as directed  Current Outpatient Medications (Analgesics):  .  meloxicam (MOBIC) 15 MG tablet, Take 1 tablet (15 mg total) by mouth daily.   Current Outpatient Medications (Other):  Marland Kitchen  AMBULATORY NON FORMULARY MEDICATION, Medication Name: Kneeling rolling walker Dx: M67.88 .  citalopram (CELEXA) 40 MG tablet, Take 1 tablet (40 mg total) by mouth daily. .  cyclobenzaprine (FLEXERIL) 5 MG tablet, Take 1 tablet (5 mg total) by mouth 3 (three) times daily as needed for muscle spasms. .  Diclofenac Sodium 2 % SOLN, Use 3-4 times per day as needed. .  dicyclomine (BENTYL) 20 MG tablet, Take 1 tablet (20 mg total) by mouth every 6  (six) hours. .  gabapentin (NEURONTIN) 100 MG capsule, Take 2-3 capsules (200-300 mg total) by mouth at bedtime. May also take 1 capsule (100 mg total) 3 (three) times daily as needed. .  gabapentin (NEURONTIN) 300 MG capsule, Take 1 capsule, orally, up to TID or as directed .  Menaquinone-7 ( **Note De-Identified Janney Obfuscation** VITAMIN K2 PO), Take by mouth. .  Menatetrenone (VITAMIN K2) 100 MCG TABS, Take 100 mcg by mouth daily. .  Vitamin D, Ergocalciferol, (DRISDOL) 1.25 MG (50000 UT) CAPS capsule, Take 1 capsule (50,000 Units total) by mouth every 7 (seven) days.    Past medical history, social, surgical and family history all reviewed in electronic medical record.  No pertanent information unless stated regarding to the chief complaint.   Review of Systems:  No headache, visual changes, nausea, vomiting, diarrhea, constipation, dizziness, abdominal pain, skin rash, fevers, chills, night sweats, weight loss, swollen lymph nodes, body aches, joint swelling, muscle aches, chest pain, shortness of breath, mood changes.   Objective  Blood pressure (!) 146/80, pulse 60, height 5\' 4"  (1.626 m), weight 183 lb (83 kg), SpO2 96 %.    General: No apparent distress alert and oriented x3 mood and affect normal, dressed appropriately.  HEENT: Pupils equal, extraocular movements intact  Respiratory: Patient's speak in full sentences and does not appear short of breath  Cardiovascular: No lower extremity edema, non tender, no erythema  Skin: Warm dry intact with no signs of infection or rash on extremities or on axial skeleton.  Abdomen: Soft nontender  Neuro: Cranial nerves II through XII are intact, neurovascularly intact in all extremities with 2+ DTRs and 2+ pulses.  Lymph: No lymphadenopathy of posterior or anterior cervical chain or axillae bilaterally.  Gait normal with good balance and coordination.  MSK:  Non tender with full range of motion and good stability and symmetric strength and tone of shoulders, elbows, wrist, hip,  knee bilaterally.   Foot exam still shows the breakdown the longitudinal arch.  Patient is nontender of the calcaneal area anymore.  Full range of motion of the ankle.    Impression and Recommendations:     This case required medical decision making of moderate complexity. The above documentation has been reviewed and is accurate and complete Lyndal Pulley, DO       Note: This dictation was prepared with Dragon dictation along with smaller phrase technology. Any transcriptional errors that result from this process are unintentional.

## 2019-03-09 NOTE — Patient Instructions (Addendum)
**Note De-identified Wolanski Obfuscation** Good to see you [phone] 

## 2019-03-10 ENCOUNTER — Encounter: Payer: Self-pay | Admitting: Family Medicine

## 2019-03-10 NOTE — Assessment & Plan Note (Signed)
**Note De-Identified Caliendo Obfuscation** Patient 3 weeks out since the PRP.  Is nearly pain-free at the moment.  I believe patient will do well with conservative therapy.  Follow-up with me as needed

## 2019-04-07 DIAGNOSIS — E119 Type 2 diabetes mellitus without complications: Secondary | ICD-10-CM | POA: Diagnosis not present

## 2019-04-07 DIAGNOSIS — H43311 Vitreous membranes and strands, right eye: Secondary | ICD-10-CM | POA: Diagnosis not present

## 2019-04-07 DIAGNOSIS — H43813 Vitreous degeneration, bilateral: Secondary | ICD-10-CM | POA: Diagnosis not present

## 2019-04-07 DIAGNOSIS — H04123 Dry eye syndrome of bilateral lacrimal glands: Secondary | ICD-10-CM | POA: Diagnosis not present

## 2019-05-03 ENCOUNTER — Other Ambulatory Visit: Payer: Self-pay

## 2019-05-03 ENCOUNTER — Encounter: Payer: Self-pay | Admitting: Family Medicine

## 2019-05-03 ENCOUNTER — Telehealth: Payer: Self-pay | Admitting: Physical Therapy

## 2019-05-03 MED ORDER — DICLOFENAC SODIUM 2 % EX SOLN: 1.0000 | Freq: Three times a day (TID) | CUTANEOUS | 5 refills | Status: DC | PRN

## 2019-05-03 NOTE — Telephone Encounter (Signed)
**Note De-identified Tuckett Obfuscation** Rx sent to pharmacy   

## 2019-05-03 NOTE — Telephone Encounter (Signed)
**Note De-Identified Kocourek Obfuscation** Felicia Schultz, I believe this should go to you all since Dr. Tamala Julian saw her in September but not sure. Thanks, [place] Copied from [employer] [phone]. Topic: General - Call Back - No Documentation >> May 03, 2019 12:46 PM Erick Blinks wrote: Reason for CRM: Pharmacy called, pt came looking for Rx for "Pennsaid." Pt states this was called in for her in September. Please advise, not listed on her med list.

## 2019-05-17 DIAGNOSIS — J069 Acute upper respiratory infection, unspecified: Secondary | ICD-10-CM | POA: Diagnosis not present

## 2019-05-17 DIAGNOSIS — Z20828 Contact with and (suspected) exposure to other viral communicable diseases: Secondary | ICD-10-CM | POA: Diagnosis not present

## 2019-06-29 ENCOUNTER — Encounter: Payer: Self-pay | Admitting: Family Medicine

## 2019-06-29 DIAGNOSIS — L738 Other specified follicular disorders: Secondary | ICD-10-CM | POA: Diagnosis not present

## 2019-06-29 DIAGNOSIS — L820 Inflamed seborrheic keratosis: Secondary | ICD-10-CM | POA: Diagnosis not present

## 2019-06-29 DIAGNOSIS — L814 Other melanin hyperpigmentation: Secondary | ICD-10-CM | POA: Diagnosis not present

## 2019-06-29 DIAGNOSIS — D225 Melanocytic nevi of trunk: Secondary | ICD-10-CM | POA: Diagnosis not present

## 2019-06-29 DIAGNOSIS — D485 Neoplasm of uncertain behavior of skin: Secondary | ICD-10-CM | POA: Diagnosis not present

## 2019-06-29 DIAGNOSIS — D1801 Hemangioma of skin and subcutaneous tissue: Secondary | ICD-10-CM | POA: Diagnosis not present

## 2019-08-18 ENCOUNTER — Ambulatory Visit: Payer: BLUE CROSS/BLUE SHIELD | Admitting: Family Medicine

## 2019-08-23 DIAGNOSIS — H524 Presbyopia: Secondary | ICD-10-CM | POA: Diagnosis not present

## 2019-08-30 ENCOUNTER — Other Ambulatory Visit: Payer: Self-pay | Admitting: Family Medicine

## 2019-08-30 ENCOUNTER — Ambulatory Visit (INDEPENDENT_AMBULATORY_CARE_PROVIDER_SITE_OTHER): Payer: BLUE CROSS/BLUE SHIELD | Admitting: Family Medicine

## 2019-08-30 DIAGNOSIS — N959 Unspecified menopausal and perimenopausal disorder: Secondary | ICD-10-CM | POA: Diagnosis not present

## 2019-08-30 DIAGNOSIS — N644 Mastodynia: Secondary | ICD-10-CM

## 2019-08-30 DIAGNOSIS — E559 Vitamin D deficiency, unspecified: Secondary | ICD-10-CM | POA: Diagnosis not present

## 2019-08-30 DIAGNOSIS — J309 Allergic rhinitis, unspecified: Secondary | ICD-10-CM

## 2019-08-30 DIAGNOSIS — M47812 Spondylosis without myelopathy or radiculopathy, cervical region: Secondary | ICD-10-CM

## 2019-08-30 MED ORDER — VITAMIN D (ERGOCALCIFEROL) 1.25 MG (50000 UNIT) PO CAPS: 50000.0000 [IU] | ORAL_CAPSULE | ORAL | 0 refills | Status: DC

## 2019-08-30 MED ORDER — GABAPENTIN 100 MG PO CAPS: ORAL_CAPSULE | ORAL | 2 refills | Status: DC

## 2019-08-30 MED ORDER — MONTELUKAST SODIUM 10 MG PO TABS: 10.0000 mg | ORAL_TABLET | Freq: Every day | ORAL | 3 refills | Status: DC

## 2019-08-30 MED ORDER — GABAPENTIN 300 MG PO CAPS: ORAL_CAPSULE | ORAL | 2 refills | Status: DC

## 2019-08-30 MED ORDER — ESTROGENS CONJUGATED 0.625 MG PO TABS: 0.6250 mg | ORAL_TABLET | Freq: Every day | ORAL | 1 refills | Status: DC

## 2019-08-30 MED ORDER — AZELASTINE HCL 0.1 % NA SOLN: 1.0000 | Freq: Two times a day (BID) | NASAL | 11 refills | Status: DC

## 2019-08-30 MED ORDER — CYCLOBENZAPRINE HCL 5 MG PO TABS: 5.0000 mg | ORAL_TABLET | Freq: Three times a day (TID) | ORAL | 5 refills | Status: DC | PRN

## 2019-08-30 MED ORDER — MELOXICAM 15 MG PO TABS: 15.0000 mg | ORAL_TABLET | Freq: Every day | ORAL | 0 refills | Status: DC

## 2019-08-30 MED ORDER — CITALOPRAM HYDROBROMIDE 40 MG PO TABS: 40.0000 mg | ORAL_TABLET | Freq: Every day | ORAL | 3 refills | Status: DC

## 2019-08-30 NOTE — Progress Notes (Signed)
**Note De-identified Servantes Obfuscation**   **Note De-Identified Erker Obfuscation** Felicia Schultz is a 69 y.o. female who presents today for a virtual office visit.  Assessment/Plan:  Chronic Problems Addressed Today: Vitamin D deficiency Refill vitamin D supplementation.  Check vitamin D next blood draw.  Allergic rhinitis Worsened despite over-the-counter medications.  We will continue Astelin.  Start Singulair 10 mg daily.  Postmenopausal symptoms Stable.  Continue Celexa 40 mg daily.  She is only using Premarin very sparingly.  Arthritis of neck Continue management per sports medicine.  Will refill Flexeril, gabapentin, and meloxicam today.  Preventative Healthcare Order for mammogram placed today.  She will follow-up in about 6 months for CPE.    Subjective:  HPI:  See A/p.       Objective/Observations  Physical Exam: Gen: NAD, resting comfortably Pulm: Normal work of breathing Neuro: Grossly normal, moves all extremities Psych: Normal affect and thought content  Virtual Visit [place] Video   I connected with Felicia Schultz on 08/30/19 at  4:00 PM EDT by a video enabled telemedicine application and verified that I am speaking with the correct person using two identifiers. The limitations of evaluation and management by telemedicine and the availability of in person appointments were discussed. The patient expressed understanding and agreed to proceed.   Patient location: Home Provider location: [place] participating in the virtual visit: Myself and Patient     Algis Greenhouse. Jerline Pain, MD 08/30/2019 2:27 PM

## 2019-08-30 NOTE — Assessment & Plan Note (Signed)
**Note De-Identified Mellor Obfuscation** Stable.  Continue Celexa 40 mg daily.  She is only using Premarin very sparingly.

## 2019-08-30 NOTE — Assessment & Plan Note (Signed)
**Note De-Identified Jonsson Obfuscation** Refill vitamin D supplementation.  Check vitamin D next blood draw.

## 2019-08-30 NOTE — Assessment & Plan Note (Signed)
**Note De-Identified Steinkamp Obfuscation** Continue management per sports medicine.  Will refill Flexeril, gabapentin, and meloxicam today.

## 2019-08-30 NOTE — Assessment & Plan Note (Signed)
**Note De-Identified Zacarias Obfuscation** Worsened despite over-the-counter medications.  We will continue Astelin.  Start Singulair 10 mg daily.

## 2019-09-16 ENCOUNTER — Ambulatory Visit: Payer: BLUE CROSS/BLUE SHIELD

## 2019-09-16 ENCOUNTER — Ambulatory Visit
Admission: RE | Admit: 2019-09-16 | Discharge: 2019-09-16 | Disposition: A | Discharge status: Home / Self Care | Payer: BLUE CROSS/BLUE SHIELD | Source: Ambulatory Visit | Attending: Family Medicine | Admitting: Family Medicine

## 2019-09-16 ENCOUNTER — Other Ambulatory Visit: Payer: Self-pay

## 2019-09-16 DIAGNOSIS — R928 Other abnormal and inconclusive findings on diagnostic imaging of breast: Secondary | ICD-10-CM | POA: Diagnosis not present

## 2019-09-16 DIAGNOSIS — Z853 Personal history of malignant neoplasm of breast: Secondary | ICD-10-CM | POA: Diagnosis not present

## 2019-09-16 DIAGNOSIS — N644 Mastodynia: Secondary | ICD-10-CM

## 2019-11-27 IMAGING — DX DG ANKLE COMPLETE 3+V*L*
3 series · 3 of 3 positions shown · non-contrast
Comparison: None

CLINICAL DATA: Pain and swelling LEFT foot and ankle for 1 year, no
known injury

EXAM:
LEFT ANKLE COMPLETE - 3+ VIEW

[ankle ap]
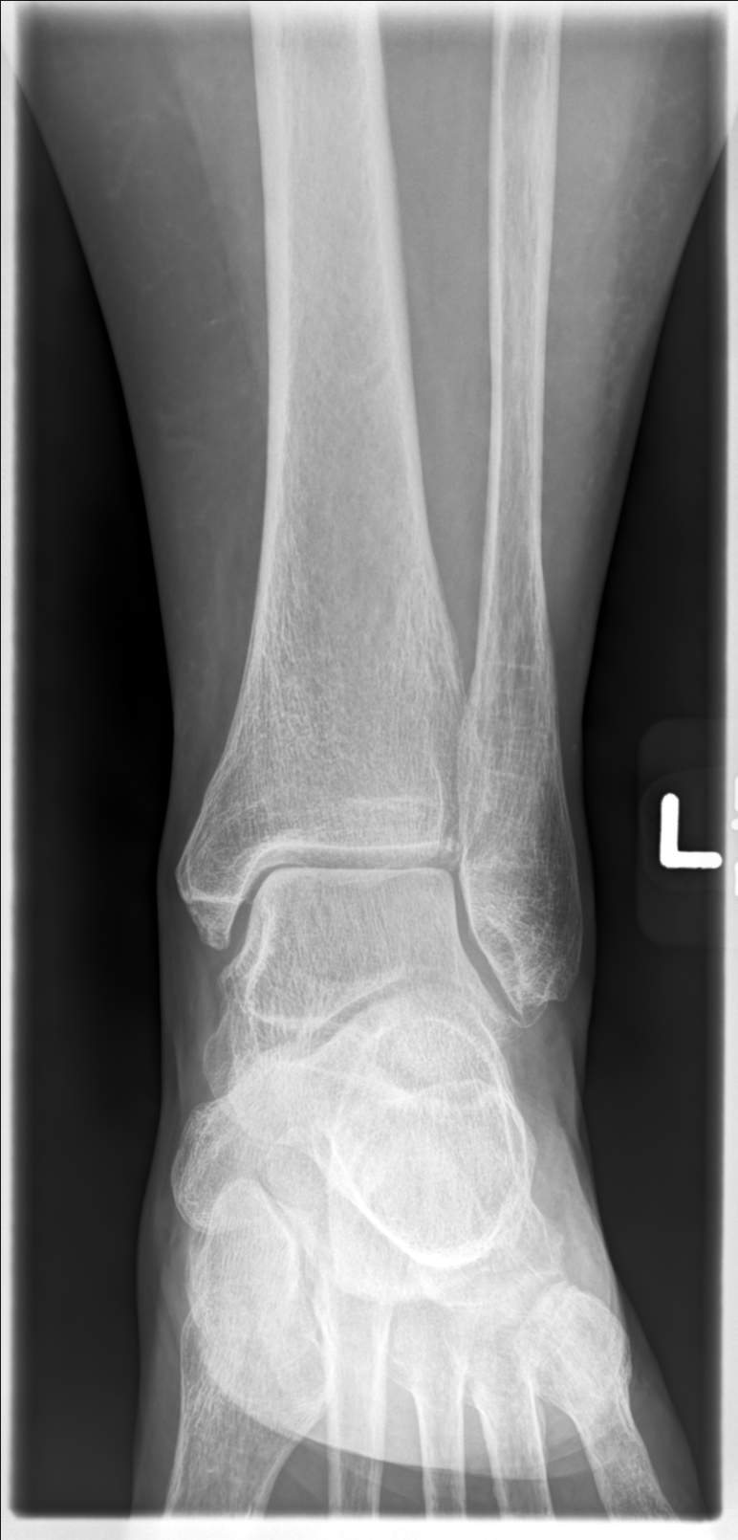

[ankle oblique]
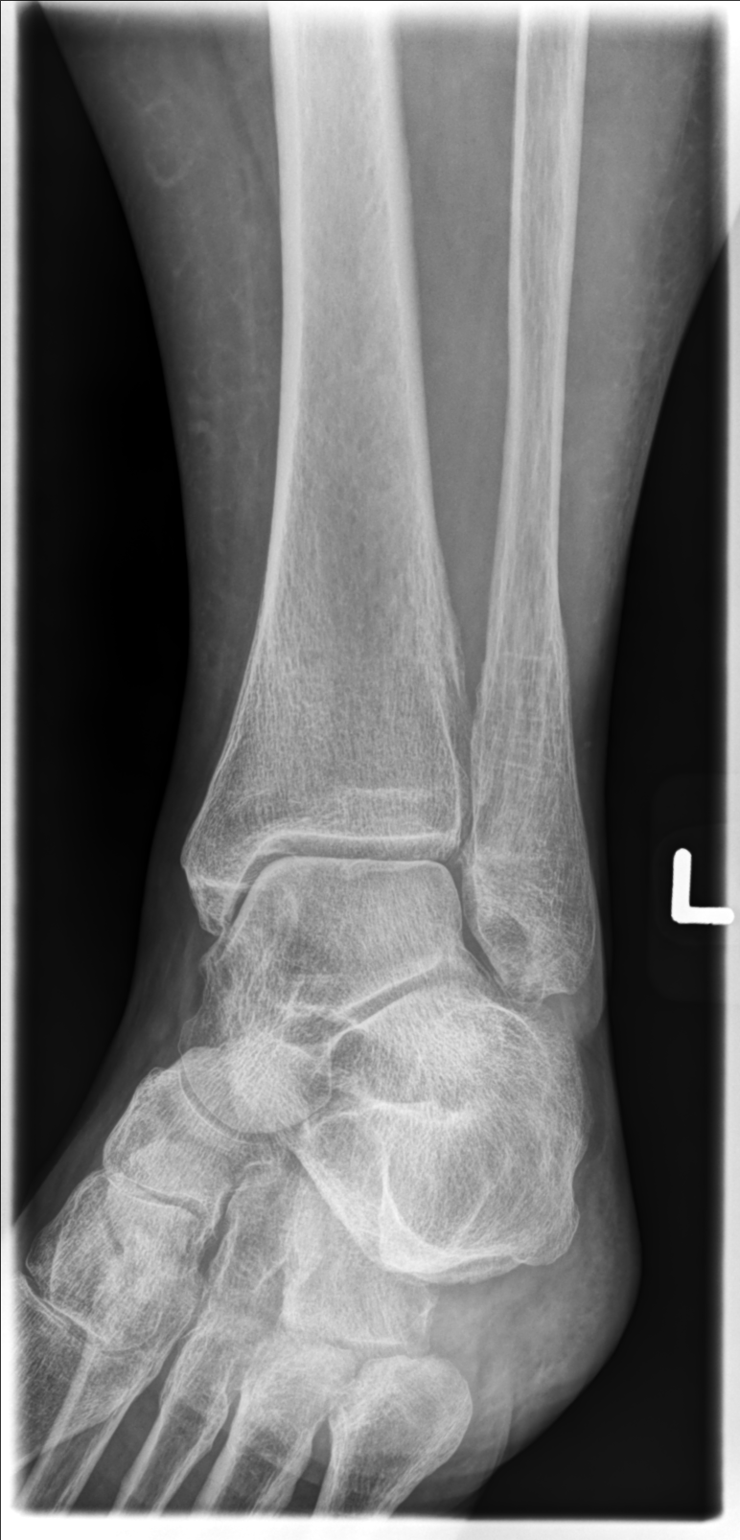

[ankle lat]
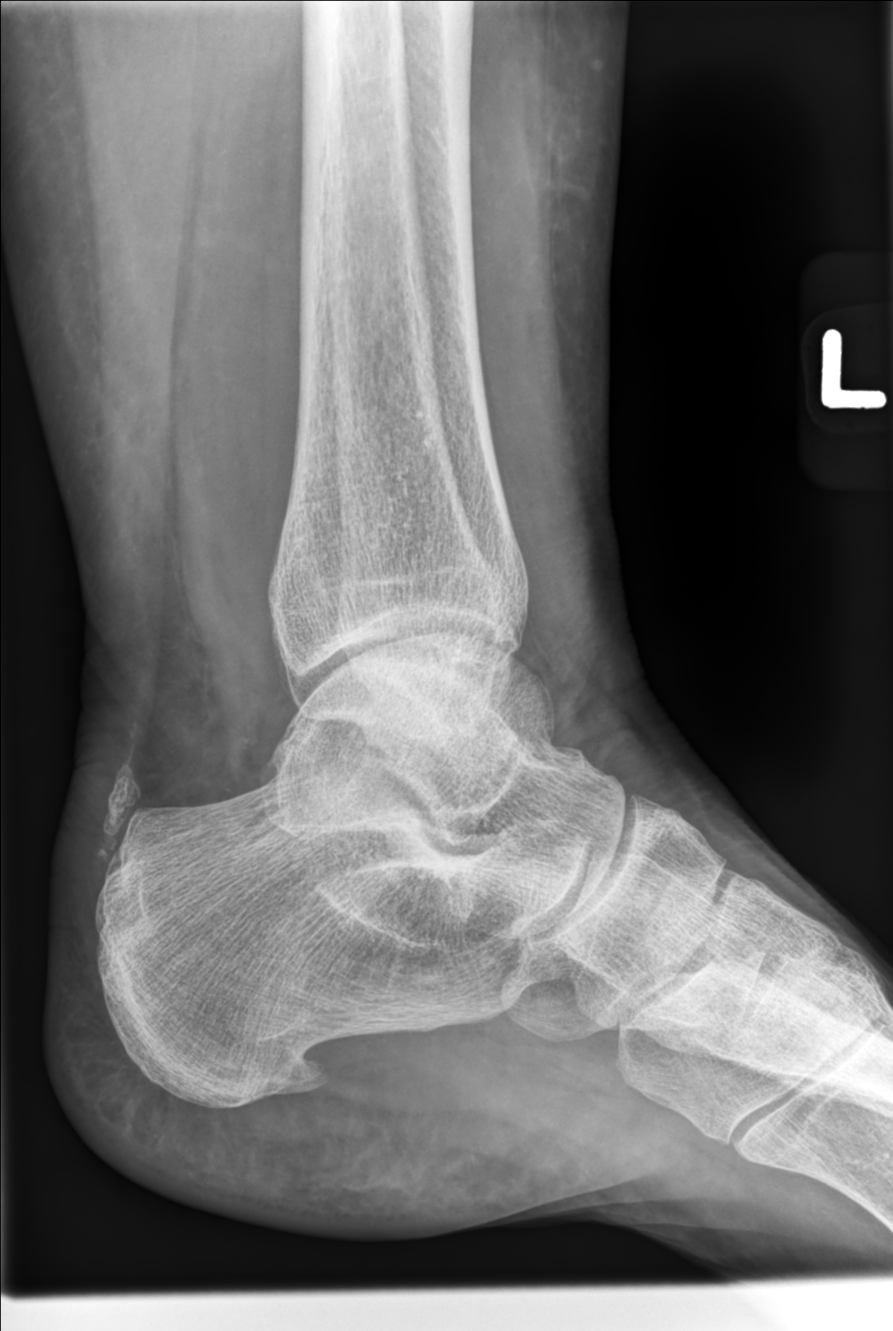

[3 of 3 positions shown; findings below may reference images not displayed]

FINDINGS: Osseous demineralization.

Joint spaces preserved.

No acute fracture, dislocation, or bone destruction.

Small plantar calcaneal spur.

Calcification identified at the distal Achilles tendon, could
reflect sequela of prior injury or calcific tendinitis.
IMPRESSION: No acute osseous abnormalities.

Small plantar calcaneal spur.

Distal Achilles tendon calcification question prior injury versus
calcific tendinitis.

## 2019-11-27 IMAGING — DX DG FOOT COMPLETE 3+V*L*
3 series · 3 of 3 positions shown · non-contrast
Comparison: None

CLINICAL DATA: Pain and swelling in LEFT foot and ankle for 1 year,
no known injury, pain from medial metatarsals to ankle

EXAM:
LEFT FOOT - COMPLETE 3+ VIEW

[foot dp]
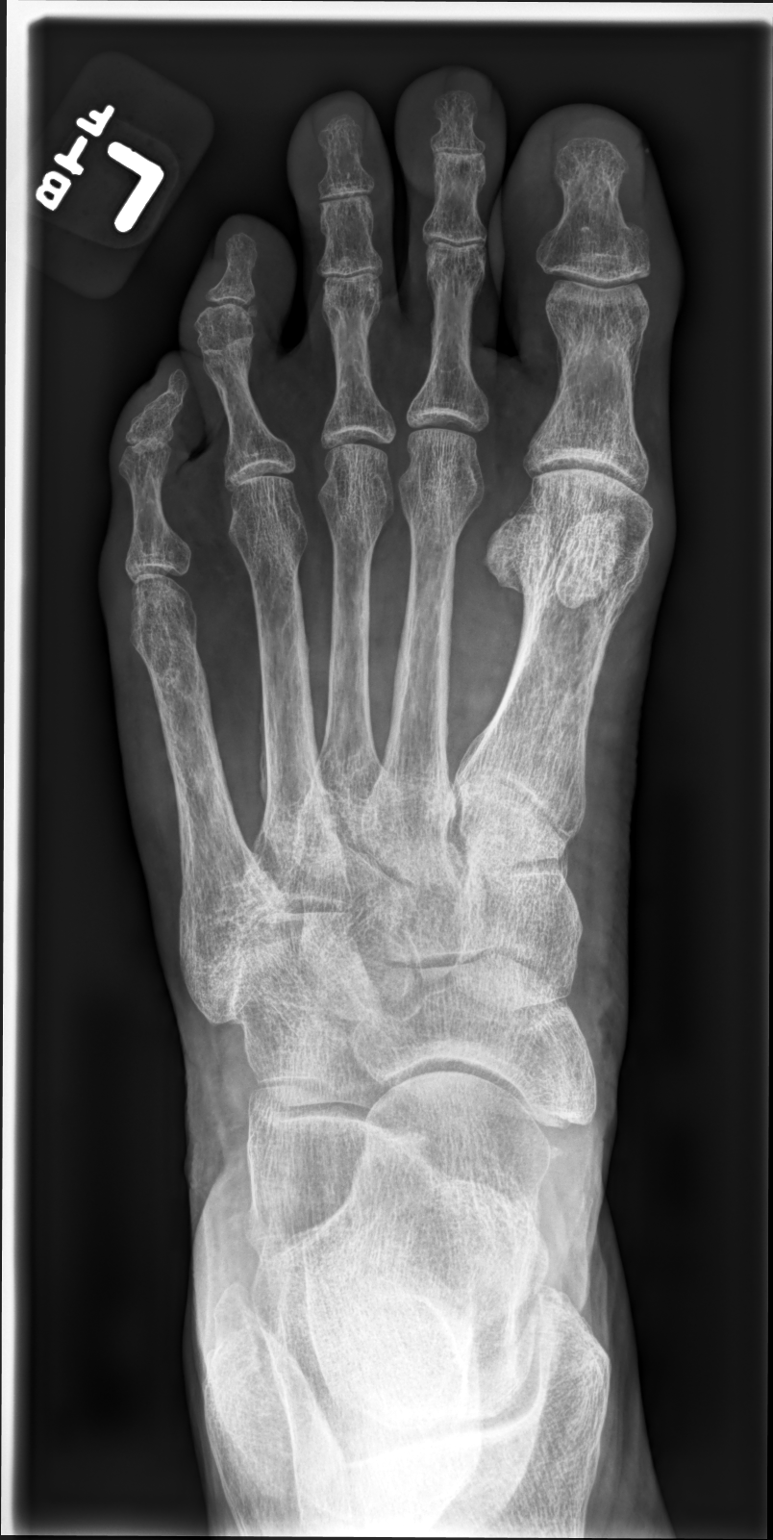

[foot oblique]
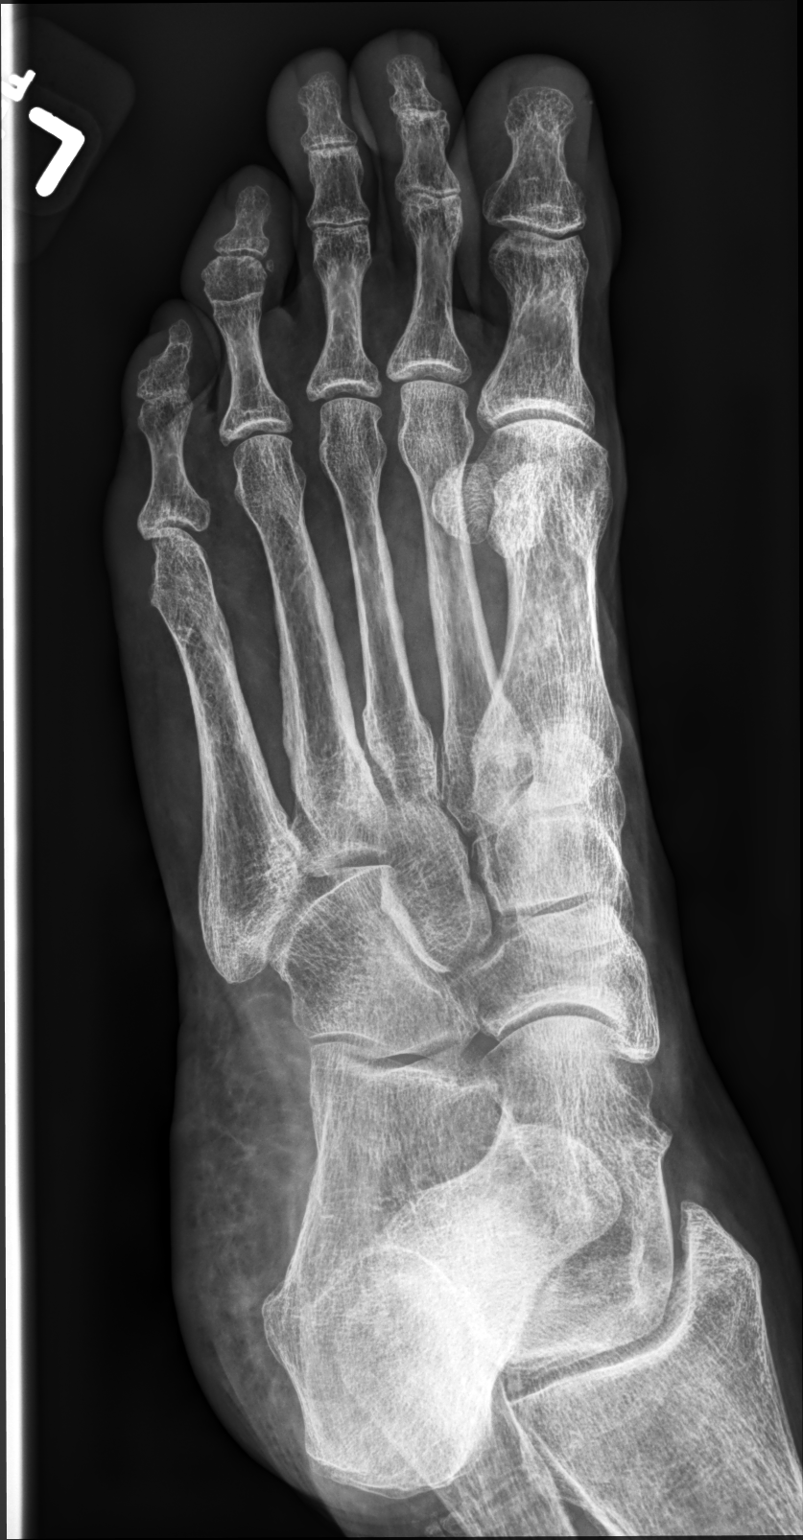

[foot lat]
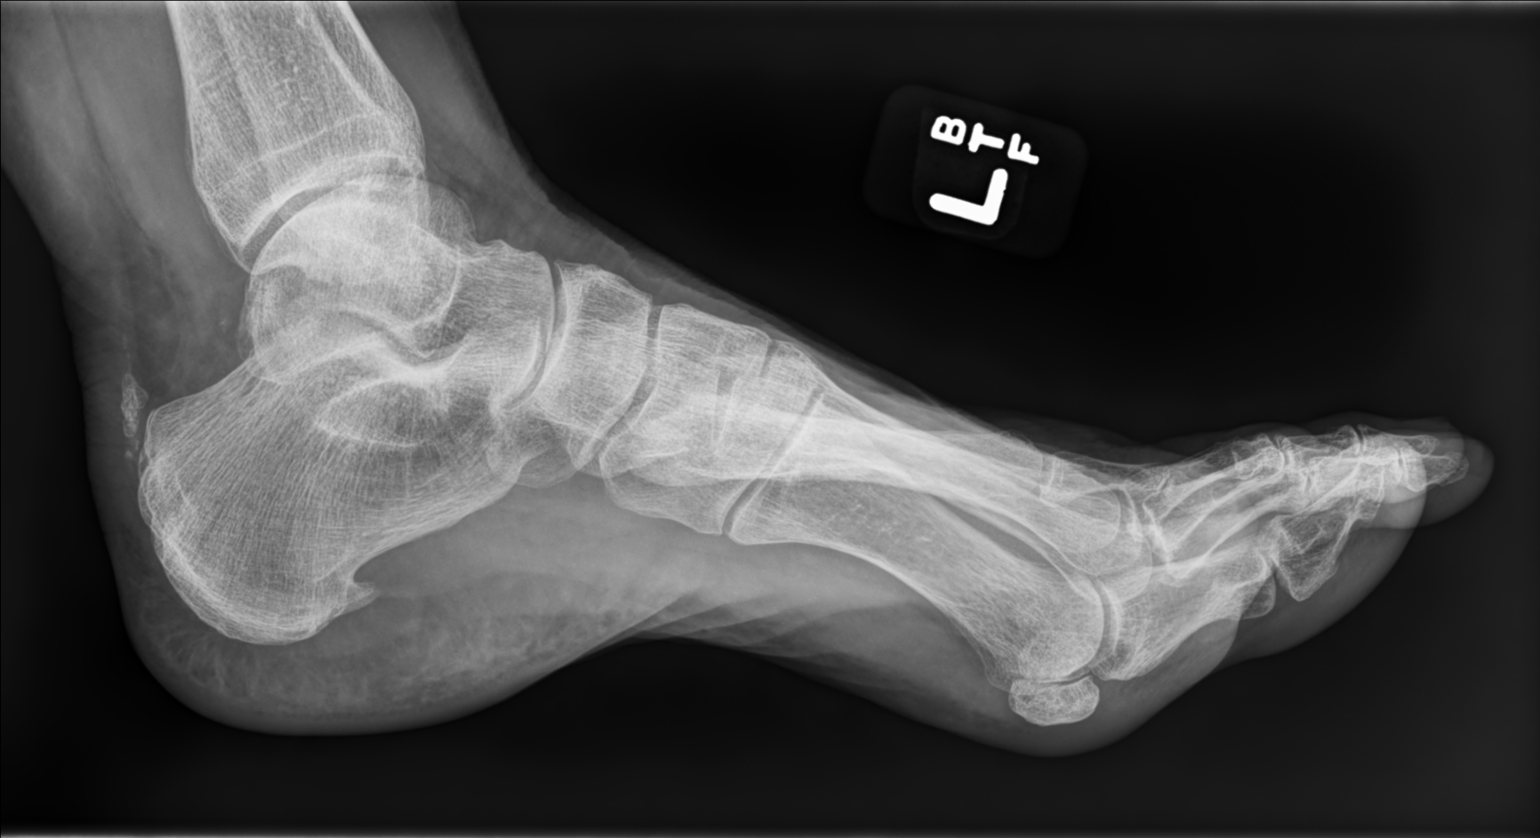

[3 of 3 positions shown; findings below may reference images not displayed]

FINDINGS: Osseous demineralization.

Mild joint space narrowing at the first MTP joint.

Joint spaces otherwise preserved.

No acute fracture, dislocation, or bone destruction.

Plantar calcaneal spur.

Calcification at the distal Achilles tendon.
IMPRESSION: Osseous demineralization with mild degenerative changes of the first
MTP joint.

## 2020-01-13 ENCOUNTER — Other Ambulatory Visit: Payer: Self-pay | Admitting: Family Medicine

## 2020-03-28 DIAGNOSIS — H04123 Dry eye syndrome of bilateral lacrimal glands: Secondary | ICD-10-CM | POA: Insufficient documentation

## 2020-03-28 DIAGNOSIS — H43812 Vitreous degeneration, left eye: Secondary | ICD-10-CM | POA: Insufficient documentation

## 2020-03-28 DIAGNOSIS — H2511 Age-related nuclear cataract, right eye: Secondary | ICD-10-CM | POA: Insufficient documentation

## 2020-03-28 DIAGNOSIS — H43811 Vitreous degeneration, right eye: Secondary | ICD-10-CM | POA: Insufficient documentation

## 2020-03-28 DIAGNOSIS — H2512 Age-related nuclear cataract, left eye: Secondary | ICD-10-CM | POA: Insufficient documentation

## 2020-03-28 DIAGNOSIS — H43311 Vitreous membranes and strands, right eye: Secondary | ICD-10-CM | POA: Insufficient documentation

## 2020-04-05 ENCOUNTER — Encounter (INDEPENDENT_AMBULATORY_CARE_PROVIDER_SITE_OTHER): Payer: BLUE CROSS/BLUE SHIELD | Admitting: Ophthalmology

## 2020-04-05 DIAGNOSIS — H43812 Vitreous degeneration, left eye: Secondary | ICD-10-CM

## 2020-04-05 DIAGNOSIS — H43311 Vitreous membranes and strands, right eye: Secondary | ICD-10-CM

## 2020-04-05 DIAGNOSIS — H2512 Age-related nuclear cataract, left eye: Secondary | ICD-10-CM

## 2020-04-05 DIAGNOSIS — H43811 Vitreous degeneration, right eye: Secondary | ICD-10-CM

## 2020-04-05 DIAGNOSIS — H2511 Age-related nuclear cataract, right eye: Secondary | ICD-10-CM

## 2020-04-05 DIAGNOSIS — H04123 Dry eye syndrome of bilateral lacrimal glands: Secondary | ICD-10-CM

## 2020-04-09 ENCOUNTER — Encounter: Payer: Self-pay | Admitting: Internal Medicine

## 2020-04-23 ENCOUNTER — Encounter (INDEPENDENT_AMBULATORY_CARE_PROVIDER_SITE_OTHER): Payer: Self-pay | Admitting: Ophthalmology

## 2020-04-23 ENCOUNTER — Ambulatory Visit (INDEPENDENT_AMBULATORY_CARE_PROVIDER_SITE_OTHER): Payer: BLUE CROSS/BLUE SHIELD | Admitting: Ophthalmology

## 2020-04-23 ENCOUNTER — Other Ambulatory Visit: Payer: Self-pay

## 2020-04-23 DIAGNOSIS — E119 Type 2 diabetes mellitus without complications: Secondary | ICD-10-CM | POA: Diagnosis not present

## 2020-04-23 DIAGNOSIS — H43812 Vitreous degeneration, left eye: Secondary | ICD-10-CM | POA: Diagnosis not present

## 2020-04-23 DIAGNOSIS — H43811 Vitreous degeneration, right eye: Secondary | ICD-10-CM

## 2020-04-23 DIAGNOSIS — H2512 Age-related nuclear cataract, left eye: Secondary | ICD-10-CM

## 2020-04-23 NOTE — Progress Notes (Signed)
**Note De-Identified Yin Obfuscation** 04/23/2020     CHIEF COMPLAINT Patient presents for Retina Follow Up   HISTORY OF PRESENT ILLNESS: Felicia Schultz is a 69 y.o. female who presents to the clinic today for:   HPI    Retina Follow Up    Patient presents with  PVD.  Severity is moderate.  Duration of 1 year.  Since onset it is stable.  I, the attending physician,  performed the HPI with the patient and updated documentation appropriately.          Comments    1 Year PVD and Diabetic f\u OU. OCT  Pt c/o OU being extremely tired. 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 Pt feels NVA and DVA have decreased. Denies FOL and floaters. DTO:IZTI not check A1C: 5.9 02/2019        Last edited by Tilda Franco on 04/23/2020  2:22 PM. (History)      Referring physician: Vivi Barrack, [place],  [place] [id number]  HISTORICAL INFORMATION:   Selected notes from the MEDICAL RECORD NUMBER    Lab Results  Component Value Date   HGBA1C 5.9 02/18/2019     CURRENT MEDICATIONS: No current outpatient medications on file. (Ophthalmic Drugs)   No current facility-administered medications for this visit. (Ophthalmic Drugs)   Current Outpatient Medications (Other)  Medication Sig  . AMBULATORY NON FORMULARY MEDICATION Medication Name: Kneeling rolling walker Dx: M67.88  . azelastine (ASTELIN) 0.1 % nasal spray Place 1-2 sprays into both nostrils 2 (two) times daily. Use in each nostril as directed  . citalopram (CELEXA) 40 MG tablet Take 1 tablet (40 mg total) by mouth daily.  . cyclobenzaprine (FLEXERIL) 5 MG tablet Take 1 tablet (5 mg total) by mouth 3 (three) times daily as needed for muscle spasms.  . Diclofenac Sodium 2 % SOLN Apply 1 Pump topically 3 (three) times daily as needed.  Marland Kitchen estrogens, conjugated, (PREMARIN) 0.625 MG tablet Take 1 tablet (0.625 mg total) by mouth daily.  Marland Kitchen gabapentin (NEURONTIN) 100 MG capsule Take 2-3 capsules (200-300 mg total) by mouth at bedtime. May also take 1 capsule  (100 mg total) 3 (three) times daily as needed.  . gabapentin (NEURONTIN) 300 MG capsule Take 1 capsule, orally, up to TID or as directed  . meloxicam (MOBIC) 15 MG tablet Take 1 tablet (15 mg total) by mouth daily.  . Menatetrenone (VITAMIN K2) 100 MCG TABS Take 100 mcg by mouth daily.  . montelukast (SINGULAIR) 10 MG tablet Take 1 tablet (10 mg total) by mouth at bedtime.  . Vitamin D, Ergocalciferol, (DRISDOL) 1.25 MG (50000 UNIT) CAPS capsule TAKE ONE CAPSULE BY MOUTH EVERY 7 DAYS   No current facility-administered medications for this visit. (Other)      REVIEW OF SYSTEMS:    ALLERGIES Allergies  Allergen Reactions  . Amoxicillin-Pot Clavulanate Nausea Only    Severe nausea. Couldn't keep medication down  Severe nausea. Couldn't keep medication down  . Clarithromycin Hives  . Nitrofurantoin Other (See Comments) and Rash    Unknown Palms and hand itching, turned bright red per pt  . Sulfonamide Derivatives Other (See Comments)    Abdominal cramping, nausea  . Nitrofurantoin Macrocrystal Swelling  . Sulfa Antibiotics Nausea And Vomiting and Other (See Comments)    Unknown    PAST MEDICAL HISTORY Past Medical History:  Diagnosis Date  . Allergy   . Atrial flutter ([place])    typical appearing  . Cancer ([place]) **Note De-Identified Mcconaughey Obfuscation** basal ca - nose - 1985  . GERD (gastroesophageal reflux disease)   . History of diabetes mellitus 11/13/2015  . Hypertension   . Hypokalemia   . Osteoporosis 2000  . Paroxysmal atrial fibrillation ([facility])   . Personal history of colonic polyps - sessile serrated 11/23/2013   Past Surgical History:  Procedure Laterality Date  . APPENDECTOMY    . ATRIAL FLUTTER ABLATION N/A 09/05/2014   Procedure: ATRIAL FLUTTER ABLATION;  Surgeon: Thompson Grayer, MD;  Location: [facility] CATH LAB;  Service: Cardiovascular;  Laterality: N/A;  . BREAST EXCISIONAL BIOPSY Right 01/2016  . CARPAL TUNNEL RELEASE Bilateral   . FOOT SURGERY Bilateral 2012   shorten bones in both feet  .  GASTRIC BYPASS    . removal of ovary Right 1987  . SPINE SURGERY  2000/2012  . TOTAL ABDOMINAL HYSTERECTOMY  1999  . WRIST SURGERY Right 2018   Dr. Apolonio Schneiders -ganglion excision    FAMILY HISTORY Family History  Problem Relation Age of Onset  . Diabetes Mother   . Heart disease Mother   . Arthritis Mother   . Hearing loss Mother   . Hyperlipidemia Mother   . Hypertension Mother   . Kidney disease Mother   . Learning disabilities Mother   . Miscarriages / [place] Mother   . Heart disease Father   . Hypertension Father   . Alcohol abuse Father   . Hyperlipidemia Father   . Emphysema Maternal Grandmother   . Cancer Maternal Grandmother        throat and lung(smoker)  . Heart disease Brother   . Cancer Brother        lung  . Colon cancer Maternal Grandfather   . Cancer Maternal Grandfather        colon  . Scleroderma Sister   . Early death Sister        schlederma  . Miscarriages / Stillbirths Sister   . Stroke Brother   . Emphysema Sister   . Diabetes Sister   . Hyperlipidemia Sister   . Hypertension Sister   . Mental illness Sister   . Miscarriages / Stillbirths Sister   . Stroke Sister   . Lung cancer Brother        smoker  . Diabetes Brother   . Diabetes Sister   . Pancreatic cancer Neg Hx   . Rectal cancer Neg Hx   . Stomach cancer Neg Hx     SOCIAL HISTORY Social History   Tobacco Use  . Smoking status: Never Smoker  . Smokeless tobacco: Never Used  Vaping Use  . Vaping Use: Never used  Substance Use Topics  . Alcohol use: No  . Drug use: No         OPHTHALMIC EXAM:  Base Eye Exam    Visual Acuity (Snellen - Linear)      Right Left   Dist [place] 20/40 + 20/20 -1   Dist ph [place] 20/20        Tonometry (Tonopen, 2:25 PM)      Right Left   Pressure 18 20       Pupils      Pupils Dark Light Shape React APD   Right PERRL 4 3 Round Brisk None   Left PERRL 4 3 Round Brisk None       Visual Fields (Counting fingers)      Left Right     Full Full       Neuro/Psych    Oriented x3: Yes **Note De-Identified Mirabella Obfuscation** Mood/Affect: Normal       Dilation    Both eyes: 1.0% Mydriacyl, 2.5% Phenylephrine @ 2:25 PM        Slit Lamp and Fundus Exam    External Exam      Right Left   External Normal Normal       Slit Lamp Exam      Right Left   Lids/Lashes Normal Normal   Conjunctiva/Sclera White and quiet White and quiet   Cornea Clear Clear   Anterior Chamber Deep and quiet Deep and quiet   Iris Round and reactive Round and reactive   Lens 2+ Nuclear sclerosis 2+ Nuclear sclerosis   Anterior Vitreous Normal Normal       Fundus Exam      Right Left   C/D Ratio 0.4 0.3   Vessels no DR no DR          IMAGING AND PROCEDURES  Imaging and Procedures for 04/23/20  OCT, Retina - OU - Both Eyes       Right Eye Quality was good. Scan locations included subfoveal. Central Foveal Thickness: 299. Progression has been stable. Findings include normal foveal contour.   Left Eye Quality was good. Scan locations included subfoveal. Central Foveal Thickness: 295. Progression has been stable. Findings include normal foveal contour.   Notes Incidental posterior vitreous detachment noted OU                ASSESSMENT/PLAN:  Diabetes mellitus without complication ([place]) The patient has diabetes without any evidence of retinopathy. The patient advised to maintain good blood glucose control, excellent blood pressure control, and favorable levels of cholesterol, low density lipoprotein, and high density lipoproteins. Follow up in 1 year was recommended. Explained that fluctuations in visual acuity , or "out of focus", may result from large variations of blood sugar control.  Nuclear sclerotic cataract of left eye The nature of cataract was discussed with the patient as well as the elective nature of surgery. The patient was reassured that surgery at a later date does not put the patient at risk for a worse outcome. It was emphasized that the  need for surgery is dictated by the patient's quality of life as influenced by the cataract. Patient was instructed to maintain close follow up with their general eye care doctor.  Posterior vitreous detachment of right eye   The nature of posterior vitreous detachment was discussed with the patient as well as its physiology, its age prevalence, and its possible implication regarding retinal breaks and detachment.  An informational brochure was given to the patient.  All the patient's questions were answered.  The patient was asked to return if new or different flashes or floaters develops.   Patient was instructed to contact office immediately if any changes were noticed. I explained to the patient that vitreous inside the eye is similar to jello inside a bowl. As the jello melts it can start to pull away from the bowl, similarly the vitreous throughout our lives can begin to pull away from the retina. That process is called a posterior vitreous detachment. In some cases, the vitreous can tug hard enough on the retina to form a retinal tear. I discussed with the patient the signs and symptoms of a retinal detachment.  Do not rub the eye.      ICD-10-CM   1. Posterior vitreous detachment of left eye  H43.812 OCT, Retina - OU - Both Eyes  2. Posterior vitreous detachment of right eye **Note De-Identified Leibensperger Obfuscation** H43.811 OCT, Retina - OU - Both Eyes  3. Diabetes mellitus without complication ([place])  J18.8   4. Nuclear sclerotic cataract of left eye  H25.12     1. Findings discussed. No treatment indicated.   2.  3.  Ophthalmic Meds Ordered this visit:  No orders of the defined types were placed in this encounter.      Return in about 1 year (around 04/23/2021) for DILATE OU, OCT.  There are no Patient Instructions on file for this visit.   Explained the diagnoses, plan, and follow up with the patient and they expressed understanding.  Patient expressed understanding of the importance of proper follow up care.    Clent Demark Anamari Galeas M.D. Diseases & Surgery of the Retina and Vitreous Retina & Diabetic [place] 04/23/20     Abbreviations: M myopia (nearsighted); A astigmatism; H hyperopia (farsighted); P presbyopia; Mrx spectacle prescription;  CTL contact lenses; OD right eye; OS left eye; OU both eyes  XT exotropia; ET esotropia; PEK punctate epithelial keratitis; PEE punctate epithelial erosions; DES dry eye syndrome; MGD meibomian gland dysfunction; ATs artificial tears; PFAT's preservative free artificial tears; [place] nuclear sclerotic cataract; PSC posterior subcapsular cataract; ERM epi-retinal membrane; PVD posterior vitreous detachment; RD retinal detachment; DM diabetes mellitus; DR diabetic retinopathy; NPDR non-proliferative diabetic retinopathy; PDR proliferative diabetic retinopathy; CSME clinically significant macular edema; DME diabetic macular edema; dbh dot blot hemorrhages; CWS cotton wool spot; POAG primary open angle glaucoma; C/D cup-to-disc ratio; HVF humphrey visual field; GVF goldmann visual field; OCT optical coherence tomography; IOP intraocular pressure; BRVO Branch retinal vein occlusion; CRVO central retinal vein occlusion; CRAO central retinal artery occlusion; BRAO branch retinal artery occlusion; RT retinal tear; SB scleral buckle; PPV pars plana vitrectomy; VH Vitreous hemorrhage; PRP panretinal laser photocoagulation; IVK intravitreal kenalog; VMT vitreomacular traction; MH Macular hole;  NVD neovascularization of the disc; NVE neovascularization elsewhere; AREDS age related eye disease study; ARMD age related macular degeneration; POAG primary open angle glaucoma; EBMD epithelial/anterior basement membrane dystrophy; ACIOL anterior chamber intraocular lens; IOL intraocular lens; PCIOL posterior chamber intraocular lens; Phaco/IOL phacoemulsification with intraocular lens placement; [place] photorefractive keratectomy; LASIK laser assisted in situ keratomileusis; HTN hypertension; DM  diabetes mellitus; COPD chronic obstructive pulmonary disease

## 2020-04-23 NOTE — Assessment & Plan Note (Signed)
**Note De-identified Fugitt Obfuscation** The nature of cataract was discussed with the patient as well as the elective nature of surgery. The patient was reassured that surgery at a later date does not put the patient at risk for a worse outcome. It was emphasized that the need for surgery is dictated by the patient's quality of life as influenced by the cataract. Patient was instructed to maintain close follow up with their general eye care doctor.   

## 2020-04-23 NOTE — Assessment & Plan Note (Signed)
**Note De-identified [place] Obfuscation** The patient has diabetes without any evidence of retinopathy. The patient advised to maintain good blood glucose control, excellent blood pressure control, and favorable levels of cholesterol, low density lipoprotein, and high density lipoproteins. Follow up in 1 year was recommended. Explained that fluctuations in visual acuity , or "out of focus", may result from large variations of blood sugar control. 

## 2020-04-23 NOTE — Assessment & Plan Note (Signed)
**Note De-identified Paige Obfuscation** The nature of posterior vitreous detachment was discussed with the patient as well as its physiology, its age prevalence, and its possible implication regarding retinal breaks and detachment.  An informational brochure was given to the patient.  All the patient's questions were answered.  The patient was asked to return if new or different flashes or floaters develops.   Patient was instructed to contact office immediately if any changes were noticed. I explained to the patient that vitreous inside the eye is similar to jello inside a bowl. As the jello melts it can start to pull away from the bowl, similarly the vitreous throughout our lives can begin to pull away from the retina. That process is called a posterior vitreous detachment. In some cases, the vitreous can tug hard enough on the retina to form a retinal tear. I discussed with the patient the signs and symptoms of a retinal detachment.  Do not rub the eye. 

## 2020-05-02 ENCOUNTER — Ambulatory Visit (AMBULATORY_SURGERY_CENTER): Payer: Self-pay | Admitting: *Deleted

## 2020-05-02 ENCOUNTER — Other Ambulatory Visit: Payer: Self-pay

## 2020-05-02 VITALS — Ht 64.0 in | Wt 175.0 lb

## 2020-05-02 DIAGNOSIS — Z86010 Personal history of colonic polyps: Secondary | ICD-10-CM

## 2020-05-02 MED ORDER — NA SULFATE-K SULFATE-MG SULF 17.5-3.13-1.6 GM/177ML PO SOLN: 1.0000 | Freq: Once | ORAL | 0 refills | Status: AC

## 2020-05-02 NOTE — Progress Notes (Signed)
**Note De-identified Zaro Obfuscation** No egg or soy allergy known to patient  No issues with past sedation with any surgeries or procedures no intubation problems in the past  No FH of Malignant Hyperthermia No diet pills per patient No home 02 use per patient  No blood thinners per patient  Pt denies issues with constipation  No A fib or A flutter  EMMI video to pt or [place] MyChart  COVID 19 guidelines implemented in [place] today with Pt and RN    Due to the COVID-19 pandemic we are asking patients to follow these guidelines. Please only bring one care partner. Please be aware that your care partner may wait in the car in the parking lot or if they feel like they will be too hot to wait in the car, they may wait in the lobby on the 4th floor. All care partners are required to wear a mask the entire time (we do not have any that we can provide them), they need to practice social distancing, and we will do a Covid check for all patient's and care partners when you arrive. Also we will check their temperature and your temperature. If the care partner waits in their car they need to stay in the parking lot the entire time and we will call them on their cell phone when the patient is ready for discharge so they can bring the car to the front of the building. Also all patient's will need to wear a mask into building. 

## 2020-05-03 ENCOUNTER — Encounter: Payer: Self-pay | Admitting: Internal Medicine

## 2020-05-07 ENCOUNTER — Emergency Department (HOSPITAL_COMMUNITY)
Admission: EM | Admit: 2020-05-07 | Discharge: 2020-05-07 | Discharge status: Absent without leave | Payer: BLUE CROSS/BLUE SHIELD

## 2020-05-07 ENCOUNTER — Telehealth: Payer: Self-pay

## 2020-05-07 ENCOUNTER — Encounter (HOSPITAL_COMMUNITY): Payer: Self-pay | Admitting: Emergency Medicine

## 2020-05-07 ENCOUNTER — Emergency Department (HOSPITAL_COMMUNITY)
Admission: EM | Admit: 2020-05-07 | Discharge: 2020-05-07 | Disposition: A | Discharge status: Home / Self Care | Payer: BLUE CROSS/BLUE SHIELD | Attending: Emergency Medicine | Admitting: Emergency Medicine

## 2020-05-07 ENCOUNTER — Ambulatory Visit (HOSPITAL_COMMUNITY)
Admission: EM | Admit: 2020-05-07 | Discharge: 2020-05-07 | Disposition: A | Discharge status: Home / Self Care | Payer: BLUE CROSS/BLUE SHIELD

## 2020-05-07 ENCOUNTER — Emergency Department (HOSPITAL_COMMUNITY): Payer: BLUE CROSS/BLUE SHIELD

## 2020-05-07 ENCOUNTER — Encounter (HOSPITAL_COMMUNITY): Payer: Self-pay | Admitting: *Deleted

## 2020-05-07 ENCOUNTER — Other Ambulatory Visit: Payer: Self-pay

## 2020-05-07 DIAGNOSIS — R531 Weakness: Secondary | ICD-10-CM

## 2020-05-07 DIAGNOSIS — I10 Essential (primary) hypertension: Secondary | ICD-10-CM | POA: Insufficient documentation

## 2020-05-07 DIAGNOSIS — R5383 Other fatigue: Secondary | ICD-10-CM

## 2020-05-07 DIAGNOSIS — Z86010 Personal history of colonic polyps: Secondary | ICD-10-CM | POA: Diagnosis not present

## 2020-05-07 DIAGNOSIS — R1011 Right upper quadrant pain: Secondary | ICD-10-CM | POA: Insufficient documentation

## 2020-05-07 DIAGNOSIS — K219 Gastro-esophageal reflux disease without esophagitis: Secondary | ICD-10-CM | POA: Insufficient documentation

## 2020-05-07 DIAGNOSIS — E1136 Type 2 diabetes mellitus with diabetic cataract: Secondary | ICD-10-CM | POA: Diagnosis not present

## 2020-05-07 DIAGNOSIS — Z85828 Personal history of other malignant neoplasm of skin: Secondary | ICD-10-CM | POA: Diagnosis not present

## 2020-05-07 DIAGNOSIS — R110 Nausea: Secondary | ICD-10-CM

## 2020-05-07 DIAGNOSIS — Z79899 Other long term (current) drug therapy: Secondary | ICD-10-CM | POA: Insufficient documentation

## 2020-05-07 DIAGNOSIS — J90 Pleural effusion, not elsewhere classified: Secondary | ICD-10-CM | POA: Diagnosis not present

## 2020-05-07 DIAGNOSIS — R1031 Right lower quadrant pain: Secondary | ICD-10-CM | POA: Diagnosis not present

## 2020-05-07 DIAGNOSIS — R739 Hyperglycemia, unspecified: Secondary | ICD-10-CM

## 2020-05-07 DIAGNOSIS — Z20822 Contact with and (suspected) exposure to covid-19: Secondary | ICD-10-CM | POA: Diagnosis not present

## 2020-05-07 DIAGNOSIS — R634 Abnormal weight loss: Secondary | ICD-10-CM | POA: Diagnosis not present

## 2020-05-07 DIAGNOSIS — E1165 Type 2 diabetes mellitus with hyperglycemia: Secondary | ICD-10-CM | POA: Insufficient documentation

## 2020-05-07 LAB — CBC WITH DIFFERENTIAL/PLATELET
Abs Immature Granulocytes: 0.01 10*3/uL (ref 0.00–0.07)
Basophils Absolute: 0 10*3/uL (ref 0.0–0.1)
Basophils Relative: 1 %
Eosinophils Absolute: 0.1 10*3/uL (ref 0.0–0.5)
Eosinophils Relative: 1 %
HCT: 42.5 % (ref 36.0–46.0)
Hemoglobin: 13.5 g/dL (ref 12.0–15.0)
Immature Granulocytes: 0 %
Lymphocytes Relative: 27 %
Lymphs Abs: 1.5 10*3/uL (ref 0.7–4.0)
MCH: 30.9 pg (ref 26.0–34.0)
MCHC: 31.8 g/dL (ref 30.0–36.0)
MCV: 97.3 fL (ref 80.0–100.0)
Monocytes Absolute: 0.4 10*3/uL (ref 0.1–1.0)
Monocytes Relative: 7 %
Neutro Abs: 3.6 10*3/uL (ref 1.7–7.7)
Neutrophils Relative %: 64 %
Platelets: 185 10*3/uL (ref 150–400)
RBC: 4.37 MIL/uL (ref 3.87–5.11)
RDW: 13.7 % (ref 11.5–15.5)
WBC: 5.7 10*3/uL (ref 4.0–10.5)
nRBC: 0 % (ref 0.0–0.2)

## 2020-05-07 LAB — URINALYSIS, ROUTINE W REFLEX MICROSCOPIC
Glucose, UA: NEGATIVE mg/dL
Hgb urine dipstick: NEGATIVE
Ketones, ur: 5 mg/dL — AB
Leukocytes,Ua: NEGATIVE
Nitrite: NEGATIVE
Protein, ur: 100 mg/dL — AB
Specific Gravity, Urine: 1.028 (ref 1.005–1.030)
pH: 5 (ref 5.0–8.0)

## 2020-05-07 LAB — COMPREHENSIVE METABOLIC PANEL
ALT: 22 U/L (ref 0–44)
AST: 25 U/L (ref 15–41)
Albumin: 3.9 g/dL (ref 3.5–5.0)
Alkaline Phosphatase: 87 U/L (ref 38–126)
Anion gap: 9 (ref 5–15)
BUN: 22 mg/dL (ref 8–23)
CO2: 23 mmol/L (ref 22–32)
Calcium: 8.9 mg/dL (ref 8.9–10.3)
Chloride: 104 mmol/L (ref 98–111)
Creatinine, Ser: 0.84 mg/dL (ref 0.44–1.00)
GFR, Estimated: >60 mL/min (ref >60)
Glucose, Bld: 249 mg/dL — ABNORMAL HIGH (ref 70–99)
Potassium: 4.1 mmol/L (ref 3.5–5.1)
Sodium: 136 mmol/L (ref 135–145)
Total Bilirubin: 0.7 mg/dL (ref 0.3–1.2)
Total Protein: 6.8 g/dL (ref 6.5–8.1)

## 2020-05-07 LAB — RESP PANEL BY RT-PCR (FLU A&B, COVID) ARPGX2
Influenza A by PCR: NEGATIVE
Influenza B by PCR: NEGATIVE
SARS Coronavirus 2 by RT PCR: NEGATIVE

## 2020-05-07 LAB — LIPASE, BLOOD: Lipase: 34 U/L (ref 11–51)

## 2020-05-07 MED ORDER — ONDANSETRON 4 MG PO TBDP: 4.0000 mg | ORAL_TABLET | Freq: Three times a day (TID) | ORAL | 1 refills | Status: DC | PRN

## 2020-05-07 MED ORDER — HYDROCODONE-ACETAMINOPHEN 5-325 MG PO TABS: 1.0000 | ORAL_TABLET | Freq: Four times a day (QID) | ORAL | 0 refills | Status: DC | PRN

## 2020-05-07 MED ORDER — ONDANSETRON 4 MG PO TBDP
4.0000 mg | ORAL_TABLET | Freq: Once | ORAL | Status: AC
  Administered 2020-05-07: 4 mg via ORAL

## 2020-05-07 MED ORDER — ONDANSETRON 4 MG PO TBDP
ORAL_TABLET | ORAL | Status: AC
  Filled 2020-05-07: qty 1

## 2020-05-07 MED ORDER — KETOROLAC TROMETHAMINE 30 MG/ML IJ SOLN
INTRAMUSCULAR | Status: AC
  Filled 2020-05-07: qty 1

## 2020-05-07 MED ORDER — IOHEXOL 300 MG/ML  SOLN
100.0000 mL | Freq: Once | INTRAMUSCULAR | Status: AC | PRN
  Administered 2020-05-07: 100 mL via INTRAVENOUS

## 2020-05-07 MED ORDER — KETOROLAC TROMETHAMINE 60 MG/2ML IM SOLN
30.0000 mg | Freq: Once | INTRAMUSCULAR | Status: AC
  Administered 2020-05-07: 30 mg via INTRAMUSCULAR

## 2020-05-07 NOTE — Telephone Encounter (Signed)
**Note De-Identified Cenci Obfuscation** Nurse Assessment Nurse: Zenia Resides, RN, Diane Date/Time Eilene Ghazi Time): 05/07/2020 9:26:44 AM Confirm and document reason for call. If symptomatic, describe symptoms. ---Caller states she is having side pain with nausea. Started about 3 days ago, but getting worse. Side is tender to touch and maybe some swelling. Pain is more under her rib cage and her belly button. Pain is on the right. Temp is 98.2. Pt. is also having some body aches. Pain medication is not helping. Does the patient have any new or worsening symptoms? ---Yes Will a triage be completed? ---Yes Related visit to physician within the last 2 weeks? ---No Does the PT have any chronic conditions? (i.e. diabetes, asthma, this includes High risk factors for pregnancy, etc.) ---Yes List chronic conditions. ---gastric bypass 2005 Is this a behavioral health or substance abuse call? ---No Guidelines Guideline Title Affirmed Question Affirmed Notes Nurse Date/Time Eilene Ghazi Time) Abdominal Pain - Upper [1] Pain lasts > 10 minutes AND [2] age > 78 Zenia Resides, RN, Diane 05/07/2020 9:30:49 AM Disp. Time Eilene Ghazi Time) Disposition Final User 05/07/2020 9:33:54 AM Go to ED Now Yes Zenia Resides, RN, Diane PLEASE NOTE: All timestamps contained within this report are represented as [place] Standard Time. CONFIDENTIALTY NOTICE: This fax transmission is intended only for the addressee. It contains information that is legally privileged, confidential or otherwise protected from use or disclosure. If you are not the intended recipient, you are strictly prohibited from reviewing, disclosing, copying using or disseminating any of this information or taking any action in reliance on or regarding this information. If you have received this fax in error, please notify us immediately by telephone so that we can arrange for its return to [place]. Phone: [phone], Toll-Free: [phone], Fax: [phone] Page: 2 of 2 Call Id: [id number] [place] Disagree/Comply  Comply Caller Understands Yes PreDisposition Did not know what to do Care Advice Given Per Guideline GO TO ED NOW: * You need to be seen in the Emergency Department. DRIVING: * Another adult should drive. CALL EMS 911 IF: * Passes out or becomes too weak to stand * Severe difficulty breathing occurs. CARE ADVICE given per Abdominal Pain, Upper (Adult) guideline. Referrals GO TO FACILITY OTHER - SPECIF

## 2020-05-07 NOTE — ED Triage Notes (Signed)
**Note De-Identified Nudelman Obfuscation** Pt c/o RLQ abdominal pain since Friday morning. Pt also c/o body aches, fatigue, nausea, decreased appetite x 4 weeks.

## 2020-05-07 NOTE — ED Triage Notes (Signed)
**Note De-Identified Alexie Obfuscation** 11/19 is when symptoms started .  Patient complains of abdominal pain, headache, nausea, general aches.  Patient has fel feverish, randomly.  Non-productive cough, heavy in chest.  history of sinus infections.    Patient has not felt well over the last month.  Patient has lost 14-15 pounds over the month

## 2020-05-07 NOTE — ED Notes (Addendum)
**Note De-Identified [place] Obfuscation** Patient is being discharged from the Urgent Care and sent to the Emergency Department [place] pov . Per Merrie Roof, PA, patient is in need of higher level of care due to Severe RUQ pain, nausea,weight loss and fatigue. Patient is aware and verbalizes understanding of plan of care.  Vitals:   05/07/20 1333  BP: 139/68  Pulse: 85  Resp: 20  Temp: 99.2 F (37.3 C)  SpO2: 94%

## 2020-05-07 NOTE — Discharge Instructions (Addendum)
**Note De-Identified Marland Obfuscation** Work-up here today without any acute findings other than your blood sugar was at 249.  Possibly could represent early diabetes.  Make an appointment to follow-up with your regular doctor.  Also would consider upper endoscopy for the epigastric right upper quadrant abdominal pain with your gastroenterologist to call for appointment.  Today's CT scan without any acute findings.  Labs without any acute findings.  Return for any new or worse symptoms.  Take the pain medication and antinausea medicine as needed.

## 2020-05-07 NOTE — ED Provider Notes (Signed)
**Note De-Identified [place] Obfuscation** [facility] EMERGENCY DEPARTMENT Provider Note   CSN: [id number] Arrival date & time: 05/07/20  1540     History Chief Complaint  Patient presents with  . Abdominal Pain    Felicia Schultz is a 69 y.o. female.  Patient with complaint of right upper quadrant pain since Friday.  It is intermittent in nature.  Nausea but no vomiting questionable fever no diarrhea.  Patient's not been feeling well for about 4 to 5 weeks low energy.  Denies any dysuria.  No history of similar problems.  Patient has had gastric bypass surgery.  Does have a history of gastroesophageal reflux disease.        Past Medical History:  Diagnosis Date  . Allergy   . Atrial flutter ([place])    typical appearing  . Cancer ([place])    basal ca - nose - 1985  . GERD (gastroesophageal reflux disease)   . H/O cardiac radiofrequency ablation 2015  . H/O gastric bypass 2005  . History of diabetes mellitus 11/13/2015  . Hypertension   . Hypokalemia   . Osteoporosis 2000  . Paroxysmal atrial fibrillation (HCC)   . Personal history of colonic polyps - sessile serrated 11/23/2013    Patient Active Problem List   Diagnosis Date Noted  . Diabetes mellitus without complication ([place]) 22/63/3354  . Bilateral dry eyes 03/28/2020  . Vitreous membranes and strands, right 03/28/2020  . Nuclear sclerotic cataract of right eye 03/28/2020  . Nuclear sclerotic cataract of left eye 03/28/2020  . Posterior vitreous detachment of left eye 03/28/2020  . Posterior vitreous detachment of right eye 03/28/2020  . Hyperglycemia 02/18/2019  . S/P gastric bypass 07/09/2018  . Achilles tendinosis 04/30/2018  . Frequent stools 02/10/2018  . Spondylosis of lumbar joint 05/28/2017  . Plantar fasciitis, bilateral 05/28/2017  . Chronic pain of right wrist 05/28/2017  . Haglund's deformity of right heel 02/02/2017  . Postmenopausal symptoms 06/02/2016  . Arthritis of neck 07/19/2014  . GERD (gastroesophageal reflux disease) 07/19/2014  .  Vitamin D deficiency 07/19/2014  . Personal history of colonic polyps - sessile serrated 11/23/2013  . Allergic rhinitis 11/11/2011    Past Surgical History:  Procedure Laterality Date  . APPENDECTOMY    . ATRIAL FLUTTER ABLATION N/A 09/05/2014   Procedure: ATRIAL FLUTTER ABLATION;  Surgeon: Thompson Grayer, MD;  Location: [facility] CATH LAB;  Service: Cardiovascular;  Laterality: N/A;  . BREAST EXCISIONAL BIOPSY Right 01/2016  . CARPAL TUNNEL RELEASE Bilateral   . FOOT SURGERY Bilateral 2012   shorten bones in both feet  . GASTRIC BYPASS    . GASTRIC BYPASS    . removal of ovary Right 1987  . SPINE SURGERY  2000/2012  . TOTAL ABDOMINAL HYSTERECTOMY  1999  . WRIST SURGERY Right 2018   Dr. Apolonio Schneiders -ganglion excision     OB History   No obstetric history on file.     Family History  Problem Relation Age of Onset  . Diabetes Mother   . Heart disease Mother   . Arthritis Mother   . Hearing loss Mother   . Hyperlipidemia Mother   . Hypertension Mother   . Kidney disease Mother   . Learning disabilities Mother   . Miscarriages / [place] Mother   . Heart disease Father   . Hypertension Father   . Alcohol abuse Father   . Hyperlipidemia Father   . Emphysema Maternal Grandmother   . Cancer Maternal Grandmother        throat and lung(smoker)  . **Note De-Identified [place] Obfuscation** Esophageal cancer Maternal Grandmother   . Heart disease Brother   . Cancer Brother        lung  . Colon cancer Maternal Grandfather   . Cancer Maternal Grandfather        colon  . Scleroderma Sister   . Early death Sister        schlederma  . Miscarriages / Stillbirths Sister   . Stroke Brother   . Emphysema Sister   . Diabetes Sister   . Hyperlipidemia Sister   . Hypertension Sister   . Mental illness Sister   . Miscarriages / Stillbirths Sister   . Stroke Sister   . Lung cancer Brother        smoker  . Diabetes Brother   . Diabetes Sister   . Pancreatic cancer Neg Hx   . Rectal cancer Neg Hx   . Stomach cancer Neg Hx      Social History   Tobacco Use  . Smoking status: Never Smoker  . Smokeless tobacco: Never Used  Vaping Use  . Vaping Use: Never used  Substance Use Topics  . Alcohol use: No  . Drug use: No    Home Medications Prior to Admission medications   Medication Sig Start Date End Date Taking? Authorizing Provider  azelastine (ASTELIN) 0.1 % nasal spray Place 1-2 sprays into both nostrils 2 (two) times daily. Use in each nostril as directed 08/30/19   Vivi Barrack, MD  citalopram (CELEXA) 40 MG tablet Take 1 tablet (40 mg total) by mouth daily. 08/30/19   Vivi Barrack, MD  cyclobenzaprine (FLEXERIL) 5 MG tablet Take 1 tablet (5 mg total) by mouth 3 (three) times daily as needed for muscle spasms. 08/30/19   Vivi Barrack, MD  Diclofenac Sodium 2 % SOLN Apply 1 Pump topically 3 (three) times daily as needed. 05/03/19   Vivi Barrack, MD  estrogens, conjugated, (PREMARIN) 0.625 MG tablet Take 1 tablet (0.625 mg total) by mouth daily. 08/30/19   Vivi Barrack, MD  gabapentin (NEURONTIN) 100 MG capsule Take 2-3 capsules (200-300 mg total) by mouth at bedtime. May also take 1 capsule (100 mg total) 3 (three) times daily as needed. 08/30/19   Vivi Barrack, MD  gabapentin (NEURONTIN) 300 MG capsule Take 1 capsule, orally, up to TID or as directed 08/30/19   Vivi Barrack, MD  HYDROcodone-acetaminophen (NORCO/VICODIN) 5-325 MG tablet Take 2 tablets by mouth every 6 (six) hours as needed for moderate pain.    [provider]  meloxicam (MOBIC) 15 MG tablet Take 1 tablet (15 mg total) by mouth daily. 08/30/19   Vivi Barrack, MD  Menatetrenone (VITAMIN K2) 100 MCG TABS Take 100 mcg by mouth daily. 02/15/19   Vivi Barrack, MD  montelukast (SINGULAIR) 10 MG tablet Take 1 tablet (10 mg total) by mouth at bedtime. 08/30/19   Vivi Barrack, MD  Vitamin D, Ergocalciferol, (DRISDOL) 1.25 MG (50000 UNIT) CAPS capsule TAKE ONE CAPSULE BY MOUTH EVERY 7 DAYS 01/16/20   Vivi Barrack, MD     Allergies    Amoxicillin-pot clavulanate, Clarithromycin, Nitrofurantoin, Sulfonamide derivatives, Azithromycin, Nitrofurantoin macrocrystal, and Sulfa antibiotics  Review of Systems   Review of Systems  Constitutional: Positive for fatigue and fever. Negative for chills.  HENT: Negative for rhinorrhea and sore throat.   Eyes: Negative for visual disturbance.  Respiratory: Negative for cough and shortness of breath.   Cardiovascular: Negative for chest pain and leg swelling.  Gastrointestinal: **Note De-Identified Gianfrancesco Obfuscation** Positive for abdominal pain and nausea. Negative for diarrhea and vomiting.  Genitourinary: Negative for dysuria.  Musculoskeletal: Negative for back pain and neck pain.  Skin: Negative for rash.  Neurological: Negative for dizziness, light-headedness and headaches.  Hematological: Does not bruise/bleed easily.  Psychiatric/Behavioral: Negative for confusion.    Physical Exam Updated Vital Signs BP 115/80 (BP Location: Right Arm)   Pulse (!) 107   Temp 97.9 F (36.6 C) (Oral)   Resp 18   Ht 1.626 m (5\' 4" )   Wt 79.4 kg   SpO2 95%   BMI 30.04 kg/m   Physical Exam Vitals and nursing note reviewed.  Constitutional:      General: She is not in acute distress.    Appearance: Normal appearance. She is well-developed.  HENT:     Head: Normocephalic and atraumatic.  Eyes:     Extraocular Movements: Extraocular movements intact.     Conjunctiva/sclera: Conjunctivae normal.     Pupils: Pupils are equal, round, and reactive to light.  Cardiovascular:     Rate and Rhythm: Normal rate and regular rhythm.     Heart sounds: No murmur heard.   Pulmonary:     Effort: Pulmonary effort is normal. No respiratory distress.     Breath sounds: Normal breath sounds.  Abdominal:     Palpations: Abdomen is soft.     Tenderness: There is no abdominal tenderness. There is no guarding.  Musculoskeletal:        General: No swelling. Normal range of motion.     Cervical back: Normal range of  motion and neck supple.  Skin:    General: Skin is warm and dry.     Capillary Refill: Capillary refill takes less than 2 seconds.  Neurological:     General: No focal deficit present.     Mental Status: She is alert and oriented to person, place, and time.     Cranial Nerves: No cranial nerve deficit.     ED Results / Procedures / Treatments   Labs (all labs ordered are listed, but only abnormal results are displayed) Labs Reviewed  COMPREHENSIVE METABOLIC PANEL - Abnormal; Notable for the following components:      Result Value   Glucose, Bld 249 (*)    All other components within normal limits  RESP PANEL BY RT-PCR (FLU A&B, COVID) ARPGX2  LIPASE, BLOOD  CBC WITH DIFFERENTIAL/PLATELET  URINALYSIS, ROUTINE W REFLEX MICROSCOPIC    EKG EKG Interpretation  Date/Time:  Monday May 07 2020 17:10:05 EST Ventricular Rate:  69 PR Interval:    QRS Duration: 94 QT Interval:  393 QTC Calculation: 421 R Axis:   -8 Text Interpretation: Sinus rhythm Low voltage, precordial leads Confirmed by Fredia Sorrow [phone]) on 05/07/2020 5:22:48 PM   Radiology DG Chest Port 1 View  Result Date: 05/07/2020 CLINICAL DATA:  Right upper quadrant pain with body aches EXAM: PORTABLE CHEST 1 VIEW COMPARISON:  08/26/2018 FINDINGS: Coarse chronic interstitial opacity. No focal consolidation or effusion. Normal cardiomediastinal silhouette. No pneumothorax. IMPRESSION: No active disease. Electronically Signed   By: Donavan Foil M.D.   On: 05/07/2020 16:33    Procedures Procedures (including critical care time)  Medications Ordered in ED Medications - No data to display  ED Course  I have reviewed the triage vital signs and the nursing notes.  Pertinent labs & imaging results that were available during my care of the patient were reviewed by me and considered in my medical decision making (see chart for **Note De-Identified [place] Obfuscation** details).    MDM Rules/Calculators/A&P                          Clinically was  concerned about gallstone disease.  But CT scan of abdomen shows no evidence of that.  Labs are also very normal including white blood cell count liver function test and lipase.  There is evidence of renal cyst.  Urine not distinctly consistent with urinary tract infection but sent for culture.  Patient's blood sugar was elevated at 249.  Patient's past stuff mentions diabetes.  But this may have been prior to her gastric bypass surgery.  She does have primary care doctor to follow-up with.  She is followed by [facility] gastroenterology.  Will refer her to them for consider ration for upper endoscopy to make sure no's peptic ulcer disease.  Patient in no acute distress.  Will treat symptomatically with short course of pain medication and antinausea medicine.   Final Clinical Impression(s) / ED Diagnoses Final diagnoses:  None    Rx / DC Orders ED Discharge Orders    None       Fredia Sorrow, MD 05/07/20 2147

## 2020-05-08 ENCOUNTER — Telehealth: Payer: Self-pay | Admitting: Internal Medicine

## 2020-05-08 DIAGNOSIS — R1011 Right upper quadrant pain: Secondary | ICD-10-CM

## 2020-05-08 DIAGNOSIS — Z86010 Personal history of colonic polyps: Secondary | ICD-10-CM

## 2020-05-08 MED FILL — Hydrocodone-Acetaminophen Tab 5-325 MG: ORAL | Qty: 6 | Status: AC

## 2020-05-08 NOTE — Telephone Encounter (Signed)
**Note De-Identified [place] Obfuscation** Make sense to add EGD for right upper quadrant pain based upon chart review

## 2020-05-08 NOTE — Telephone Encounter (Signed)
**Note De-Identified [place] Obfuscation** FYI, pt has GI appt on 12/02.

## 2020-05-08 NOTE — Telephone Encounter (Signed)
**Note De-Identified [place] Obfuscation** Patient called requesting to add EGD to her colonoscopy appt scheduled on 05/17/2020.  Patient was at [facility] ED yesterday for RUQ pain and was told she needs endo.

## 2020-05-08 NOTE — Telephone Encounter (Signed)
**Note De-Identified [place] Obfuscation** Dr. Carlean Purl.  Please review ED notes.  Okay to add EGD?

## 2020-05-08 NOTE — Telephone Encounter (Signed)
**Note De-Identified [place] Obfuscation** Appt changed to endo/colon.  Amb referral updated to endo/colon.  Patient notified

## 2020-05-08 NOTE — ED Provider Notes (Addendum)
**Note De-Identified [place] Obfuscation** [place]    CSN: [id number] Arrival date & time: 05/07/20  1221      History   Chief Complaint Chief Complaint  Patient presents with  . Headache  . Fatigue    HPI Felicia Schultz is a 69 y.o. female.   Patient presenting today for evaluation of about a month of progressively worsening nausea, anorexia, fatigue, upper abdominal pain, weakness, intermittent dizziness, and weight loss of about 15 lb reportedly. States the past 1.5 weeks she's been so fatigued she can't complete her daily tasks and has been taking several naps per day which is unlike her. She presents today after 2-3 days of severe RUQ pain that she states is 8-9/10 and causing her to barely be able to stand at times. Nothing seems to make better or worse, not associated with eating, movement, time of day. Having normal BMs, no vomiting, fevers, CP, SOB, new medication changes, sick contacts, recent travel, dietary changes. Hx of gastric bypass many years ago with no subsequent abdominal issues, has never had this happen before. No hx of gallbladder or liver issues known. No known fhx of GI cancers per patient. Has been taking some old norco she had at home with minimal relief the past day or so.      Past Medical History:  Diagnosis Date  . Allergy   . Atrial flutter ([place])    typical appearing  . Cancer ([place])    basal ca - nose - 1985  . GERD (gastroesophageal reflux disease)   . H/O cardiac radiofrequency ablation 2015  . H/O gastric bypass 2005  . History of diabetes mellitus 11/13/2015  . Hypertension   . Hypokalemia   . Osteoporosis 2000  . Paroxysmal atrial fibrillation ([facility])   . Personal history of colonic polyps - sessile serrated 11/23/2013    Patient Active Problem List   Diagnosis Date Noted  . Diabetes mellitus without complication ([place]) 09/73/5329  . Bilateral dry eyes 03/28/2020  . Vitreous membranes and strands, right 03/28/2020  . Nuclear sclerotic cataract of right eye 03/28/2020   . Nuclear sclerotic cataract of left eye 03/28/2020  . Posterior vitreous detachment of left eye 03/28/2020  . Posterior vitreous detachment of right eye 03/28/2020  . Hyperglycemia 02/18/2019  . S/P gastric bypass 07/09/2018  . Achilles tendinosis 04/30/2018  . Frequent stools 02/10/2018  . Spondylosis of lumbar joint 05/28/2017  . Plantar fasciitis, bilateral 05/28/2017  . Chronic pain of right wrist 05/28/2017  . Haglund's deformity of right heel 02/02/2017  . Postmenopausal symptoms 06/02/2016  . Arthritis of neck 07/19/2014  . GERD (gastroesophageal reflux disease) 07/19/2014  . Vitamin D deficiency 07/19/2014  . Personal history of colonic polyps - sessile serrated 11/23/2013  . Allergic rhinitis 11/11/2011    Past Surgical History:  Procedure Laterality Date  . APPENDECTOMY    . ATRIAL FLUTTER ABLATION N/A 09/05/2014   Procedure: ATRIAL FLUTTER ABLATION;  Surgeon: Thompson Grayer, MD;  Location: [facility] CATH LAB;  Service: Cardiovascular;  Laterality: N/A;  . BREAST EXCISIONAL BIOPSY Right 01/2016  . CARPAL TUNNEL RELEASE Bilateral   . FOOT SURGERY Bilateral 2012   shorten bones in both feet  . GASTRIC BYPASS    . GASTRIC BYPASS    . removal of ovary Right 1987  . SPINE SURGERY  2000/2012  . TOTAL ABDOMINAL HYSTERECTOMY  1999  . WRIST SURGERY Right 2018   Dr. Apolonio Schneiders -ganglion excision    OB History   No obstetric history on file. **Note De-Identified [place]owski Obfuscation** Home Medications    Prior to Admission medications   Medication Sig Start Date End Date Taking? Authorizing Provider  azelastine (ASTELIN) 0.1 % nasal spray Place 1-2 sprays into both nostrils 2 (two) times daily. Use in each nostril as directed 08/30/19  Yes Vivi Barrack, MD  citalopram (CELEXA) 40 MG tablet Take 1 tablet (40 mg total) by mouth daily. 08/30/19  Yes Vivi Barrack, MD  estrogens, conjugated, (PREMARIN) 0.625 MG tablet Take 1 tablet (0.625 mg total) by mouth daily. 08/30/19  Yes Vivi Barrack, MD  montelukast  (SINGULAIR) 10 MG tablet Take 1 tablet (10 mg total) by mouth at bedtime. 08/30/19  Yes Vivi Barrack, MD  Vitamin D, Ergocalciferol, (DRISDOL) 1.25 MG (50000 UNIT) CAPS capsule TAKE ONE CAPSULE BY MOUTH EVERY 7 DAYS Patient taking differently: Take 50,000 Units by mouth every 7 (seven) days.  01/16/20  Yes Vivi Barrack, MD  Cyanocobalamin (VITAMIN B 12 PO) Take 1 tablet by mouth daily.    [provider]  cyclobenzaprine (FLEXERIL) 5 MG tablet Take 1 tablet (5 mg total) by mouth 3 (three) times daily as needed for muscle spasms. Patient not taking: Reported on 05/07/2020 08/30/19   Vivi Barrack, MD  Diclofenac Sodium 2 % SOLN Apply 1 Pump topically 3 (three) times daily as needed. Patient not taking: Reported on 05/07/2020 05/03/19   Vivi Barrack, MD  gabapentin (NEURONTIN) 100 MG capsule Take 2-3 capsules (200-300 mg total) by mouth at bedtime. May also take 1 capsule (100 mg total) 3 (three) times daily as needed. 08/30/19   Vivi Barrack, MD  gabapentin (NEURONTIN) 300 MG capsule Take 1 capsule, orally, up to TID or as directed 08/30/19   Vivi Barrack, MD  HYDROcodone-acetaminophen (NORCO/VICODIN) 5-325 MG tablet Take 1 tablet by mouth every 6 (six) hours as needed for moderate pain. 05/07/20   Fredia Sorrow, MD  meloxicam (MOBIC) 15 MG tablet Take 1 tablet (15 mg total) by mouth daily. Patient not taking: Reported on 05/07/2020 08/30/19   Vivi Barrack, MD  Menatetrenone (VITAMIN K2) 100 MCG TABS Take 100 mcg by mouth daily. Patient not taking: Reported on 05/07/2020 02/15/19   Vivi Barrack, MD  Multiple Vitamins-Minerals (MULTIVITAMIN GUMMIES ADULT PO) Take 1 tablet by mouth daily.    [provider]  ondansetron (ZOFRAN ODT) 4 MG disintegrating tablet Take 1 tablet (4 mg total) by mouth every 8 (eight) hours as needed. 05/07/20   Fredia Sorrow, MD    Family History Family History  Problem Relation Age of Onset  . Diabetes Mother   . Heart disease  Mother   . Arthritis Mother   . Hearing loss Mother   . Hyperlipidemia Mother   . Hypertension Mother   . Kidney disease Mother   . Learning disabilities Mother   . Miscarriages / [place] Mother   . Heart disease Father   . Hypertension Father   . Alcohol abuse Father   . Hyperlipidemia Father   . Emphysema Maternal Grandmother   . Cancer Maternal Grandmother        throat and lung(smoker)  . Esophageal cancer Maternal Grandmother   . Heart disease Brother   . Cancer Brother        lung  . Colon cancer Maternal Grandfather   . Cancer Maternal Grandfather        colon  . Scleroderma Sister   . Early death Sister        schlederma  . **Note De-Identified [place]ar Obfuscation** Miscarriages / Stillbirths Sister   . Stroke Brother   . Emphysema Sister   . Diabetes Sister   . Hyperlipidemia Sister   . Hypertension Sister   . Mental illness Sister   . Miscarriages / Stillbirths Sister   . Stroke Sister   . Lung cancer Brother        smoker  . Diabetes Brother   . Diabetes Sister   . Pancreatic cancer Neg Hx   . Rectal cancer Neg Hx   . Stomach cancer Neg Hx     Social History Social History   Tobacco Use  . Smoking status: Never Smoker  . Smokeless tobacco: Never Used  Vaping Use  . Vaping Use: Never used  Substance Use Topics  . Alcohol use: No  . Drug use: No     Allergies   Amoxicillin-pot clavulanate, Clarithromycin, Nitrofurantoin, Sulfonamide derivatives, Azithromycin, Nitrofurantoin macrocrystal, and Sulfa antibiotics   Review of Systems Review of Systems PER HPI    Physical Exam Triage Vital Signs ED Triage Vitals  Enc Vitals Group     BP 05/07/20 1333 139/68     Pulse Rate 05/07/20 1333 85     Resp 05/07/20 1333 20     Temp 05/07/20 1333 99.2 F (37.3 C)     Temp Source 05/07/20 1333 Oral     SpO2 05/07/20 1333 94 %     Weight --      Height --      Head Circumference --      Peak Flow --      Pain Score 05/07/20 1328 8     Pain Loc --      Pain Edu? --      Excl. in  [place]? --    No data found.  Updated Vital Signs BP 139/68 (BP Location: Left Arm)   Pulse 85   Temp 99.2 F (37.3 C) (Oral)   Resp 20   SpO2 94%   Visual Acuity Right Eye Distance:   Left Eye Distance:   Bilateral Distance:    Right Eye Near:   Left Eye Near:    Bilateral Near:     Physical Exam Vitals and nursing note reviewed.  Constitutional:      General: She is not in acute distress.    Appearance: Normal appearance. She is well-developed. She is not ill-appearing.  HENT:     Head: Atraumatic.     Mouth/Throat:     Mouth: Mucous membranes are moist.     Pharynx: Oropharynx is clear.  Eyes:     Extraocular Movements: Extraocular movements intact.     Conjunctiva/sclera: Conjunctivae normal.  Cardiovascular:     Rate and Rhythm: Normal rate and regular rhythm.     Heart sounds: Normal heart sounds.  Pulmonary:     Effort: Pulmonary effort is normal.     Breath sounds: Normal breath sounds.  Abdominal:     General: Bowel sounds are normal. There is no distension.     Palpations: Abdomen is soft. There is no mass.     Tenderness: There is abdominal tenderness (significant TTP RUQ). There is guarding. There is no right CVA tenderness, left CVA tenderness or rebound.  Musculoskeletal:        General: Normal range of motion.     Cervical back: Normal range of motion and neck supple.  Skin:    General: Skin is warm and dry.     Findings: No erythema or rash.  Neurological: **Note De-Identified Dy Obfuscation** General: No focal deficit present.     Mental Status: She is alert and oriented to person, place, and time.     Gait: Gait normal.  Psychiatric:        Mood and Affect: Mood normal.        Thought Content: Thought content normal.        Judgment: Judgment normal.      UC Treatments / Results  Labs (all labs ordered are listed, but only abnormal results are displayed) Labs Reviewed - No data to display  EKG   Radiology CT Abdomen Pelvis W Contrast  Result Date:  05/07/2020 CLINICAL DATA:  Right lower quadrant pain. EXAM: CT ABDOMEN AND PELVIS WITH CONTRAST TECHNIQUE: Multidetector CT imaging of the abdomen and pelvis was performed using the standard protocol following bolus administration of intravenous contrast. CONTRAST:  138mL OMNIPAQUE IOHEXOL 300 MG/ML  SOLN COMPARISON:  None. FINDINGS: Lower chest: No acute abnormality. Hepatobiliary: No focal liver abnormality is seen. No gallstones, gallbladder wall thickening, or biliary dilatation. Pancreas: The body and tail of the pancreas are not identified. The remaining visualized portion of the pancreas is unremarkable. Spleen: Normal in size without focal abnormality. Adrenals/Urinary Tract: Adrenal glands are unremarkable. Kidneys are normal in size, without renal calculi or hydronephrosis. A 7 mm cystic appearing areas seen within the mid right kidney. Bladder is unremarkable. Stomach/Bowel: Surgical sutures are seen within the gastric region. The appendix is surgically absent. No evidence of bowel wall thickening, distention, or inflammatory changes. Vascular/Lymphatic: There is mild to moderate severity calcification of the abdominal aorta and bilateral common iliac arteries, without evidence of aneurysmal dilatation. No enlarged abdominal or pelvic lymph nodes. Reproductive: Status post hysterectomy. No adnexal masses. Other: No abdominal wall hernia or abnormality. No abdominopelvic ascites. Musculoskeletal: No acute or significant osseous findings. IMPRESSION: 1. Evidence of prior gastric bypass surgery. 2. Small right renal cyst. 3. Aortic atherosclerosis. Aortic Atherosclerosis (ICD10-I70.0). Electronically Signed   By: Virgina Norfolk M.D.   On: 05/07/2020 19:14   DG Chest Port 1 View  Result Date: 05/07/2020 CLINICAL DATA:  Right upper quadrant pain with body aches EXAM: PORTABLE CHEST 1 VIEW COMPARISON:  08/26/2018 FINDINGS: Coarse chronic interstitial opacity. No focal consolidation or effusion. Normal  cardiomediastinal silhouette. No pneumothorax. IMPRESSION: No active disease. Electronically Signed   By: Donavan Foil M.D.   On: 05/07/2020 16:33    Procedures Procedures (including critical care time)  Medications Ordered in UC Medications  ondansetron (ZOFRAN-ODT) disintegrating tablet 4 mg (4 mg Oral Given 05/07/20 1423)  ketorolac (TORADOL) injection 30 mg (30 mg Intramuscular Given 05/07/20 1423)    Initial Impression / Assessment and Plan / UC Course  I have reviewed the triage vital signs and the nursing notes.  Pertinent labs & imaging results that were available during my care of the patient were reviewed by me and considered in my medical decision making (see chart for details).     Overall, vital signs stable today and exam concerning only for her RUQ pain at this time but taking into consideration the severity of her abdominal pain, worsening course, and her numerous other generalized sxs feel she would benefit from ER workup for imaging and stat labs. She is agreeable to this and states this is what her PCP had advised initially but she had left the ED waiting room to come be evaluated here first. She declines EMS and will have her husband drive her immediately over. Report called to [facility] where she **Note De-Identified Cyran Obfuscation** prefers to go.  IM toradol given for pain and zofran for her nausea prior to discharge.   Final Clinical Impressions(s) / UC Diagnoses   Final diagnoses:  RUQ abdominal pain  Nausea without vomiting  Loss of weight  Fatigue, unspecified type  Weakness   Discharge Instructions   None    ED Prescriptions    None     PDMP not reviewed this encounter.   Volney American, [place] 05/08/20 [place], [place], PA-C 05/08/20 [phone]

## 2020-05-09 LAB — URINE CULTURE

## 2020-05-17 ENCOUNTER — Other Ambulatory Visit: Payer: Self-pay

## 2020-05-17 ENCOUNTER — Ambulatory Visit (AMBULATORY_SURGERY_CENTER): Payer: BLUE CROSS/BLUE SHIELD | Admitting: Internal Medicine

## 2020-05-17 ENCOUNTER — Encounter: Payer: Self-pay | Admitting: Internal Medicine

## 2020-05-17 VITALS — BP 114/62 | HR 101 | Temp 97.3°F | Resp 18 | Ht 64.0 in | Wt 174.0 lb

## 2020-05-17 DIAGNOSIS — Z1211 Encounter for screening for malignant neoplasm of colon: Secondary | ICD-10-CM | POA: Diagnosis not present

## 2020-05-17 DIAGNOSIS — Z86010 Personal history of colonic polyps: Secondary | ICD-10-CM

## 2020-05-17 DIAGNOSIS — D120 Benign neoplasm of cecum: Secondary | ICD-10-CM | POA: Diagnosis not present

## 2020-05-17 DIAGNOSIS — R1011 Right upper quadrant pain: Secondary | ICD-10-CM

## 2020-05-17 MED ORDER — SODIUM CHLORIDE 0.9 % IV SOLN: 500.0000 mL | Freq: Once | INTRAVENOUS | Status: DC

## 2020-05-17 NOTE — Progress Notes (Signed)
**Note De-Identified [place] Obfuscation** Pt's states no medical or surgical changes since previsit or office visit.  VS SP

## 2020-05-17 NOTE — Progress Notes (Signed)
**Note De-identified [place] Obfuscation** pt tolerated well. VSS. awake and to recovery. Report given to RN.  

## 2020-05-17 NOTE — Op Note (Signed)
**Note De-Identified Toren Obfuscation** [place] Patient Name: Felicia Schultz Procedure Date: 05/17/2020 11:01 AM MRN: [id number] Endoscopist: Gatha Mayer , MD Age: 69 Referring MD:  Date of Birth: [birth year] Gender: Female Account #: [id number] Procedure:                Colonoscopy Indications:              High risk colon cancer surveillance: Personal                            history of sessile serrated colon polyp (less than                            10 mm in size) with no dysplasia, Last colonoscopy:                            2015 Medicines:                Propofol per Anesthesia Procedure:                Pre-Anesthesia Assessment:                           - Prior to the procedure, a History and Physical                            was performed, and patient medications and                            allergies were reviewed. The patient's tolerance of                            previous anesthesia was also reviewed. The risks                            and benefits of the procedure and the sedation                            options and risks were discussed with the patient.                            All questions were answered, and informed consent                            was obtained. Prior Anticoagulants: The patient has                            taken no previous anticoagulant or antiplatelet                            agents. ASA Grade Assessment: III - A patient with                            severe systemic disease. After reviewing the risks **Note De-Identified [place] Obfuscation** and benefits, the patient was deemed in                            satisfactory condition to undergo the procedure.                           After obtaining informed consent, the colonoscope                            was passed under direct vision. Throughout the                            procedure, the patient's blood pressure, pulse, and                            oxygen saturations were monitored continuously. The                             Colonoscope was introduced through the anus and                            advanced to the the cecum, identified by                            appendiceal orifice and ileocecal valve. The                            colonoscopy was performed without difficulty. The                            patient tolerated the procedure well. The quality                            of the bowel preparation was excellent. The                            ileocecal valve, appendiceal orifice, and rectum                            were photographed. The bowel preparation used was                            SUPREP Krock split dose instruction. Scope In: 11:18:12 AM Scope Out: 11:31:49 AM Scope Withdrawal Time: 0 hours 10 minutes 28 seconds  Total Procedure Duration: 0 hours 13 minutes 37 seconds  Findings:                 The perianal and digital rectal examinations were                            normal.                           A 1 mm polyp was found in the cecum. The polyp was **Note De-Identified Lona Obfuscation** sessile. The polyp was removed with a cold biopsy                            forceps. Resection and retrieval were complete.                            Verification of patient identification for the                            specimen was done. Estimated blood loss was minimal.                           The exam was otherwise without abnormality on                            direct and retroflexion views. Complications:            No immediate complications. Estimated Blood Loss:     Estimated blood loss was minimal. Impression:               - One 1 mm polyp in the cecum, removed with a cold                            biopsy forceps. Resected and retrieved.                           - The examination was otherwise normal on direct                            and retroflexion views.                           - Personal history of colonic polyps. 2 subcm ssp                             2015 Recommendation:           - Patient has a contact number available for                            emergencies. The signs and symptoms of potential                            delayed complications were discussed with the                            patient. Return to normal activities tomorrow.                            Written discharge instructions were provided to the                            patient.                           - Resume previous diet.                           - **Note De-Identified Denz Obfuscation** Continue present medications.                           - Repeat colonoscopy is recommended. The                            colonoscopy date will be determined after pathology                            results from today's exam become available for                            review. Gatha Mayer, MD 05/17/2020 11:46:35 AM This report has been signed electronically.

## 2020-05-17 NOTE — Patient Instructions (Addendum)
**Note De-Identified [place] Obfuscation** The upper endoscopy exam showed normal postoperative anatomy.  No ulcers or other abnormalities that would explain your pain.  As we discussed I am going to get an ultrasound to look at the gallbladder.  My staff will set that up and you should hear by next week about that.  I found 1 tiny polyp in the colon that might have been a precancerous polyp, I removed it and we will get it analyzed.  There is nothing bad going on, everything else looked great.  I will let you know the results and when a routine repeat colonoscopy would be recommended.  I think you could consider trying your cyclobenzaprine also known as Flexeril for this pain to see if it helps some.  I appreciate the opportunity to care for you. Gatha Mayer, MD, [facility]   .YOU HAD AN ENDOSCOPIC PROCEDURE TODAY AT [facility]:   Refer to the procedure report that was given to you for any specific questions about what was found during the examination.  If the procedure report does not answer your questions, please call your gastroenterologist to clarify.  If you requested that your care partner not be given the details of your procedure findings, then the procedure report has been included in a sealed envelope for you to review at your convenience later.  YOU SHOULD EXPECT: Some feelings of bloating in the abdomen. Passage of more gas than usual.  Walking can help get rid of the air that was put into your GI tract during the procedure and reduce the bloating. If you had a lower endoscopy (such as a colonoscopy or flexible sigmoidoscopy) you may notice spotting of blood in your stool or on the toilet paper. If you underwent a bowel prep for your procedure, you may not have a normal bowel movement for a few days.  Please Note:  You might notice some irritation and congestion in your nose or some drainage.  This is from the oxygen used during your procedure.  There is no need for concern and it should clear up in a day or  so.  SYMPTOMS TO REPORT IMMEDIATELY:   Following lower endoscopy (colonoscopy or flexible sigmoidoscopy):  Excessive amounts of blood in the stool  Significant tenderness or worsening of abdominal pains  Swelling of the abdomen that is new, acute  Fever of 100F or higher   Following upper endoscopy (EGD)  Vomiting of blood or coffee ground material  New chest pain or pain under the shoulder blades  Painful or persistently difficult swallowing  New shortness of breath  Fever of 100F or higher  Black, tarry-looking stools  For urgent or emergent issues, a gastroenterologist can be reached at any hour by calling [phone]. Do not use MyChart messaging for urgent concerns.    DIET:  We do recommend a small meal at first, but then you may proceed to your regular diet.  Drink plenty of fluids but you should avoid alcoholic beverages for 24 hours.  ACTIVITY:  You should plan to take it easy for the rest of today and you should NOT DRIVE or use heavy machinery until tomorrow (because of the sedation medicines used during the test).    FOLLOW UP: Our staff will call the number listed on your records 48-72 hours following your procedure to check on you and address any questions or concerns that you may have regarding the information given to you following your procedure. If we do not reach you, we will leave **Note De-Identified [place] Obfuscation** a message.  We will attempt to reach you two times.  During this call, we will ask if you have developed any symptoms of COVID 19. If you develop any symptoms (ie: fever, flu-like symptoms, shortness of breath, cough etc.) before then, please call [phone].  If you test positive for Covid 19 in the 2 weeks post procedure, please call and report this information to [place].    If any biopsies were taken you will be contacted by phone or by letter within the next 1-3 weeks.  Please call us at [phone] if you have not heard about the biopsies in 3 weeks.     SIGNATURES/CONFIDENTIALITY: You and/or your care partner have signed paperwork which will be entered into your electronic medical record.  These signatures attest to the fact that that the information above on your After Visit Summary has been reviewed and is understood.  Full responsibility of the confidentiality of this discharge information lies with you and/or your care-partner.

## 2020-05-17 NOTE — Op Note (Signed)
**Note De-Identified Mistry Obfuscation** [place] Patient Name: Felicia Schultz Procedure Date: 05/17/2020 11:01 AM MRN: [id number] Endoscopist: Gatha Mayer , MD Age: 69 Referring MD:  Date of Birth: [birth year] Gender: Female Account #: [id number] Procedure:                Upper GI endoscopy Indications:              Abdominal pain in the right upper quadrant Medicines:                Propofol per Anesthesia, Monitored Anesthesia Care Procedure:                Pre-Anesthesia Assessment:                           - Prior to the procedure, a History and Physical                            was performed, and patient medications and                            allergies were reviewed. The patient's tolerance of                            previous anesthesia was also reviewed. The risks                            and benefits of the procedure and the sedation                            options and risks were discussed with the patient.                            All questions were answered, and informed consent                            was obtained. Prior Anticoagulants: The patient has                            taken no previous anticoagulant or antiplatelet                            agents. ASA Grade Assessment: III - A patient with                            severe systemic disease. After reviewing the risks                            and benefits, the patient was deemed in                            satisfactory condition to undergo the procedure.                           After obtaining informed consent, the endoscope was **Note De-Identified Carrara Obfuscation** passed under direct vision. Throughout the                            procedure, the patient's blood pressure, pulse, and                            oxygen saturations were monitored continuously. The                            Endoscope was introduced through the mouth, and                            advanced to the efferent jejunal loop. The upper GI                             endoscopy was accomplished without difficulty. The                            patient tolerated the procedure well. Scope In: Scope Out: Findings:                 The examined esophagus was normal.                           Evidence of a gastric bypass was found. A gastric                            pouch with a 6 cm length from the GE junction to                            the gastrojejunal anastomosis was found. The                            gastrojejunal anastomosis was characterized by                            healthy appearing mucosa. This was traversed. The                            pouch-to-jejunum limb was characterized by healthy                            appearing mucosa.                           The examined jejunum was normal.                           The cardia and gastric fundus were normal on                            retroflexion. Complications:            No immediate complications. Estimated Blood Loss:     Estimated blood loss: none. Impression:               - **Note De-Identified Brumley Obfuscation** Normal esophagus.                           - Gastric bypass with a pouch 6 cm in length.                            Gastrojejunal anastomosis characterized by healthy                            appearing mucosa.                           - Normal examined jejunum.                           - No specimens collected. Recommendation:           - Patient has a contact number available for                            emergencies. The signs and symptoms of potential                            delayed complications were discussed with the                            patient. Return to normal activities tomorrow.                            Written discharge instructions were provided to the                            patient.                           - Resume previous diet.                           - Continue present medications.                           - See the other procedure note for  documentation of                            additional recommendations.                           - WILL SCHEDULE RUQ [place] RE: RUQ PAIN                           SHE COULD TRY HER FLEXERIL FOR THIS PAIN SHE HAS -                            SOME SXS SUGGEST MUSCULOSKELETAL TO ME Gatha Mayer, MD 05/17/2020 11:43:58 AM This report has been signed electronically.

## 2020-05-21 ENCOUNTER — Telehealth: Payer: Self-pay

## 2020-05-21 ENCOUNTER — Telehealth: Payer: Self-pay | Admitting: *Deleted

## 2020-05-21 DIAGNOSIS — R1011 Right upper quadrant pain: Secondary | ICD-10-CM

## 2020-05-21 NOTE — Telephone Encounter (Signed)
**Note De-Identified Mccullar Obfuscation** Per procedure report patient need RUQ [place] for RUQ pain.  Has been scheduled for 10:30 on 05/28/20 at [facility].  NPO after midnight.  Patient notified of the appt date and time and instructions

## 2020-05-21 NOTE — Telephone Encounter (Signed)
**Note De-Identified Mundis Obfuscation** 1. Have you developed a fever since your procedure? no  2.   Have you had an respiratory symptoms (SOB or cough) since your procedure? no  3.   Have you tested positive for COVID 19 since your procedure no  4.   Have you had any family members/close contacts diagnosed with the COVID 19 since your procedure?  no   If yes to any of these questions please route to Joylene John, RN and Joella Prince, RN Follow up Call-  Call back number 05/17/2020  Post procedure Call Back phone  # [phone]  Permission to leave phone message Yes  Some recent data might be hidden     Patient questions:  Do you have a fever, pain , or abdominal swelling? No. Pain Score  0 *  Have you tolerated food without any problems? Yes.    Have you been able to return to your normal activities? Yes.    Do you have any questions about your discharge instructions: Diet   No. Medications  No. Follow up visit  No.  Do you have questions or concerns about your Care? No.  Actions: * If pain score is 4 or above: No action needed, pain <4.

## 2020-05-28 ENCOUNTER — Ambulatory Visit (HOSPITAL_COMMUNITY): Payer: BLUE CROSS/BLUE SHIELD

## 2020-05-29 ENCOUNTER — Other Ambulatory Visit: Payer: Self-pay

## 2020-05-29 ENCOUNTER — Ambulatory Visit (HOSPITAL_COMMUNITY)
Admission: RE | Admit: 2020-05-29 | Discharge: 2020-05-29 | Disposition: A | Discharge status: Home / Self Care | Payer: BLUE CROSS/BLUE SHIELD | Source: Ambulatory Visit | Attending: Internal Medicine | Admitting: Internal Medicine

## 2020-05-29 ENCOUNTER — Encounter: Payer: Self-pay | Admitting: Internal Medicine

## 2020-05-29 DIAGNOSIS — K8020 Calculus of gallbladder without cholecystitis without obstruction: Secondary | ICD-10-CM | POA: Diagnosis not present

## 2020-05-29 DIAGNOSIS — R1011 Right upper quadrant pain: Secondary | ICD-10-CM

## 2020-05-29 DIAGNOSIS — R109 Unspecified abdominal pain: Secondary | ICD-10-CM | POA: Diagnosis not present

## 2020-05-30 ENCOUNTER — Telehealth: Payer: Self-pay

## 2020-05-30 NOTE — Telephone Encounter (Signed)
**Note De-identified Gadsby Obfuscation** Left message for patient to please call back. 

## 2020-05-30 NOTE — Telephone Encounter (Signed)
**Note De-identified Aldaz Obfuscation** -----  **Note De-Identified Pates Obfuscation** Message from Gatha Mayer, MD sent at 05/29/2020  3:41 PM EST ----- There is a gallstone in the gallbladder.  This could have been the cause of her right upper quadrant pain that led her to the ER recently.  Cannot be sure but would consider having the gallbladder removed.  I would recommend she see a surgeon so we could refer her to [facility] to consider cholecystectomy.  I would have her see one of the surgeons that does bariatric surgery since she has had that before.  Please let her know the colon polyp was benign she will get a letter about that as well.  Let me know if any questions

## 2020-06-07 NOTE — Telephone Encounter (Signed)
**Note De-Identified Biffle Obfuscation** Inbound call from patient returning your call.  States she called [facility] and they informed her they do not participate with her insurance.  Wants to know if referral can be sent to another practice.  Patient can be reached at work phone number.

## 2020-06-07 NOTE — Telephone Encounter (Signed)
**Note De-Identified Zerbe Obfuscation** Patient is out of network with [facility]. I spoke with the patient and advised her we will send her to a [facility].  I attempted to reach their office.  Their phones are "having technical difficulties" according to the message.  I left a VM and faxed the referral. Patient is aware as soon as I hear back I will call her with the appt date and time.  Phone to General surgery [facility] [phone] fax [phone]

## 2020-06-12 NOTE — Telephone Encounter (Signed)
**Note De-Identified Alles Obfuscation** I received a call from [facility] today.  They were returning my call from last week.  They asked that I fax the referral to an alternate fax at [phone].  I have done this.  I left a message for the patient relaying this information. She is asked to call back if she needs additional help. She will be contacted directly by [facility] with an appt.

## 2020-06-12 NOTE — Telephone Encounter (Signed)
**Note De-Identified Tonkovich Obfuscation** Pt is requesting a call back from a nurse to discuss the referral she needs.

## 2020-06-22 DIAGNOSIS — K8020 Calculus of gallbladder without cholecystitis without obstruction: Secondary | ICD-10-CM | POA: Diagnosis not present

## 2020-07-13 ENCOUNTER — Other Ambulatory Visit: Payer: Self-pay | Admitting: Family Medicine

## 2020-07-19 DIAGNOSIS — K8020 Calculus of gallbladder without cholecystitis without obstruction: Secondary | ICD-10-CM | POA: Diagnosis not present

## 2020-07-19 DIAGNOSIS — K8010 Calculus of gallbladder with chronic cholecystitis without obstruction: Secondary | ICD-10-CM | POA: Diagnosis not present

## 2020-07-19 DIAGNOSIS — Z9884 Bariatric surgery status: Secondary | ICD-10-CM | POA: Diagnosis not present

## 2020-08-03 ENCOUNTER — Telehealth (INDEPENDENT_AMBULATORY_CARE_PROVIDER_SITE_OTHER): Payer: BLUE CROSS/BLUE SHIELD | Admitting: Family Medicine

## 2020-08-03 VITALS — Ht 64.0 in | Wt 178.0 lb

## 2020-08-03 DIAGNOSIS — R739 Hyperglycemia, unspecified: Secondary | ICD-10-CM | POA: Diagnosis not present

## 2020-08-03 DIAGNOSIS — Z13220 Encounter for screening for lipoid disorders: Secondary | ICD-10-CM

## 2020-08-03 DIAGNOSIS — N959 Unspecified menopausal and perimenopausal disorder: Secondary | ICD-10-CM | POA: Diagnosis not present

## 2020-08-03 DIAGNOSIS — J309 Allergic rhinitis, unspecified: Secondary | ICD-10-CM

## 2020-08-03 DIAGNOSIS — Z9884 Bariatric surgery status: Secondary | ICD-10-CM

## 2020-08-03 DIAGNOSIS — Z8679 Personal history of other diseases of the circulatory system: Secondary | ICD-10-CM

## 2020-08-03 DIAGNOSIS — M47812 Spondylosis without myelopathy or radiculopathy, cervical region: Secondary | ICD-10-CM

## 2020-08-03 DIAGNOSIS — E559 Vitamin D deficiency, unspecified: Secondary | ICD-10-CM

## 2020-08-03 MED ORDER — CYCLOBENZAPRINE HCL 5 MG PO TABS: 5.0000 mg | ORAL_TABLET | Freq: Three times a day (TID) | ORAL | 5 refills | Status: DC | PRN

## 2020-08-03 MED ORDER — AZELASTINE HCL 0.1 % NA SOLN
1.0000 | Freq: Two times a day (BID) | NASAL | 11 refills | Status: DC
Start: 2020-08-03 — End: 2021-07-16

## 2020-08-03 MED ORDER — CITALOPRAM HYDROBROMIDE 40 MG PO TABS
40.0000 mg | ORAL_TABLET | Freq: Every day | ORAL | 3 refills | Status: DC
Start: 2020-08-03 — End: 2021-07-16

## 2020-08-03 MED ORDER — ESTRADIOL 0.1 MG/GM VA CREA: 1.0000 | TOPICAL_CREAM | Freq: Every day | VAGINAL | 0 refills | Status: DC

## 2020-08-03 MED ORDER — GABAPENTIN 300 MG PO CAPS
ORAL_CAPSULE | ORAL | 2 refills | Status: DC
Start: 2020-08-03 — End: 2021-07-16

## 2020-08-03 MED ORDER — MONTELUKAST SODIUM 10 MG PO TABS
10.0000 mg | ORAL_TABLET | Freq: Every day | ORAL | 3 refills | Status: DC
Start: 2020-08-03 — End: 2021-07-16

## 2020-08-03 MED ORDER — VITAMIN D (ERGOCALCIFEROL) 1.25 MG (50000 UNIT) PO CAPS: ORAL_CAPSULE | ORAL | 0 refills | Status: DC

## 2020-08-03 MED ORDER — ESTROGENS CONJUGATED 0.625 MG PO TABS
0.6250 mg | ORAL_TABLET | Freq: Every day | ORAL | 1 refills | Status: DC
Start: 2020-08-03 — End: 2021-07-16

## 2020-08-03 MED ORDER — GABAPENTIN 100 MG PO CAPS
ORAL_CAPSULE | ORAL | 2 refills | Status: DC
Start: 2020-08-03 — End: 2021-07-16

## 2020-08-03 NOTE — Assessment & Plan Note (Signed)
**Note De-Identified Kovacic Obfuscation** Stable.  Continue Celexa 40 mg daily.  Will refill Premarin she is using very sparingly.  Also refill Estrace cream.  She is following with gynecology currently for this.

## 2020-08-03 NOTE — Assessment & Plan Note (Signed)
**Note De-Identified Seibold Obfuscation** Stable.  Continue Singulair 10 mg daily and Astelin nasal spray.

## 2020-08-03 NOTE — Assessment & Plan Note (Signed)
**Note De-Identified Renn Obfuscation** Check A1c next blood draw.

## 2020-08-03 NOTE — Assessment & Plan Note (Signed)
**Note De-Identified Blystone Obfuscation** Will refill Flexeril and gabapentin.

## 2020-08-03 NOTE — Assessment & Plan Note (Signed)
**Note De-Identified Gittens Obfuscation** Check B12 and labs.

## 2020-08-03 NOTE — Progress Notes (Signed)
**Note De-identified Steadham Obfuscation**   **Note De-Identified Nickell Obfuscation** Felicia Schultz Felicia Schultz is a 70 y.o. female who presents today for a virtual office visit.  Assessment/Plan:  Chronic Problems Addressed Today: Vitamin D deficiency Refill vitamin D.  She will come in to check vitamin D level seen.  Arthritis of neck Will refill Flexeril and gabapentin.  Allergic rhinitis Stable.  Continue Singulair 10 mg daily and Astelin nasal spray.  Hyperglycemia Check A1c next blood draw.  S/P gastric bypass Check B12 and labs.   Postmenopausal symptoms Stable.  Continue Celexa 40 mg daily.  Will refill Premarin she is using very sparingly.  Also refill Estrace cream.  She is following with gynecology currently for this.  History of atrial fibrillation Status post ablation.  Has had a couple episodes of fluttering.  Advised her to follow-up with cardiology soon.  Also recommended she start baby aspirin until she sees them.  Discussed return cautions and reasons to go to the ED.     Subjective:  HPI:  See A/p.         Objective/Observations  Physical Exam: Gen: NAD, resting comfortably Pulm: Normal work of breathing Neuro: Grossly normal, moves all extremities Psych: Normal affect and thought content  Virtual Visit Mendez Video   I connected with Michael Ventresca Ringler on 08/03/20 at  3:00 PM EST by a video enabled telemedicine application and verified that I am speaking with the correct person using two identifiers. The limitations of evaluation and management by telemedicine and the availability of in person appointments were discussed. The patient expressed understanding and agreed to proceed.   Patient location: Home Provider location: [place] participating in the virtual visit: Myself and Patient     Algis Greenhouse. Jerline Pain, MD 08/03/2020 3:41 PM

## 2020-08-03 NOTE — Assessment & Plan Note (Signed)
**Note De-Identified Derocher Obfuscation** Refill vitamin D.  She will come in to check vitamin D level seen.

## 2020-08-03 NOTE — Assessment & Plan Note (Signed)
**Note De-Identified Totten Obfuscation** Status post ablation.  Has had a couple episodes of fluttering.  Advised her to follow-up with cardiology soon.  Also recommended she start baby aspirin until she sees them.  Discussed return cautions and reasons to go to the ED.

## 2020-09-06 ENCOUNTER — Ambulatory Visit: Payer: BLUE CROSS/BLUE SHIELD | Admitting: Physical Therapy

## 2020-09-07 ENCOUNTER — Encounter: Payer: BLUE CROSS/BLUE SHIELD | Admitting: Physical Therapy

## 2020-09-10 ENCOUNTER — Ambulatory Visit: Payer: BLUE CROSS/BLUE SHIELD | Admitting: Physical Therapy

## 2020-12-19 ENCOUNTER — Other Ambulatory Visit: Payer: Self-pay | Admitting: Family Medicine

## 2021-01-17 ENCOUNTER — Encounter: Payer: Self-pay | Admitting: Family Medicine

## 2021-01-17 ENCOUNTER — Telehealth (INDEPENDENT_AMBULATORY_CARE_PROVIDER_SITE_OTHER): Payer: BLUE CROSS/BLUE SHIELD | Admitting: Family Medicine

## 2021-01-17 DIAGNOSIS — R6889 Other general symptoms and signs: Secondary | ICD-10-CM

## 2021-01-17 DIAGNOSIS — R0600 Dyspnea, unspecified: Secondary | ICD-10-CM

## 2021-01-17 DIAGNOSIS — R059 Cough, unspecified: Secondary | ICD-10-CM

## 2021-01-17 NOTE — Progress Notes (Signed)
**Note De-Identified Manigo Obfuscation** Virtual Visit [name] Video Note  I connected with Felicia Schultz  on 01/17/21 at 12:20 PM EDT by a video enabled telemedicine application and verified that I am speaking with the correct person using two identifiers.  Location patient: home, [place] Location provider:work or home office Persons participating in the virtual visit: patient, provider, husband is there as well  I discussed the limitations of evaluation and management by telemedicine and the availability of in person appointments. The patient expressed understanding and agreed to proceed.   HPI:  Acute telemedicine visit for SOB: -Onset: 3-4 days ago; did a home covid test yesterday which was negative -Symptoms include: SOB - reports feels like she may pass out at times when not at rest as "struggles to breath", chest tightness at times, nasal congestion, sore throat, fatigue, ha, cough, nausea and diarrhea, body aches -Denies: fever,inability to eat/drink/get out of better -no known sick contacts - but was around a lot of people last week - no one had masks on -Pertinent past medical history: see below -Pertinent medication allergies:  Allergies  Allergen Reactions   Amoxicillin-Pot Clavulanate Nausea Only    Severe nausea. Couldn't keep medication down  Severe nausea. Couldn't keep medication down   Clarithromycin Hives   Nitrofurantoin Other (See Comments) and Rash    Unknown Palms and hand itching, turned bright red per pt   Sulfonamide Derivatives Other (See Comments)    Abdominal cramping, nausea   Azithromycin Other (See Comments)    Z-pack, doesn't do anything Z-pack, doesn't do anything Z-pack, doesn't do anything Z-pack, doesn't do anything    Nitrofurantoin Macrocrystal Swelling   Sulfa Antibiotics Nausea And Vomiting and Other (See Comments)    Unknown  -COVID-19 vaccine status: 2 doses and a booster  ROS: See pertinent positives and negatives per HPI.  Past Medical History:  Diagnosis Date   Allergy    Atrial  flutter ([place])    typical appearing   Basal cell carcinoma     nose - 1985   GERD (gastroesophageal reflux disease)    H/O cardiac radiofrequency ablation 2015   H/O gastric bypass 2005   History of diabetes mellitus 11/13/2015   Hypertension    Hypokalemia    Osteoporosis 2000   Paroxysmal atrial fibrillation (HCC)    Personal history of colonic polyps - sessile serrated 11/23/2013    Past Surgical History:  Procedure Laterality Date   APPENDECTOMY     ATRIAL FLUTTER ABLATION N/A 09/05/2014   Procedure: ATRIAL FLUTTER ABLATION;  Surgeon: Thompson Grayer, MD;  [facility]: [facility] CATH LAB;  Service: Cardiovascular;  Laterality: N/A;   BREAST EXCISIONAL BIOPSY Right 01/2016   CARPAL TUNNEL RELEASE Bilateral    FOOT SURGERY Bilateral 2012   shorten bones in both feet   GASTRIC BYPASS     removal of ovary Right 1987   SPINE SURGERY  2000/2012   anterior cervical fusion with plating   TOTAL ABDOMINAL HYSTERECTOMY  1999   WRIST SURGERY Right 2018   Dr. Apolonio Schneiders -ganglion excision     Current Outpatient Medications:    azelastine (ASTELIN) 0.1 % nasal spray, Place 1-2 sprays into both nostrils 2 (two) times daily. Use in each nostril as directed, Disp: 30 mL, Rfl: 11   citalopram (CELEXA) 40 MG tablet, Take 1 tablet (40 mg total) by mouth daily., Disp: 90 tablet, Rfl: 3   Cyanocobalamin (VITAMIN B 12 PO), Take 1 tablet by mouth daily., Disp: , Rfl:    cyclobenzaprine (FLEXERIL) 5 MG tablet, Take 1 **Note De-Identified Aspinwall Obfuscation** tablet (5 mg total) by mouth 3 (three) times daily as needed for muscle spasms., Disp: 60 tablet, Rfl: 5   Diclofenac Sodium 2 % SOLN, Apply 1 Pump topically 3 (three) times daily as needed., Disp: 112 g, Rfl: 5   estradiol (ESTRACE) 0.1 MG/GM vaginal cream, Place 1 Applicatorful vaginally at bedtime., Disp: 42.5 g, Rfl: 0   estrogens, conjugated, (PREMARIN) 0.625 MG tablet, Take 1 tablet (0.625 mg total) by mouth daily., Disp: 90 tablet, Rfl: 1   gabapentin (NEURONTIN) 100 MG capsule, Take 2-3 capsules  (200-300 mg total) by mouth at bedtime. May also take 1 capsule (100 mg total) 3 (three) times daily as needed., Disp: 120 capsule, Rfl: 2   gabapentin (NEURONTIN) 300 MG capsule, Take 1 capsule, orally, up to TID or as directed, Disp: 90 capsule, Rfl: 2   Menatetrenone (VITAMIN K2) 100 MCG TABS, Take 100 mcg by mouth daily., Disp: 90 tablet, Rfl: 5   montelukast (SINGULAIR) 10 MG tablet, Take 1 tablet (10 mg total) by mouth at bedtime., Disp: 30 tablet, Rfl: 3   Multiple Vitamins-Minerals (MULTIVITAMIN GUMMIES ADULT PO), Take 1 tablet by mouth daily., Disp: , Rfl:    Vitamin D, Ergocalciferol, (DRISDOL) 1.25 MG (50000 UNIT) CAPS capsule, TAKE ONE CAPSULE BY MOUTH EVERY 7 DAYS, Disp: 12 capsule, Rfl: 0  EXAM:  VITALS per patient if applicable:  GENERAL: alert, oriented, appears well and in no acute distress  HEENT: atraumatic, conjunttiva clear, no obvious abnormalities on inspection of external nose and ears  NECK: normal movements of the head and neck  LUNGS: on inspection no signs of respiratory distress, breathing rate appears normal, no obvious gross SOB, gasping or wheezing  CV: no obvious cyanosis  MS: moves all visible extremities without noticeable abnormality  PSYCH/NEURO: pleasant and cooperative, no obvious depression or anxiety, speech and thought processing grossly intact  ASSESSMENT AND PLAN:  Discussed the following assessment and plan:  Dyspnea, unspecified type  Cough  Flu-like symptoms  -we discussed possible serious and likely etiologies, options for evaluation and workup, limitations of telemedicine visit vs in person visit, treatment, treatment risks and precautions. Query COVID19, influenza vs other. Given she is reporting difficulty breathing, presyncope and chest discomfort advised prompt inperson evaluation at a higher level of care. She prefers to go by private vehicle to a [facility]. She reports her husband will drive her and feels safe going this  route.   I discussed the assessment and treatment plan with the patient. The patient was provided an opportunity to ask questions and all were answered. The patient agreed with the plan and demonstrated an understanding of the instructions.     Lucretia Kern, DO

## 2021-01-17 NOTE — Patient Instructions (Signed)
**Note De-Identified Franchini Obfuscation** Seek inperson medical evaluation right away today as we discussed. If you are experiencing worsening, severe or life threatening symptoms please call 911 and seek emergency care.     I hope you are feeling better soon!   It was nice to meet you today. I help [place] out with telemedicine visits on Tuesdays and Thursdays and am available for visits on those days. If you have any concerns or questions following this visit please schedule a follow up visit with your Primary Care doctor or seek care at a local urgent care clinic to avoid delays in care.

## 2021-01-17 NOTE — Telephone Encounter (Signed)
**Note De-Identified Lavell Obfuscation** Spoke with patient informed her how to send attachment [employer]

## 2021-04-23 ENCOUNTER — Encounter (INDEPENDENT_AMBULATORY_CARE_PROVIDER_SITE_OTHER): Payer: BLUE CROSS/BLUE SHIELD | Admitting: Ophthalmology

## 2021-05-13 ENCOUNTER — Other Ambulatory Visit: Payer: Self-pay

## 2021-05-13 ENCOUNTER — Ambulatory Visit (INDEPENDENT_AMBULATORY_CARE_PROVIDER_SITE_OTHER): Payer: BC Managed Care – PPO | Admitting: Ophthalmology

## 2021-05-13 ENCOUNTER — Encounter (INDEPENDENT_AMBULATORY_CARE_PROVIDER_SITE_OTHER): Payer: Self-pay | Admitting: Ophthalmology

## 2021-05-13 DIAGNOSIS — E119 Type 2 diabetes mellitus without complications: Secondary | ICD-10-CM | POA: Diagnosis not present

## 2021-05-13 DIAGNOSIS — H2511 Age-related nuclear cataract, right eye: Secondary | ICD-10-CM

## 2021-05-13 DIAGNOSIS — H43812 Vitreous degeneration, left eye: Secondary | ICD-10-CM

## 2021-05-13 DIAGNOSIS — H2512 Age-related nuclear cataract, left eye: Secondary | ICD-10-CM | POA: Diagnosis not present

## 2021-05-13 DIAGNOSIS — H43811 Vitreous degeneration, right eye: Secondary | ICD-10-CM

## 2021-05-13 NOTE — Assessment & Plan Note (Signed)
**Note De-identified Goers Obfuscation** Physiologic OD 

## 2021-05-13 NOTE — Assessment & Plan Note (Signed)
**Note De-identified Cubero Obfuscation** The patient has diabetes without any evidence of retinopathy. The patient advised to maintain good blood glucose control, excellent blood pressure control, and favorable levels of cholesterol, low density lipoprotein, and high density lipoproteins. Follow up in 1 year was recommended. Explained that fluctuations in visual acuity , or "out of focus", may result from large variations of blood sugar control. 

## 2021-05-13 NOTE — Assessment & Plan Note (Signed)
**Note De-Identified Pemble Obfuscation** OS and OD with early color changes consistent with age for [facility],  I described to the patient that the early color changes like wearing dark sunglasses

## 2021-05-13 NOTE — Progress Notes (Signed)
**Note De-Identified Cunliffe Obfuscation** 05/13/2021     CHIEF COMPLAINT Patient presents for  Chief Complaint  Patient presents with   Retina Follow Up      HISTORY OF PRESENT ILLNESS: Felicia Schultz is a 70 y.o. female who presents to the clinic today for:   HPI     Retina Follow Up   Patient presents with  PVD.  In both eyes.  This started 1 year ago.  Duration of 1 year.  Since onset it is gradually worsening.        Comments   1 year f/u OU with OCT  Pt c/o gradually worsening distance and near vision in both eyes over the past year.      Last edited by Reather Littler, COA on 05/13/2021  1:53 PM.      Referring physician: Vivi Barrack, MD [address] [place],  [place] [address]  HISTORICAL INFORMATION:   Selected notes from the MEDICAL RECORD NUMBER    Lab Results  Component Value Date   HGBA1C 5.9 02/18/2019     CURRENT MEDICATIONS: No current outpatient medications on file. (Ophthalmic Drugs)   No current facility-administered medications for this visit. (Ophthalmic Drugs)   Current Outpatient Medications (Other)  Medication Sig   azelastine (ASTELIN) 0.1 % nasal spray Place 1-2 sprays into both nostrils 2 (two) times daily. Use in each nostril as directed   citalopram (CELEXA) 40 MG tablet Take 1 tablet (40 mg total) by mouth daily.   Cyanocobalamin (VITAMIN B 12 PO) Take 1 tablet by mouth daily.   cyclobenzaprine (FLEXERIL) 5 MG tablet Take 1 tablet (5 mg total) by mouth 3 (three) times daily as needed for muscle spasms.   Diclofenac Sodium 2 % SOLN Apply 1 Pump topically 3 (three) times daily as needed.   estradiol (ESTRACE) 0.1 MG/GM vaginal cream Place 1 Applicatorful vaginally at bedtime.   estrogens, conjugated, (PREMARIN) 0.625 MG tablet Take 1 tablet (0.625 mg total) by mouth daily.   gabapentin (NEURONTIN) 100 MG capsule Take 2-3 capsules (200-300 mg total) by mouth at bedtime. May also take 1 capsule (100 mg total) 3 (three) times daily as needed.   gabapentin (NEURONTIN) 300  MG capsule Take 1 capsule, orally, up to TID or as directed   Menatetrenone (VITAMIN K2) 100 MCG TABS Take 100 mcg by mouth daily.   montelukast (SINGULAIR) 10 MG tablet Take 1 tablet (10 mg total) by mouth at bedtime.   Multiple Vitamins-Minerals (MULTIVITAMIN GUMMIES ADULT PO) Take 1 tablet by mouth daily.   Vitamin D, Ergocalciferol, (DRISDOL) 1.25 MG (50000 UNIT) CAPS capsule TAKE ONE CAPSULE BY MOUTH EVERY 7 DAYS   No current facility-administered medications for this visit. (Other)      REVIEW OF SYSTEMS:    ALLERGIES Allergies  Allergen Reactions   Amoxicillin-Pot Clavulanate Nausea Only    Severe nausea. Couldn't keep medication down  Severe nausea. Couldn't keep medication down   Clarithromycin Hives   Nitrofurantoin Other (See Comments) and Rash    Unknown Palms and hand itching, turned bright red per pt   Sulfonamide Derivatives Other (See Comments)    Abdominal cramping, nausea   Azithromycin Other (See Comments)    Z-pack, doesn't do anything Z-pack, doesn't do anything Z-pack, doesn't do anything Z-pack, doesn't do anything    Nitrofurantoin Macrocrystal Swelling   Sulfa Antibiotics Nausea And Vomiting and Other (See Comments)    Unknown    PAST MEDICAL HISTORY Past Medical History:  Diagnosis Date   Allergy **Note De-Identified Aiken Obfuscation** Atrial flutter ([place])    typical appearing   Basal cell carcinoma     nose - 1985   GERD (gastroesophageal reflux disease)    H/O cardiac radiofrequency ablation 2015   H/O gastric bypass 2005   History of diabetes mellitus 11/13/2015   Hypertension    Hypokalemia    Osteoporosis 2000   Paroxysmal atrial fibrillation (HCC)    Personal history of colonic polyps - sessile serrated 11/23/2013   Past Surgical History:  Procedure Laterality Date   APPENDECTOMY     ATRIAL FLUTTER ABLATION N/A 09/05/2014   Procedure: ATRIAL FLUTTER ABLATION;  Surgeon: Thompson Grayer, MD;  Location: [facility] CATH LAB;  Service: Cardiovascular;  Laterality: N/A;   BREAST  EXCISIONAL BIOPSY Right 01/2016   CARPAL TUNNEL RELEASE Bilateral    FOOT SURGERY Bilateral 2012   shorten bones in both feet   GASTRIC BYPASS     removal of ovary Right 1987   SPINE SURGERY  2000/2012   anterior cervical fusion with plating   TOTAL ABDOMINAL HYSTERECTOMY  1999   WRIST SURGERY Right 2018   Dr. Apolonio Schneiders -ganglion excision    FAMILY HISTORY Family History  Problem Relation Age of Onset   Diabetes Mother    Heart disease Mother    Arthritis Mother    Hearing loss Mother    Hyperlipidemia Mother    Hypertension Mother    Kidney disease Mother    Learning disabilities Mother    Miscarriages / [place] Mother    Heart disease Father    Hypertension Father    Alcohol abuse Father    Hyperlipidemia Father    Emphysema Maternal Grandmother    Cancer Maternal Grandmother        throat and lung(smoker)   Esophageal cancer Maternal Grandmother    Heart disease Brother    Cancer Brother        lung   Colon cancer Maternal Grandfather    Cancer Maternal Grandfather        colon   Scleroderma Sister    Early death Sister        schlederma   Miscarriages / [place] Sister    Stroke Brother    Emphysema Sister    Diabetes Sister    Hyperlipidemia Sister    Hypertension Sister    Mental illness Sister    96 / [place] Sister    Stroke Sister    Lung cancer Brother        smoker   Diabetes Brother    Diabetes Sister    Pancreatic cancer Neg Hx    Rectal cancer Neg Hx    Stomach cancer Neg Hx     SOCIAL HISTORY Social History   Tobacco Use   Smoking status: Never   Smokeless tobacco: Never  Vaping Use   Vaping Use: Never used  Substance Use Topics   Alcohol use: No   Drug use: No         OPHTHALMIC EXAM:  Base Eye Exam     Visual Acuity (ETDRS)       Right Left   Dist [place] 20/25 +2 20/20         Pupils       Pupils Dark Light Shape React APD   Right PERRL 4 3 Round Brisk None   Left PERRL 4 3 Round Brisk None          Visual Fields (Counting fingers)       Left Right **Note De-Identified Hipp Obfuscation** Full Full         Extraocular Movement       Right Left    Full, Ortho Full, Ortho         Neuro/Psych     Oriented x3: Yes   Mood/Affect: Normal         Dilation     Both eyes: 1.0% Mydriacyl, 2.5% Phenylephrine @ 2:00 PM           Slit Lamp and Fundus Exam     External Exam       Right Left   External Normal Normal         Slit Lamp Exam       Right Left   Lids/Lashes Normal Normal   Conjunctiva/Sclera White and quiet White and quiet   Cornea Clear Clear   Anterior Chamber Deep and quiet Deep and quiet   Iris Round and reactive Round and reactive   Lens 2+ Nuclear sclerosis 2+ Nuclear sclerosis   Anterior Vitreous Normal Normal         Fundus Exam       Right Left   C/D Ratio 0.4 0.3   Vessels no DR no DR            IMAGING AND PROCEDURES  Imaging and Procedures for 05/13/21  OCT, Retina - OU - Both Eyes       Right Eye Quality was good. Scan locations included subfoveal. Central Foveal Thickness: 295. Progression has been stable. Findings include normal foveal contour.   Left Eye Quality was good. Scan locations included subfoveal. Central Foveal Thickness: 289. Progression has been stable. Findings include normal foveal contour.   Notes Incidental posterior vitreous detachment noted OU             ASSESSMENT/PLAN:  Nuclear sclerotic cataract of right eye The nature of cataract was discussed with the patient as well as the elective nature of surgery. The patient was reassured that surgery at a later date does not put the patient at risk for a worse outcome. It was emphasized that the need for surgery is dictated by the patient's quality of life as influenced by the cataract. Patient was instructed to maintain close follow up with their general eye care doctor.    Diabetes mellitus without complication ([place]) The patient has diabetes without any evidence of  retinopathy. The patient advised to maintain good blood glucose control, excellent blood pressure control, and favorable levels of cholesterol, low density lipoprotein, and high density lipoproteins. Follow up in 1 year was recommended. Explained that fluctuations in visual acuity , or "out of focus", may result from large variations of blood sugar control.  Nuclear sclerotic cataract of left eye OS and OD with early color changes consistent with age for [facility],  I described to the patient that the early color changes like wearing dark sunglasses  Posterior vitreous detachment of right eye Physiologic OD     ICD-10-CM   1. Posterior vitreous detachment of left eye  H43.812 OCT, Retina - OU - Both Eyes    2. Nuclear sclerotic cataract of right eye  H25.11     3. Diabetes mellitus without complication ([place])  P10.2     4. Nuclear sclerotic cataract of left eye  H25.12     5. Posterior vitreous detachment of right eye  H43.811       1.  No interval change pathology detected, no detectable diabetic retinopathy  2.  Mild to moderate cataracts are present **Note De-Identified Wiseman Obfuscation** in each eye  3.  Ophthalmic Meds Ordered this visit:  No orders of the defined types were placed in this encounter.      Return in about 1 year (around 05/13/2022) for DILATE OU, COLOR FP, OCT.  There are no Patient Instructions on file for this visit.   Explained the diagnoses, plan, and follow up with the patient and they expressed understanding.  Patient expressed understanding of the importance of proper follow up care.   Clent Demark Marcques Wrightsman M.D. Diseases & Surgery of the Retina and Vitreous Retina & Diabetic [place] 05/13/21     Abbreviations: M myopia (nearsighted); A astigmatism; H hyperopia (farsighted); P presbyopia; Mrx spectacle prescription;  CTL contact lenses; OD right eye; OS left eye; OU both eyes  XT exotropia; ET esotropia; PEK punctate epithelial keratitis; PEE punctate epithelial erosions; DES dry eye  syndrome; MGD meibomian gland dysfunction; ATs artificial tears; PFAT's preservative free artificial tears; [place] nuclear sclerotic cataract; PSC posterior subcapsular cataract; ERM epi-retinal membrane; PVD posterior vitreous detachment; RD retinal detachment; DM diabetes mellitus; DR diabetic retinopathy; NPDR non-proliferative diabetic retinopathy; PDR proliferative diabetic retinopathy; CSME clinically significant macular edema; DME diabetic macular edema; dbh dot blot hemorrhages; CWS cotton wool spot; POAG primary open angle glaucoma; C/D cup-to-disc ratio; HVF humphrey visual field; GVF goldmann visual field; OCT optical coherence tomography; IOP intraocular pressure; BRVO Branch retinal vein occlusion; CRVO central retinal vein occlusion; CRAO central retinal artery occlusion; BRAO branch retinal artery occlusion; RT retinal tear; SB scleral buckle; PPV pars plana vitrectomy; VH Vitreous hemorrhage; PRP panretinal laser photocoagulation; IVK intravitreal kenalog; VMT vitreomacular traction; MH Macular hole;  NVD neovascularization of the disc; NVE neovascularization elsewhere; AREDS age related eye disease study; ARMD age related macular degeneration; POAG primary open angle glaucoma; EBMD epithelial/anterior basement membrane dystrophy; ACIOL anterior chamber intraocular lens; IOL intraocular lens; PCIOL posterior chamber intraocular lens; Phaco/IOL phacoemulsification with intraocular lens placement; [place] photorefractive keratectomy; LASIK laser assisted in situ keratomileusis; HTN hypertension; DM diabetes mellitus; COPD chronic obstructive pulmonary disease

## 2021-05-13 NOTE — Assessment & Plan Note (Signed)
**Note De-identified Tsan Obfuscation** The nature of cataract was discussed with the patient as well as the elective nature of surgery. The patient was reassured that surgery at a later date does not put the patient at risk for a worse outcome. It was emphasized that the need for surgery is dictated by the patient's quality of life as influenced by the cataract. Patient was instructed to maintain close follow up with their general eye care doctor.   

## 2021-07-11 ENCOUNTER — Other Ambulatory Visit: Payer: Self-pay | Admitting: Family Medicine

## 2021-07-12 ENCOUNTER — Telehealth: Payer: Self-pay | Admitting: Family Medicine

## 2021-07-12 NOTE — Telephone Encounter (Signed)
**Note De-Identified Horacek Obfuscation** Pt wants to change Medication Renewal office visit on 01/31 to a MyChart Video vist due to her work schedule. Patient's medication refills have been denied. Pt has not seen Dr. Jerline Pain since last Summer 2022 which was also a televisit. Patient wants Dr. Jerline Pain to refill the medications during pt's My Chart Video Visit.     [facility].

## 2021-07-12 NOTE — Telephone Encounter (Signed)
**Note De-Identified Hartsell Obfuscation** [facility] states 2 faxes were sent yesterday. They were checking to see if they were received. One was for Vitamin D and one was for prior authorization for Premarin.

## 2021-07-13 NOTE — Telephone Encounter (Signed)
**Note De-identified Dibari Obfuscation** See note

## 2021-07-15 NOTE — Telephone Encounter (Signed)
**Note De-Identified Oltmann Obfuscation** Please change her visit to virtual for tomorrow

## 2021-07-15 NOTE — Telephone Encounter (Signed)
**Note De-Identified Reading Obfuscation** PA Key: [id number] - PA Case ID: [id number] - Rx #: [id number] Status Sent to Plan today Drug Premarin 0.625MG  tablets Waiting for determination

## 2021-07-15 NOTE — Telephone Encounter (Signed)
**Note De-Identified Boehme Obfuscation** Ok to change to virtual visit.  Felicia Schultz. Jerline Pain, MD 07/15/2021 7:50 AM

## 2021-07-16 ENCOUNTER — Other Ambulatory Visit: Payer: Self-pay

## 2021-07-16 ENCOUNTER — Telehealth (INDEPENDENT_AMBULATORY_CARE_PROVIDER_SITE_OTHER): Payer: BC Managed Care – PPO | Admitting: Family Medicine

## 2021-07-16 VITALS — Ht 64.0 in | Wt 178.0 lb

## 2021-07-16 DIAGNOSIS — E559 Vitamin D deficiency, unspecified: Secondary | ICD-10-CM | POA: Diagnosis not present

## 2021-07-16 DIAGNOSIS — N959 Unspecified menopausal and perimenopausal disorder: Secondary | ICD-10-CM

## 2021-07-16 DIAGNOSIS — R739 Hyperglycemia, unspecified: Secondary | ICD-10-CM | POA: Diagnosis not present

## 2021-07-16 DIAGNOSIS — J309 Allergic rhinitis, unspecified: Secondary | ICD-10-CM | POA: Diagnosis not present

## 2021-07-16 DIAGNOSIS — M47812 Spondylosis without myelopathy or radiculopathy, cervical region: Secondary | ICD-10-CM

## 2021-07-16 MED ORDER — ESTROGENS CONJUGATED 0.625 MG PO TABS
0.6250 mg | ORAL_TABLET | Freq: Every day | ORAL | 3 refills | Status: DC
Start: 2021-07-16 — End: 2021-08-14

## 2021-07-16 MED ORDER — VITAMIN D (ERGOCALCIFEROL) 1.25 MG (50000 UNIT) PO CAPS: ORAL_CAPSULE | ORAL | 3 refills | Status: DC

## 2021-07-16 MED ORDER — CITALOPRAM HYDROBROMIDE 40 MG PO TABS: 40.0000 mg | ORAL_TABLET | Freq: Every day | ORAL | 3 refills | Status: DC

## 2021-07-16 MED ORDER — CYCLOBENZAPRINE HCL 5 MG PO TABS: 5.0000 mg | ORAL_TABLET | Freq: Three times a day (TID) | ORAL | 3 refills | Status: DC | PRN

## 2021-07-16 MED ORDER — GABAPENTIN 300 MG PO CAPS: ORAL_CAPSULE | ORAL | 2 refills | Status: DC

## 2021-07-16 MED ORDER — AZELASTINE HCL 0.1 % NA SOLN: 1.0000 | Freq: Two times a day (BID) | NASAL | 11 refills | Status: DC

## 2021-07-16 MED ORDER — GABAPENTIN 100 MG PO CAPS: ORAL_CAPSULE | ORAL | 2 refills | Status: DC

## 2021-07-16 MED ORDER — MONTELUKAST SODIUM 10 MG PO TABS: 10.0000 mg | ORAL_TABLET | Freq: Every day | ORAL | 3 refills | Status: DC

## 2021-07-16 MED ORDER — ESTRADIOL 0.1 MG/GM VA CREA
1.0000 | TOPICAL_CREAM | Freq: Every day | VAGINAL | 11 refills | Status: DC
Start: 2021-07-16 — End: 2023-03-11

## 2021-07-16 MED ORDER — DICLOFENAC SODIUM 2 % EX SOLN: 1.0000 | Freq: Three times a day (TID) | CUTANEOUS | 5 refills | Status: DC | PRN

## 2021-07-16 NOTE — Assessment & Plan Note (Signed)
**Note De-Identified Cameron Obfuscation** Refill Flexeril and gabapentin.

## 2021-07-16 NOTE — Assessment & Plan Note (Signed)
**Note De-Identified Caison Obfuscation** Not controlled.  We will restart Singulair and Astelin.  If continues to be an issue will need referral to see allergist.

## 2021-07-16 NOTE — Assessment & Plan Note (Signed)
**Note De-Identified Klimaszewski Obfuscation** We will refill vitamin D 50,000 IUs once weekly.  She will need to come back in soon for CPE and we can recheck vitamin D at that point.

## 2021-07-16 NOTE — Progress Notes (Signed)
**Note De-identified Longbottom Obfuscation** ° ° **Note De-Identified Giacomo Obfuscation** Kathleen Tamm Tanimoto is a 71 y.o. female who presents today for a virtual office visit.  Assessment/Plan:  Chronic Problems Addressed Today: Vitamin D deficiency We will refill vitamin D 50,000 IUs once weekly.  She will need to come back in soon for CPE and we can recheck vitamin D at that point.  Allergic rhinitis Not controlled.  We will restart Singulair and Astelin.  If continues to be an issue will need referral to see allergist.  Hyperglycemia Recheck A1c when she comes back in for CPE.  Postmenopausal symptoms She is on Celexa 40 mg daily tolerating well.  She uses Premarin very sparingly as well as Estrace cream.  She recently switched to a new insurance and is not sure.  We will be covering longer. We will check with pharmacy and she can discuss with her insurance company.  May need to switch to oral Estrace or estradiol.  Arthritis of neck Refill Flexeril and gabapentin.     Subjective:  HPI:  See A/P for status of chronic conditions.  She needs refills on medications today.  She is doing well.       Objective/Observations  Physical Exam: Gen: NAD, resting comfortably Pulm: Normal work of breathing Neuro: Grossly normal, moves all extremities Psych: Normal affect and thought content  Virtual Visit [place] Video   I connected with Johnisha Louks Ausmus on 07/16/21 at  1:40 PM EST by a video enabled telemedicine application and verified that I am speaking with the correct person using two identifiers. The limitations of evaluation and management by telemedicine and the availability of in person appointments were discussed. The patient expressed understanding and agreed to proceed.   Patient location: Home Provider location: [place] participating in the virtual visit: Myself and Patient     Algis Greenhouse. Jerline Pain, MD 07/16/2021 2:02 PM

## 2021-07-16 NOTE — Assessment & Plan Note (Addendum)
**Note De-Identified Cordial Obfuscation** She is on Celexa 40 mg daily tolerating well.  She uses Premarin very sparingly as well as Estrace cream.  She recently switched to a new insurance and is not sure.  We will be covering longer. We will check with pharmacy and she can discuss with her insurance company.  May need to switch to oral Estrace or estradiol.

## 2021-07-16 NOTE — Assessment & Plan Note (Signed)
**Note De-Identified Carrier Obfuscation** Recheck A1c when she comes back in for CPE.

## 2021-07-17 NOTE — Telephone Encounter (Signed)
**Note De-Identified Mcmannis Obfuscation** Deniedon January 30 Your PA request has been denied.

## 2021-07-18 ENCOUNTER — Telehealth: Payer: Self-pay | Admitting: *Deleted

## 2021-07-18 NOTE — Telephone Encounter (Signed)
**Note De-Identified Biederman Obfuscation** Key: [id number] - PA Case ID: [id number] Status Sent to [facility] Pennsaid 2% solution Waiting for determination

## 2021-07-22 NOTE — Telephone Encounter (Signed)
**Note De-Identified Zumstein Obfuscation** Deniedon February 2 Your PA request has been denied.

## 2021-08-14 ENCOUNTER — Ambulatory Visit (INDEPENDENT_AMBULATORY_CARE_PROVIDER_SITE_OTHER): Payer: BC Managed Care – PPO | Admitting: Family Medicine

## 2021-08-14 ENCOUNTER — Other Ambulatory Visit: Payer: Self-pay

## 2021-08-14 VITALS — BP 138/90 | HR 100 | Temp 98.1°F | Ht 64.0 in | Wt 187.8 lb

## 2021-08-14 DIAGNOSIS — Z0001 Encounter for general adult medical examination with abnormal findings: Secondary | ICD-10-CM

## 2021-08-14 DIAGNOSIS — Z23 Encounter for immunization: Secondary | ICD-10-CM | POA: Diagnosis not present

## 2021-08-14 DIAGNOSIS — R739 Hyperglycemia, unspecified: Secondary | ICD-10-CM

## 2021-08-14 DIAGNOSIS — E559 Vitamin D deficiency, unspecified: Secondary | ICD-10-CM

## 2021-08-14 DIAGNOSIS — R251 Tremor, unspecified: Secondary | ICD-10-CM | POA: Diagnosis not present

## 2021-08-14 DIAGNOSIS — R928 Other abnormal and inconclusive findings on diagnostic imaging of breast: Secondary | ICD-10-CM

## 2021-08-14 DIAGNOSIS — E2839 Other primary ovarian failure: Secondary | ICD-10-CM

## 2021-08-14 DIAGNOSIS — Z13220 Encounter for screening for lipoid disorders: Secondary | ICD-10-CM | POA: Diagnosis not present

## 2021-08-14 DIAGNOSIS — Z6832 Body mass index (BMI) 32.0-32.9, adult: Secondary | ICD-10-CM

## 2021-08-14 DIAGNOSIS — K219 Gastro-esophageal reflux disease without esophagitis: Secondary | ICD-10-CM

## 2021-08-14 DIAGNOSIS — N959 Unspecified menopausal and perimenopausal disorder: Secondary | ICD-10-CM

## 2021-08-14 DIAGNOSIS — E669 Obesity, unspecified: Secondary | ICD-10-CM

## 2021-08-14 LAB — LIPID PANEL
Cholesterol: 163 mg/dL (ref 0–200)
HDL: 74.6 mg/dL (ref >39.00)
LDL Cholesterol: 75 mg/dL (ref 0–99)
NonHDL: 88.83
Total CHOL/HDL Ratio: 2
Triglycerides: 69 mg/dL (ref 0.0–149.0)
VLDL: 13.8 mg/dL (ref 0.0–40.0)

## 2021-08-14 LAB — COMPREHENSIVE METABOLIC PANEL
ALT: 22 U/L (ref 0–35)
AST: 23 U/L (ref 0–37)
Albumin: 4.2 g/dL (ref 3.5–5.2)
Alkaline Phosphatase: 100 U/L (ref 39–117)
BUN: 20 mg/dL (ref 6–23)
CO2: 27 mEq/L (ref 19–32)
Calcium: 9.5 mg/dL (ref 8.4–10.5)
Chloride: 106 mEq/L (ref 96–112)
Creatinine, Ser: 0.66 mg/dL (ref 0.40–1.20)
GFR: 90.17 mL/min (ref >60.00)
Glucose, Bld: 105 mg/dL — ABNORMAL HIGH (ref 70–99)
Potassium: 3.8 mEq/L (ref 3.5–5.1)
Sodium: 141 mEq/L (ref 135–145)
Total Bilirubin: 0.7 mg/dL (ref 0.2–1.2)
Total Protein: 6.6 g/dL (ref 6.0–8.3)

## 2021-08-14 LAB — CBC
HCT: 40.4 % (ref 36.0–46.0)
Hemoglobin: 13.5 g/dL (ref 12.0–15.0)
MCHC: 33.4 g/dL (ref 30.0–36.0)
MCV: 92.6 fl (ref 78.0–100.0)
Platelets: 183 10*3/uL (ref 150.0–400.0)
RBC: 4.36 Mil/uL (ref 3.87–5.11)
RDW: 13.7 % (ref 11.5–15.5)
WBC: 4.4 10*3/uL (ref 4.0–10.5)

## 2021-08-14 LAB — HEMOGLOBIN A1C: Hgb A1c MFr Bld: 6.1 % (ref 4.6–6.5)

## 2021-08-14 LAB — VITAMIN D 25 HYDROXY (VIT D DEFICIENCY, FRACTURES): VITD: 27.16 ng/mL — ABNORMAL LOW (ref 30.00–100.00)

## 2021-08-14 LAB — TSH: TSH: 1.64 u[IU]/mL (ref 0.35–5.50)

## 2021-08-14 MED ORDER — ESTRADIOL 0.5 MG PO TABS: 0.5000 mg | ORAL_TABLET | Freq: Every day | ORAL | 3 refills | Status: DC

## 2021-08-14 MED ORDER — PROPRANOLOL HCL ER 60 MG PO CP24: 60.0000 mg | ORAL_CAPSULE | Freq: Every day | ORAL | 3 refills | Status: DC

## 2021-08-14 NOTE — Assessment & Plan Note (Signed)
**Note De-identified Mayor Obfuscation** Check A1c. 

## 2021-08-14 NOTE — Assessment & Plan Note (Signed)
**Note De-Identified Lapierre Obfuscation** Check Vit D.  She is on 50,000 IUs weekly. ?

## 2021-08-14 NOTE — Patient Instructions (Signed)
**Note De-Identified Palomarez Obfuscation** It was very nice to see you today! ? ?We will check blood work today. ? ?Please try the propranolol for your tremor. ? ?We will try Estrace for your menopausal symptoms. ? ?We will order her mammogram and bone density scan. ? ?We will give your pneumonia vaccine today. ? ?I will see back in 1 year for your next physical.  Come back sooner if needed. ? ?Take care, ?Dr Jerline Pain ? ?PLEASE NOTE: ? ?If you had any lab tests please let us know if you have not heard back within a few days. You may see your results on mychart before we have a chance to review them but we will give you a call once they are reviewed by [place]. If we ordered any referrals today, please let us know if you have not heard from their office within the next week.  ? ?Please try these tips to maintain a healthy lifestyle: ? ?Eat at least 3 REAL meals and 1-2 snacks per day.  Aim for no more than 5 hours between eating.  If you eat breakfast, please do so within one hour of getting up.  ? ?Each meal should contain half fruits/vegetables, one quarter protein, and one quarter carbs (no bigger than a computer mouse) ? ?Cut down on sweet beverages. This includes juice, soda, and sweet tea.  ? ?Drink at least 1 glass of water with each meal and aim for at least 8 glasses per day ? ?Exercise at least 150 minutes every week.   ? ?Preventive Care 30 Years and Older, Female ?Preventive care refers to lifestyle choices and visits with your health care provider that can promote health and wellness. Preventive care visits are also called wellness exams. ?What can I expect for my preventive care visit? ?Counseling ?Your health care provider may ask you questions about your: ?Medical history, including: ?Past medical problems. ?Family medical history. ?Pregnancy and menstrual history. ?History of falls. ?Current health, including: ?Memory and ability to understand (cognition). ?Emotional well-being. ?Home life and relationship well-being. ?Sexual activity and sexual  health. ?Lifestyle, including: ?Alcohol, nicotine or tobacco, and drug use. ?Access to firearms. ?Diet, exercise, and sleep habits. ?Work and work Statistician. ?Sunscreen use. ?Safety issues such as seatbelt and bike helmet use. ?Physical exam ?Your health care provider will check your: ?Height and weight. These may be used to calculate your BMI (body mass index). BMI is a measurement that tells if you are at a healthy weight. ?Waist circumference. This measures the distance around your waistline. This measurement also tells if you are at a healthy weight and may help predict your risk of certain diseases, such as type 2 diabetes and high blood pressure. ?Heart rate and blood pressure. ?Body temperature. ?Skin for abnormal spots. ?What immunizations do I need? ?Vaccines are usually given at various ages, according to a schedule. Your health care provider will recommend vaccines for you based on your age, medical history, and lifestyle or other factors, such as travel or where you work. ?What tests do I need? ?Screening ?Your health care provider may recommend screening tests for certain conditions. This may include: ?Lipid and cholesterol levels. ?Hepatitis C test. ?Hepatitis B test. ?HIV (human immunodeficiency virus) test. ?STI (sexually transmitted infection) testing, if you are at risk. ?Lung cancer screening. ?Colorectal cancer screening. ?Diabetes screening. This is done by checking your blood sugar (glucose) after you have not eaten for a while (fasting). ?Mammogram. Talk with your health care provider about how often you should have regular mammograms. ?BRCA-related cancer **Note De-Identified Onder Obfuscation** screening. This may be done if you have a family history of breast, ovarian, tubal, or peritoneal cancers. ?Bone density scan. This is done to screen for osteoporosis. ?Talk with your health care provider about your test results, treatment options, and if necessary, the need for more tests. ?Follow these instructions at home: ?Eating and  drinking ? ?Eat a diet that includes fresh fruits and vegetables, whole grains, lean protein, and low-fat dairy products. Limit your intake of foods with high amounts of sugar, saturated fats, and salt. ?Take vitamin and mineral supplements as recommended by your health care provider. ?Do not drink alcohol if your health care provider tells you not to drink. ?If you drink alcohol: ?Limit how much you have to 0-1 drink a day. ?Know how much alcohol is in your drink. In the [place] one drink equals one 12 oz bottle of beer (355 mL), one 5 oz glass of wine (148 mL), or one 1? oz glass of hard liquor (44 mL). ?Lifestyle ?Brush your teeth every morning and night with fluoride toothpaste. Floss one time each day. ?Exercise for at least 30 minutes 5 or more days each week. ?Do not use any products that contain nicotine or tobacco. These products include cigarettes, chewing tobacco, and vaping devices, such as e-cigarettes. If you need help quitting, ask your health care provider. ?Do not use drugs. ?If you are sexually active, practice safe sex. Use a condom or other form of protection in order to prevent STIs. ?Take aspirin only as told by your health care provider. Make sure that you understand how much to take and what form to take. Work with your health care provider to find out whether it is safe and beneficial for you to take aspirin daily. ?Ask your health care provider if you need to take a cholesterol-lowering medicine (statin). ?Find healthy ways to manage stress, such as: ?Meditation, yoga, or listening to music. ?Journaling. ?Talking to a trusted person. ?Spending time with friends and family. ?Minimize exposure to UV radiation to reduce your risk of skin cancer. ?Safety ?Always wear your seat belt while driving or riding in a vehicle. ?Do not drive: ?If you have been drinking alcohol. Do not ride with someone who has been drinking. ?When you are tired or distracted. ?While texting. ?If you have been using any  mind-altering substances or drugs. ?Wear a helmet and other protective equipment during sports activities. ?If you have firearms in your house, make sure you follow all gun safety procedures. ?What's next? ?Visit your health care provider once a year for an annual wellness visit. ?Ask your health care provider how often you should have your eyes and teeth checked. ?Stay up to date on all vaccines. ?This information is not intended to replace advice given to you by your health care provider. Make sure you discuss any questions you have with your health care provider. ?Document Revised: 11/28/2020 Document Reviewed: 11/28/2020 ?Elsevier Patient Education ? [place]. ? ?

## 2021-08-14 NOTE — Progress Notes (Signed)
**Note De-Identified Rinks Obfuscation** Chief Complaint:  Felicia Schultz is a 71 y.o. female who presents today for her annual comprehensive physical exam.    Assessment/Plan:  Chronic Problems Addressed Today: Vitamin D deficiency Check Vit D.  She is on 50,000 IUs weekly.  Tremor No red flags.  Likely essential tremor.  She would like to start treatment.  We will start low-dose propanolol 60 mg daily.  She will follow-up in a few weeks [facility] MyChart.  Hyperglycemia Check A1c.   Postmenopausal symptoms On Celexa 40 mg daily and tolerating well.  Insurance is no longer paying for Premarin.  We will switch to Estrace 0.5 mg daily.  She uses very sparingly as needed for hot flashes.  She will follow-up in a few weeks App MyChart.  Body mass index is 32.24 kg/m. / Obese  BMI Metric Follow Up - 08/14/21 0856       BMI Metric Follow Up-Please document annually   BMI Metric Follow Up Education provided             Preventative Healthcare: [place] 20 given today.  We will order bone density and mammogram.  Check labs.  Patient Counseling(The following topics were reviewed and/or handout was given):  -Nutrition: Stressed importance of moderation in sodium/caffeine intake, saturated fat and cholesterol, caloric balance, sufficient intake of fresh fruits, vegetables, and fiber.  -Stressed the importance of regular exercise.   -Substance Abuse: Discussed cessation/primary prevention of tobacco, alcohol, or other drug use; driving or other dangerous activities under the influence; availability of treatment for abuse.   -Injury prevention: Discussed safety belts, safety helmets, smoke detector, smoking near bedding or upholstery.   -Sexuality: Discussed sexually transmitted diseases, partner selection, use of condoms, avoidance of unintended pregnancy and contraceptive alternatives.   -Dental health: Discussed importance of regular tooth brushing, flossing, and dental visits.  -Health maintenance and immunizations reviewed.  Please refer to Health maintenance section.  Return to care in 1 year for next preventative visit.     Subjective:  HPI:  See A/P for status of chronic conditions.  She has noticed an ongoing hand tremor for the last year or so.  Located in bilateral hands.  Seems to be worsening.  No obvious aggravating or alleviating factors.  No reported weakness or numbness.  Lifestyle Diet: None specific.  Exercise: Limited.   Depression screen PHQ 2/9 08/14/2021  Decreased Interest 0  Down, Depressed, Hopeless 0  PHQ - 2 Score 0    Health Maintenance Due  Topic Date Due   URINE MICROALBUMIN  05/21/2011   Pneumonia Vaccine 28+ Years old (1 - PCV) Never done   MAMMOGRAM  09/15/2020     ROS: Per HPI, otherwise a complete review of systems was negative.   PMH:  The following were reviewed and entered/updated in epic: Past Medical History:  Diagnosis Date   Allergy    Atrial flutter ([place])    typical appearing   Basal cell carcinoma     nose - 1985   GERD (gastroesophageal reflux disease)    H/O cardiac radiofrequency ablation 2015   H/O gastric bypass 2005   History of diabetes mellitus 11/13/2015   Hypertension    Hypokalemia    Osteoporosis 2000   Paroxysmal atrial fibrillation (HCC)    Personal history of colonic polyps - sessile serrated 11/23/2013   Patient Active Problem List   Diagnosis Date Noted   Tremor 08/14/2021   History of atrial fibrillation 08/03/2020   Bilateral dry eyes 03/28/2020   Vitreous **Note De-Identified Rempel Obfuscation** membranes and strands, right 03/28/2020   Nuclear sclerotic cataract of right eye 03/28/2020   Nuclear sclerotic cataract of left eye 03/28/2020   Posterior vitreous detachment of left eye 03/28/2020   Posterior vitreous detachment of right eye 03/28/2020   Hyperglycemia 02/18/2019   S/P gastric bypass 07/09/2018   Achilles tendinosis 04/30/2018   Spondylosis of lumbar joint 05/28/2017   Plantar fasciitis, bilateral 05/28/2017   Chronic pain of right wrist 05/28/2017    Haglund's deformity of right heel 02/02/2017   Postmenopausal symptoms 06/02/2016   Arthritis of neck 07/19/2014   GERD (gastroesophageal reflux disease) 07/19/2014   Vitamin D deficiency 07/19/2014   Personal history of colonic polyps - sessile serrated 11/23/2013   Allergic rhinitis 11/11/2011   Past Surgical History:  Procedure Laterality Date   APPENDECTOMY     ATRIAL FLUTTER ABLATION N/A 09/05/2014   Procedure: ATRIAL FLUTTER ABLATION;  Surgeon: Thompson Grayer, MD;  Location: [facility] CATH LAB;  Service: Cardiovascular;  Laterality: N/A;   BREAST EXCISIONAL BIOPSY Right 01/2016   CARPAL TUNNEL RELEASE Bilateral    FOOT SURGERY Bilateral 2012   shorten bones in both feet   GASTRIC BYPASS     removal of ovary Right 1987   SPINE SURGERY  2000/2012   anterior cervical fusion with plating   TOTAL ABDOMINAL HYSTERECTOMY  1999   WRIST SURGERY Right 2018   Dr. Apolonio Schneiders -ganglion excision    Family History  Problem Relation Age of Onset   Diabetes Mother    Heart disease Mother    Arthritis Mother    Hearing loss Mother    Hyperlipidemia Mother    Hypertension Mother    Kidney disease Mother    Learning disabilities Mother    Miscarriages / [place] Mother    Heart disease Father    Hypertension Father    Alcohol abuse Father    Hyperlipidemia Father    Emphysema Maternal Grandmother    Cancer Maternal Grandmother        throat and lung(smoker)   Esophageal cancer Maternal Grandmother    Heart disease Brother    Cancer Brother        lung   Colon cancer Maternal Grandfather    Cancer Maternal Grandfather        colon   Scleroderma Sister    Early death Sister        schlederma   Miscarriages / [place] Sister    Stroke Brother    Emphysema Sister    Diabetes Sister    Hyperlipidemia Sister    Hypertension Sister    Mental illness Sister    Miscarriages / [place] Sister    Stroke Sister    Lung cancer Brother        smoker   Diabetes Brother    Diabetes  Sister    Pancreatic cancer Neg Hx    Rectal cancer Neg Hx    Stomach cancer Neg Hx     Medications- reviewed and updated Current Outpatient Medications  Medication Sig Dispense Refill   azelastine (ASTELIN) 0.1 % nasal spray Place 1-2 sprays into both nostrils 2 (two) times daily. Use in each nostril as directed 30 mL 11   citalopram (CELEXA) 40 MG tablet Take 1 tablet (40 mg total) by mouth daily. 90 tablet 3   Cyanocobalamin (VITAMIN B 12 PO) Take 1 tablet by mouth daily.     cyclobenzaprine (FLEXERIL) 5 MG tablet Take 1 tablet (5 mg total) by mouth 3 (three) times daily as **Note De-Identified Gillison Obfuscation** needed for muscle spasms. 180 tablet 3   Diclofenac Sodium 2 % SOLN Apply 1 Pump topically 3 (three) times daily as needed. 112 g 5   estradiol (ESTRACE) 0.1 MG/GM vaginal cream Place 1 Applicatorful vaginally at bedtime. 42.5 g 11   estradiol (ESTRACE) 0.5 MG tablet Take 1 tablet (0.5 mg total) by mouth daily. 90 tablet 3   gabapentin (NEURONTIN) 100 MG capsule Take 100-300mg  nightly at bedtime. 120 capsule 2   gabapentin (NEURONTIN) 300 MG capsule Take 1 capsule, orally, up to TID or as directed 90 capsule 2   Menatetrenone (VITAMIN K2) 100 MCG TABS Take 100 mcg by mouth daily. 90 tablet 5   montelukast (SINGULAIR) 10 MG tablet Take 1 tablet (10 mg total) by mouth at bedtime. 90 tablet 3   Multiple Vitamins-Minerals (MULTIVITAMIN GUMMIES ADULT PO) Take 1 tablet by mouth daily.     propranolol ER (INDERAL LA) 60 MG 24 hr capsule Take 1 capsule (60 mg total) by mouth daily. 90 capsule 3   Vitamin D, Ergocalciferol, (DRISDOL) 1.25 MG (50000 UNIT) CAPS capsule Take 50000IU once weekly. 12 capsule 3   No current facility-administered medications for this visit.    Allergies-reviewed and updated Allergies  Allergen Reactions   Amoxicillin-Pot Clavulanate Nausea Only    Severe nausea. Couldn't keep medication down  Severe nausea. Couldn't keep medication down   Clarithromycin Hives   Nitrofurantoin Other (See  Comments) and Rash    Unknown Palms and hand itching, turned bright red per pt   Sulfonamide Derivatives Other (See Comments)    Abdominal cramping, nausea   Azithromycin Other (See Comments)    Z-pack, doesn't do anything Z-pack, doesn't do anything Z-pack, doesn't do anything Z-pack, doesn't do anything    Nitrofurantoin Macrocrystal Swelling   Sulfa Antibiotics Nausea And Vomiting and Other (See Comments)    Unknown    Social History   Socioeconomic History   Marital status: Married    Spouse name: Not on file   Number of children: 0   Years of education: Not on file   Highest education level: Not on file  Occupational History   Occupation: Actuary: [employer]  Tobacco Use   Smoking status: Never   Smokeless tobacco: Never  Vaping Use   Vaping Use: Never used  Substance and Sexual Activity   Alcohol use: No   Drug use: No   Sexual activity: Never    Birth control/protection: Abstinence  Other Topics Concern   Not on file  Social History Narrative   Married, no children   Engineer, drilling for [employer].   Lives in [place].   1-2 caffeine/day   Social Determinants of Health   Financial Resource Strain: Not on file  Food Insecurity: Not on file  Transportation Needs: Not on file  Physical Activity: Not on file  Stress: Not on file  Social Connections: Not on file        Objective:  Physical Exam: BP 138/90 (BP Location: Left Arm)    Pulse 100    Temp 98.1 F (36.7 C) (Temporal)    Ht 5\' 4"  (1.626 m)    Wt 187 lb 12.8 oz (85.2 kg)    SpO2 97%    BMI 32.24 kg/m   Body mass index is 32.24 kg/m. Wt Readings from Last 3 Encounters:  08/14/21 187 lb 12.8 oz (85.2 kg)  07/16/21 178 lb (80.7 kg)  08/03/20 178 lb (80.7 kg) **Note De-Identified Maugeri Obfuscation** Gen: NAD, resting comfortably HEENT: TMs normal bilaterally. OP clear. No thyromegaly noted.  CV: RRR with no murmurs appreciated Pulm: NWOB, CTAB with no crackles, wheezes, or  rhonchi GI: Normal bowel sounds present. Soft, Nontender, Nondistended. MSK: no edema, cyanosis, or clubbing noted Skin: warm, dry Neuro: CN2-12 grossly intact. Strength 5/5 in upper and lower extremities. Reflexes symmetric and intact bilaterally.  Psych: Normal affect and thought content     Kindall Swaby M. Jerline Pain, MD 08/14/2021 8:59 AM

## 2021-08-14 NOTE — Assessment & Plan Note (Signed)
**Note De-Identified Swalley Obfuscation** On Celexa 40 mg daily and tolerating well.  Insurance is no longer paying for Premarin.  We will switch to Estrace 0.5 mg daily.  She uses very sparingly as needed for hot flashes.  She will follow-up in a few weeks [facility] MyChart. ?

## 2021-08-14 NOTE — Assessment & Plan Note (Signed)
**Note De-Identified Cammarano Obfuscation** No red flags.  Likely essential tremor.  She would like to start treatment.  We will start low-dose propanolol 60 mg daily.  She will follow-up in a few weeks [facility] MyChart. ?

## 2021-08-16 ENCOUNTER — Other Ambulatory Visit: Payer: Self-pay | Admitting: *Deleted

## 2021-08-16 NOTE — Progress Notes (Signed)
**Note De-Identified Cuadrado Obfuscation** Please inform patient of the following: ? ?Vitamin D still a bit low but stable.  She can continue supplementation we can recheck again in 6 to 12 months. ? ?Her blood sugar is borderline but stable as well.  Do not need to start any new medications but she should continue working on diet and exercise and we can recheck in year.  The rest of her blood work is all normal.

## 2021-09-28 ENCOUNTER — Encounter: Payer: Self-pay | Admitting: Family Medicine

## 2021-09-30 NOTE — Telephone Encounter (Signed)
**Note De-identified Schoeppner Obfuscation** Please see note and advise  

## 2021-10-01 NOTE — Telephone Encounter (Signed)
**Note De-Identified Hakim Obfuscation** Vitamin D was just a little low.  This normally increases during the spring and summer months due to longer daylight.  It would be fine for her to increase her supplement though she will probably get to an acceptable range due to seasonal variation. ? ?Her blood sugars in the prediabetic range.  We can try Rybelsus but I am not sure if insurance will pay for it. Please send in Rybelsus 3 mg daily and have her follow-up with [place] in a month. ?

## 2021-10-03 ENCOUNTER — Other Ambulatory Visit: Payer: Self-pay | Admitting: *Deleted

## 2021-10-03 MED ORDER — RYBELSUS 3 MG PO TABS: 3.0000 mg | ORAL_TABLET | Freq: Every day | ORAL | 1 refills | Status: DC

## 2022-02-13 ENCOUNTER — Ambulatory Visit: Payer: BC Managed Care – PPO | Admitting: Family Medicine

## 2022-02-13 ENCOUNTER — Ambulatory Visit: Payer: Self-pay

## 2022-02-13 VITALS — BP 150/82 | HR 63 | Ht 64.0 in | Wt 191.8 lb

## 2022-02-13 DIAGNOSIS — M6788 Other specified disorders of synovium and tendon, other site: Secondary | ICD-10-CM | POA: Diagnosis not present

## 2022-02-13 MED ORDER — NITROGLYCERIN 0.2 MG/HR TD PT24: MEDICATED_PATCH | TRANSDERMAL | 1 refills | Status: DC

## 2022-02-13 NOTE — Patient Instructions (Addendum)
**Note De-Identified Wile Obfuscation** Thank you for coming in today.   Please complete the exercises that the athletic trainer went over with you:  View at [email or web address]-exercise-code.com using code: [id number]  Try wearing the heel lifts and night splints  Check back in 6 weeks  Nitroglycerin Protocol Apply 1/4 nitroglycerin patch to affected area daily. Change position of patch within the affected area every 24 hours. You may experience a headache during the first 1-2 weeks of using the patch, these should subside. If you experience headaches after beginning nitroglycerin patch treatment, you may take your preferred over the counter pain reliever. Another side effect of the nitroglycerin patch is skin irritation or rash related to patch adhesive. Please notify our office if you develop more severe headaches or rash, and stop the patch. Tendon healing with nitroglycerin patch may require 12 to 24 weeks depending on the extent of injury. Men should not use if taking Viagra, Cialis, or Levitra.  Do not use if you have migraines or rosacea.

## 2022-02-13 NOTE — Progress Notes (Signed)
**Note De-identified Mollett Obfuscation**   **Note De-Identified Brethauer Obfuscation** I, Felicia Schultz, LAT, ATC acting as a scribe for Felicia Leader, MD.  Subjective:    CC: R Achilles pain  HPI: Pt is a 71 y/o female c/o R Achilles pain x 4-5 week. Pt was previously seen by Dr. Tamala Julian in 2020 for bilat plantar fascitis. Pt locates pain to the posterior aspect of the R Achilles and into the distal tendon. Pt notes she will also get sharp pains shooting along the medial aspect of the R ankle.  Pt notes the pain will worsen throughout the day. Pt works in a Film/video editor. Pt notes she has had a similar pain a few years ago in to L foot/ankle.   R foot swelling: yes Aggravates: walking Treatments tried: Pennsaid, Voltaren gel,   Pertinent review of Systems: No fevers or chills  Relevant historical information: History of left planter fasciitis in 2020 ultimately benefiting from PRP injection She tolerated but did not benefit from nitroglycerin patch protocol for left planter fasciitis in 2020.  Objective:    Vitals:   02/13/22 1242  BP: (!) 150/82  Pulse: 63  SpO2: 97%   General: Well Developed, well nourished, and in no acute distress.   MSK: Right ankle: Posterior calcaneus mild swelling.  Tender palpation posterior calcaneus.   Tender to palpation mildly along the course of the posterior tibialis tendon medially. Normal foot and ankle motion. .  Some pain with resisted foot inversion and plantarflexion. Pulses capillary fill and sensation are intact distally.  Lab and Radiology Results  Diagnostic Limited MSK Ultrasound of: Right Achilles tendon and medial ankle Significant calcification seen within the distal Achilles tendon consistent with chronic calcific tendinopathy. Mild hypoechoic fluid tracks along posterior tibialis tendon consistent with mild or minimal posterior tibialis tenosynovitis. Impression: Significant chronic calcific Achilles tendinitis mild or minimal posterior tibialis tendinitis     Impression and Recommendations:     Assessment and Plan: 71 y.o. female with right heel pain due to significant chronic calcific Achilles tendinitis.  Plan for eccentric exercises taught in clinic today by ATC, nitroglycerin patch protocol, and night splints.  Also recommend heel lift.  Check back in 6 weeks.  If not improved consider shockwave therapy and ultimately potentially PRP.. Talked about safety and side effects of meds glycerin.  PDMP not reviewed this encounter. Orders Placed This Encounter  Procedures   [place] LIMITED JOINT SPACE STRUCTURES LOW RIGHT(NO LINKED CHARGES)    Order Specific Question:   Reason for Exam (SYMPTOM  OR DIAGNOSIS REQUIRED)    Answer:   right heel pain    Order Specific Question:   Preferred imaging location?    Answer:   [place]   Meds ordered this encounter  Medications   nitroGLYCERIN (NITRODUR - DOSED IN MG/24 HR) 0.2 mg/hr patch    Sig: Apply 1/4 patch daily to tendon for tendonitis.    Dispense:  30 patch    Refill:  1    Discussed warning signs or symptoms. Please see discharge instructions. Patient expresses understanding.   The above documentation has been reviewed and is accurate and complete Felicia Schultz, M.D.

## 2022-03-10 ENCOUNTER — Encounter: Payer: Self-pay | Admitting: *Deleted

## 2022-03-17 ENCOUNTER — Telehealth: Payer: Self-pay | Admitting: Family Medicine

## 2022-03-17 NOTE — Telephone Encounter (Addendum)
**Note De-Identified Petrak Obfuscation** Patient states she feels like an elephant is sitting on her chest-chest heaviness-sore/tightness in both shoulders-hurts to raise arms (especially right shoulder). Back pain ([place] down back).  Scheduled appointment with Dr. Jerline Pain for 03/18/22.  Transferred to Triage.

## 2022-03-17 NOTE — Telephone Encounter (Signed)
**Note De-Identified Fecher Obfuscation** Patient Name: Felicia Schultz Gender: Female DOB: March 13, 1951 Age: 71 Y 11 M 15 D Return Phone Number: [phone] (Primary) Address: City/ State/ Zip: [place] [address] Client [facility] Client Site [place] [facility] [place] Day Provider Dimas Chyle- MD Contact Type Call Who Is Calling Patient / Member / Family / Caregiver Call Type Triage / Clinical Relationship To Patient Self Return Phone Number [phone] (Primary) Chief Complaint CHEST PAIN - pain, pressure, heaviness or tightness Reason for Call Symptomatic / Request for [name] states she is having chest pressure , arm pain. Translation No Nurse Assessment Nurse: Jearld Pies, RN, Lovena Le Date/Time Eilene Ghazi Time): 03/17/2022 10:18:12 AM Confirm and document reason for call. If symptomatic, describe symptoms. ---Chest pressure and arm pain started 2 days ago. Current pain level 4/10. Denies fever, SOB, or any other symptoms at this time. Does the patient have any new or worsening symptoms? ---Yes Will a triage be completed? ---Yes Related visit to physician within the last 2 weeks? ---No Does the PT have any chronic conditions? (i.e. diabetes, asthma, this includes High risk factors for pregnancy, etc.) ---No Is this a behavioral health or substance abuse call? ---No Guidelines Guideline Title Affirmed Question Affirmed Notes Nurse Date/Time Eilene Ghazi Time) Chest Pain [1] Chest pain lasts > 5 minutes AND [2] age > [place], RN, Lovena Le 03/17/2022 10:19:10 AM Disp. Time Eilene Ghazi Time) Disposition Final User 03/17/2022 10:14:05 AM Send to Urgent Brett Fairy 03/17/2022 10:20:16 AM Call EMS 911 Now Yes Jearld Pies, RN, [employer]. Time Eilene Ghazi Time) Disposition Final User 03/17/2022 10:20:35 AM 911 Outcome Documentation Jearld Pies, RN, Lovena Le Reason: Refused Final Disposition 03/17/2022 10:20:16 AM Call EMS 911 Now Yes  Jearld Pies, RN, Apolonio Schneiders Disagree/Comply Comply Caller Understands Yes PreDisposition InappropriateToAsk Care Advice Given Per Guideline * Triager Discretion: I'll call you back in a few minutes to be sure you were able to reach them. * Immediate medical attention is needed. You need to hang up and call 911 (or an ambulance). CALL EMS 911 NOW: Comments User: Malissa Hippo, RN Date/Time Eilene Ghazi Time): 03/17/2022 10:22:27 AM Attempted to reach back line unsuccessfully to advise pt refused ED. Referrals GO TO FACILITY REFUSED

## 2022-03-17 NOTE — Telephone Encounter (Signed)
**Note De-Identified Chasse Obfuscation** LVM to both number provider in patient chart  Left message to return call to our office at their convenience.

## 2022-03-18 ENCOUNTER — Other Ambulatory Visit: Payer: Self-pay | Admitting: *Deleted

## 2022-03-18 ENCOUNTER — Ambulatory Visit (INDEPENDENT_AMBULATORY_CARE_PROVIDER_SITE_OTHER): Payer: BC Managed Care – PPO | Admitting: Family Medicine

## 2022-03-18 ENCOUNTER — Encounter: Payer: Self-pay | Admitting: Family Medicine

## 2022-03-18 ENCOUNTER — Ambulatory Visit (INDEPENDENT_AMBULATORY_CARE_PROVIDER_SITE_OTHER)
Admission: RE | Admit: 2022-03-18 | Discharge: 2022-03-18 | Disposition: A | Discharge status: Home / Self Care | Payer: BC Managed Care – PPO | Source: Ambulatory Visit | Attending: Family Medicine | Admitting: Family Medicine

## 2022-03-18 VITALS — BP 143/84 | HR 68 | Temp 97.7°F | Ht 64.0 in | Wt 191.6 lb

## 2022-03-18 DIAGNOSIS — R079 Chest pain, unspecified: Secondary | ICD-10-CM

## 2022-03-18 DIAGNOSIS — R739 Hyperglycemia, unspecified: Secondary | ICD-10-CM | POA: Diagnosis not present

## 2022-03-18 MED ORDER — PREDNISONE 50 MG PO TABS: ORAL_TABLET | ORAL | 0 refills | Status: DC

## 2022-03-18 MED ORDER — OZEMPIC (0.25 OR 0.5 MG/DOSE) 2 MG/3ML ~~LOC~~ SOPN: 0.5000 mg | PEN_INJECTOR | SUBCUTANEOUS | 0 refills | Status: DC

## 2022-03-18 NOTE — Assessment & Plan Note (Signed)
**Note De-Identified Arana Obfuscation** She is interested in trying injectable medication.  She is also trying to lose weight.  She is currently on Rybelsus 3 mg daily.  She has been tolerating well.  We discussed potential alternatives.  We will switch to Ozempic 0.5 mg weekly for 4 weeks and then increase the dose as tolerated.  Check A1C next office visit.

## 2022-03-18 NOTE — Progress Notes (Signed)
**Note De-identified Cullop Obfuscation**   **Note De-Identified Pfefferkorn Obfuscation** Felicia Schultz is a 71 y.o. female who presents today for an office visit.  Assessment/Plan:  New/Acute Problems: Back Pain Likely muscular strained based on exam.  Limited options due to history of gastric bypass.  We will start prednisone burst.  She can continue Flexeril and heating pad as well.  If not improving may consider referral to PT or sports medicine.  Chest Pain History is very atypical for cardiac etiology. Likely muscular as above.  Her EKG today showed normal sinus rhythm without any ischemic changes. Wells score 0 - doubt DVT.  We will be checking chest x-ray to further evaluate.  We will be treating with prednisone burst as above.  If chest tightness persist despite above will need referral to cardiology.  We discussed reasons to return to care and seek emergent care.  Chronic Problems Addressed Today: Hyperglycemia She is interested in trying injectable medication.  She is also trying to lose weight.  She is currently on Rybelsus 3 mg daily.  She has been tolerating well.  We discussed potential alternatives.  We will switch to Ozempic 0.5 mg weekly for 4 weeks and then increase the dose as tolerated.  Check A1C next office visit.     Subjective:  HPI:  Patient here with back pain for the last 4-5 days. Pain located in mid upper back between her shoulder blades. No obvious precipitating events. Pain occurred suddenly while at work. Tried taking gabapentin and hydrocodone. Tried heating pad with some improvement. OTc patches did not help. Aleve and tylenol did not help. Flexeril did not help.  She has noticed more tightness in her chest.  Worse with coughing and expiration.  This is also been persistent for the past 4 to 5 days.  She has not noticed an exertional symptoms. No fevers or chills.  No sputum production.  See A/p for status of chronic conditions.        Objective:  Physical Exam: BP (!) 143/84   Pulse 68   Temp 97.7 F (36.5 C) (Temporal)   Ht '5\' 4"'$   (1.626 m)   Wt 191 lb 9.6 oz (86.9 kg)   SpO2 96%   BMI 32.89 kg/m   Gen: No acute distress, resting comfortably CV: Regular rate and rhythm with no murmurs appreciated Pulm: Normal work of breathing, clear to auscultation bilaterally with no crackles, wheezes, or rhonchi Neuro: Grossly normal, moves all extremities Psych: Normal affect and thought content  EKG: NSR. No ischemic changes.   Time Spent: 40 minutes of total time was spent on the date of the encounter performing the following actions: chart review prior to seeing the patient, obtaining history, performing a medically necessary exam, counseling on the treatment plan including course of prednisone and xray, placing orders, and documenting in our EHR.        Algis Greenhouse. Jerline Pain, MD 03/18/2022 3:27 PM

## 2022-03-18 NOTE — Patient Instructions (Signed)
**Note De-Identified Abrigo Obfuscation** It was very nice to see you today!  We will give your flu shot today.  I think you probably have a muscular strain.  Please start the prednisone.  Please continue the Flexeril and heating pad.  We will get an x-ray to further evaluate.  I will send in [place].  Let me know if your pain is not improving over the next few days or if your symptoms worsen.  Take care, Dr Jerline Pain  PLEASE NOTE:  If you had any lab tests please let us know if you have not heard back within a few days. You may see your results on mychart before we have a chance to review them but we will give you a call once they are reviewed by [place]. If we ordered any referrals today, please let us know if you have not heard from their office within the next week.   Please try these tips to maintain a healthy lifestyle:  Eat at least 3 REAL meals and 1-2 snacks per day.  Aim for no more than 5 hours between eating.  If you eat breakfast, please do so within one hour of getting up.   Each meal should contain half fruits/vegetables, one quarter protein, and one quarter carbs (no bigger than a computer mouse)  Cut down on sweet beverages. This includes juice, soda, and sweet tea.   Drink at least 1 glass of water with each meal and aim for at least 8 glasses per day  Exercise at least 150 minutes every week.

## 2022-03-18 NOTE — Telephone Encounter (Signed)
**Note De-Identified Huntoon Obfuscation** Patient have OV today

## 2022-03-19 ENCOUNTER — Other Ambulatory Visit: Payer: Self-pay | Admitting: Family Medicine

## 2022-03-20 ENCOUNTER — Encounter: Payer: Self-pay | Admitting: Family Medicine

## 2022-03-21 NOTE — Telephone Encounter (Signed)
**Note De-Identified Fritzsche Obfuscation** This is a common finding for individuals her age.  This is not the best test to screen for coronary artery disease.  The better test would be to get a coronary artery calcium score.  We can order this if she would like or she can schedule an office visit to discuss she has questions.

## 2022-03-21 NOTE — Progress Notes (Signed)
**Note De-Identified Busk Obfuscation** Please inform patient of the following:  Xray is normal. Would like for her to let us know if her pain is not improving.  Algis Greenhouse. Jerline Pain, MD 03/21/2022 10:46 AM

## 2022-03-24 ENCOUNTER — Telehealth: Payer: Self-pay | Admitting: *Deleted

## 2022-03-24 NOTE — Telephone Encounter (Signed)
**Note De-identified Stamas Obfuscation** ( **Note De-Identified Kovarik Obfuscation** KeySol Blazing) Rx #: [id number] Rybelsus '3MG'$  tablets Waiting for determination

## 2022-03-27 ENCOUNTER — Ambulatory Visit: Payer: BC Managed Care – PPO | Admitting: Family Medicine

## 2022-03-27 NOTE — Telephone Encounter (Signed)
**Note De-Identified Majeed Obfuscation** PA Rx Rybelsus was denied   Left message to return call to our office at their convenience.

## 2022-05-13 ENCOUNTER — Encounter (INDEPENDENT_AMBULATORY_CARE_PROVIDER_SITE_OTHER): Payer: BC Managed Care – PPO | Admitting: Ophthalmology

## 2022-07-14 ENCOUNTER — Ambulatory Visit (INDEPENDENT_AMBULATORY_CARE_PROVIDER_SITE_OTHER)
Admission: RE | Admit: 2022-07-14 | Discharge: 2022-07-14 | Disposition: A | Discharge status: Home / Self Care | Payer: BC Managed Care – PPO | Source: Ambulatory Visit | Attending: Internal Medicine | Admitting: Internal Medicine

## 2022-07-14 ENCOUNTER — Ambulatory Visit: Payer: BC Managed Care – PPO | Admitting: Internal Medicine

## 2022-07-14 ENCOUNTER — Encounter: Payer: Self-pay | Admitting: Internal Medicine

## 2022-07-14 VITALS — BP 113/71 | HR 76 | Temp 98.1°F | Resp 16 | Ht 64.0 in | Wt 191.0 lb

## 2022-07-14 DIAGNOSIS — M545 Low back pain, unspecified: Secondary | ICD-10-CM | POA: Diagnosis not present

## 2022-07-14 MED ORDER — GABAPENTIN 300 MG PO CAPS: 300.0000 mg | ORAL_CAPSULE | Freq: Three times a day (TID) | ORAL | 0 refills | Status: DC

## 2022-07-14 MED ORDER — PREDNISONE 20 MG PO TABS: ORAL_TABLET | ORAL | 0 refills | Status: DC

## 2022-07-14 MED ORDER — CYCLOBENZAPRINE HCL 5 MG PO TABS: 5.0000 mg | ORAL_TABLET | Freq: Three times a day (TID) | ORAL | 3 refills | Status: DC | PRN

## 2022-07-14 MED ORDER — KETOROLAC TROMETHAMINE 10 MG PO TABS: 10.0000 mg | ORAL_TABLET | Freq: Four times a day (QID) | ORAL | 0 refills | Status: DC | PRN

## 2022-07-14 MED ORDER — TRAMADOL HCL 50 MG PO TABS: 50.0000 mg | ORAL_TABLET | Freq: Three times a day (TID) | ORAL | 0 refills | Status: AC | PRN

## 2022-07-14 MED ORDER — VITAMIN D (ERGOCALCIFEROL) 1.25 MG (50000 UNIT) PO CAPS: ORAL_CAPSULE | ORAL | 3 refills | Status: DC

## 2022-07-14 NOTE — Patient Instructions (Signed)
**Note De-Identified Laufer Obfuscation** It was a pleasure seeing you today!  Your health and satisfaction are my top priorities. If you believe your experience today was worthy of a 5-star rating, I'd be grateful for your feedback! Loralee Pacas, MD   CHECKOUT CHECKLIST  '[]'$    Schedule next appointment(s):    Return in about 1 week (around 07/21/2022) for close follow up acute illness consider fosamax..  Any requested lab visits should be scheduled as appointments too  If you are not doing well:  Return to the office sooner Please bring all your medicine bottles to each appointment If your condition begins to worsen or become severe:  go to the emergency room or even call 911  '[]'$    X-rays can be obtained at the  [facility] office. You can walk in M-F between 8:30am- noon or 1pm - 5pm. Tell them you are there for xrays ordered by me. They will send me the results, then I will let you know the results with instructions. Address: [address].  The Xray department is located in the basement.     '[]'$   (Optional):  Review your clinical notes on MyChart after they are completed.     Today's draft of the physician documented plan for today's visit: (final revisions will be visible on MyChart chart later) Low back pain without sciatica, unspecified back pain laterality, unspecified chronicity -     DG Lumbar Spine Complete W/Bend; Future -     Ketorolac Tromethamine; Take 1 tablet (10 mg total) by mouth every 6 (six) hours as needed.  Dispense: 20 tablet; Refill: 0 -     predniSONE; Take 2 pills for 3 days, 1 pill for 4 days  Dispense: 10 tablet; Refill: 0 -     Gabapentin; Take 1 capsule (300 mg total) by mouth 3 (three) times daily.  Dispense: 90 capsule; Refill: 0 -     Ambulatory referral to Pain Clinic -     traMADol HCl; Take 1 tablet (50 mg total) by mouth every 8 (eight) hours as needed for up to 5 days.  Dispense: 15 tablet; Refill: 0 -     Ambulatory referral to Physical Therapy -     Cyclobenzaprine HCl; Take 1  tablet (5 mg total) by mouth 3 (three) times daily as needed for muscle spasms.  Dispense: 180 tablet; Refill: 3 -     Vitamin D (Ergocalciferol); Take 50000IU once weekly.  Dispense: 12 capsule; Refill: 3    Suspect spinal compression fracture or disk injury with deterioration causing secondary left sided neuropathy at L1-L2.   QUESTIONS & CONCERNS: CLINICAL: please contact us [facility] phone [phone] OR MyChart messaging  LAB & IMAGING:   We will call you if the results are significantly abnormal or you don't use MyChart.  Most normal results will be posted to MyChart immediately and have a clinical review message by Dr. Randol Kern posted within 2-3 business days.   If you have not heard from [place] regarding the results in 2 weeks OR if you need priority reporting, please contact this office. MYCHART:  The fastest way to get your results and easiest way to stay in touch with [place] is by activating your My Chart account. Instructions are located on the last page of this paperwork.  BILLING: xray and lab orders are billed from separate companies and questions./concerns should be directed to the [place].  For visit charges please discuss with our administrative services COMPLAINTS:  please let Dr. **Note De-Identified Alarid Obfuscation** Randol Kern know or see the [facility], by asking at the front desk: we want you to be satisfied with every experience and we would be grateful for the opportunity to address any problems

## 2022-07-14 NOTE — Progress Notes (Signed)
**Note De-Identified Boehner Obfuscation** Felicia Schultz [place]: [phone]   Routine Medical Office Visit  Patient:  Felicia Schultz      Age: 72 y.o.       Sex:  female  Date:   07/14/2022  PCP:    Vivi Barrack, [place] Provider: Loralee Pacas, MD   Problem Focused Charting:   Medical Decision Making per Assessment/Plan   Felicia Schultz was seen today for back pain.  Low back pain without sciatica, unspecified back pain laterality, unspecified chronicity -     DG Lumbar Spine Complete W/Bend; Future -     Ketorolac Tromethamine; Take 1 tablet (10 mg total) by mouth every 6 (six) hours as needed.  Dispense: 20 tablet; Refill: 0 -     predniSONE; Take 2 pills for 3 days, 1 pill for 4 days  Dispense: 10 tablet; Refill: 0 -     Gabapentin; Take 1 capsule (300 mg total) by mouth 3 (three) times daily.  Dispense: 90 capsule; Refill: 0 -     Ambulatory referral to Pain Clinic -     traMADol HCl; Take 1 tablet (50 mg total) by mouth every 8 (eight) hours as needed for up to 5 days.  Dispense: 15 tablet; Refill: 0 -     Ambulatory referral to Physical Therapy -     Cyclobenzaprine HCl; Take 1 tablet (5 mg total) by mouth 3 (three) times daily as needed for muscle spasms.  Dispense: 180 tablet; Refill: 3 -     Vitamin D (Ergocalciferol); Take 50000IU once weekly.  Dispense: 12 capsule; Refill: 3    Suspect spinal compression fracture or disk injury with deterioration causing secondary left sided neuropathy at L1-L2.    Today's key discussion points and After Visit Summary (AVS) reminders. Problem-associated medical records were reviewed with her during the appointment Common side effects, risks, benefits, and alternatives for medications and treatment plan prescribed today were discussed A work excuse was provided (see AVS)    Subjective - Clinical Presentation:   Felicia Schultz is a 72 y.o. female  Patient Active Problem List   Diagnosis Date Noted   Tremor 08/14/2021   History of atrial fibrillation  08/03/2020   Bilateral dry eyes 03/28/2020   Vitreous membranes and strands, right 03/28/2020   Nuclear sclerotic cataract of right eye 03/28/2020   Nuclear sclerotic cataract of left eye 03/28/2020   Posterior vitreous detachment of left eye 03/28/2020   Posterior vitreous detachment of right eye 03/28/2020   Hyperglycemia 02/18/2019   S/P gastric bypass 07/09/2018   Achilles tendinosis 04/30/2018   Spondylosis of lumbar joint 05/28/2017   Plantar fasciitis, bilateral 05/28/2017   Chronic pain of right wrist 05/28/2017   Haglund's deformity of right heel 02/02/2017   Postmenopausal symptoms 06/02/2016   Arthritis of neck 07/19/2014   GERD (gastroesophageal reflux disease) 07/19/2014   Vitamin D deficiency 07/19/2014   Personal history of colonic polyps - sessile serrated 11/23/2013   Allergic rhinitis 11/11/2011   Past Medical History:  Diagnosis Date   Allergy    Atrial flutter ([place])    typical appearing   Basal cell carcinoma     nose - 1985   GERD (gastroesophageal reflux disease)    H/O cardiac radiofrequency ablation 2015   H/O gastric bypass 2005   History of diabetes mellitus 11/13/2015   Hypertension    Hypokalemia    Osteoporosis 2000   Paroxysmal atrial fibrillation ([facility])    Personal history of colonic polyps - **Note De-Identified Normoyle Obfuscation** sessile serrated 11/23/2013    Outpatient Medications Prior to Visit  Medication Sig   azelastine (ASTELIN) 0.1 % nasal spray Place 1-2 sprays into both nostrils 2 (two) times daily. Use in each nostril as directed   citalopram (CELEXA) 40 MG tablet Take 1 tablet (40 mg total) by mouth daily.   Cyanocobalamin (VITAMIN B 12 PO) Take 1 tablet by mouth daily.   Diclofenac Sodium 2 % SOLN Apply 1 Pump topically 3 (three) times daily as needed.   estradiol (ESTRACE) 0.1 MG/GM vaginal cream Place 1 Applicatorful vaginally at bedtime.   estradiol (ESTRACE) 0.5 MG tablet Take 1 tablet (0.5 mg total) by mouth daily.   Menatetrenone (VITAMIN K2) 100 MCG TABS Take  100 mcg by mouth daily.   montelukast (SINGULAIR) 10 MG tablet Take 1 tablet (10 mg total) by mouth at bedtime.   Multiple Vitamins-Minerals (MULTIVITAMIN GUMMIES ADULT PO) Take 1 tablet by mouth daily.   nitroGLYCERIN (NITRODUR - DOSED IN MG/24 HR) 0.2 mg/hr patch Apply 1/4 patch daily to tendon for tendonitis.   predniSONE (DELTASONE) 50 MG tablet Take 1 tablet daily for 5 days. Then take 1/2 tablet daily for 2 days.   propranolol ER (INDERAL LA) 60 MG 24 hr capsule Take 1 capsule (60 mg total) by mouth daily.   Semaglutide,0.25 or 0.'5MG'$ /DOS, (OZEMPIC, 0.25 OR 0.5 MG/DOSE,) 2 MG/3ML SOPN Inject 0.5 mg into the skin once a week.   [DISCONTINUED] cyclobenzaprine (FLEXERIL) 5 MG tablet Take 1 tablet (5 mg total) by mouth 3 (three) times daily as needed for muscle spasms.   [DISCONTINUED] gabapentin (NEURONTIN) 100 MG capsule Take 100-'300mg'$  nightly at bedtime.   [DISCONTINUED] gabapentin (NEURONTIN) 300 MG capsule Take 1 capsule, orally, up to TID or as directed   [DISCONTINUED] Vitamin D, Ergocalciferol, (DRISDOL) 1.25 MG (50000 UNIT) CAPS capsule Take 50000IU once weekly.   No facility-administered medications prior to visit.    Chief Complaint  Patient presents with   Back Pain    Lower for two days.    HPI  Since 3 D ago Fall 27d ago on tailbone but no back pain then The pain is bilateral in back but radiates to abdomen on left only No tenderness anywhere Chronic vitamin D deficiency Last DEXA 2007ish Gastric bypass 2005. History of stress fracture of her foot and already has gabapentin 100 but cannot even tolerate 300 with her employment.         Objective:  Physical Exam  BP 113/71   Pulse 76   Temp 98.1 F (36.7 C)   Resp 16   Ht '5\' 4"'$  (1.626 m)   Wt 191 lb (86.6 kg)   SpO2 95%   BMI 32.79 kg/m  Obese  by BMI criteria but truncal adiposity (waist circumference or caliper) should be used instead. Wt Readings from Last 10 Encounters:  07/14/22 191 lb (86.6 kg)   03/18/22 191 lb 9.6 oz (86.9 kg)  02/13/22 191 lb 12.8 oz (87 kg)  08/14/21 187 lb 12.8 oz (85.2 kg)  07/16/21 178 lb (80.7 kg)  08/03/20 178 lb (80.7 kg)  05/17/20 174 lb (78.9 kg)  05/07/20 175 lb (79.4 kg)  05/02/20 175 lb (79.4 kg)  03/09/19 183 lb (83 kg)   Vital signs reviewed.  Nursing notes reviewed. Weight trend reviewed. General Appearance:  Well developed, well nourished female in no acute distress.   Normal work of breathing at rest Musculoskeletal: All extremities are intact.  Neurological:  Awake, alert,  No obvious focal neurological **Note De-Identified Dolloff Obfuscation** deficits or cognitive impairments Psychiatric:  Appropriate mood, pleasant demeanor Problem-specific findings:  slow sit to stand with pain grimace with spinal extension, limited flexion at spine, flares the pain. There is no tenderness anywhwere (per patient report)      Signed: Loralee Pacas, MD 07/14/2022 11:04 AM

## 2022-07-15 NOTE — Progress Notes (Signed)
**Note De-Identified Sentell Obfuscation** No compression fracture so likely the degenerative disk disease causing neurolforaminal stenosis and back spasm/neuropathic pain.

## 2022-07-16 NOTE — Therapy (Unsigned)
**Note De-Identified Berendt Obfuscation** OUTPATIENT PHYSICAL THERAPY THORACOLUMBAR EVALUATION   Patient Name: Felicia Schultz MRN: [id number] DOB:12/17/1950, 72 y.o., female Today's Date: 07/17/2022  END OF SESSION:  PT End of Session - 07/17/22 1212     Visit Number 1    Number of Visits 12    Date for PT Re-Evaluation 08/28/22    Authorization Type BCBS VL 30    Authorization - Visit Number 1    Authorization - Number of Visits 30    Progress Note Due on Visit 10    PT Start Time 1215    PT Stop Time 1300    PT Time Calculation (min) 45 min             Past Medical History:  Diagnosis Date   Allergy    Atrial flutter ([place])    typical appearing   Basal cell carcinoma     nose - 1985   GERD (gastroesophageal reflux disease)    H/O cardiac radiofrequency ablation 2015   H/O gastric bypass 2005   History of diabetes mellitus 11/13/2015   Hypertension    Hypokalemia    Osteoporosis 2000   Paroxysmal atrial fibrillation ([facility])    Personal history of colonic polyps - sessile serrated 11/23/2013   Past Surgical History:  Procedure Laterality Date   APPENDECTOMY     ATRIAL FLUTTER ABLATION N/A 09/05/2014   Procedure: ATRIAL FLUTTER ABLATION;  Surgeon: Thompson Grayer, MD;  Location: [facility];  Service: Cardiovascular;  Laterality: N/A;   BREAST EXCISIONAL BIOPSY Right 01/2016   CARPAL TUNNEL RELEASE Bilateral    FOOT SURGERY Bilateral 2012   shorten bones in both feet   GASTRIC BYPASS     removal of ovary Right 1987   SPINE SURGERY  2000/2012   anterior cervical fusion with plating   TOTAL ABDOMINAL HYSTERECTOMY  1999   WRIST SURGERY Right 2018   Dr. Apolonio Schneiders -ganglion excision   Patient Active Problem List   Diagnosis Date Noted   Tremor 08/14/2021   History of atrial fibrillation 08/03/2020   Bilateral dry eyes 03/28/2020   Vitreous membranes and strands, right 03/28/2020   Nuclear sclerotic cataract of right eye 03/28/2020   Nuclear sclerotic cataract of left eye 03/28/2020   Posterior vitreous  detachment of left eye 03/28/2020   Posterior vitreous detachment of right eye 03/28/2020   Hyperglycemia 02/18/2019   S/P gastric bypass 07/09/2018   Achilles tendinosis 04/30/2018   Spondylosis of lumbar joint 05/28/2017   Plantar fasciitis, bilateral 05/28/2017   Chronic pain of right wrist 05/28/2017   Haglund's deformity of right heel 02/02/2017   Postmenopausal symptoms 06/02/2016   Arthritis of neck 07/19/2014   GERD (gastroesophageal reflux disease) 07/19/2014   Vitamin D deficiency 07/19/2014   Personal history of colonic polyps - sessile serrated 11/23/2013   Allergic rhinitis 11/11/2011    PCP: Vivi Barrack, MD  REFERRING PROVIDER:   Loralee Pacas, MD    REFERRING DIAG: M54.50 (ICD-10-CM) - Low back pain without sciatica, unspecified back pain laterality, unspecified chronicity  Rationale for Evaluation and Treatment: Rehabilitation  THERAPY DIAG:  Muscle weakness (generalized)  Other low back pain  ONSET DATE: about a month ago   SUBJECTIVE: **Note De-Identified Walston Obfuscation** SUBJECTIVE STATEMENT: States she tripped up some steps and landed on her tailbone. It hurt for a couple of days and then last week she woke up with her lower back hurting and had some sharp pains it not her left side. This lasted for 2 days and was painful both with standing and sitting.  States she is taking pain medicine which is helping.   Initially after she fell she could barely life her legs. States if she sits too long she gets fatigued all over   PERTINENT HISTORY:  Gastric bypass, osteopenia, ,DB, x2 cervical fusion C3-5 (pt reports)  PAIN:  Are you having pain? Yes: NPRS scale: 5/10 Pain location: all the way across, sometimes sharp pain radiates into  Pain description: sore Aggravating factors: sitting,  unsure Relieving factors: medication  PRECAUTIONS: None  WEIGHT BEARING RESTRICTIONS: No  FALLS:  Has patient fallen in last 6 months? Yes. 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 Alignment is normal. There is mild disc space narrowing at L3-L4, L4-L5 and L5-S1 with endplate osteophytes compatible with degenerative change. Alignment is stable with flexion and extension. There are mild degenerative changes of facet joints at L4-L5 and L5-S1. There surgical clips in the upper abdomen.   IMPRESSION: 1. No evidence of fracture or malalignment. 2. Mild degenerative changes in the lower lumbar spine.  PATIENT SURVEYS:  FOTO 16%  SCREENING FOR RED FLAGS: Bowel or bladder incontinence: No Spinal tumors: No Cauda equina syndrome: No Compression fracture: No Abdominal aneurysm: No  COGNITION: Overall cognitive status: Within functional limits for tasks assessed       POSTURE: hinges at L5, sacral sitter  PALPATION: tenderness to palpation along left lower ribs and obliques.  LUMBAR ROM:   AROM eval  Flexion 25% limited   Extension 50% limited *  Right lateral flexion 25% limited *  Left lateral flexion 25% limited *  Right rotation   Left rotation    (Blank rows = not tested) *pain on the left side of back   LE Measurements Lower Extremity Right 07/17/2022 Left 07/17/2022   A/PROM MMT A/PROM MMT  Hip Flexion 100* 4 100* 3+*  Hip Extension      Hip Abduction      Hip Adduction      Hip Internal rotation 25*  20*   Hip External rotation 45  60*   Knee Flexion  4  4  Knee Extension  4+  4+*  Ankle Dorsiflexion  4+  4  Ankle Plantarflexion      Ankle Inversion      Ankle Eversion       (Blank rows = not tested) * pain   LUMBAR SPECIAL TESTS:  Slump neg B, SLR neg  B Ely's test - + and pain in LBP with hip hike/lumbar ROT   TODAY'S TREATMENT: **Note De-Identified Reimann Obfuscation** DATE:   07/17/2022  Therapeutic Exercise:  Aerobic: Supine: pelvic tilt 5 minutes Prone: belly breathing 5 minutes  Seated:  Standing: Neuromuscular Re-education:on posture and sitting on sit bones not tail bones Manual Therapy: Therapeutic Activity: Self Care: Trigger Point Dry Needling:  Modalities:    PATIENT EDUCATION:  Education details: on current presentation, on HEP, on clinical outcomes score and POC Person educated: Patient Education method: Consulting civil engineer, Demonstration, and Handouts Education comprehension: verbalized understanding   HOME EXERCISE PROGRAM: [id number]  ASSESSMENT:  CLINICAL IMPRESSION: Patient complains of low back pain that radiates to front of abdomen after a fall on her coccyx about a month ago.  Patient with guarding during session reducing tolerance to interventions on this date.  Patient presents with muscle guarding, pain, strength and range of motion deficits that are likely contributing to current condition.  No imaging of coccyx has been done at this time and may be beneficial in the future if patient does not progress in function and reduction in symptoms.  Patient would greatly benefit from skilled physical therapy to improve overall quality of life and physical function.  OBJECTIVE IMPAIRMENTS: decreased activity tolerance, decreased balance, difficulty walking, decreased ROM, decreased strength, postural dysfunction, and pain.   ACTIVITY LIMITATIONS: lifting, sitting, standing, sleeping, and locomotion level  PARTICIPATION LIMITATIONS: community activity and occupation  PERSONAL FACTORS: Age and Fitness are also affecting patient's functional outcome.   REHAB POTENTIAL: Good  CLINICAL DECISION MAKING: Stable/uncomplicated  EVALUATION  COMPLEXITY: Low   GOALS: Goals reviewed with patient? yes  SHORT TERM GOALS: Target date: 08/07/2022  Patient will be independent in self management strategies to improve quality of life and functional outcomes. Baseline: New Program Goal status: INITIAL  2.  Patient will report at least 50% improvement in overall symptoms and/or function to demonstrate improved functional mobility Baseline: 0% better Goal status: INITIAL  3.  Patient will demonstrate pain-free lumbar range of motion to improve gross mobility Baseline: Painful Goal status: INITIAL  4.  Patient will demonstrate sitting on sit bones regularly to reduce stress on tailbone Baseline: Currently sits on tailbone during day Goal status: INITIAL    LONG TERM GOALS: Target date: 08/28/2022   Patient will report at least 75% improvement in overall symptoms and/or function to demonstrate improved functional mobility Baseline: 0% better Goal status: INITIAL  2.  Patient will improve score on FOTO outcomes measure to projected score to demonstrate overall improved function and QOL Baseline: see above Goal status: INITIAL  3.  Patient will demonstrate at least 4 out of 5 MMT in bilateral lower extremities to demonstrate improved strength Baseline: See above Goal status: INITIAL    PLAN:  PT FREQUENCY: 2x/week  PT DURATION: 6 weeks  PLANNED INTERVENTIONS: Therapeutic exercises, Therapeutic activity, Neuromuscular re-education, Balance training, Gait training, Patient/Family education, Self Care, Joint mobilization, Stair training, Vestibular training, DME instructions, Aquatic Therapy, Dry Needling, Electrical stimulation, Spinal manipulation, Spinal mobilization, Cryotherapy, Moist heat, Traction, Ultrasound, Ionotophoresis '4mg'$ /ml Dexamethasone, Manual therapy, and Re-evaluation.  PLAN FOR NEXT SESSION: breathing, pelvic tilts, self mobilization-> STM once tolerated, ROM lx/hip   2:24 PM, 07/17/22 Jerene Pitch,  DPT Physical Therapy with [facility]

## 2022-07-17 ENCOUNTER — Ambulatory Visit: Payer: BC Managed Care – PPO | Admitting: Physical Therapy

## 2022-07-17 ENCOUNTER — Encounter: Payer: Self-pay | Admitting: Physical Therapy

## 2022-07-17 DIAGNOSIS — M6281 Muscle weakness (generalized): Secondary | ICD-10-CM | POA: Diagnosis not present

## 2022-07-17 DIAGNOSIS — M5459 Other low back pain: Secondary | ICD-10-CM

## 2022-07-21 ENCOUNTER — Ambulatory Visit (INDEPENDENT_AMBULATORY_CARE_PROVIDER_SITE_OTHER): Payer: BC Managed Care – PPO | Admitting: Family Medicine

## 2022-07-21 ENCOUNTER — Encounter: Payer: Self-pay | Admitting: Family Medicine

## 2022-07-21 VITALS — BP 128/77 | HR 67 | Temp 96.6°F | Ht 64.0 in | Wt 191.4 lb

## 2022-07-21 DIAGNOSIS — M47816 Spondylosis without myelopathy or radiculopathy, lumbar region: Secondary | ICD-10-CM | POA: Diagnosis not present

## 2022-07-21 DIAGNOSIS — E559 Vitamin D deficiency, unspecified: Secondary | ICD-10-CM

## 2022-07-21 DIAGNOSIS — G4730 Sleep apnea, unspecified: Secondary | ICD-10-CM

## 2022-07-21 DIAGNOSIS — Z13220 Encounter for screening for lipoid disorders: Secondary | ICD-10-CM

## 2022-07-21 DIAGNOSIS — R5383 Other fatigue: Secondary | ICD-10-CM | POA: Diagnosis not present

## 2022-07-21 DIAGNOSIS — E2839 Other primary ovarian failure: Secondary | ICD-10-CM

## 2022-07-21 DIAGNOSIS — R739 Hyperglycemia, unspecified: Secondary | ICD-10-CM

## 2022-07-21 LAB — COMPREHENSIVE METABOLIC PANEL
ALT: 12 U/L (ref 0–35)
AST: 12 U/L (ref 0–37)
Albumin: 4.1 g/dL (ref 3.5–5.2)
Alkaline Phosphatase: 88 U/L (ref 39–117)
BUN: 30 mg/dL — ABNORMAL HIGH (ref 6–23)
CO2: 28 mEq/L (ref 19–32)
Calcium: 9.3 mg/dL (ref 8.4–10.5)
Chloride: 106 mEq/L (ref 96–112)
Creatinine, Ser: 0.75 mg/dL (ref 0.40–1.20)
GFR: 81.3 mL/min (ref >60.00)
Glucose, Bld: 114 mg/dL — ABNORMAL HIGH (ref 70–99)
Potassium: 3.6 mEq/L (ref 3.5–5.1)
Sodium: 143 mEq/L (ref 135–145)
Total Bilirubin: 0.6 mg/dL (ref 0.2–1.2)
Total Protein: 6.3 g/dL (ref 6.0–8.3)

## 2022-07-21 LAB — CBC
HCT: 36.5 % (ref 36.0–46.0)
Hemoglobin: 12.1 g/dL (ref 12.0–15.0)
MCHC: 33.1 g/dL (ref 30.0–36.0)
MCV: 91.4 fl (ref 78.0–100.0)
Platelets: 219 10*3/uL (ref 150.0–400.0)
RBC: 4 Mil/uL (ref 3.87–5.11)
RDW: 14.8 % (ref 11.5–15.5)
WBC: 4 10*3/uL (ref 4.0–10.5)

## 2022-07-21 LAB — VITAMIN B12: Vitamin B-12: 207 pg/mL — ABNORMAL LOW (ref 211–911)

## 2022-07-21 LAB — HEMOGLOBIN A1C: Hgb A1c MFr Bld: 6 % (ref 4.6–6.5)

## 2022-07-21 LAB — VITAMIN D 25 HYDROXY (VIT D DEFICIENCY, FRACTURES): VITD: 19.48 ng/mL — ABNORMAL LOW (ref 30.00–100.00)

## 2022-07-21 LAB — TSH: TSH: 3.4 u[IU]/mL (ref 0.35–5.50)

## 2022-07-21 NOTE — Patient Instructions (Signed)
**Note De-Identified Chick Obfuscation** It was very nice to see you today!  I am glad your back is feeling better.  Will continue current treatment plan.  Let us know if not improving.  We will check blood work and refer you for sleep study.  Will see back in a month for your physical.  Come back sooner if needed.  Take care, Dr Jerline Pain  PLEASE NOTE:  If you had any lab tests, please let us know if you have not heard back within a few days. You may see your results on mychart before we have a chance to review them but we will give you a call once they are reviewed by [place].   If we ordered any referrals today, please let us know if you have not heard from their office within the next week.   If you had any urgent prescriptions sent in today, please check with the pharmacy within an hour of our visit to make sure the prescription was transmitted appropriately.   Please try these tips to maintain a healthy lifestyle:  Eat at least 3 REAL meals and 1-2 snacks per day.  Aim for no more than 5 hours between eating.  If you eat breakfast, please do so within one hour of getting up.   Each meal should contain half fruits/vegetables, one quarter protein, and one quarter carbs (no bigger than a computer mouse)  Cut down on sweet beverages. This includes juice, soda, and sweet tea.   Drink at least 1 glass of water with each meal and aim for at least 8 glasses per day  Exercise at least 150 minutes every week.

## 2022-07-21 NOTE — Progress Notes (Signed)
**Note De-identified Agram[place] Obfuscation**   **Note De-Identified Schreiner Obfuscation** Felicia Schultz is a 72 y.o. female who presents today for an office visit.  Assessment/Plan:  New/Acute Problems: Other Fatigue / Sleep disordered Breathing Check labs. Concern for possible OSA. Will place referral for sleep study  Back Pain No red flags.  Recent x-ray showed degenerative changes.  She has had some improvement with physical therapy and medications that were prescribed by different provider including Flexeril and gabapentin.  She is almost done with her prednisone.  She will continue current treatment plan and let us know if not improving.  Chronic Problems Addressed Today: Vitamin D deficiency Check vitamin D.   Spondylosis of lumbar joint Likely main contributor to her above back pain.  Will continue treatment plan as above and she will let me know if not improving.  Hyperglycemia Check A1c.  Preventative Healthcare Check labs. Will order DEXA.     Subjective:  HPI:  See A/p for status of chronic conditions.    Patient here today for follow-up.  Saw different provider here last week with back pain.  Got an x-ray that showed mild degenerative changes.  She was started on prednisone, gabapentin, tramadol, and cyclobenzaprine.  Referred for physical therapy.  Over the last few days the pain has been persistent but does seem to be improving in regards to the muscle spasms. She has seen physical therapist once last week and she was told she has some tightness in her muscles. She was also told that her she is using her low back muscles with her gait more than her hips which is contributing to her back pain.      Her main concern today is fatigue. She feels tired throughout the day. This has been going on for months to years.  She does not like she sleeps soundly at night.  Sleeps about 8 to 9 hours.  She does feel like sleep is refreshing however starts to get tired a few hours after waking up.  Occasionally dozes off.  She does notice that she snores.  No witnessed  apneic episodes.       Objective:  Physical Exam: BP 128/77   Pulse 67   Temp (!) 96.6 F (35.9 C) (Temporal)   Ht '5\' 4"'$  (1.626 m)   Wt 191 lb 6.4 oz (86.8 kg)   SpO2 97%   BMI 32.85 kg/m   Gen: No acute distress, resting comfortably CV: Regular rate and rhythm with no murmurs appreciated Pulm: Normal work of breathing, clear to auscultation bilaterally with no crackles, wheezes, or rhonchi MSK: - Back: No deformities.  Tenderness palpation along lower lumbar paraspinal muscles. Neuro: Grossly normal, moves all extremities Psych: Normal affect and thought content      Felicia Schultz M. Jerline Pain, MD 07/21/2022 8:52 AM

## 2022-07-21 NOTE — Assessment & Plan Note (Signed)
**Note De-identified Walls Obfuscation** Check A1c. 

## 2022-07-21 NOTE — Assessment & Plan Note (Signed)
**Note De-identified Langham Obfuscation** Check vitamin D. 

## 2022-07-21 NOTE — Assessment & Plan Note (Signed)
**Note De-Identified Luft Obfuscation** Likely main contributor to her above back pain.  Will continue treatment plan as above and she will let me know if not improving.

## 2022-07-22 ENCOUNTER — Other Ambulatory Visit: Payer: Self-pay

## 2022-07-22 NOTE — Therapy (Unsigned)
**Note De-Identified Fader Obfuscation** OUTPATIENT PHYSICAL THERAPY TREATMENT NOTE   Patient Name: Felicia Schultz MRN: [id number] DOB:[birth year], 72 y.o., female Today's Date: 07/23/2022   PCP: Vivi Barrack, MD   REFERRING PROVIDER:   Loralee Pacas, MD    END OF SESSION:   PT End of Session - 07/23/22 1433     Visit Number 2    Number of Visits 12    Date for PT Re-Evaluation 08/28/22    Authorization Type [employer] VL 30    Authorization - Visit Number 2    Authorization - Number of Visits 30    Progress Note Due on Visit 10    PT Start Time 1914    PT Stop Time 7829    PT Time Calculation (min) 41 min             Past Medical History:  Diagnosis Date   Allergy    Atrial flutter ([employer])    typical appearing   Basal cell carcinoma     nose - 1985   GERD (gastroesophageal reflux disease)    H/O cardiac radiofrequency ablation 2015   H/O gastric bypass 2005   History of diabetes mellitus 11/13/2015   Hypertension    Hypokalemia    Osteoporosis 2000   Paroxysmal atrial fibrillation ([place])    Personal history of colonic polyps - sessile serrated 11/23/2013   Past Surgical History:  Procedure Laterality Date   APPENDECTOMY     ATRIAL FLUTTER ABLATION N/A 09/05/2014   Procedure: ATRIAL FLUTTER ABLATION;  Surgeon: Thompson Grayer, MD;  Location: [facility];  Service: Cardiovascular;  Laterality: N/A;   BREAST EXCISIONAL BIOPSY Right 01/2016   CARPAL TUNNEL RELEASE Bilateral    FOOT SURGERY Bilateral 2012   shorten bones in both feet   GASTRIC BYPASS     removal of ovary Right 1987   SPINE SURGERY  2000/2012   anterior cervical fusion with plating   TOTAL ABDOMINAL HYSTERECTOMY  1999   WRIST SURGERY Right 2018   Dr. Apolonio Schneiders -ganglion excision   Patient Active Problem List   Diagnosis Date Noted   Tremor 08/14/2021   History of atrial fibrillation 08/03/2020   Bilateral dry eyes 03/28/2020   Vitreous membranes and strands, right 03/28/2020   Nuclear sclerotic cataract of right eye 03/28/2020    Nuclear sclerotic cataract of left eye 03/28/2020   Posterior vitreous detachment of left eye 03/28/2020   Posterior vitreous detachment of right eye 03/28/2020   Hyperglycemia 02/18/2019   S/P gastric bypass 07/09/2018   Achilles tendinosis 04/30/2018   Spondylosis of lumbar joint 05/28/2017   Plantar fasciitis, bilateral 05/28/2017   Chronic pain of right wrist 05/28/2017   Haglund's deformity of right heel 02/02/2017   Postmenopausal symptoms 06/02/2016   Arthritis of neck 07/19/2014   GERD (gastroesophageal reflux disease) 07/19/2014   Vitamin D deficiency 07/19/2014   Personal history of colonic polyps - sessile serrated 11/23/2013   Allergic rhinitis 11/11/2011     THERAPY DIAG:  No diagnosis found.     REFERRING DIAG: M54.50 (ICD-10-CM) - Low back pain without sciatica, unspecified back pain laterality, unspecified chronicity   Rationale for Evaluation and Treatment: Rehabilitation     ONSET DATE: about a month ago    SUBJECTIVE: **Note De-Identified Page Obfuscation** SUBJECTIVE STATEMENT: 07/23/2022 States that she feels a little better in her back. States that her whole body feels like it is twitching on the inside over the last couple of days. States she hasn't taken any medication over the last couple days because she didn't know if it caused it but it hasn't changed it.   Eval: States she tripped up some steps and landed on her tailbone. It hurt for a couple of days and then last week she woke up with her lower back hurting and had some sharp pains it not her left side. This lasted for 2 days and was painful both with standing and sitting.  States she is taking pain medicine which is helping.    Initially after she fell she could barely life her legs. States if she sits too long she gets fatigued all over     PERTINENT  HISTORY:  Gastric bypass, osteopenia, ,DB, x2 cervical fusion C3-5 (pt reports)   PAIN:  Are you having pain? Yes: NPRS scale: 5/10 Pain location: all the way across, sometimes sharp pain radiates into  Pain description: sore Aggravating factors: sitting, unsure Relieving factors: medication   PRECAUTIONS: None   WEIGHT BEARING RESTRICTIONS: No   FALLS:  Has patient fallen in last 6 months? Yes. 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 Alignment is normal. There is mild disc space narrowing at L3-L4, L4-L5 and L5-S1 with endplate osteophytes compatible with degenerative change. Alignment is stable with flexion and extension. There are mild degenerative changes of facet joints at L4-L5 and L5-S1. There surgical clips in the upper abdomen.   IMPRESSION: 1. No evidence of fracture or malalignment. 2. Mild degenerative changes in the lower lumbar spine.   PATIENT SURVEYS:  FOTO 16%   SCREENING FOR RED FLAGS: Bowel or bladder incontinence: No Spinal tumors: No Cauda equina syndrome: No Compression fracture: No Abdominal aneurysm: No   COGNITION: Overall cognitive status: Within functional limits for tasks assessed                              POSTURE: hinges at L5, sacral sitter   PALPATION: tenderness to palpation along left lower ribs and obliques.   LUMBAR ROM:    AROM eval  Flexion 25% limited   Extension 50% limited *  Right lateral flexion 25% limited *  Left lateral flexion 25% limited *  Right rotation    Left rotation     (Blank rows = not tested) *pain on the left side of back     LE Measurements       Lower Extremity Right 07/17/2022 Left 07/17/2022    A/PROM MMT A/PROM MMT  Hip Flexion 100* 4 100* 3+*  Hip Extension          Hip Abduction           Hip Adduction          Hip Internal rotation 25*   20*    Hip External rotation 45   60*    Knee Flexion   4   4  Knee Extension   4+   4+*  Ankle Dorsiflexion   4+   4 **Note De-Identified Kesling Obfuscation** Ankle Plantarflexion          Ankle Inversion          Ankle Eversion           (Blank rows = not tested) * pain     LUMBAR SPECIAL TESTS:  Slump neg B, SLR neg B Ely's test - + and pain in LBP with hip hike/lumbar ROT     TODAY'S TREATMENT:                                                                                                                              DATE:   07/23/2022 Therapeutic Exercise:    Aerobic: Supine:      Seated:    Standing: Neuromuscular Re-education:  Manual Therapy:STM to thoracic/lumbar paraspinals with percussion gun 15 minutes  Therapeutic Activity: Self Care: Trigger Point Dry Needling:  Modalities:      PATIENT EDUCATION:  Education details: on general nutrition advice in regards to recent blood work findings, on foods that support/improve vit D/B12 levels, on increasing time in sun to help with vit D absorption, on how to safely use percussion gun. Person educated: Patient Education method: Explanation, Demonstration, and Handouts Education comprehension: verbalized understanding     HOME EXERCISE PROGRAM: [id number]   ASSESSMENT:   CLINICAL IMPRESSION: 07/23/2022 Patient with reports of fatigue and tingling all over.  Reports she saw her recent MyChart results and interested to know what Dr. Marigene Ehlers neck step will be to help address vitamin B12 levels.  Answered all general questions about foods that help promote absorption of B12 and vitamin D.  Discussed following up with MD about any questions she has about recent blood work.  Positive response to soft tissue mobilization with percussion gun, instructed patient on how to safely utilize.  Reduced pain noted end of session we will continue with current plan of care as tolerated. Eval:Patient complains of low back  pain that radiates to front of abdomen after a fall on her coccyx about a month ago.  Patient with guarding during session reducing tolerance to interventions on this date.  Patient presents with muscle guarding, pain, strength and range of motion deficits that are likely contributing to current condition.  No imaging of coccyx has been done at this time and may be beneficial in the future if patient does not progress in function and reduction in symptoms.  Patient would greatly benefit from skilled physical therapy to improve overall quality of life and physical function.   OBJECTIVE IMPAIRMENTS: decreased activity tolerance, decreased balance, difficulty walking, decreased ROM, decreased strength, postural dysfunction, and pain.    ACTIVITY LIMITATIONS: lifting, sitting, standing, sleeping, and locomotion level   PARTICIPATION LIMITATIONS: community activity and occupation   PERSONAL FACTORS: Age and Fitness are also affecting patient's functional outcome.    REHAB POTENTIAL: Good   CLINICAL DECISION MAKING: Stable/uncomplicated   EVALUATION COMPLEXITY: Low     GOALS: Goals reviewed with patient? yes **Note De-Identified Bergerson Obfuscation** SHORT TERM GOALS: Target date: 08/07/2022  Patient will be independent in self management strategies to improve quality of life and functional outcomes. Baseline: New Program Goal status: INITIAL   2.  Patient will report at least 50% improvement in overall symptoms and/or function to demonstrate improved functional mobility Baseline: 0% better Goal status: INITIAL   3.  Patient will demonstrate pain-free lumbar range of motion to improve gross mobility Baseline: Painful Goal status: INITIAL   4.  Patient will demonstrate sitting on sit bones regularly to reduce stress on tailbone Baseline: Currently sits on tailbone during day Goal status: INITIAL       LONG TERM GOALS: Target date: 08/28/2022    Patient will report at least 75% improvement in overall symptoms and/or function  to demonstrate improved functional mobility Baseline: 0% better Goal status: INITIAL   2.  Patient will improve score on FOTO outcomes measure to projected score to demonstrate overall improved function and QOL Baseline: see above Goal status: INITIAL   3.  Patient will demonstrate at least 4 out of 5 MMT in bilateral lower extremities to demonstrate improved strength Baseline: See above Goal status: INITIAL       PLAN:   PT FREQUENCY: 2x/week   PT DURATION: 6 weeks   PLANNED INTERVENTIONS: Therapeutic exercises, Therapeutic activity, Neuromuscular re-education, Balance training, Gait training, Patient/Family education, Self Care, Joint mobilization, Stair training, Vestibular training, DME instructions, Aquatic Therapy, Dry Needling, Electrical stimulation, Spinal manipulation, Spinal mobilization, Cryotherapy, Moist heat, Traction, Ultrasound, Ionotophoresis '4mg'$ /ml Dexamethasone, Manual therapy, and Re-evaluation.   PLAN FOR NEXT SESSION: breathing, pelvic tilts, self mobilization-> STM once tolerated, ROM lx/hip     3:20 PM, 07/23/22 Jerene Pitch, DPT Physical Therapy with [facility]

## 2022-07-22 NOTE — Progress Notes (Signed)
**Note De-Identified Xiao Obfuscation** Please inform patient of the following:  B12 and vitamin D are both low.  Both of these could be contributing to her symptoms.  Recommend she start vitamin D supplementation 50,000 IUs once weekly.  Please send in prescription.  We should check this again in 3 to 6 months.  Recommend she start B12 protocol here with injections.  We can also recheck this again in 3 to 6 months.  Her blood sugar is borderline but stable compared to previous.  Her BUN is up slightly which indicates that she is probably dehydrated.  Recommend that she is getting plenty of fluids.  All of her other labs are stable.  Still recommend that she get a sleep study done as we discussed at her office visit.

## 2022-07-23 ENCOUNTER — Ambulatory Visit: Payer: BC Managed Care – PPO | Admitting: Physical Therapy

## 2022-07-23 DIAGNOSIS — M5459 Other low back pain: Secondary | ICD-10-CM

## 2022-07-23 DIAGNOSIS — M6281 Muscle weakness (generalized): Secondary | ICD-10-CM

## 2022-07-24 ENCOUNTER — Ambulatory Visit (INDEPENDENT_AMBULATORY_CARE_PROVIDER_SITE_OTHER): Payer: BC Managed Care – PPO | Admitting: *Deleted

## 2022-07-24 ENCOUNTER — Telehealth: Payer: Self-pay | Admitting: Family Medicine

## 2022-07-24 ENCOUNTER — Encounter: Payer: BC Managed Care – PPO | Admitting: Physical Therapy

## 2022-07-24 DIAGNOSIS — E538 Deficiency of other specified B group vitamins: Secondary | ICD-10-CM | POA: Diagnosis not present

## 2022-07-24 MED ORDER — CYANOCOBALAMIN 1000 MCG/ML IJ SOLN
1000.0000 ug | Freq: Once | INTRAMUSCULAR | Status: AC
  Administered 2022-07-24: 1000 ug via INTRAMUSCULAR

## 2022-07-24 NOTE — Progress Notes (Signed)
**Note De-Identified Limpert Obfuscation** Patient presents for B12 injection today. Patient received her B12 injection in Right Deltoid. Patient tolerated injection well.  Documentation entered in [facility] in [place].   Patient tolerated well

## 2022-07-24 NOTE — Telephone Encounter (Signed)
**Note De-Identified Santosuosso Obfuscation** Patient returned call regarding Lab Results

## 2022-07-25 NOTE — Telephone Encounter (Signed)
**Note De-identified Mannina Obfuscation** See results note. 

## 2022-07-28 ENCOUNTER — Encounter: Payer: Self-pay | Admitting: Physical Therapy

## 2022-07-28 ENCOUNTER — Ambulatory Visit: Payer: BC Managed Care – PPO | Admitting: Physical Therapy

## 2022-07-28 DIAGNOSIS — M6281 Muscle weakness (generalized): Secondary | ICD-10-CM

## 2022-07-28 DIAGNOSIS — M5459 Other low back pain: Secondary | ICD-10-CM

## 2022-07-28 NOTE — Therapy (Signed)
**Note De-Identified [place] [facility]** OUTPATIENT PHYSICAL THERAPY TREATMENT NOTE   Patient Name: Felicia Schultz MRN: [id number] DOB:1950-06-21, 72 y.o., female Today's Date: 07/28/2022   PCP: Vivi Barrack, MD   REFERRING PROVIDER:   Loralee Pacas, MD    END OF SESSION:   PT End of Session - 07/28/22 1345     Visit Number 3    Number of Visits 12    Date for PT Re-Evaluation 08/28/22    Authorization Type BCBS VL 30    Authorization - Visit Number 3    Authorization - Number of Visits 30    Progress Note Due on Visit 10    PT Start Time [id number]    PT Stop Time [id number]    PT Time Calculation (min) 40 min             Past Medical History:  Diagnosis Date   Allergy    Atrial flutter ([place])    typical appearing   Basal cell carcinoma     nose - 1985   GERD (gastroesophageal reflux disease)    H/O cardiac radiofrequency ablation 2015   H/O gastric bypass 2005   History of diabetes mellitus 11/13/2015   Hypertension    Hypokalemia    Osteoporosis 2000   Paroxysmal atrial fibrillation ([place])    Personal history of colonic polyps - sessile serrated 11/23/2013   Past Surgical History:  Procedure Laterality Date   APPENDECTOMY     ATRIAL FLUTTER ABLATION N/A 09/05/2014   Procedure: ATRIAL FLUTTER ABLATION;  Surgeon: Thompson Grayer, MD;  Location: [facility];  Service: Cardiovascular;  Laterality: N/A;   BREAST EXCISIONAL BIOPSY Right 01/2016   CARPAL TUNNEL RELEASE Bilateral    FOOT SURGERY Bilateral 2012   shorten bones in both feet   GASTRIC BYPASS     removal of ovary Right 1987   SPINE SURGERY  2000/2012   anterior cervical fusion with plating   TOTAL ABDOMINAL HYSTERECTOMY  1999   WRIST SURGERY Right 2018   Dr. Apolonio Schneiders -ganglion excision   Patient Active Problem List   Diagnosis Date Noted   Tremor 08/14/2021   History of atrial fibrillation 08/03/2020   Bilateral dry eyes 03/28/2020   Vitreous membranes and strands, right 03/28/2020   Nuclear sclerotic cataract of right eye 03/28/2020    Nuclear sclerotic cataract of left eye 03/28/2020   Posterior vitreous detachment of left eye 03/28/2020   Posterior vitreous detachment of right eye 03/28/2020   Hyperglycemia 02/18/2019   S/P gastric bypass 07/09/2018   Achilles tendinosis 04/30/2018   Spondylosis of lumbar joint 05/28/2017   Plantar fasciitis, bilateral 05/28/2017   Chronic pain of right wrist 05/28/2017   Haglund's deformity of right heel 02/02/2017   Postmenopausal symptoms 06/02/2016   Arthritis of neck 07/19/2014   GERD (gastroesophageal reflux disease) 07/19/2014   Vitamin D deficiency 07/19/2014   Personal history of colonic polyps - sessile serrated 11/23/2013   Allergic rhinitis 11/11/2011     THERAPY DIAG:  Muscle weakness (generalized)  Other low back pain     REFERRING DIAG: M54.50 (ICD-10-CM) - Low back pain without sciatica, unspecified back pain laterality, unspecified chronicity   Rationale for Evaluation and Treatment: Rehabilitation     ONSET DATE: about a month ago    SUBJECTIVE: **Note De-Identified [place] [facility]** SUBJECTIVE STATEMENT: 07/28/2022 States she is feeling better and got her B12 injection and percussion gun. States she has the same sharp pain along the left side and is going to follow up with her gastro specialist.   Eval: States she tripped up some steps and landed on her tailbone. It hurt for a couple of days and then last week she woke up with her lower back hurting and had some sharp pains it not her left side. This lasted for 2 days and was painful both with standing and sitting.  States she is taking pain medicine which is helping.    Initially after she fell she could barely life her legs. States if she sits too long she gets fatigued all over     PERTINENT HISTORY:  Gastric bypass, osteopenia, ,DB, x2 cervical fusion  C3-5 (pt reports)   PAIN:  Are you having pain? Yes: NPRS scale: 0/10 Pain location: all the way across, sometimes sharp pain radiates into  Pain description: sore Aggravating factors: sitting, unsure Relieving factors: medication   PRECAUTIONS: None   WEIGHT BEARING RESTRICTIONS: No   FALLS:  Has patient fallen in last 6 months? Yes. 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 Alignment is normal. There is mild disc space narrowing at L3-L4, L4-L5 and L5-S1 with endplate osteophytes compatible with degenerative change. Alignment is stable with flexion and extension. There are mild degenerative changes of facet joints at L4-L5 and L5-S1. There surgical clips in the upper abdomen.   IMPRESSION: 1. No evidence of fracture or malalignment. 2. Mild degenerative changes in the lower lumbar spine.   PATIENT SURVEYS:  FOTO 16%   SCREENING FOR RED FLAGS: Bowel or bladder incontinence: No Spinal tumors: No Cauda equina syndrome: No Compression fracture: No Abdominal aneurysm: No   COGNITION: Overall cognitive status: Within functional limits for tasks assessed                              POSTURE: hinges at L5, sacral sitter   PALPATION: tenderness to palpation along left lower ribs and obliques.   LUMBAR ROM:    AROM eval  Flexion 25% limited   Extension 50% limited *  Right lateral flexion 25% limited *  Left lateral flexion 25% limited *  Right rotation    Left rotation     (Blank rows = not tested) *pain on the left side of back     LE Measurements       Lower Extremity Right 07/17/2022 Left 07/17/2022    A/PROM MMT A/PROM MMT  Hip Flexion 100* 4 100* 3+*  Hip Extension          Hip Abduction          Hip Adduction          Hip Internal rotation 25*   20*     Hip External rotation 45   60*    Knee Flexion   4   4  Knee Extension   4+   4+*  Ankle Dorsiflexion   4+   4  Ankle Plantarflexion          Ankle Inversion **Note De-Identified [place] [facility]** Ankle Eversion           (Blank rows = not tested) * pain     LUMBAR SPECIAL TESTS:  Slump neg B, SLR neg B Ely's test - + and pain in LBP with hip hike/lumbar ROT     TODAY'S TREATMENT:                                                                                                                              DATE:   07/28/2022 Therapeutic Exercise:    Aerobic: Supine:  LTR 2 minutes, hip IR 2 minutes, Hip ER 2 minutes, bridges 4x5     Seated: trunk rotation x30 5" holds, lumbar SB at wall x30 5" holds     Standing: SB  5" holds  x30 B, shoulder flexion up wall x30 5" hlds Neuromuscular Re-education:  Manual Therapy:  Therapeutic Activity: Self Care: Trigger Point Dry Needling:  Modalities:      PATIENT EDUCATION:  Education details: on HEP Person educated: Patient Education method: Explanation, Media planner, and Handouts Education comprehension: verbalized understanding     HOME EXERCISE PROGRAM: [id number]   ASSESSMENT:   CLINICAL IMPRESSION: 07/28/2022 Focused on answering all questions and progressing HEP. Overall tolerated session well and able to add all new exercises to HEP. Fatigue noted end of session but no pain. Printed off new exercises, will continue with current POC as tolerated.   Eval: Patient with reports of fatigue and tingling all over.  Reports she saw her recent MyChart results and interested to know what Dr. Marigene Ehlers neck step will be to help address vitamin B12 levels.  Answered all general questions about foods that help promote absorption of B12 and vitamin D.  Discussed following up with MD about any questions she has about recent blood work.  Positive response to soft tissue mobilization with percussion gun, instructed patient on how to safely utilize.  Reduced pain noted end of  session we will continue with current plan of care as tolerated. Eval:Patient complains of low back pain that radiates to front of abdomen after a fall on her coccyx about a month ago.  Patient with guarding during session reducing tolerance to interventions on this date.  Patient presents with muscle guarding, pain, strength and range of motion deficits that are likely contributing to current condition.  No imaging of coccyx has been done at this time and may be beneficial in the future if patient does not progress in function and reduction in symptoms.  Patient would greatly benefit from skilled physical therapy to improve overall quality of life and physical function.   OBJECTIVE IMPAIRMENTS: decreased activity tolerance, decreased balance, difficulty walking, decreased ROM, decreased strength, postural dysfunction, and pain.    ACTIVITY LIMITATIONS: lifting, sitting, standing, sleeping, and locomotion level   PARTICIPATION LIMITATIONS: community activity and occupation   PERSONAL FACTORS: Age and Fitness are also affecting patient's functional outcome.    REHAB POTENTIAL: Good   CLINICAL **Note De-Identified [place] [facility]** DECISION MAKING: Stable/uncomplicated   EVALUATION COMPLEXITY: Low     GOALS: Goals reviewed with patient? yes   SHORT TERM GOALS: Target date: 08/07/2022  Patient will be independent in self management strategies to improve quality of life and functional outcomes. Baseline: New Program Goal status: INITIAL   2.  Patient will report at least 50% improvement in overall symptoms and/or function to demonstrate improved functional mobility Baseline: 0% better Goal status: INITIAL   3.  Patient will demonstrate pain-free lumbar range of motion to improve gross mobility Baseline: Painful Goal status: INITIAL   4.  Patient will demonstrate sitting on sit bones regularly to reduce stress on tailbone Baseline: Currently sits on tailbone during day Goal status: INITIAL       LONG TERM GOALS: Target  date: 08/28/2022    Patient will report at least 75% improvement in overall symptoms and/or function to demonstrate improved functional mobility Baseline: 0% better Goal status: INITIAL   2.  Patient will improve score on FOTO outcomes measure to projected score to demonstrate overall improved function and QOL Baseline: see above Goal status: INITIAL   3.  Patient will demonstrate at least 4 out of 5 MMT in bilateral lower extremities to demonstrate improved strength Baseline: See above Goal status: INITIAL       PLAN:   PT FREQUENCY: 2x/week   PT DURATION: 6 weeks   PLANNED INTERVENTIONS: Therapeutic exercises, Therapeutic activity, Neuromuscular re-education, Balance training, Gait training, Patient/Family education, Self Care, Joint mobilization, Stair training, Vestibular training, DME instructions, Aquatic Therapy, Dry Needling, Electrical stimulation, Spinal manipulation, Spinal mobilization, Cryotherapy, Moist heat, Traction, Ultrasound, Ionotophoresis 13m/ml Dexamethasone, Manual therapy, and Re-evaluation.   PLAN FOR NEXT SESSION: breathing, pelvic tilts, self mobilization-> STM once tolerated, ROM lx/hip     2:29 PM, 07/28/22 MJerene Pitch DPT [facility]

## 2022-07-29 ENCOUNTER — Ambulatory Visit: Payer: BC Managed Care – PPO | Admitting: Physical Therapy

## 2022-07-29 ENCOUNTER — Encounter: Payer: Self-pay | Admitting: Physical Therapy

## 2022-07-29 DIAGNOSIS — M6281 Muscle weakness (generalized): Secondary | ICD-10-CM | POA: Diagnosis not present

## 2022-07-29 DIAGNOSIS — M5459 Other low back pain: Secondary | ICD-10-CM

## 2022-07-29 NOTE — Therapy (Signed)
**Note De-Identified [place] [facility]** OUTPATIENT PHYSICAL THERAPY TREATMENT NOTE   Patient Name: Felicia Schultz MRN: [id number] DOB:1951-01-05, 71 y.o., female Today's Date: 07/29/2022   PCP: Vivi Barrack, MD   REFERRING PROVIDER:   Loralee Pacas, MD    END OF SESSION:   PT End of Session - 07/29/22 1301     Visit Number 4    Number of Visits 12    Date for PT Re-Evaluation 08/28/22    Authorization Type BCBS VL 30    Authorization - Visit Number 4    Authorization - Number of Visits 30    Progress Note Due on Visit 10    PT Start Time [id number]    PT Stop Time 1340    PT Time Calculation (min) 38 min             Past Medical History:  Diagnosis Date   Allergy    Atrial flutter ([place])    typical appearing   Basal cell carcinoma     nose - 1985   GERD (gastroesophageal reflux disease)    H/O cardiac radiofrequency ablation 2015   H/O gastric bypass 2005   History of diabetes mellitus 11/13/2015   Hypertension    Hypokalemia    Osteoporosis 2000   Paroxysmal atrial fibrillation ([place])    Personal history of colonic polyps - sessile serrated 11/23/2013   Past Surgical History:  Procedure Laterality Date   APPENDECTOMY     ATRIAL FLUTTER ABLATION N/A 09/05/2014   Procedure: ATRIAL FLUTTER ABLATION;  Surgeon: Thompson Grayer, MD;  Location: [facility];  Service: Cardiovascular;  Laterality: N/A;   BREAST EXCISIONAL BIOPSY Right 01/2016   CARPAL TUNNEL RELEASE Bilateral    FOOT SURGERY Bilateral 2012   shorten bones in both feet   GASTRIC BYPASS     removal of ovary Right 1987   SPINE SURGERY  2000/2012   anterior cervical fusion with plating   TOTAL ABDOMINAL HYSTERECTOMY  1999   WRIST SURGERY Right 2018   Dr. Apolonio Schneiders -ganglion excision   Patient Active Problem List   Diagnosis Date Noted   Tremor 08/14/2021   History of atrial fibrillation 08/03/2020   Bilateral dry eyes 03/28/2020   Vitreous membranes and strands, right 03/28/2020   Nuclear sclerotic cataract of right eye 03/28/2020    Nuclear sclerotic cataract of left eye 03/28/2020   Posterior vitreous detachment of left eye 03/28/2020   Posterior vitreous detachment of right eye 03/28/2020   Hyperglycemia 02/18/2019   S/P gastric bypass 07/09/2018   Achilles tendinosis 04/30/2018   Spondylosis of lumbar joint 05/28/2017   Plantar fasciitis, bilateral 05/28/2017   Chronic pain of right wrist 05/28/2017   Haglund's deformity of right heel 02/02/2017   Postmenopausal symptoms 06/02/2016   Arthritis of neck 07/19/2014   GERD (gastroesophageal reflux disease) 07/19/2014   Vitamin D deficiency 07/19/2014   Personal history of colonic polyps - sessile serrated 11/23/2013   Allergic rhinitis 11/11/2011     THERAPY DIAG:  Muscle weakness (generalized)  Other low back pain     REFERRING DIAG: M54.50 (ICD-10-CM) - Low back pain without sciatica, unspecified back pain laterality, unspecified chronicity   Rationale for Evaluation and Treatment: Rehabilitation     ONSET DATE: about a month ago    SUBJECTIVE: **Note De-Identified [place] [facility]** SUBJECTIVE STATEMENT: 07/29/2022 States she is still a little fatigues but she feels 1000% better since last week. States she was sore after last session but used the percussion gun and it helped.    Eval: States she tripped up some steps and landed on her tailbone. It hurt for a couple of days and then last week she woke up with her lower back hurting and had some sharp pains it not her left side. This lasted for 2 days and was painful both with standing and sitting.  States she is taking pain medicine which is helping.    Initially after she fell she could barely life her legs. States if she sits too long she gets fatigued all over     PERTINENT HISTORY:  Gastric bypass, osteopenia, ,DB, x2 cervical fusion C3-5 (pt  reports)   PAIN:  Are you having pain? no: NPRS scale: 0/10 Pain location: all the way across, sometimes sharp pain radiates into  Pain description: sore Aggravating factors: sitting, unsure Relieving factors: medication   PRECAUTIONS: None   WEIGHT BEARING RESTRICTIONS: No   FALLS:  Has patient fallen in last 6 months? Yes. 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 Alignment is normal. There is mild disc space narrowing at L3-L4, L4-L5 and L5-S1 with endplate osteophytes compatible with degenerative change. Alignment is stable with flexion and extension. There are mild degenerative changes of facet joints at L4-L5 and L5-S1. There surgical clips in the upper abdomen.   IMPRESSION: 1. No evidence of fracture or malalignment. 2. Mild degenerative changes in the lower lumbar spine.   PATIENT SURVEYS:  FOTO 16%   SCREENING FOR RED FLAGS: Bowel or bladder incontinence: No Spinal tumors: No Cauda equina syndrome: No Compression fracture: No Abdominal aneurysm: No   COGNITION: Overall cognitive status: Within functional limits for tasks assessed                              POSTURE: hinges at L5, sacral sitter   PALPATION: tenderness to palpation along left lower ribs and obliques.   LUMBAR ROM:    AROM eval  Flexion 25% limited   Extension 50% limited *  Right lateral flexion 25% limited *  Left lateral flexion 25% limited *  Right rotation    Left rotation     (Blank rows = not tested) *pain on the left side of back     LE Measurements       Lower Extremity Right 07/17/2022 Left 07/17/2022    A/PROM MMT A/PROM MMT  Hip Flexion 100* 4 100* 3+*  Hip Extension          Hip Abduction          Hip Adduction          Hip Internal rotation 25*   20*    Hip  External rotation 45   60*    Knee Flexion   4   4  Knee Extension   4+   4+*  Ankle Dorsiflexion   4+   4  Ankle Plantarflexion          Ankle Inversion **Note De-Identified [place] [facility]** Ankle Eversion           (Blank rows = not tested) * pain     LUMBAR SPECIAL TESTS:  Slump neg B, SLR neg B Ely's test - + and pain in LBP with hip hike/lumbar ROT     TODAY'S TREATMENT:                                                                                                                              DATE:   07/29/2022 Therapeutic Exercise:    Aerobic: Supine:  LTR 2 minutes, hip IR 2 minutes, Hip ER 2 minutes, piriformis stretch x3 30" holds B, pelvic tilts 2 minutes, SLR with post tilt 4x5 B    Seated:     Standing  Neuromuscular Re-education:  Manual Therapy:  Therapeutic Activity: Self Care: Trigger Point Dry Needling:  Modalities:      PATIENT EDUCATION:  Education details: on HEP Person educated: Patient Education method: Explanation, Media planner, and Handouts Education comprehension: verbalized understanding     HOME EXERCISE PROGRAM: [id number]   ASSESSMENT:   CLINICAL IMPRESSION: 07/29/2022 Session focused on progression of exercises and review of HEP. Tolerated all exercises well with no reports of pain. Verbal cues throughout for form Soreness noted end of session. Will continue with current OC as tolerated.   Eval: Patient with reports of fatigue and tingling all over.  Reports she saw her recent MyChart results and interested to know what Dr. Marigene Ehlers neck step will be to help address vitamin B12 levels.  Answered all general questions about foods that help promote absorption of B12 and vitamin D.  Discussed following up with MD about any questions she has about recent blood work.  Positive response to soft tissue mobilization with percussion gun, instructed patient on how to safely utilize.  Reduced pain noted end of session we will continue with current plan of care as  tolerated. Eval:Patient complains of low back pain that radiates to front of abdomen after a fall on her coccyx about a month ago.  Patient with guarding during session reducing tolerance to interventions on this date.  Patient presents with muscle guarding, pain, strength and range of motion deficits that are likely contributing to current condition.  No imaging of coccyx has been done at this time and may be beneficial in the future if patient does not progress in function and reduction in symptoms.  Patient would greatly benefit from skilled physical therapy to improve overall quality of life and physical function.   OBJECTIVE IMPAIRMENTS: decreased activity tolerance, decreased balance, difficulty walking, decreased ROM, decreased strength, postural dysfunction, and pain.    ACTIVITY LIMITATIONS: lifting, sitting, standing, sleeping, and locomotion level   PARTICIPATION LIMITATIONS: community activity and occupation   PERSONAL FACTORS: Age and Fitness are also affecting patient's functional outcome.    REHAB POTENTIAL: Good   CLINICAL DECISION MAKING: Stable/uncomplicated   EVALUATION COMPLEXITY: Low     GOALS: Goals reviewed with **Note De-Identified [place] [facility]uscation** patient? yes   SHORT TERM GOALS: Target date: 08/07/2022  Patient will be independent in self management strategies to improve quality of life and functional outcomes. Baseline: New Program Goal status: INITIAL   2.  Patient will report at least 50% improvement in overall symptoms and/or function to demonstrate improved functional mobility Baseline: 0% better Goal status: INITIAL   3.  Patient will demonstrate pain-free lumbar range of motion to improve gross mobility Baseline: Painful Goal status: INITIAL   4.  Patient will demonstrate sitting on sit bones regularly to reduce stress on tailbone Baseline: Currently sits on tailbone during day Goal status: INITIAL       LONG TERM GOALS: Target date: 08/28/2022    Patient will report at least 75%  improvement in overall symptoms and/or function to demonstrate improved functional mobility Baseline: 0% better Goal status: INITIAL   2.  Patient will improve score on FOTO outcomes measure to projected score to demonstrate overall improved function and QOL Baseline: see above Goal status: INITIAL   3.  Patient will demonstrate at least 4 out of 5 MMT in bilateral lower extremities to demonstrate improved strength Baseline: See above Goal status: INITIAL       PLAN:   PT FREQUENCY: 2x/week   PT DURATION: 6 weeks   PLANNED INTERVENTIONS: Therapeutic exercises, Therapeutic activity, Neuromuscular re-education, Balance training, Gait training, Patient/Family education, Self Care, Joint mobilization, Stair training, Vestibular training, DME instructions, Aquatic Therapy, Dry Needling, Electrical stimulation, Spinal manipulation, Spinal mobilization, Cryotherapy, Moist heat, Traction, Ultrasound, Ionotophoresis 56m/ml Dexamethasone, Manual therapy, and Re-evaluation.   PLAN FOR NEXT SESSION: breathing, pelvic tilts, self mobilization-> STM once tolerated, ROM lx/hip     2:02 PM, 07/29/22 MJerene Pitch DPT [facility]

## 2022-07-31 ENCOUNTER — Ambulatory Visit (INDEPENDENT_AMBULATORY_CARE_PROVIDER_SITE_OTHER): Payer: BC Managed Care – PPO | Admitting: *Deleted

## 2022-07-31 DIAGNOSIS — E538 Deficiency of other specified B group vitamins: Secondary | ICD-10-CM

## 2022-07-31 MED ORDER — CYANOCOBALAMIN 1000 MCG/ML IJ SOLN
1000.0000 ug | Freq: Once | INTRAMUSCULAR | Status: AC
  Administered 2022-07-31: 1000 ug via INTRAMUSCULAR

## 2022-07-31 NOTE — Progress Notes (Signed)
**Note De-Identified [place] [facility]uscation** Per orders of Dr. Jerline Pain, injection of B 12 given in left deltoid per patient preference by Zacarias Pontes, CMA. Patient tolerated injection well. Patient advised to scheduled next injection.

## 2022-07-31 NOTE — Progress Notes (Signed)
**Note De-Identified [place] [facility]ation** I have reviewed the patient's encounter and agree with the documentation.  Algis Greenhouse. Jerline Pain, MD 07/31/2022 3:32 PM

## 2022-08-06 ENCOUNTER — Encounter: Payer: Self-pay | Admitting: Physical Therapy

## 2022-08-06 ENCOUNTER — Ambulatory Visit: Payer: BC Managed Care – PPO | Admitting: Physical Therapy

## 2022-08-06 DIAGNOSIS — M5459 Other low back pain: Secondary | ICD-10-CM | POA: Diagnosis not present

## 2022-08-06 DIAGNOSIS — M6281 Muscle weakness (generalized): Secondary | ICD-10-CM

## 2022-08-06 NOTE — Therapy (Signed)
**Note De-Identified [facility]** OUTPATIENT PHYSICAL THERAPY TREATMENT NOTE   Patient Name: Felicia Schultz MRN: [id number] DOB:[birth year], 72 y.o., female Today's Date: 08/06/2022   PCP: Vivi Barrack, MD   REFERRING PROVIDER:   Loralee Pacas, MD    END OF SESSION:   PT End of Session - 08/06/22 1345     Visit Number 5    Number of Visits 12    Date for PT Re-Evaluation 08/28/22    Authorization Type BCBS VL 30    Authorization - Visit Number 5    Authorization - Number of Visits 30    Progress Note Due on Visit 10    PT Start Time [id number]    PT Stop Time [id number]    PT Time Calculation (min) 40 min             Past Medical History:  Diagnosis Date   Allergy    Atrial flutter ([place])    typical appearing   Basal cell carcinoma     nose - 1985   GERD (gastroesophageal reflux disease)    H/O cardiac radiofrequency ablation 2015   H/O gastric bypass 2005   History of diabetes mellitus 11/13/2015   Hypertension    Hypokalemia    Osteoporosis 2000   Paroxysmal atrial fibrillation ([place])    Personal history of colonic polyps - sessile serrated 11/23/2013   Past Surgical History:  Procedure Laterality Date   APPENDECTOMY     ATRIAL FLUTTER ABLATION N/A 09/05/2014   Procedure: ATRIAL FLUTTER ABLATION;  Surgeon: Thompson Grayer, MD;  Location: [facility] CATH LAB;  Service: Cardiovascular;  Laterality: N/A;   BREAST EXCISIONAL BIOPSY Right 01/2016   CARPAL TUNNEL RELEASE Bilateral    FOOT SURGERY Bilateral 2012   shorten bones in both feet   GASTRIC BYPASS     removal of ovary Right 1987   SPINE SURGERY  2000/2012   anterior cervical fusion with plating   TOTAL ABDOMINAL HYSTERECTOMY  1999   WRIST SURGERY Right 2018   Dr. Apolonio Schneiders -ganglion excision   Patient Active Problem List   Diagnosis Date Noted   Tremor 08/14/2021   History of atrial fibrillation 08/03/2020   Bilateral dry eyes 03/28/2020   Vitreous membranes and strands, right 03/28/2020   Nuclear sclerotic cataract of right eye 03/28/2020    Nuclear sclerotic cataract of left eye 03/28/2020   Posterior vitreous detachment of left eye 03/28/2020   Posterior vitreous detachment of right eye 03/28/2020   Hyperglycemia 02/18/2019   S/P gastric bypass 07/09/2018   Achilles tendinosis 04/30/2018   Spondylosis of lumbar joint 05/28/2017   Plantar fasciitis, bilateral 05/28/2017   Chronic pain of right wrist 05/28/2017   Haglund's deformity of right heel 02/02/2017   Postmenopausal symptoms 06/02/2016   Arthritis of neck 07/19/2014   GERD (gastroesophageal reflux disease) 07/19/2014   Vitamin D deficiency 07/19/2014   Personal history of colonic polyps - sessile serrated 11/23/2013   Allergic rhinitis 11/11/2011     THERAPY DIAG:  Muscle weakness (generalized)  Other low back pain     REFERRING DIAG: M54.50 (ICD-10-CM) - Low back pain without sciatica, unspecified back pain laterality, unspecified chronicity   Rationale for Evaluation and Treatment: Rehabilitation     ONSET DATE: about a month ago    SUBJECTIVE: **Note De-Identified [place] [facility]** SUBJECTIVE STATEMENT: 08/06/2022 States she gets better every day, no pain and no difficulties with her exercises.    Eval: States she tripped up some steps and landed on her tailbone. It hurt for a couple of days and then last week she woke up with her lower back hurting and had some sharp pains it not her left side. This lasted for 2 days and was painful both with standing and sitting.  States she is taking pain medicine which is helping.    Initially after she fell she could barely life her legs. States if she sits too long she gets fatigued all over     PERTINENT HISTORY:  Gastric bypass, osteopenia, ,DB, x2 cervical fusion C3-5 (pt reports)   PAIN:  Are you having pain? no: NPRS scale: 0/10 Pain location: all the way  across, sometimes sharp pain radiates into  Pain description: sore Aggravating factors: sitting, unsure Relieving factors: medication   PRECAUTIONS: None   WEIGHT BEARING RESTRICTIONS: No   FALLS:  Has patient fallen in last 6 months? Yes. 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 Alignment is normal. There is mild disc space narrowing at L3-L4, L4-L5 and L5-S1 with endplate osteophytes compatible with degenerative change. Alignment is stable with flexion and extension. There are mild degenerative changes of facet joints at L4-L5 and L5-S1. There surgical clips in the upper abdomen.   IMPRESSION: 1. No evidence of fracture or malalignment. 2. Mild degenerative changes in the lower lumbar spine.   PATIENT SURVEYS:  FOTO 16%   SCREENING FOR RED FLAGS: Bowel or bladder incontinence: No Spinal tumors: No Cauda equina syndrome: No Compression fracture: No Abdominal aneurysm: No   COGNITION: Overall cognitive status: Within functional limits for tasks assessed                              POSTURE: hinges at L5, sacral sitter   PALPATION: tenderness to palpation along left lower ribs and obliques.   LUMBAR ROM:    AROM eval  Flexion 25% limited   Extension 50% limited *  Right lateral flexion 25% limited *  Left lateral flexion 25% limited *  Right rotation    Left rotation     (Blank rows = not tested) *pain on the left side of back     LE Measurements       Lower Extremity Right 07/17/2022 Left 07/17/2022    A/PROM MMT A/PROM MMT  Hip Flexion 100* 4 100* 3+*  Hip Extension          Hip Abduction          Hip Adduction          Hip Internal rotation 25*   20*    Hip External rotation 45   60*    Knee Flexion   4   4  Knee Extension   4+   4+*  Ankle  Dorsiflexion   4+   4  Ankle Plantarflexion          Ankle Inversion          Ankle Eversion           (Blank rows = not **Note De-Identified [facility]** tested) * pain     LUMBAR SPECIAL TESTS:  Slump neg B, SLR neg B Ely's test - + and pain in LBP with hip hike/lumbar ROT     TODAY'S TREATMENT:                                                                                                                              DATE:   08/06/2022 Therapeutic Exercise:    Aerobic: Supine:     Seated: piriformis stretch x3 30" holds B, hamstring x3 30" holds B, trunk rotation x25 5" holds B, lumbar flexion x20 5" holds, STS 4x5, lumbar SB x20 B, thoracic/lumbar extension x20 5" holds    Standing  Neuromuscular Re-education:  Manual Therapy:  Therapeutic Activity: Self Care: Trigger Point Dry Needling:  Modalities:      PATIENT EDUCATION:  Education details: review of all HEP Person educated: Patient Education method: Explanation, Demonstration, and Handouts Education comprehension: verbalized understanding     HOME EXERCISE PROGRAM: [id number]   ASSESSMENT:   CLINICAL IMPRESSION: 08/06/2022 Focused on progressing exercises and adding seated exercises for patient to perform throughout the day at work. No pain noted during session. Answered all questions about form and new exercises. Overall patient is doing well and moving better than previously.  Eval: Patient with reports of fatigue and tingling all over.  Reports she saw her recent MyChart results and interested to know what Dr. Marigene Ehlers neck step will be to help address vitamin B12 levels.  Answered all general questions about foods that help promote absorption of B12 and vitamin D.  Discussed following up with MD about any questions she has about recent blood work.  Positive response to soft tissue mobilization with percussion gun, instructed patient on how to safely utilize.  Reduced pain noted end of session we will continue with current plan of care as  tolerated. Eval:Patient complains of low back pain that radiates to front of abdomen after a fall on her coccyx about a month ago.  Patient with guarding during session reducing tolerance to interventions on this date.  Patient presents with muscle guarding, pain, strength and range of motion deficits that are likely contributing to current condition.  No imaging of coccyx has been done at this time and may be beneficial in the future if patient does not progress in function and reduction in symptoms.  Patient would greatly benefit from skilled physical therapy to improve overall quality of life and physical function.   OBJECTIVE IMPAIRMENTS: decreased activity tolerance, decreased balance, difficulty walking, decreased ROM, decreased strength, postural dysfunction, and pain.    ACTIVITY LIMITATIONS: lifting, sitting, standing, sleeping, and locomotion level   PARTICIPATION LIMITATIONS: community activity and occupation   PERSONAL FACTORS: Age and Fitness are also affecting patient's functional outcome.    REHAB POTENTIAL: Good   CLINICAL DECISION MAKING: Stable/uncomplicated   EVALUATION COMPLEXITY: Low     GOALS: Goals reviewed with patient? yes   SHORT TERM **Note De-Identified [facility]** GOALS: Target date: 08/07/2022  Patient will be independent in self management strategies to improve quality of life and functional outcomes. Baseline: New Program Goal status: INITIAL   2.  Patient will report at least 50% improvement in overall symptoms and/or function to demonstrate improved functional mobility Baseline: 0% better Goal status: INITIAL   3.  Patient will demonstrate pain-free lumbar range of motion to improve gross mobility Baseline: Painful Goal status: INITIAL   4.  Patient will demonstrate sitting on sit bones regularly to reduce stress on tailbone Baseline: Currently sits on tailbone during day Goal status: INITIAL       LONG TERM GOALS: Target date: 08/28/2022    Patient will report at least 75%  improvement in overall symptoms and/or function to demonstrate improved functional mobility Baseline: 0% better Goal status: INITIAL   2.  Patient will improve score on FOTO outcomes measure to projected score to demonstrate overall improved function and QOL Baseline: see above Goal status: INITIAL   3.  Patient will demonstrate at least 4 out of 5 MMT in bilateral lower extremities to demonstrate improved strength Baseline: See above Goal status: INITIAL       PLAN:   PT FREQUENCY: 2x/week   PT DURATION: 6 weeks   PLANNED INTERVENTIONS: Therapeutic exercises, Therapeutic activity, Neuromuscular re-education, Balance training, Gait training, Patient/Family education, Self Care, Joint mobilization, Stair training, Vestibular training, DME instructions, Aquatic Therapy, Dry Needling, Electrical stimulation, Spinal manipulation, Spinal mobilization, Cryotherapy, Moist heat, Traction, Ultrasound, Ionotophoresis 24m/ml Dexamethasone, Manual therapy, and Re-evaluation.   PLAN FOR NEXT SESSION: breathing, pelvic tilts, self mobilization-> STM once tolerated, ROM lx/hip     2:26 PM, 08/06/22 MJerene Pitch DPT [facility]

## 2022-08-07 ENCOUNTER — Encounter: Payer: Self-pay | Admitting: Physical Therapy

## 2022-08-07 ENCOUNTER — Ambulatory Visit (INDEPENDENT_AMBULATORY_CARE_PROVIDER_SITE_OTHER): Payer: BC Managed Care – PPO

## 2022-08-07 ENCOUNTER — Ambulatory Visit: Payer: BC Managed Care – PPO | Admitting: Physical Therapy

## 2022-08-07 DIAGNOSIS — E538 Deficiency of other specified B group vitamins: Secondary | ICD-10-CM | POA: Diagnosis not present

## 2022-08-07 DIAGNOSIS — M6281 Muscle weakness (generalized): Secondary | ICD-10-CM | POA: Diagnosis not present

## 2022-08-07 DIAGNOSIS — M5459 Other low back pain: Secondary | ICD-10-CM

## 2022-08-07 MED ORDER — CYANOCOBALAMIN 1000 MCG/ML IJ SOLN
1000.0000 ug | Freq: Once | INTRAMUSCULAR | Status: AC
  Administered 2022-08-07: 1000 ug via INTRAMUSCULAR

## 2022-08-07 NOTE — Progress Notes (Signed)
**Note De-Identified [facility]** Pt in office for nurse visit due to vitamin B-12 deficiency. Administered 1000 mcg/mL Cyanocobalamin injection to patient in left deltoid and patient tolerated well with no issues.

## 2022-08-07 NOTE — Therapy (Addendum)
**Note De-Identified [facility]** OUTPATIENT PHYSICAL THERAPY TREATMENT NOTE PHYSICAL THERAPY DISCHARGE SUMMARY  Visits from Start of Care: 6  Current functional level related to goals / functional outcomes: Unable to assess due to unplanned discharge    Remaining deficits: Unable to assess due to unplanned discharge    Education / Equipment: Unable to assess due to unplanned discharge    Patient agrees to discharge. Patient goals were not met. Patient is being discharged due to not returning since the last visit.  9:20 AM, 12/09/22 Tereasa Coop, DPT Physical Therapy with [facility]     Patient Name: Gale Klar Hensler MRN: [id number] DOB:[birth year], 72 y.o., female Today's Date: 08/07/2022   PCP: Ardith Dark, MD   REFERRING PROVIDER:   Lula Olszewski, MD    END OF SESSION:   PT End of Session - 08/07/22 1514     Visit Number 6    Number of Visits 12    Date for PT Re-Evaluation 08/28/22    Authorization Type BCBS VL 30    Authorization - Visit Number 6    Authorization - Number of Visits 30    Progress Note Due on Visit 10    PT Start Time 1517    PT Stop Time 1555    PT Time Calculation (min) 38 min             Past Medical History:  Diagnosis Date   Allergy    Atrial flutter ([facility])    typical appearing   Basal cell carcinoma     nose - 1985   GERD (gastroesophageal reflux disease)    H/O cardiac radiofrequency ablation 2015   H/O gastric bypass 2005   History of diabetes mellitus 11/13/2015   Hypertension    Hypokalemia    Osteoporosis 2000   Paroxysmal atrial fibrillation ([facility])    Personal history of colonic polyps - sessile serrated 11/23/2013   Past Surgical History:  Procedure Laterality Date   APPENDECTOMY     ATRIAL FLUTTER ABLATION N/A 09/05/2014   Procedure: ATRIAL FLUTTER ABLATION;  Surgeon: Hillis Range, MD;  Location: [facility] CATH LAB;  Service: Cardiovascular;  Laterality: N/A;   BREAST EXCISIONAL BIOPSY Right 01/2016   CARPAL TUNNEL RELEASE Bilateral    FOOT  SURGERY Bilateral 2012   shorten bones in both feet   GASTRIC BYPASS     removal of ovary Right 1987   SPINE SURGERY  2000/2012   anterior cervical fusion with plating   TOTAL ABDOMINAL HYSTERECTOMY  1999   WRIST SURGERY Right 2018   Dr. Orlan Leavens -ganglion excision   Patient Active Problem List   Diagnosis Date Noted   Tremor 08/14/2021   History of atrial fibrillation 08/03/2020   Bilateral dry eyes 03/28/2020   Vitreous membranes and strands, right 03/28/2020   Nuclear sclerotic cataract of right eye 03/28/2020   Nuclear sclerotic cataract of left eye 03/28/2020   Posterior vitreous detachment of left eye 03/28/2020   Posterior vitreous detachment of right eye 03/28/2020   Hyperglycemia 02/18/2019   S/P gastric bypass 07/09/2018   Achilles tendinosis 04/30/2018   Spondylosis of lumbar joint 05/28/2017   Plantar fasciitis, bilateral 05/28/2017   Chronic pain of right wrist 05/28/2017   Haglund's deformity of right heel 02/02/2017   Postmenopausal symptoms 06/02/2016   Arthritis of neck 07/19/2014   GERD (gastroesophageal reflux disease) 07/19/2014   Vitamin D deficiency 07/19/2014   Personal history of colonic polyps - sessile serrated 11/23/2013   Allergic rhinitis 11/11/2011     THERAPY **Note De-Identified [place] [facility]** DIAG:  Muscle weakness (generalized)  Other low back pain     REFERRING DIAG: M54.50 (ICD-10-CM) - Low back pain without sciatica, unspecified back pain laterality, unspecified chronicity   Rationale for Evaluation and Treatment: Rehabilitation     ONSET DATE: about a month ago    SUBJECTIVE:                                                                                                                                                                                            SUBJECTIVE STATEMENT: 08/07/2022 States she is starting back her to her regular job Monday through Friday. States that she feels like her chair is the problem.    Eval: States she tripped up some steps and  landed on her tailbone. It hurt for a couple of days and then last week she woke up with her lower back hurting and had some sharp pains it not her left side. This lasted for 2 days and was painful both with standing and sitting.  States she is taking pain medicine which is helping.    Initially after she fell she could barely life her legs. States if she sits too long she gets fatigued all over     PERTINENT HISTORY:  Gastric bypass, osteopenia, ,DB, x2 cervical fusion C3-5 (pt reports)   PAIN:  Are you having pain? no: NPRS scale: 0/10 Pain location: all the way across, sometimes sharp pain radiates into  Pain description: sore Aggravating factors: sitting, unsure Relieving factors: medication   PRECAUTIONS: None   WEIGHT BEARING RESTRICTIONS: No   FALLS:  Has patient fallen in last 6 months? Yes. 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 Alignment is normal. There is mild disc space narrowing at L3-L4, L4-L5 and L5-S1 with endplate osteophytes compatible with degenerative change. Alignment is stable with flexion and extension. There are mild degenerative changes of facet joints at L4-L5 and L5-S1. There surgical clips in the upper abdomen.   IMPRESSION: 1. No evidence of fracture or malalignment. 2. Mild degenerative changes in the lower lumbar spine.   PATIENT SURVEYS:  FOTO 16%   SCREENING FOR RED FLAGS: Bowel or bladder incontinence: No Spinal tumors: No Cauda equina syndrome: No Compression fracture: No Abdominal aneurysm: No   COGNITION: Overall cognitive status: Within functional limits for tasks assessed **Note De-Identified [place] [facility]** POSTURE: hinges at L5, sacral sitter   PALPATION: tenderness to palpation along left lower ribs and obliques.   LUMBAR  ROM:    AROM eval  Flexion 25% limited   Extension 50% limited *  Right lateral flexion 25% limited *  Left lateral flexion 25% limited *  Right rotation    Left rotation     (Blank rows = not tested) *pain on the left side of back     LE Measurements       Lower Extremity Right 07/17/2022 Left 07/17/2022    A/PROM MMT A/PROM MMT  Hip Flexion 100* 4 100* 3+*  Hip Extension          Hip Abduction          Hip Adduction          Hip Internal rotation 25*   20*    Hip External rotation 45   60*    Knee Flexion   4   4  Knee Extension   4+   4+*  Ankle Dorsiflexion   4+   4  Ankle Plantarflexion          Ankle Inversion          Ankle Eversion           (Blank rows = not tested) * pain     LUMBAR SPECIAL TESTS:  Slump neg B, SLR neg B Ely's test - + and pain in LBP with hip hike/lumbar ROT     TODAY'S TREATMENT:                                                                                                                              DATE:   08/07/2022 Therapeutic Exercise:    Aerobic: Supine:     Seated: STS slow and controlled and then with holds 15 minutes told      Standing: wall holds and presses 10 minutes total  Neuromuscular Re-education: posture/ergonomic set at work Manual Therapy:  Therapeutic Activity: Self Care: Trigger Point Dry Needling:  Modalities:      PATIENT EDUCATION:  Education details: review of all HEP, on chair support  and different supports for the work chair and what to look for  Person educated: Patient Education method: Programmer, multimedia, Facilities manager, and Handouts Education comprehension: verbalized understanding     HOME EXERCISE PROGRAM: [id number]   ASSESSMENT:   CLINICAL IMPRESSION: 08/07/2022 Session focused on review of all home exercises as well as additional lower extremity strengthening exercises patient could perform at work.  Focused on squats as patient has desire to be able to squat deeper without assistance from  nearby object to return to upright position.  Discussed and educated patient on proper ergonomic set up at work with handout provided about this set up.  Discussed different types of support that could be added to any trigger.  Overall patient is doing very well and will be placed on hold at this **Note De-Identified [place] [facility]** time secondary to return to working 5 days a week during normal workday.  Patient to call and schedule within the next 3 weeks if needed if patient does not return by then patient will be discharged secondary to reduced pain and progress in low back.  Eval: Patient with reports of fatigue and tingling all over.  Reports she saw her recent MyChart results and interested to know what Dr. Lavone Neri neck step will be to help address vitamin B12 levels.  Answered all general questions about foods that help promote absorption of B12 and vitamin D.  Discussed following up with MD about any questions she has about recent blood work.  Positive response to soft tissue mobilization with percussion gun, instructed patient on how to safely utilize.  Reduced pain noted end of session we will continue with current plan of care as tolerated. Eval:Patient complains of low back pain that radiates to front of abdomen after a fall on her coccyx about a month ago.  Patient with guarding during session reducing tolerance to interventions on this date.  Patient presents with muscle guarding, pain, strength and range of motion deficits that are likely contributing to current condition.  No imaging of coccyx has been done at this time and may be beneficial in the future if patient does not progress in function and reduction in symptoms.  Patient would greatly benefit from skilled physical therapy to improve overall quality of life and physical function.   OBJECTIVE IMPAIRMENTS: decreased activity tolerance, decreased balance, difficulty walking, decreased ROM, decreased strength, postural dysfunction, and pain.    ACTIVITY LIMITATIONS:  lifting, sitting, standing, sleeping, and locomotion level   PARTICIPATION LIMITATIONS: community activity and occupation   PERSONAL FACTORS: Age and Fitness are also affecting patient's functional outcome.    REHAB POTENTIAL: Good   CLINICAL DECISION MAKING: Stable/uncomplicated   EVALUATION COMPLEXITY: Low     GOALS: Goals reviewed with patient? yes   SHORT TERM GOALS: Target date: 08/07/2022  Patient will be independent in self management strategies to improve quality of life and functional outcomes. Baseline: New Program Goal status: INITIAL   2.  Patient will report at least 50% improvement in overall symptoms and/or function to demonstrate improved functional mobility Baseline: 0% better Goal status: INITIAL   3.  Patient will demonstrate pain-free lumbar range of motion to improve gross mobility Baseline: Painful Goal status: INITIAL   4.  Patient will demonstrate sitting on sit bones regularly to reduce stress on tailbone Baseline: Currently sits on tailbone during day Goal status: INITIAL       LONG TERM GOALS: Target date: 08/28/2022    Patient will report at least 75% improvement in overall symptoms and/or function to demonstrate improved functional mobility Baseline: 0% better Goal status: INITIAL   2.  Patient will improve score on FOTO outcomes measure to projected score to demonstrate overall improved function and QOL Baseline: see above Goal status: INITIAL   3.  Patient will demonstrate at least 4 out of 5 MMT in bilateral lower extremities to demonstrate improved strength Baseline: See above Goal status: INITIAL       PLAN:   PT FREQUENCY: 2x/week   PT DURATION: 6 weeks   PLANNED INTERVENTIONS: Therapeutic exercises, Therapeutic activity, Neuromuscular re-education, Balance training, Gait training, Patient/Family education, Self Care, Joint mobilization, Stair training, Vestibular training, DME instructions, Aquatic Therapy, Dry Needling,  Electrical stimulation, Spinal manipulation, Spinal mobilization, Cryotherapy, Moist heat, Traction, Ultrasound, Ionotophoresis 4mg /ml Dexamethasone, Manual therapy, and Re-evaluation.   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

## 2022-08-11 ENCOUNTER — Encounter: Payer: BC Managed Care – PPO | Admitting: Physical Therapy

## 2022-08-12 ENCOUNTER — Encounter: Payer: BC Managed Care – PPO | Admitting: Physical Therapy

## 2022-08-14 ENCOUNTER — Ambulatory Visit (INDEPENDENT_AMBULATORY_CARE_PROVIDER_SITE_OTHER): Payer: BC Managed Care – PPO

## 2022-08-14 DIAGNOSIS — E538 Deficiency of other specified B group vitamins: Secondary | ICD-10-CM

## 2022-08-14 MED ORDER — CYANOCOBALAMIN 1000 MCG/ML IJ SOLN
1000.0000 ug | Freq: Once | INTRAMUSCULAR | Status: AC
  Administered 2022-08-14: 1000 ug via INTRAMUSCULAR

## 2022-08-14 NOTE — Progress Notes (Signed)
**Note De-Identified [place] [facility]ation** Pt here for B12 injection for Dr Jerline Pain. Injection given in left deltoid. Pt tolerated well.

## 2022-08-18 ENCOUNTER — Ambulatory Visit (INDEPENDENT_AMBULATORY_CARE_PROVIDER_SITE_OTHER): Payer: BC Managed Care – PPO | Admitting: Family Medicine

## 2022-08-18 ENCOUNTER — Encounter: Payer: Self-pay | Admitting: Family Medicine

## 2022-08-18 VITALS — BP 134/80 | HR 69 | Temp 97.1°F | Ht 64.0 in | Wt 189.8 lb

## 2022-08-18 DIAGNOSIS — E538 Deficiency of other specified B group vitamins: Secondary | ICD-10-CM | POA: Diagnosis not present

## 2022-08-18 DIAGNOSIS — Z0001 Encounter for general adult medical examination with abnormal findings: Secondary | ICD-10-CM | POA: Diagnosis not present

## 2022-08-18 DIAGNOSIS — J309 Allergic rhinitis, unspecified: Secondary | ICD-10-CM

## 2022-08-18 DIAGNOSIS — E559 Vitamin D deficiency, unspecified: Secondary | ICD-10-CM | POA: Diagnosis not present

## 2022-08-18 DIAGNOSIS — R739 Hyperglycemia, unspecified: Secondary | ICD-10-CM

## 2022-08-18 DIAGNOSIS — N959 Unspecified menopausal and perimenopausal disorder: Secondary | ICD-10-CM

## 2022-08-18 NOTE — Patient Instructions (Signed)
**Note De-Identified Gradilla Obfuscation** It was very nice to see you today!  Please come back in a few months to recheck your labs.   You can try taking allegra and pataday for your allergies.   Please continue to work on diet and exercise.  Will see back in a year for your next physical.  Come back sooner if needed.  Take care, Dr Jerline Pain  PLEASE NOTE:  If you had any lab tests, please let us know if you have not heard back within a few days. You may see your results on mychart before we have a chance to review them but we will give you a call once they are reviewed by [place].   If we ordered any referrals today, please let us know if you have not heard from their office within the next week.   If you had any urgent prescriptions sent in today, please check with the pharmacy within an hour of our visit to make sure the prescription was transmitted appropriately.   Please try these tips to maintain a healthy lifestyle:  Eat at least 3 REAL meals and 1-2 snacks per day.  Aim for no more than 5 hours between eating.  If you eat breakfast, please do so within one hour of getting up.   Each meal should contain half fruits/vegetables, one quarter protein, and one quarter carbs (no bigger than a computer mouse)  Cut down on sweet beverages. This includes juice, soda, and sweet tea.   Drink at least 1 glass of water with each meal and aim for at least 8 glasses per day  Exercise at least 150 minutes every week.    Preventive Care 60 Years and Older, Female Preventive care refers to lifestyle choices and visits with your health care provider that can promote health and wellness. Preventive care visits are also called wellness exams. What can I expect for my preventive care visit? Counseling Your health care provider may ask you questions about your: Medical history, including: Past medical problems. Family medical history. Pregnancy and menstrual history. History of falls. Current health, including: Memory and ability to  understand (cognition). Emotional well-being. Home life and relationship well-being. Sexual activity and sexual health. Lifestyle, including: Alcohol, nicotine or tobacco, and drug use. Access to firearms. Diet, exercise, and sleep habits. Work and work Statistician. Sunscreen use. Safety issues such as seatbelt and bike helmet use. Physical exam Your health care provider will check your: Height and weight. These may be used to calculate your BMI (body mass index). BMI is a measurement that tells if you are at a healthy weight. Waist circumference. This measures the distance around your waistline. This measurement also tells if you are at a healthy weight and may help predict your risk of certain diseases, such as type 2 diabetes and high blood pressure. Heart rate and blood pressure. Body temperature. Skin for abnormal spots. What immunizations do I need?  Vaccines are usually given at various ages, according to a schedule. Your health care provider will recommend vaccines for you based on your age, medical history, and lifestyle or other factors, such as travel or where you work. What tests do I need? Screening Your health care provider may recommend screening tests for certain conditions. This may include: Lipid and cholesterol levels. Hepatitis C test. Hepatitis B test. HIV (human immunodeficiency virus) test. STI (sexually transmitted infection) testing, if you are at risk. Lung cancer screening. Colorectal cancer screening. Diabetes screening. This is done by checking your blood sugar (glucose) after you **Note De-Identified Poole Obfuscation** have not eaten for a while (fasting). Mammogram. Talk with your health care provider about how often you should have regular mammograms. BRCA-related cancer screening. This may be done if you have a family history of breast, ovarian, tubal, or peritoneal cancers. Bone density scan. This is done to screen for osteoporosis. Talk with your health care provider about your test  results, treatment options, and if necessary, the need for more tests. Follow these instructions at home: Eating and drinking  Eat a diet that includes fresh fruits and vegetables, whole grains, lean protein, and low-fat dairy products. Limit your intake of foods with high amounts of sugar, saturated fats, and salt. Take vitamin and mineral supplements as recommended by your health care provider. Do not drink alcohol if your health care provider tells you not to drink. If you drink alcohol: Limit how much you have to 0-1 drink a day. Know how much alcohol is in your drink. In the [place] one drink equals one 12 oz bottle of beer (355 mL), one 5 oz glass of wine (148 mL), or one 1 oz glass of hard liquor (44 mL). Lifestyle Brush your teeth every morning and night with fluoride toothpaste. Floss one time each day. Exercise for at least 30 minutes 5 or more days each week. Do not use any products that contain nicotine or tobacco. These products include cigarettes, chewing tobacco, and vaping devices, such as e-cigarettes. If you need help quitting, ask your health care provider. Do not use drugs. If you are sexually active, practice safe sex. Use a condom or other form of protection in order to prevent STIs. Take aspirin only as told by your health care provider. Make sure that you understand how much to take and what form to take. Work with your health care provider to find out whether it is safe and beneficial for you to take aspirin daily. Ask your health care provider if you need to take a cholesterol-lowering medicine (statin). Find healthy ways to manage stress, such as: Meditation, yoga, or listening to music. Journaling. Talking to a trusted person. Spending time with friends and family. Minimize exposure to UV radiation to reduce your risk of skin cancer. Safety Always wear your seat belt while driving or riding in a vehicle. Do not drive: If you have been drinking alcohol. Do not  ride with someone who has been drinking. When you are tired or distracted. While texting. If you have been using any mind-altering substances or drugs. Wear a helmet and other protective equipment during sports activities. If you have firearms in your house, make sure you follow all gun safety procedures. What's next? Visit your health care provider once a year for an annual wellness visit. Ask your health care provider how often you should have your eyes and teeth checked. Stay up to date on all vaccines. This information is not intended to replace advice given to you by your health care provider. Make sure you discuss any questions you have with your health care provider. Document Revised: 11/28/2020 Document Reviewed: 11/28/2020 Elsevier Patient Education  [place].

## 2022-08-18 NOTE — Assessment & Plan Note (Signed)
**Note De-Identified Slatten Obfuscation** She will come back in 2 months to recheck vitamin D.  She is on vitamin D replacement 50,000 IUs once weekly.

## 2022-08-18 NOTE — Assessment & Plan Note (Signed)
**Note De-Identified Urbani Obfuscation** She is on B12 injection protocol.  She will come back and make couple months to recheck B12.

## 2022-08-18 NOTE — Assessment & Plan Note (Signed)
**Note De-Identified Cale Obfuscation** Last A1c 6.0.  Discussed lifestyle modifications.

## 2022-08-18 NOTE — Assessment & Plan Note (Signed)
**Note De-Identified Garretson Obfuscation** Stable on Celexa 40 mg daily and vaginal estrace.  She does very sparingly use oral estradiol as needed for hot flashes.

## 2022-08-18 NOTE — Progress Notes (Signed)
**Note De-Identified Daffin Obfuscation** Chief Complaint:  Gray Habibi Wildrick is a 72 y.o. female who presents today for her annual comprehensive physical exam.    Assessment/Plan:  New/Acute Problems: Other Fatigue Not much change since her visit a month ago.  She is taking her vitamin D and B12 supplements.  Has referral for sleep study pending.  Chronic Problems Addressed Today: Vitamin D deficiency She will come back in 2 months to recheck vitamin D.  She is on vitamin D replacement 50,000 IUs once weekly.  Hyperglycemia Last A1c 6.0.  Discussed lifestyle modifications.  Vitamin B12 deficiency She is on B12 injection protocol.  She will come back and make couple months to recheck B12.  Allergic rhinitis Worsened due to pollen.  Recommended over-the-counter antihistamine and Pataday drops.  Postmenopausal symptoms Stable on Celexa 40 mg daily and vaginal estrace.  She does very sparingly use oral estradiol as needed for hot flashes.  Preventative Healthcare: She will come back for labs.  Due for mammogram and DEXA.  Due for colonoscopy 2024.  Up-to-date on vaccines.  Patient Counseling(The following topics were reviewed and/or handout was given):  -Nutrition: Stressed importance of moderation in sodium/caffeine intake, saturated fat and cholesterol, caloric balance, sufficient intake of fresh fruits, vegetables, and fiber.  -Stressed the importance of regular exercise.   -Substance Abuse: Discussed cessation/primary prevention of tobacco, alcohol, or other drug use; driving or other dangerous activities under the influence; availability of treatment for abuse.   -Injury prevention: Discussed safety belts, safety helmets, smoke detector, smoking near bedding or upholstery.   -Sexuality: Discussed sexually transmitted diseases, partner selection, use of condoms, avoidance of unintended pregnancy and contraceptive alternatives.   -Dental health: Discussed importance of regular tooth brushing, flossing, and dental visits.   -Health maintenance and immunizations reviewed. Please refer to Health maintenance section.  Return to care in 1 year for next preventative visit.     Subjective:  HPI:  She has no acute complaints today. See a/p for status of chronic conditions. We last saw her about a month ago for fatigue and sleep disordered breathing. She was found to have low B12 and vitamin D and started replacement for both of those.  Was also referred for sleep study however this has not been scheduled as of yet.  Lifestyle Diet: Cutting down on sugar. Trying to get more fruits and vegetables.  Exercise: None specific.      08/18/2022    8:58 AM  Depression screen PHQ 2/9  Decreased Interest 0  Down, Depressed, Hopeless 0  PHQ - 2 Score 0  Altered sleeping 2  Tired, decreased energy 3  Change in appetite 1  Feeling bad or failure about yourself  0  Trouble concentrating 0  Moving slowly or fidgety/restless 0  Suicidal thoughts 0  PHQ-9 Score 6  Difficult doing work/chores Somewhat difficult    Health Maintenance Due  Topic Date Due   DEXA SCAN  Never done   MAMMOGRAM  09/15/2020   COVID-19 Vaccine (5 - 2023-24 season) 02/14/2022     ROS: Per HPI, otherwise a complete review of systems was negative.   PMH:  The following were reviewed and entered/updated in epic: Past Medical History:  Diagnosis Date   Allergy    Atrial flutter ([facility])    typical appearing   Basal cell carcinoma     nose - 1985   GERD (gastroesophageal reflux disease)    H/O cardiac radiofrequency ablation 2015   H/O gastric bypass 2005   History of diabetes **Note De-Identified Hanko Obfuscation** mellitus 11/13/2015   Hypertension    Hypokalemia    Osteoporosis 2000   Paroxysmal atrial fibrillation ([facility])    Personal history of colonic polyps - sessile serrated 11/23/2013   Patient Active Problem List   Diagnosis Date Noted   Vitamin B12 deficiency 08/18/2022   Tremor 08/14/2021   History of atrial fibrillation 08/03/2020   Bilateral dry eyes 03/28/2020    Vitreous membranes and strands, right 03/28/2020   Nuclear sclerotic cataract of right eye 03/28/2020   Nuclear sclerotic cataract of left eye 03/28/2020   Posterior vitreous detachment of left eye 03/28/2020   Posterior vitreous detachment of right eye 03/28/2020   Hyperglycemia 02/18/2019   S/P gastric bypass 07/09/2018   Achilles tendinosis 04/30/2018   Spondylosis of lumbar joint 05/28/2017   Plantar fasciitis, bilateral 05/28/2017   Chronic pain of right wrist 05/28/2017   Haglund's deformity of right heel 02/02/2017   Postmenopausal symptoms 06/02/2016   Arthritis of neck 07/19/2014   GERD (gastroesophageal reflux disease) 07/19/2014   Vitamin D deficiency 07/19/2014   Personal history of colonic polyps - sessile serrated 11/23/2013   Allergic rhinitis 11/11/2011   Past Surgical History:  Procedure Laterality Date   APPENDECTOMY     ATRIAL FLUTTER ABLATION N/A 09/05/2014   Procedure: ATRIAL FLUTTER ABLATION;  Surgeon: Thompson Grayer, MD;  [facility] C[facility];  Service: Cardiovascular;  Laterality: N/A;   BREAST EXCISIONAL BIOPSY Right 01/2016   CARPAL TUNNEL RELEASE Bilateral    FOOT SURGERY Bilateral 2012   shorten bones in both feet   GASTRIC BYPASS     removal of ovary Right 1987   SPINE SURGERY  2000/2012   anterior cervical fusion with plating   TOTAL ABDOMINAL HYSTERECTOMY  1999   WRIST SURGERY Right 2018   Dr. Apolonio Schneiders -ganglion excision    Family History  Problem Relation Age of Onset   Diabetes Mother    Heart disease Mother    Arthritis Mother    Hearing loss Mother    Hyperlipidemia Mother    Hypertension Mother    Kidney disease Mother    Learning disabilities Mother    Miscarriages / [place] Mother    Heart disease Father    Hypertension Father    Alcohol abuse Father    Hyperlipidemia Father    Emphysema Maternal Grandmother    Cancer Maternal Grandmother        throat and lung(smoker)   Esophageal cancer Maternal Grandmother    Heart  disease Brother    Cancer Brother        lung   Colon cancer Maternal Grandfather    Cancer Maternal Grandfather        colon   Scleroderma Sister    Early death Sister        schlederma   Miscarriages / [place] Sister    Stroke Brother    Emphysema Sister    Diabetes Sister    Hyperlipidemia Sister    Hypertension Sister    Mental illness Sister    Miscarriages / [place] Sister    Stroke Sister    Lung cancer Brother        smoker   Diabetes Brother    Diabetes Sister    Pancreatic cancer Neg Hx    Rectal cancer Neg Hx    Stomach cancer Neg Hx     Medications- reviewed and updated Current Outpatient Medications  Medication Sig Dispense Refill   azelastine (ASTELIN) 0.1 % nasal spray Place 1-2 sprays into **Note De-Identified Stillings Obfuscation** both nostrils 2 (two) times daily. Use in each nostril as directed 30 mL 11   citalopram (CELEXA) 40 MG tablet Take 1 tablet (40 mg total) by mouth daily. 90 tablet 3   Cyanocobalamin (VITAMIN B 12 PO) Take 1 tablet by mouth daily.     cyclobenzaprine (FLEXERIL) 5 MG tablet Take 1 tablet (5 mg total) by mouth 3 (three) times daily as needed for muscle spasms. 180 tablet 3   estradiol (ESTRACE) 0.1 MG/GM vaginal cream Place 1 Applicatorful vaginally at bedtime. 42.5 g 11   estradiol (ESTRACE) 0.5 MG tablet Take 1 tablet (0.5 mg total) by mouth daily. 90 tablet 3   gabapentin (NEURONTIN) 300 MG capsule Take 1 capsule (300 mg total) by mouth 3 (three) times daily. 90 capsule 0   Multiple Vitamins-Minerals (MULTIVITAMIN GUMMIES ADULT PO) Take 1 tablet by mouth daily.     propranolol ER (INDERAL LA) 60 MG 24 hr capsule Take 1 capsule (60 mg total) by mouth daily. 90 capsule 3   Vitamin D, Ergocalciferol, (DRISDOL) 1.25 MG (50000 UNIT) CAPS capsule Take 50000IU once weekly. 12 capsule 3   No current facility-administered medications for this visit.    Allergies-reviewed and updated Allergies  Allergen Reactions   Amoxicillin-Pot Clavulanate Nausea Only    Severe  nausea. Couldn't keep medication down  Severe nausea. Couldn't keep medication down   Clarithromycin Hives   Nitrofurantoin Other (See Comments) and Rash    Unknown Palms and hand itching, turned bright red per pt   Sulfonamide Derivatives Other (See Comments)    Abdominal cramping, nausea   Azithromycin Other (See Comments)    Z-pack, doesn't do anything Z-pack, doesn't do anything Z-pack, doesn't do anything Z-pack, doesn't do anything    Nitrofurantoin Macrocrystal Swelling   Sulfa Antibiotics Nausea And Vomiting and Other (See Comments)    Unknown    Social History   Socioeconomic History   Marital status: Married    Spouse name: Not on file   Number of children: 0   Years of education: Not on file   Highest education level: Not on file  Occupational History   Occupation: Actuary: [employer]  Tobacco Use   Smoking status: Never   Smokeless tobacco: Never  Vaping Use   Vaping Use: Never used  Substance and Sexual Activity   Alcohol use: No   Drug use: No   Sexual activity: Never    Birth control/protection: Abstinence  Other Topics Concern   Not on file  Social History Narrative   Married, no children   Engineer, drilling for [employer].   Lives in [place].   1-2 caffeine/day   Social Determinants of Health   Financial Resource Strain: Not on file  Food Insecurity: Not on file  Transportation Needs: Not on file  Physical Activity: Not on file  Stress: Not on file  Social Connections: Not on file        Objective:  Physical Exam: BP 134/80   Pulse 69   Temp (!) 97.1 F (36.2 C) (Temporal)   Ht '5\' 4"'$  (1.626 m)   Wt 189 lb 12.8 oz (86.1 kg)   SpO2 96%   BMI 32.58 kg/m   Body mass index is 32.58 kg/m. Wt Readings from Last 3 Encounters:  08/18/22 189 lb 12.8 oz (86.1 kg)  07/21/22 191 lb 6.4 oz (86.8 kg)  07/14/22 191 lb (86.6 kg)   Gen: NAD, resting comfortably HEENT: TMs normal **Note De-Identified Moccia Obfuscation** bilaterally. OP clear. No thyromegaly noted.  CV: RRR with no murmurs appreciated Pulm: NWOB, CTAB with no crackles, wheezes, or rhonchi GI: Normal bowel sounds present. Soft, Nontender, Nondistended. MSK: no edema, cyanosis, or clubbing noted Skin: warm, dry Neuro: CN2-12 grossly intact. Strength 5/5 in upper and lower extremities. Reflexes symmetric and intact bilaterally.  Psych: Normal affect and thought content     Lakena Sparlin M. Jerline Pain, MD 08/18/2022 9:42 AM

## 2022-08-18 NOTE — Assessment & Plan Note (Signed)
**Note De-Identified Szczepanik Obfuscation** Worsened due to pollen.  Recommended over-the-counter antihistamine and Pataday drops.

## 2022-08-21 ENCOUNTER — Other Ambulatory Visit (INDEPENDENT_AMBULATORY_CARE_PROVIDER_SITE_OTHER): Payer: BC Managed Care – PPO

## 2022-08-21 ENCOUNTER — Ambulatory Visit (INDEPENDENT_AMBULATORY_CARE_PROVIDER_SITE_OTHER): Payer: BC Managed Care – PPO

## 2022-08-21 ENCOUNTER — Other Ambulatory Visit: Payer: Self-pay

## 2022-08-21 DIAGNOSIS — E539 Vitamin B deficiency, unspecified: Secondary | ICD-10-CM

## 2022-08-21 DIAGNOSIS — E538 Deficiency of other specified B group vitamins: Secondary | ICD-10-CM

## 2022-08-21 DIAGNOSIS — E559 Vitamin D deficiency, unspecified: Secondary | ICD-10-CM

## 2022-08-21 MED ORDER — CYANOCOBALAMIN 1000 MCG/ML IJ SOLN
1000.0000 ug | Freq: Once | INTRAMUSCULAR | Status: AC
  Administered 2022-08-25: 1000 ug via INTRAMUSCULAR

## 2022-08-21 NOTE — Progress Notes (Signed)
**Note De-Identified Vangieson Obfuscation** Kathyjo Ingraham 72 yr old female presents to office today for 1st of 3 monthly B12 injections per Dimas Chyle, MD. Administered CYANOCOBALAMIN 1,000 mcg IM left arm. Patient tolerated well.

## 2022-08-23 LAB — VITAMIN B12: Vitamin B-12: 643 pg/mL (ref 211–911)

## 2022-08-25 NOTE — Progress Notes (Signed)
**Note De-Identified Ditommaso Obfuscation** Please inform patient of the following:  Her B12 level is back to normal.  Algis Greenhouse. Jerline Pain, MD 08/25/2022 12:41 PM

## 2022-09-09 ENCOUNTER — Encounter: Payer: BC Managed Care – PPO | Admitting: Physical Therapy

## 2022-09-10 ENCOUNTER — Encounter: Payer: BC Managed Care – PPO | Admitting: Physical Therapy

## 2022-09-18 ENCOUNTER — Ambulatory Visit: Payer: BC Managed Care – PPO

## 2022-10-16 ENCOUNTER — Ambulatory Visit: Payer: BC Managed Care – PPO

## 2022-11-26 ENCOUNTER — Other Ambulatory Visit: Payer: Self-pay | Admitting: Family Medicine

## 2023-02-28 ENCOUNTER — Encounter (HOSPITAL_COMMUNITY): Payer: Self-pay | Admitting: *Deleted

## 2023-02-28 ENCOUNTER — Other Ambulatory Visit: Payer: Self-pay

## 2023-02-28 ENCOUNTER — Ambulatory Visit (HOSPITAL_COMMUNITY)
Admission: EM | Admit: 2023-02-28 | Discharge: 2023-02-28 | Disposition: A | Discharge status: Home / Self Care | Payer: BC Managed Care – PPO | Attending: Emergency Medicine | Admitting: Emergency Medicine

## 2023-02-28 DIAGNOSIS — H81391 Other peripheral vertigo, right ear: Secondary | ICD-10-CM | POA: Insufficient documentation

## 2023-02-28 DIAGNOSIS — H60391 Other infective otitis externa, right ear: Secondary | ICD-10-CM | POA: Diagnosis not present

## 2023-02-28 DIAGNOSIS — J988 Other specified respiratory disorders: Secondary | ICD-10-CM | POA: Diagnosis not present

## 2023-02-28 DIAGNOSIS — R051 Acute cough: Secondary | ICD-10-CM

## 2023-02-28 DIAGNOSIS — U071 COVID-19: Secondary | ICD-10-CM | POA: Insufficient documentation

## 2023-02-28 DIAGNOSIS — B9789 Other viral agents as the cause of diseases classified elsewhere: Secondary | ICD-10-CM | POA: Diagnosis not present

## 2023-02-28 MED ORDER — PROMETHAZINE-DM 6.25-15 MG/5ML PO SYRP: 5.0000 mL | ORAL_SOLUTION | Freq: Every evening | ORAL | 0 refills | Status: DC | PRN

## 2023-02-28 MED ORDER — PREDNISONE 20 MG PO TABS: 40.0000 mg | ORAL_TABLET | Freq: Every day | ORAL | 0 refills | Status: AC

## 2023-02-28 MED ORDER — OFLOXACIN 0.3 % OT SOLN: 10.0000 [drp] | Freq: Every day | OTIC | 0 refills | Status: AC

## 2023-02-28 MED ORDER — BENZONATATE 100 MG PO CAPS: 100.0000 mg | ORAL_CAPSULE | Freq: Three times a day (TID) | ORAL | 0 refills | Status: DC

## 2023-02-28 MED ORDER — ONDANSETRON HCL 4 MG PO TABS: 4.0000 mg | ORAL_TABLET | Freq: Four times a day (QID) | ORAL | 0 refills | Status: DC

## 2023-02-28 MED ORDER — MECLIZINE HCL 12.5 MG PO TABS: 12.5000 mg | ORAL_TABLET | Freq: Three times a day (TID) | ORAL | 0 refills | Status: DC | PRN

## 2023-02-28 NOTE — ED Triage Notes (Addendum)
**Note De-Identified Mcconaha Obfuscation** C/O dizziness this past week without any other sxs last week; but then started 2 days ago with sore throat, bilat earache, body aches, cough, nausea, nasal congestion, runny nose. Denies any known fevers, but c/o some chills. Has been taking Dayquil and IBU. Pt declines acetaminophen dose at this time.

## 2023-02-28 NOTE — Discharge Instructions (Addendum)
**Note De-Identified Mccasland Obfuscation** Please start taking prednisone as prescribed over the next few days. You can take meclizine as needed for dizziness and Zofran as needed for nausea. I have prescribed Tessalon for cough during the day and Promethazine cough syrup to take at night. Otherwise alternate between Ibuprofen and Tylenol as needed for fever and pain. COVID results will return within the next 24 hours. Get plenty of rest and stay hydrated. Return here as needed. If you develop worsening dizziness, blurred vision, server vomiting, or weakness then please seek immediate medical treatment at the ER.

## 2023-02-28 NOTE — ED Provider Notes (Signed)
**Note De-Identified [place] [facility]uscation** [facility]    CSN: [id number] Arrival date & time: 02/28/23  1102      History   Chief Complaint Chief Complaint  Patient presents with   Cough   Nasal Congestion    HPI Felicia Schultz is a 72 y.o. female.   Patient presents with sore throat, ear pain, body aches, cough, nausea, congestion, chills and runny nose x 2 days. Denies any known fevers. Reports she has been taking DayQuil and ibuprofen with some relief.  Patient states she has also had intermittent dizziness over the last week.  Denies headache, blurred vision, weakness, and confusion. Reports prior history of vertigo.   Cough Associated symptoms: chills, ear pain, fever, rhinorrhea and sore throat   Associated symptoms: no chest pain, no headaches and no shortness of breath     Past Medical History:  Diagnosis Date   Allergy    Atrial flutter (HCC)    typical appearing   Basal cell carcinoma     nose - 1985   GERD (gastroesophageal reflux disease)    H/O cardiac radiofrequency ablation 2015   H/O gastric bypass 2005   History of diabetes mellitus 11/13/2015   Hypertension    Hypokalemia    Osteoporosis 2000   Paroxysmal atrial fibrillation (HCC)    Personal history of colonic polyps - sessile serrated 11/23/2013    Patient Active Problem List   Diagnosis Date Noted   Vitamin B12 deficiency 08/18/2022   Tremor 08/14/2021   History of atrial fibrillation 08/03/2020   Bilateral dry eyes 03/28/2020   Vitreous membranes and strands, right 03/28/2020   Nuclear sclerotic cataract of right eye 03/28/2020   Nuclear sclerotic cataract of left eye 03/28/2020   Posterior vitreous detachment of left eye 03/28/2020   Posterior vitreous detachment of right eye 03/28/2020   Hyperglycemia 02/18/2019   S/P gastric bypass 07/09/2018   Achilles tendinosis 04/30/2018   Spondylosis of lumbar joint 05/28/2017   Plantar fasciitis, bilateral 05/28/2017   Chronic pain of right wrist 05/28/2017   Haglund's  deformity of right heel 02/02/2017   Postmenopausal symptoms 06/02/2016   Arthritis of neck 07/19/2014   GERD (gastroesophageal reflux disease) 07/19/2014   Vitamin D deficiency 07/19/2014   Personal history of colonic polyps - sessile serrated 11/23/2013   Allergic rhinitis 11/11/2011    Past Surgical History:  Procedure Laterality Date   APPENDECTOMY     ATRIAL FLUTTER ABLATION N/A 09/05/2014   Procedure: ATRIAL FLUTTER ABLATION;  Surgeon: Hillis Range, MD;  Location: [facility];  Service: Cardiovascular;  Laterality: N/A;   BREAST EXCISIONAL BIOPSY Right 01/2016   CARPAL TUNNEL RELEASE Bilateral    FOOT SURGERY Bilateral 2012   shorten bones in both feet   GASTRIC BYPASS     removal of ovary Right 1987   SPINE SURGERY  2000/2012   anterior cervical fusion with plating   TOTAL ABDOMINAL HYSTERECTOMY  1999   WRIST SURGERY Right 2018   Dr. Orlan Leavens -ganglion excision    OB History   No obstetric history on file.      Home Medications    Prior to Admission medications   Medication Sig Start Date End Date Taking? Authorizing Provider  benzonatate (TESSALON) 100 MG capsule Take 1 capsule (100 mg total) by mouth every 8 (eight) hours. 02/28/23  Yes Susann Givens, Edis Huish A, NP  citalopram (CELEXA) 40 MG tablet Take 1 tablet (40 mg total) by mouth daily. 07/16/21  Yes Ardith Dark, MD  Cyanocobalamin (VITAMIN B **Note De-Identified [place] [facility]uscation** 12 PO) Take 1 tablet by mouth daily.   Yes [provider]  estradiol (ESTRACE) 0.1 MG/GM vaginal cream Place 1 Applicatorful vaginally at bedtime. 07/16/21  Yes Ardith Dark, MD  estradiol (ESTRACE) 0.5 MG tablet Take 1 tablet (0.5 mg total) by mouth daily. 11/27/22  Yes Ardith Dark, MD  meclizine (ANTIVERT) 12.5 MG tablet Take 1 tablet (12.5 mg total) by mouth 3 (three) times daily as needed for dizziness. 02/28/23  Yes Wynonia Lawman A, NP  Multiple Vitamins-Minerals (MULTIVITAMIN GUMMIES ADULT PO) Take 1 tablet by mouth daily.   Yes [provider]   ofloxacin (FLOXIN) 0.3 % OTIC solution Place 10 drops into the right ear daily for 7 days. 02/28/23 03/07/23 Yes Susann Givens, Milburn Freeney A, NP  ondansetron (ZOFRAN) 4 MG tablet Take 1 tablet (4 mg total) by mouth every 6 (six) hours. 02/28/23  Yes Susann Givens, Priyal Musquiz A, NP  predniSONE (DELTASONE) 20 MG tablet Take 2 tablets (40 mg total) by mouth daily for 5 days. 02/28/23 03/05/23 Yes Samyrah Bruster A, NP  promethazine-dextromethorphan (PROMETHAZINE-DM) 6.25-15 MG/5ML syrup Take 5 mLs by mouth at bedtime as needed for cough. 02/28/23  Yes Wynonia Lawman A, NP  propranolol ER (INDERAL LA) 60 MG 24 hr capsule Take 1 capsule (60 mg total) by mouth daily. 08/14/21  Yes Ardith Dark, MD  Vitamin D, Ergocalciferol, (DRISDOL) 1.25 MG (50000 UNIT) CAPS capsule Take 50000IU once weekly. 07/14/22  Yes Lula Olszewski, MD  cyclobenzaprine (FLEXERIL) 5 MG tablet Take 1 tablet (5 mg total) by mouth 3 (three) times daily as needed for muscle spasms. 07/14/22   Lula Olszewski, MD  gabapentin (NEURONTIN) 300 MG capsule Take 1 capsule (300 mg total) by mouth 3 (three) times daily. 07/14/22   Lula Olszewski, MD    Family History Family History  Problem Relation Age of Onset   Diabetes Mother    Heart disease Mother    Arthritis Mother    Hearing loss Mother    Hyperlipidemia Mother    Hypertension Mother    Kidney disease Mother    Learning disabilities Mother    Miscarriages / [place] Mother    Heart disease Father    Hypertension Father    Alcohol abuse Father    Hyperlipidemia Father    Scleroderma Sister    Early death Sister        schlederma   Miscarriages / Stillbirths Sister    Emphysema Sister    Diabetes Sister    Hyperlipidemia Sister    Hypertension Sister    Mental illness Sister    Miscarriages / [place] Sister    Stroke Sister    Diabetes Sister    Heart disease Brother    Cancer Brother        lung   Stroke Brother    Lung cancer Brother        smoker   Diabetes Brother     Emphysema Maternal Grandmother    Cancer Maternal Grandmother        throat and lung(smoker)   Esophageal cancer Maternal Grandmother    Colon cancer Maternal Grandfather    Cancer Maternal Grandfather        colon   Pancreatic cancer Neg Hx    Rectal cancer Neg Hx    Stomach cancer Neg Hx     Social History Social History   Tobacco Use   Smoking status: Never   Smokeless tobacco: Never  Vaping Use   Vaping status: **Note De-Identified [place] [facility]uscation** Never Used  Substance Use Topics   Alcohol use: No   Drug use: No     Allergies   Amoxicillin-pot clavulanate, Clarithromycin, Nitrofurantoin, Sulfonamide derivatives, Azithromycin, Nitrofurantoin macrocrystal, and Sulfa antibiotics   Review of Systems Review of Systems  Constitutional:  Positive for activity change, chills, fatigue and fever.  HENT:  Positive for congestion, ear pain, rhinorrhea and sore throat. Negative for hearing loss and trouble swallowing.   Eyes:  Negative for photophobia and visual disturbance.  Respiratory:  Positive for cough. Negative for chest tightness and shortness of breath.   Cardiovascular:  Negative for chest pain.  Gastrointestinal:  Positive for nausea. Negative for abdominal pain, diarrhea and vomiting.  Musculoskeletal:  Negative for back pain.  Skin:  Negative for color change.  Neurological:  Positive for dizziness and light-headedness. Negative for syncope, weakness and headaches.     Physical Exam Triage Vital Signs ED Triage Vitals  Encounter Vitals Group     BP 02/28/23 1134 134/77     Systolic BP Percentile --      Diastolic BP Percentile --      Pulse Rate 02/28/23 1134 63     Resp 02/28/23 1134 16     Temp 02/28/23 1134 (!) 100.8 F (38.2 C)     Temp Source 02/28/23 1134 Oral     SpO2 02/28/23 1134 94 %     Weight --      Height --      Head Circumference --      Peak Flow --      Pain Score 02/28/23 1133 9     Pain Loc --      Pain Education --      Exclude from Growth Chart --    No data  found.  Updated Vital Signs BP 134/77   Pulse 63   Temp (!) 100.8 F (38.2 C) (Oral)   Resp 16   SpO2 94%   Visual Acuity Right Eye Distance:   Left Eye Distance:   Bilateral Distance:    Right Eye Near:   Left Eye Near:    Bilateral Near:     Physical Exam Vitals and nursing note reviewed.  Constitutional:      General: She is awake. She is not in acute distress.    Appearance: Normal appearance. She is well-developed and well-groomed. She is not ill-appearing, toxic-appearing or diaphoretic.  HENT:     Head: Normocephalic.     Right Ear: Hearing normal. There is impacted cerumen.     Left Ear: Hearing, tympanic membrane, ear canal and external ear normal.     Nose: Congestion and rhinorrhea present.     Right Sinus: No maxillary sinus tenderness or frontal sinus tenderness.     Left Sinus: No maxillary sinus tenderness or frontal sinus tenderness.     Mouth/Throat:     Mouth: Mucous membranes are moist.     Pharynx: Posterior oropharyngeal erythema and postnasal drip present. No pharyngeal swelling or oropharyngeal exudate.     Tonsils: No tonsillar exudate.  Eyes:     General: Lids are normal.     Extraocular Movements: Extraocular movements intact.     Conjunctiva/sclera: Conjunctivae normal.     Pupils: Pupils are equal, round, and reactive to light.  Cardiovascular:     Rate and Rhythm: Normal rate.     Heart sounds: Normal heart sounds.  Pulmonary:     Effort: Pulmonary effort is normal. No respiratory distress. **Note De-Identified [place] [facility]uscation** Breath sounds: Normal breath sounds. No decreased breath sounds or wheezing.  Abdominal:     General: Abdomen is flat. There is no distension.     Palpations: Abdomen is soft.     Tenderness: There is no abdominal tenderness. There is no right CVA tenderness, left CVA tenderness or guarding.  Musculoskeletal:        General: Normal range of motion.     Cervical back: Normal range of motion.  Skin:    General: Skin is warm and dry.   Neurological:     General: No focal deficit present.     Mental Status: She is alert and oriented to person, place, and time.     GCS: GCS eye subscore is 4. GCS verbal subscore is 5. GCS motor subscore is 6.     Cranial Nerves: Cranial nerves 2-12 are intact.     Sensory: Sensation is intact.     Motor: Motor function is intact.     Coordination: Coordination is intact.     Gait: Gait is intact.  Psychiatric:        Attention and Perception: Attention normal.        Mood and Affect: Mood normal.        Speech: Speech normal.        Behavior: Behavior normal. Behavior is cooperative.        Thought Content: Thought content normal.        Cognition and Memory: Cognition normal.        Judgment: Judgment normal.      UC Treatments / Results  Labs (all labs ordered are listed, but only abnormal results are displayed) Labs Reviewed  SARS CORONAVIRUS 2 (TAT 6-24 HRS)    EKG   Radiology No results found.  Procedures Procedures (including critical care time)  Medications Ordered in UC Medications - No data to display  Initial Impression / Assessment and Plan / UC Course  I have reviewed the triage vital signs and the nursing notes.  Pertinent labs & imaging results that were available during my care of the patient were reviewed by me and considered in my medical decision making (see chart for details).     Patient presented with sore throat, ear pain, body aches, cough, nausea, congestion, chills, and runny nose x 2 days.  Denies any known fevers, abdominal pain, vomiting, and diarrhea.  Reports she has been taking DayQuil and ibuprofen with some relief.  Patient states that she also had intermittent dizziness over the last week.  Denies headache, blurred vision, weakness, and confusion.  Previous history of vertigo.  Upon assessment she had mild erythema to throat, and congestion and rhinorrhea.  Right ear was impacted with cerumen. After ear irrigation ear canal was  erythematous and mildly swollen.  Ordered COVID testing per patient request.  Prescribed ofloxacin eardrops for otitis externa. Prescribed meclizine as needed for dizziness, Zofran as needed for nausea, Tessalon and Promethazine DM as needed for cough, and a short course of steroids. Discussed return and emergency department precautions. Final Clinical Impressions(s) / UC Diagnoses   Final diagnoses:  Viral respiratory illness  Vertigo, peripheral, right  Acute cough  Infective otitis externa of right ear     Discharge Instructions      Please start taking prednisone as prescribed over the next few days. You can take meclizine as needed for dizziness and Zofran as needed for nausea. I have prescribed Tessalon for cough during the day and Promethazine cough syrup to **Note De-Identified [place] Obfuscation** take at night. Otherwise alternate between Ibuprofen and Tylenol as needed for fever and pain. COVID results will return within the next 24 hours. Get plenty of rest and stay hydrated. Return here as needed. If you develop worsening dizziness, blurred vision, server vomiting, or weakness then please seek immediate medical treatment at the ER.      ED Prescriptions     Medication Sig Dispense Auth. Provider   meclizine (ANTIVERT) 12.5 MG tablet Take 1 tablet (12.5 mg total) by mouth 3 (three) times daily as needed for dizziness. 10 tablet Wynonia Lawman A, NP   promethazine-dextromethorphan (PROMETHAZINE-DM) 6.25-15 MG/5ML syrup Take 5 mLs by mouth at bedtime as needed for cough. 118 mL Susann Givens, Seve Monette A, NP   benzonatate (TESSALON) 100 MG capsule Take 1 capsule (100 mg total) by mouth every 8 (eight) hours. 21 capsule Susann Givens, Dannon Perlow A, NP   ondansetron (ZOFRAN) 4 MG tablet Take 1 tablet (4 mg total) by mouth every 6 (six) hours. 12 tablet Wynonia Lawman A, NP   predniSONE (DELTASONE) 20 MG tablet Take 2 tablets (40 mg total) by mouth daily for 5 days. 10 tablet Wynonia Lawman A, NP   ofloxacin (FLOXIN) 0.3 % OTIC  solution Place 10 drops into the right ear daily for 7 days. 5 mL Wynonia Lawman A, NP      PDMP not reviewed this encounter.   Wynonia Lawman A, NP 02/28/23 1340

## 2023-03-01 ENCOUNTER — Telehealth: Payer: Self-pay

## 2023-03-01 LAB — SARS CORONAVIRUS 2 (TAT 6-24 HRS): SARS Coronavirus 2: POSITIVE — AB

## 2023-03-01 MED ORDER — PAXLOVID (300/100) 20 X 150 MG & 10 X 100MG PO TBPK: 3.0000 | ORAL_TABLET | Freq: Two times a day (BID) | ORAL | 0 refills | Status: AC

## 2023-03-01 NOTE — Telephone Encounter (Signed)
**Note De-Identified [place] [facility]** Reached pt by phone. Notified of positive result and current CDC recommendations. Pt verbalized understanding. Pt inquired about antivirals. VO by L. Lequita Halt, PA-C for Paxlovid normal dosing.  Reviewed with patient, verified pharmacy, prescription sent.

## 2023-03-11 ENCOUNTER — Encounter: Payer: Self-pay | Admitting: Family Medicine

## 2023-03-11 ENCOUNTER — Ambulatory Visit: Payer: BC Managed Care – PPO | Admitting: Family Medicine

## 2023-03-11 VITALS — BP 145/83 | HR 73 | Temp 97.5°F | Ht 64.0 in | Wt 191.2 lb

## 2023-03-11 DIAGNOSIS — J329 Chronic sinusitis, unspecified: Secondary | ICD-10-CM

## 2023-03-11 DIAGNOSIS — E559 Vitamin D deficiency, unspecified: Secondary | ICD-10-CM

## 2023-03-11 DIAGNOSIS — M545 Low back pain, unspecified: Secondary | ICD-10-CM | POA: Diagnosis not present

## 2023-03-11 DIAGNOSIS — N959 Unspecified menopausal and perimenopausal disorder: Secondary | ICD-10-CM

## 2023-03-11 MED ORDER — CITALOPRAM HYDROBROMIDE 40 MG PO TABS: 40.0000 mg | ORAL_TABLET | Freq: Every day | ORAL | 3 refills | Status: DC

## 2023-03-11 MED ORDER — PROPRANOLOL HCL ER 60 MG PO CP24: 60.0000 mg | ORAL_CAPSULE | Freq: Every day | ORAL | 3 refills | Status: DC

## 2023-03-11 MED ORDER — LEVOFLOXACIN 500 MG PO TABS: 500.0000 mg | ORAL_TABLET | Freq: Every day | ORAL | 0 refills | Status: AC

## 2023-03-11 MED ORDER — ESTRADIOL 0.5 MG PO TABS: 0.5000 mg | ORAL_TABLET | Freq: Every day | ORAL | 3 refills | Status: DC

## 2023-03-11 MED ORDER — ESTRADIOL 0.1 MG/GM VA CREA: 1.0000 | TOPICAL_CREAM | Freq: Every day | VAGINAL | 11 refills | Status: DC

## 2023-03-11 MED ORDER — VITAMIN D (ERGOCALCIFEROL) 1.25 MG (50000 UNIT) PO CAPS: ORAL_CAPSULE | ORAL | 3 refills | Status: DC

## 2023-03-11 MED ORDER — GUAIFENESIN-CODEINE 100-10 MG/5ML PO SOLN
5.0000 mL | Freq: Three times a day (TID) | ORAL | 0 refills | Status: DC | PRN
Start: 2023-03-11 — End: 2023-06-04

## 2023-03-11 NOTE — Progress Notes (Signed)
**Note De-identified [place] [facility]**   **Note De-Identified [place] [facility]bfuscation** Felicia Schultz is a 72 y.o. female who presents today for an office visit.  Assessment/Plan:  New/Acute Problems: Sinusitis No red flags.  Likely secondary infection related to her COVID infection a couple of weeks ago.  She has allergy listed to Augmentin and azithromycin.  She did have a sinus infection a few years ago that was treated with Levaquin and wishes to pursue this again.  She has not had success with doxycycline in the past.  Will send in course of Levaquin 500 mg daily for 7 days.  We discussed potential side effects.  Will also refill her guaifenesin-codeine cough syrup.  Database was reviewed without red flags.  We did discuss potential side effects of her cough syrup including somnolence and constipation.  Encouraged hydration.  She can use over-the-counter meds as well.  We discussed reasons to return to care or seek emergent care.  Chronic Problems Addressed Today: Postmenopausal symptoms She has been out of her meds for the last couple of months.  Will refill her Celexa 40 mg daily, vaginal Estrace, and oral estradiol that she uses very sparingly as needed for hot flashes.  She is tolerating this combination of medications well.  Vitamin D deficiency On vitamin D supplementation.  Will recheck labs next time she comes in for an office visit.  Will refill her prescription for 50,000 IUs once weekly.     Subjective:  HPI:  Patient here today for urgent care follow-up.  She went to urgent care 11 days ago with viral respiratory illness.  Main concern at that time was dizziness consistent with prior vertigo flares. Subsequently started developing other upper respiratory infection (URI) symptoms including cough, sore throat, and malaise. At urgent care was diagnosed with an ear infection. She was given prescriptions for meclizine, Tessalon, ofloxacin, Zofran, prednisone, and promethazine-dextromethorphan cough syrup.  She did later have a COVID test that returned positive and was  subsequently started on Paxlovid. She does think this helped somewhat and was gradually improving for a few days until symptoms worsened again a few days ago.  Over the last several days she still is having a persistent headache. She is taking tylenol with modest improvement. Still has a lot of malaise. Some mild chills. She did have a fever at urgent care. Some chest tightness but no chest pain or shortness of breath. More cough.        Objective:  Physical Exam: BP (!) 145/83   Pulse 73   Temp (!) 97.5 F (36.4 C) (Temporal)   Ht 5\' 4"  (1.626 m)   Wt 191 lb 3.2 oz (86.7 kg)   SpO2 96%   BMI 32.82 kg/m   Gen: No acute distress, resting comfortably HEENT: Left TM with clear effusion.  Right TM clear.  OP erythematous.  Maxillary sinuses with decreased transillumination bilaterally.  Nose mucosa erythematous and boggy bilaterally. CV: Regular rate and rhythm with no murmurs appreciated Pulm: Normal work of breathing, clear to auscultation bilaterally with no crackles, wheezes, or rhonchi Neuro: Grossly normal, moves all extremities Psych: Normal affect and thought content      Carston Riedl M. Jimmey Ralph, MD 03/11/2023 10:52 AM

## 2023-03-11 NOTE — Assessment & Plan Note (Signed)
**Note De-Identified [place] [facility]uscation** She has been out of her meds for the last couple of months.  Will refill her Celexa 40 mg daily, vaginal Estrace, and oral estradiol that she uses very sparingly as needed for hot flashes.  She is tolerating this combination of medications well.

## 2023-03-11 NOTE — Assessment & Plan Note (Signed)
**Note De-Identified [place] [facility]bfuscation** On vitamin D supplementation.  Will recheck labs next time she comes in for an office visit.  Will refill her prescription for 50,000 IUs once weekly.

## 2023-03-11 NOTE — Patient Instructions (Signed)
**Note De-Identified [place] [facility]uscation** It was very nice to see you today!  I think you have a sinus infection.  Please start the Levaquin.  I will refill cough syrup.  Will refill your other medications.  Please make sure that you are getting plenty of fluids.  Let us know if your symptoms does not improve over the next several days.  Return if symptoms worsen or fail to improve.   Take care, Dr Jimmey Ralph  PLEASE NOTE:  If you had any lab tests, please let us know if you have not heard back within a few days. You may see your results on mychart before we have a chance to review them but we will give you a call once they are reviewed by [place].   If we ordered any referrals today, please let us know if you have not heard from their office within the next week.   If you had any urgent prescriptions sent in today, please check with the pharmacy within an hour of our visit to make sure the prescription was transmitted appropriately.   Please try these tips to maintain a healthy lifestyle:  Eat at least 3 REAL meals and 1-2 snacks per day.  Aim for no more than 5 hours between eating.  If you eat breakfast, please do so within one hour of getting up.   Each meal should contain half fruits/vegetables, one quarter protein, and one quarter carbs (no bigger than a computer mouse)  Cut down on sweet beverages. This includes juice, soda, and sweet tea.   Drink at least 1 glass of water with each meal and aim for at least 8 glasses per day  Exercise at least 150 minutes every week.

## 2023-06-03 ENCOUNTER — Encounter (HOSPITAL_BASED_OUTPATIENT_CLINIC_OR_DEPARTMENT_OTHER): Payer: Self-pay | Admitting: Emergency Medicine

## 2023-06-03 ENCOUNTER — Other Ambulatory Visit: Payer: Self-pay

## 2023-06-03 ENCOUNTER — Emergency Department (HOSPITAL_BASED_OUTPATIENT_CLINIC_OR_DEPARTMENT_OTHER)
Admission: EM | Admit: 2023-06-03 | Discharge: 2023-06-03 | Disposition: A | Discharge status: Home / Self Care | Payer: BC Managed Care – PPO | Attending: Emergency Medicine | Admitting: Emergency Medicine

## 2023-06-03 ENCOUNTER — Emergency Department (HOSPITAL_BASED_OUTPATIENT_CLINIC_OR_DEPARTMENT_OTHER): Payer: BC Managed Care – PPO | Admitting: Radiology

## 2023-06-03 DIAGNOSIS — I10 Essential (primary) hypertension: Secondary | ICD-10-CM | POA: Diagnosis not present

## 2023-06-03 DIAGNOSIS — E119 Type 2 diabetes mellitus without complications: Secondary | ICD-10-CM | POA: Insufficient documentation

## 2023-06-03 DIAGNOSIS — Z20822 Contact with and (suspected) exposure to covid-19: Secondary | ICD-10-CM | POA: Insufficient documentation

## 2023-06-03 DIAGNOSIS — R42 Dizziness and giddiness: Secondary | ICD-10-CM | POA: Diagnosis not present

## 2023-06-03 DIAGNOSIS — M791 Myalgia, unspecified site: Secondary | ICD-10-CM | POA: Diagnosis not present

## 2023-06-03 DIAGNOSIS — R52 Pain, unspecified: Secondary | ICD-10-CM | POA: Diagnosis present

## 2023-06-03 LAB — URINALYSIS, ROUTINE W REFLEX MICROSCOPIC
Bilirubin Urine: NEGATIVE
Glucose, UA: NEGATIVE mg/dL
Hgb urine dipstick: NEGATIVE
Ketones, ur: NEGATIVE mg/dL
Leukocytes,Ua: NEGATIVE
Nitrite: NEGATIVE
Specific Gravity, Urine: 1.034 — ABNORMAL HIGH (ref 1.005–1.030)
pH: 5.5 (ref 5.0–8.0)

## 2023-06-03 LAB — BASIC METABOLIC PANEL WITH GFR
Anion gap: 7 (ref 5–15)
BUN: 29 mg/dL — ABNORMAL HIGH (ref 8–23)
CO2: 27 mmol/L (ref 22–32)
Calcium: 9.4 mg/dL (ref 8.9–10.3)
Chloride: 106 mmol/L (ref 98–111)
Creatinine, Ser: 0.7 mg/dL (ref 0.44–1.00)
GFR, Estimated: >60 mL/min
Glucose, Bld: 103 mg/dL — ABNORMAL HIGH (ref 70–99)
Potassium: 3.9 mmol/L (ref 3.5–5.1)
Sodium: 140 mmol/L (ref 135–145)

## 2023-06-03 LAB — CBC WITH DIFFERENTIAL/PLATELET
Abs Immature Granulocytes: 0.01 10*3/uL (ref 0.00–0.07)
Basophils Absolute: 0 10*3/uL (ref 0.0–0.1)
Basophils Relative: 0 %
Eosinophils Absolute: 0.1 10*3/uL (ref 0.0–0.5)
Eosinophils Relative: 1 %
HCT: 39.7 % (ref 36.0–46.0)
Hemoglobin: 13.2 g/dL (ref 12.0–15.0)
Immature Granulocytes: 0 %
Lymphocytes Relative: 24 %
Lymphs Abs: 1.6 10*3/uL (ref 0.7–4.0)
MCH: 31.1 pg (ref 26.0–34.0)
MCHC: 33.2 g/dL (ref 30.0–36.0)
MCV: 93.6 fL (ref 80.0–100.0)
Monocytes Absolute: 0.4 10*3/uL (ref 0.1–1.0)
Monocytes Relative: 6 %
Neutro Abs: 4.5 10*3/uL (ref 1.7–7.7)
Neutrophils Relative %: 69 %
Platelets: 238 10*3/uL (ref 150–400)
RBC: 4.24 MIL/uL (ref 3.87–5.11)
RDW: 13 % (ref 11.5–15.5)
WBC: 6.5 10*3/uL (ref 4.0–10.5)
nRBC: 0 % (ref 0.0–0.2)

## 2023-06-03 LAB — SEDIMENTATION RATE: Sed Rate: 17 mm/h (ref 0–22)

## 2023-06-03 LAB — RESP PANEL BY RT-PCR (RSV, FLU A&B, COVID)  RVPGX2
Influenza A by PCR: NEGATIVE
Influenza B by PCR: NEGATIVE
Resp Syncytial Virus by PCR: NEGATIVE
SARS Coronavirus 2 by RT PCR: NEGATIVE

## 2023-06-03 MED ORDER — PREDNISONE 20 MG PO TABS
40.0000 mg | ORAL_TABLET | Freq: Once | ORAL | Status: AC
  Administered 2023-06-03: 40 mg via ORAL
  Filled 2023-06-03: qty 2

## 2023-06-03 MED ORDER — PREDNISONE 10 MG PO TABS: 40.0000 mg | ORAL_TABLET | Freq: Every day | ORAL | 0 refills | Status: DC

## 2023-06-03 NOTE — Discharge Instructions (Signed)
**Note De-Identified [place] Obfuscation** Would recommend follow-up for the possible connective tissue disorder as explanation for the myalgias.  Take the prednisone as directed for the next 5 days.  Prescription provided.  Also would recommend outpatient MRI for the episodic periods of dizziness has now been going on for several weeks.  Return for any new or worse symptoms.  Work note provided.

## 2023-06-03 NOTE — ED Provider Notes (Addendum)
**Note De-Identified [place] Obfuscation** [place] [facility]MENT AT [facility] Provider Note   CSN: [id number] Arrival date & time: 06/03/23  1428     History  Chief Complaint  Patient presents with   Generalized Body Aches    Felicia Schultz is a 72 y.o. female.  Him with difficulty with generalized body aches can hurts all over for at least a week.  Patient has been seen at urgent care on the 20 16th thought maybe had urinary tract infection at that time urine culture that was negative.  So patient was told to stop antibiotics.  Patient does have a primary care doctor has not seen them for this.  Temp here 98.4 blood pressure 146/92 oxygen sats 99% pulse is 65.  Patient has had similar symptoms in the past and was treated with Toradol without much help.  Patient's past medical history sniffing for gastroesophageal reflux disease history of diabetes hypertension history of gastric bypass.  Patient seen in 2016 for atrial flutter had ablation.  Patient's had her gallbladder removed.  Patient is never used tobacco products.  Patient's also had this repaired by neurosurgery here locally several years ago.  The symptoms are somewhat generalized they are not really isolated to dermatomes or to a cervical or lumbar radiculopathy.  No significant upper respiratory symptoms.  No nausea vomiting or diarrhea.       Home Medications Prior to Admission medications   Medication Sig Start Date End Date Taking? Authorizing Provider  citalopram (CELEXA) 40 MG tablet Take 1 tablet (40 mg total) by mouth daily. 03/11/23   Ardith Dark, MD  Cyanocobalamin (VITAMIN B 12 PO) Take 1 tablet by mouth daily. Patient not taking: Reported on 03/11/2023    [provider]  estradiol (ESTRACE) 0.1 MG/GM vaginal cream Place 1 Applicatorful vaginally at bedtime. 03/11/23   Ardith Dark, MD  estradiol (ESTRACE) 0.5 MG tablet Take 1 tablet (0.5 mg total) by mouth daily. 03/11/23   Ardith Dark, MD  guaiFENesin-codeine 100-10  MG/5ML syrup Take 5 mLs by mouth 3 (three) times daily as needed for cough. 03/11/23   Ardith Dark, MD  Multiple Vitamins-Minerals (MULTIVITAMIN GUMMIES ADULT PO) Take 1 tablet by mouth daily. Patient not taking: Reported on 03/11/2023    [provider]  propranolol ER (INDERAL LA) 60 MG 24 hr capsule Take 1 capsule (60 mg total) by mouth daily. 03/11/23   Ardith Dark, MD  Vitamin D, Ergocalciferol, (DRISDOL) 1.25 MG (50000 UNIT) CAPS capsule Take 50000IU once weekly. 03/11/23   Ardith Dark, MD      Allergies    Amoxicillin-pot clavulanate, Clarithromycin, Nitrofurantoin, Sulfonamide derivatives, Azithromycin, Nitrofurantoin macrocrystal, and Sulfa antibiotics    Review of Systems   Review of Systems  Constitutional:  Negative for chills and fever.  HENT:  Negative for ear pain and sore throat.   Eyes:  Negative for pain and visual disturbance.  Respiratory:  Negative for cough and shortness of breath.   Cardiovascular:  Negative for chest pain and palpitations.  Gastrointestinal:  Negative for abdominal pain and vomiting.  Genitourinary:  Negative for dysuria and hematuria.  Musculoskeletal:  Positive for myalgias. Negative for arthralgias and back pain.  Skin:  Negative for color change and rash.  Neurological:  Positive for dizziness. Negative for seizures and syncope.  All other systems reviewed and are negative.   Physical Exam Updated Vital Signs BP (!) 146/92   Pulse 65   Temp 98.4 F (36.9 C)   Resp **Note De-Identified [place] Obfuscation** 18   SpO2 99%  Physical Exam Vitals and nursing note reviewed.  Constitutional:      General: She is not in acute distress.    Appearance: Normal appearance. She is well-developed.  HENT:     Head: Normocephalic and atraumatic.  Eyes:     Extraocular Movements: Extraocular movements intact.     Conjunctiva/sclera: Conjunctivae normal.     Pupils: Pupils are equal, round, and reactive to light.  Cardiovascular:     Rate and Rhythm: Normal rate and  regular rhythm.     Heart sounds: No murmur heard. Pulmonary:     Effort: Pulmonary effort is normal. No respiratory distress.     Breath sounds: Normal breath sounds.  Abdominal:     Palpations: Abdomen is soft.     Tenderness: There is no abdominal tenderness.  Musculoskeletal:        General: No swelling.     Cervical back: Normal range of motion and neck supple.  Skin:    General: Skin is warm and dry.     Capillary Refill: Capillary refill takes less than 2 seconds.  Neurological:     General: No focal deficit present.     Mental Status: She is alert and oriented to person, place, and time.     Cranial Nerves: No cranial nerve deficit.     Sensory: No sensory deficit.     Motor: No weakness.  Psychiatric:        Mood and Affect: Mood normal.     ED Results / Procedures / Treatments   Labs (all labs ordered are listed, but only abnormal results are displayed) Labs Reviewed  BASIC METABOLIC PANEL - Abnormal; Notable for the following components:      Result Value   Glucose, Bld 103 (*)    BUN 29 (*)    All other components within normal limits  URINALYSIS, ROUTINE W REFLEX MICROSCOPIC - Abnormal; Notable for the following components:   Specific Gravity, Urine 1.034 (*)    Protein, ur TRACE (*)    All other components within normal limits  RESP PANEL BY RT-PCR (RSV, FLU A&B, COVID)  RVPGX2  CBC WITH DIFFERENTIAL/PLATELET  SEDIMENTATION RATE  ANTINUCLEAR ANTIBODIES, IFA  RHEUMATOID FACTOR    EKG None  Radiology No results found.  Procedures Procedures    Medications Ordered in ED Medications - No data to display  ED Course/ Medical Decision Making/ A&P                                 Medical Decision Making Amount and/or Complexity of Data Reviewed Labs: ordered.   Urinalysis negative respiratory panel negative CBC no leukocytosis hemoglobin 13.2 platelets 238 basic metabolic panel renal function normal and LFTs are normal.  Chest x-ray  pending.  Patient may have concerns for connective tissue disorder.  Patient also did relay that she has had intermittent dizziness now for several months.  That has not been completely worked up a thought maybe it was in her ear.  Patient in September did have COVID.  But did not have any of these body aches following that.  Then all for rheumatoid factor ANA sed rate have patient follow back up with primary care doctor.  Will try a course of steroids for 5 days.  Also would recommend MRI since she has had this intermittent dizziness on and off now for several months.  Will see no reason **Note De-Identified [place] Obfuscation** to do head CT here today.   Final Clinical Impression(s) / ED Diagnoses Final diagnoses:  Myalgia  Dizziness    Rx / DC Orders ED Discharge Orders     None         Vanetta Mulders, MD 06/03/23 1731    Vanetta Mulders, MD 06/03/23 1753

## 2023-06-03 NOTE — ED Triage Notes (Signed)
**Note De-Identified [place] Obfuscation** fatigue Generalized body ache sever for last week Pain in tailbone, lower back pain. Neck pain Denies fever Seen at UC Worse in last week Neg urine culture at [facility]

## 2023-06-04 ENCOUNTER — Ambulatory Visit: Payer: BC Managed Care – PPO | Admitting: Family Medicine

## 2023-06-04 ENCOUNTER — Encounter: Payer: Self-pay | Admitting: Family Medicine

## 2023-06-04 ENCOUNTER — Ambulatory Visit: Payer: Self-pay | Admitting: Family Medicine

## 2023-06-04 VITALS — BP 132/84 | HR 74 | Temp 97.8°F | Ht 64.0 in | Wt 192.0 lb

## 2023-06-04 DIAGNOSIS — M542 Cervicalgia: Secondary | ICD-10-CM

## 2023-06-04 DIAGNOSIS — R319 Hematuria, unspecified: Secondary | ICD-10-CM | POA: Diagnosis not present

## 2023-06-04 DIAGNOSIS — M47816 Spondylosis without myelopathy or radiculopathy, lumbar region: Secondary | ICD-10-CM

## 2023-06-04 DIAGNOSIS — Z23 Encounter for immunization: Secondary | ICD-10-CM

## 2023-06-04 DIAGNOSIS — R739 Hyperglycemia, unspecified: Secondary | ICD-10-CM

## 2023-06-04 DIAGNOSIS — M791 Myalgia, unspecified site: Secondary | ICD-10-CM | POA: Diagnosis not present

## 2023-06-04 DIAGNOSIS — R42 Dizziness and giddiness: Secondary | ICD-10-CM

## 2023-06-04 DIAGNOSIS — M47812 Spondylosis without myelopathy or radiculopathy, cervical region: Secondary | ICD-10-CM

## 2023-06-04 LAB — URINALYSIS, ROUTINE W REFLEX MICROSCOPIC
Bilirubin Urine: NEGATIVE
Hgb urine dipstick: NEGATIVE
Leukocytes,Ua: NEGATIVE
Nitrite: NEGATIVE
Specific Gravity, Urine: 1.025 (ref 1.000–1.030)
Total Protein, Urine: NEGATIVE
Urine Glucose: NEGATIVE
Urobilinogen, UA: 0.2 (ref 0.0–1.0)
pH: 6 (ref 5.0–8.0)

## 2023-06-04 LAB — C-REACTIVE PROTEIN: CRP: <1 mg/dL (ref 0.5–20.0)

## 2023-06-04 LAB — CK: Total CK: 52 U/L (ref 7–177)

## 2023-06-04 LAB — VITAMIN B12: Vitamin B-12: 314 pg/mL (ref 211–911)

## 2023-06-04 LAB — VITAMIN D 25 HYDROXY (VIT D DEFICIENCY, FRACTURES): VITD: 20.9 ng/mL — ABNORMAL LOW (ref 30.00–100.00)

## 2023-06-04 LAB — TSH: TSH: 1.59 u[IU]/mL (ref 0.35–5.50)

## 2023-06-04 LAB — HEMOGLOBIN A1C: Hgb A1c MFr Bld: 6.2 % (ref 4.6–6.5)

## 2023-06-04 NOTE — Patient Instructions (Signed)
**Note De-Identified [place] Obfuscation** It was very nice to see you today!  I think your symptoms may be coming from a viral infection.  Please finish your prednisone.  Will check blood work and a urine sample today.  Your EKG today is normal.  I will order an MRI of your brain and cervical spine.  Return if symptoms worsen or fail to improve.   Take care, Dr Jimmey Ralph  PLEASE NOTE:  If you had any lab tests, please let us know if you have not heard back within a few days. You may see your results on mychart before we have a chance to review them but we will give you a call once they are reviewed by [place].   If we ordered any referrals today, please let us know if you have not heard from their office within the next week.   If you had any urgent prescriptions sent in today, please check with the pharmacy within an hour of our visit to make sure the prescription was transmitted appropriately.   Please try these tips to maintain a healthy lifestyle:  Eat at least 3 REAL meals and 1-2 snacks per day.  Aim for no more than 5 hours between eating.  If you eat breakfast, please do so within one hour of getting up.   Each meal should contain half fruits/vegetables, one quarter protein, and one quarter carbs (no bigger than a computer mouse)  Cut down on sweet beverages. This includes juice, soda, and sweet tea.   Drink at least 1 glass of water with each meal and aim for at least 8 glasses per day  Exercise at least 150 minutes every week.

## 2023-06-04 NOTE — Telephone Encounter (Signed)
Patient has OV with PCP today  

## 2023-06-04 NOTE — Assessment & Plan Note (Signed)
**Note De-Identified [place] Obfuscation** This is a likely significant contributor to her above myalgias and back pain.  Will be completing above workup.  If not improving will need to have her follow back up with sports medicine.

## 2023-06-04 NOTE — Telephone Encounter (Signed)
**Note De-identified [place] Obfuscation**  **Note De-Identified [place] Obfuscation** Chief Complaint: dizziness/pain Symptoms: can't stand/walk, sit, or sleep Frequency: constant  Disposition: [] ED /[] Urgent Care (no appt availability in office) / [x] Appointment(In office/virtual)/ []  [facility]/ [] Home Care/ [] Refused Recommended Disposition /[] [facility]/ []  Follow-up with PCP Additional Notes: pt went to urgent care due to tiredness/dizziness and pain over whole body. Dr took UA and determined it was  UTI due to blood in urine and gave abx. Dr called back next day to say it wasn't a UTI and to go to ER to have MRI. Pt didn't realize the hospital didn't have MRI machine until she was assessed. ED dr told her to follow up with PCP to get MRI scheduled bc he is unsure of cause. Appt made with PCP today at 11:20. Husband to drive pt. Pt has hx of bulging disc and wondering if this is cause. Pt is distressed and wants answers.        Copied from CRM [id number]. Topic: Clinical - Red Word Triage >> Jun 04, 2023  9:47 AM Larwance Sachs wrote: Red Word that prompted transfer to Nurse Triage: Patient went to urgent care symptoms showed uti patient was treated and was referred to emergency room, emergency room advised patient to reach out to pcp to schedule a mri regarding dizziness patient is experiencing after disc fusions. Stated dizziness has gone on for several weeks and is causing patient to not be able to sleep, walk or lay down. Also having full body aches/ pains with no appetite and a low grade fever at 99.2 Reason for Disposition  SEVERE dizziness (vertigo) (e.g., unable to walk without assistance)  Answer Assessment - Initial Assessment Questions 1. DESCRIPTION: "Describe your dizziness."     Can't walk, sit, or sleep. 2. VERTIGO: "Do you feel like either you or the room is spinning or tilting?"      Yes. Can't tell if room is spinning 3. LIGHTHEADED: "Do you feel lightheaded?" (e.g., somewhat faint, woozy, weak upon standing)     yes 4. SEVERITY:  "How bad is it?"  "Can you walk?"   - MILD: Feels slightly dizzy and unsteady, but is walking normally.   - MODERATE: Feels unsteady when walking, but not falling; interferes with normal activities (e.g., school, work).   - SEVERE: Unable to walk without falling, or requires assistance to walk without falling.     Severe 5. ONSET:  "When did the dizziness begin?"     September 2024 6. AGGRAVATING FACTORS: "Does anything make it worse?" (e.g., standing, change in head position)     Nothing eases.  7. CAUSE: "What do you think is causing the dizziness?"     Bulging disc, tumor? Not sure 8. RECURRENT SYMPTOM: "Have you had dizziness before?" If Yes, ask: "When was the last time?" "What happened that time?"     Had vertigo, but not the same 9. OTHER SYMPTOMS: "Do you have any other symptoms?" (e.g., headache, weakness, numbness, vomiting, earache)     Headache sporadic, not appetite, fever-low grade 99.2  Protocols used: Dizziness - Vertigo-A-AH

## 2023-06-04 NOTE — Telephone Encounter (Signed)
Please see message. °

## 2023-06-04 NOTE — Progress Notes (Signed)
**Note De-Identified [place] Obfuscation** Felicia Schultz is a 72 y.o. female who presents today for an office visit.  Assessment/Plan:  New/Acute Problems: Dizziness Based on workup in the ED she does appear dehydrated with high urine specific gravity and elevated BUN on labs which is likely contributing though she is also having some vertigo type symptoms as well.  Her neurologic exam today is reassuring however given chronicity of symptoms would be reasonable for [place] to check imaging at this point.  Will obtain brain MRI.  We did discuss reasons to return and seek emergent care.  Depending on results of MRI we will consider referral to ENT or neurology.  Myalgias Unclear etiology.  Workup in ED was negative.  This possible that this may be due to a viral infection.  Her sed rate and other basic labs in the ED were negative.  Will check additional labs today including CRP, CK, TSH, B12, and vitamin D.  She does have rheumatoid factor and ANA pending.  Anticipate that this will improve over the next week or 2.  Her dehydration is likely contributing as well.  She will finish the course of prednisone that was started in the ED.  Depending on above workup and progression of symptoms would consider referral to sports medicine or rheumatology if not improving.  Hematuria No blood on yesterday's UA.  Will recheck again today.  Her urine culture was negative.  If urine microscopy is negative again would not need to pursue any further workup however if positive will need to be referred to urology.  Palpitation Occasional ectopic beat noted on exam today.  This is likely the source of her palpitations.  No signs of A-fib on EKG.  Again this is likely due to her above dehydration.  Recommended hydration.  She will let us know if this continues to be an issue.  Chronic Problems Addressed Today: Hyperglycemia Mildly elevated glucose 103 yesterday in the ED.  Check A1c today.  Spondylosis of lumbar joint This is a likely significant contributor to  her above myalgias and back pain.  Will be completing above workup.  If not improving will need to have her follow back up with sports medicine.  Cervical spondylosis Has seen sports medicine for this in the past.  Do not think that this is likely contributing significantly to her current presentation however could contribute to some of her neck and upper extremity pain.  Will repeat cervical spine MRI today.  As above will need to have a follow-up back up with orthopedics or sports medicine if this continues to be an issue.  We discussed reasons to return to care.     Subjective:  HPI:  See Assessment / plan for status of chronic conditions.  Patient is here today for follow-up.  We last saw her about 3 months ago.  At that time he was dealing with a sinus infection.  We treated this with a course of Levaquin.  This seemed to work well however today reports that she has had intermittent dizziness for the last few months that she initially thought was due to her sinus infection.  Her primary concern today is sudden onset of diffuse body pain.  This started about a week or so ago.  This initially started with low back pain.  She had similar pain about a year ago after falling and bruising her tailbone.  She also had noticed some slight dysuria and blood in urine for the last few weeks in addition.  Her pain progressed **Note De-Identified [place] Obfuscation** to involving diffuse myalgias all over.  She went to urgent care.  They were concerned about UTI and started her on Keflex while culture was pending.  Her culture was negative and she was instructed to stop her antibiotics and go to the emergency room.  She went to the emergency room yesterday.  Had workup there including labs and chest x-ray.  These were normal.  There was concern for connective tissue disorder and patient was started on prednisone burst and given injection of Toradol and discharged home.  She still has ANA and rheumatoid factor pending.  Overall symptoms are about the same  as yesterday.  She is having some weakness in her thighs.  She is also having occasional palpitations.  She also has had continued ongoing dizziness.  This started as above since her URI few months ago.  Symptoms come and go.  Happen about 2-3 times per week.  She will get a sudden sensation like things are spinning and can sometimes feel faint.  No passing out.  No obvious triggering events.  No treatments tried.  She is having slight muscular weakness as above that is generalized.  No focal weakness or numbness.  No vision changes.       Objective:  Physical Exam: BP 132/84   Pulse 74   Temp 97.8 F (36.6 C) (Temporal)   Ht 5\' 4"  (1.626 m)   Wt 192 lb (87.1 kg)   SpO2 97%   BMI 32.96 kg/m   Gen: No acute distress, resting comfortably CV: Regular rate and rhythm with occasional ectopic beat.  No murmurs appreciated Pulm: Normal work of breathing, clear to auscultation bilaterally with no crackles, wheezes, or rhonchi Neuro: Cranial nerves II through XII intact.  Sensation light touch intact throughout. Psych: Normal affect and thought content  EKG: Normal sinus rhythm.  No ischemic changes.  Time Spent: 45 minutes of total time was spent on the date of the encounter performing the following actions: chart review prior to seeing the patient including recent visits with urgent care and the emergency department, obtaining history, performing a medically necessary exam, counseling on the treatment plan, placing orders, and documenting in our EHR.        Katina Degree. Jimmey Ralph, MD 06/04/2023 1:08 PM

## 2023-06-04 NOTE — Assessment & Plan Note (Signed)
**Note De-Identified [place] Obfuscation** Mildly elevated glucose 103 yesterday in the ED.  Check A1c today.

## 2023-06-04 NOTE — Assessment & Plan Note (Signed)
**Note De-Identified [place] Obfuscation** Has seen sports medicine for this in the past.  Do not think that this is likely contributing significantly to her current presentation however could contribute to some of her neck and upper extremity pain.  Will repeat cervical spine MRI today.  As above will need to have a follow-up back up with orthopedics or sports medicine if this continues to be an issue.  We discussed reasons to return to care.

## 2023-06-05 LAB — RHEUMATOID FACTOR: Rheumatoid fact SerPl-aCnc: <10 [IU]/mL (ref <14.0)

## 2023-06-05 NOTE — Progress Notes (Signed)
**Note De-Identified [place] Obfuscation** Vitamin D is low.  This could be contributing to her symptoms.  Recommend starting vitamin D supplement 50,000 IUs once weekly.  Please send in new prescription.  I would like for her to come back and recheck in 3 to 6 months.  Her urine sample does indicate that she is slightly dehydrated.  She also did have microscopic stones in her urine.  It is possible that her symptoms may have come from a kidney stone that has passed.  She did not have any blood in her urine.  If she is still having persistent lower abdominal or back pain she should let us know and we can order a CT scan to check for any lingering kidney stones.  Her blood sugar is borderline elevated but stable to the last few years.  Do not think this is causing her any symptoms however she should continue to work on diet and exercise and we can recheck this in a year.  Her thyroid, B12, and inflammatory markers are all normal.  Her symptoms should be improving especially if she is making sure that she is pushing fluids and staying well-hydrated.  I would like for her to let us know if symptoms are not improving in the next 1 to 2 weeks.  We will contact her once we get results back on her MRI.

## 2023-06-08 ENCOUNTER — Other Ambulatory Visit: Payer: Self-pay | Admitting: *Deleted

## 2023-06-08 DIAGNOSIS — M545 Low back pain, unspecified: Secondary | ICD-10-CM

## 2023-06-08 LAB — ANTINUCLEAR ANTIBODIES, IFA: ANA Ab, IFA: NEGATIVE

## 2023-06-08 MED ORDER — VITAMIN D (ERGOCALCIFEROL) 1.25 MG (50000 UNIT) PO CAPS: ORAL_CAPSULE | ORAL | 3 refills | Status: DC

## 2023-06-09 NOTE — Progress Notes (Signed)
**Note De-Identified [place] Obfuscation** Ok to order NON CONTRAST abdominal CT.

## 2023-06-11 ENCOUNTER — Ambulatory Visit (HOSPITAL_COMMUNITY)
Admission: RE | Admit: 2023-06-11 | Discharge: 2023-06-11 | Disposition: A | Discharge status: Home / Self Care | Payer: BC Managed Care – PPO | Source: Ambulatory Visit | Attending: Family Medicine | Admitting: Family Medicine

## 2023-06-11 ENCOUNTER — Other Ambulatory Visit: Payer: Self-pay | Admitting: *Deleted

## 2023-06-11 DIAGNOSIS — M542 Cervicalgia: Secondary | ICD-10-CM | POA: Insufficient documentation

## 2023-06-11 DIAGNOSIS — R42 Dizziness and giddiness: Secondary | ICD-10-CM | POA: Diagnosis present

## 2023-06-11 DIAGNOSIS — R1030 Lower abdominal pain, unspecified: Secondary | ICD-10-CM

## 2023-06-12 ENCOUNTER — Encounter: Payer: Self-pay | Admitting: Family Medicine

## 2023-06-12 ENCOUNTER — Telehealth: Payer: Self-pay | Admitting: *Deleted

## 2023-06-12 ENCOUNTER — Other Ambulatory Visit: Payer: Self-pay | Admitting: *Deleted

## 2023-06-12 DIAGNOSIS — R1030 Lower abdominal pain, unspecified: Secondary | ICD-10-CM

## 2023-06-12 NOTE — Telephone Encounter (Signed)
See note

## 2023-06-12 NOTE — Telephone Encounter (Signed)
**Note De-Identified [place] Obfuscation** Copied from CRM [id number]. Topic: Clinical - Medical Advice >> Jun 11, 2023  4:25 PM Sim Boast F wrote: Reason for CRM: Pansy from [facility] called regarding an incorrect imaging order - Please call her back at [phone] option #1 then option #4   Spoke wit [facility] tech need new order New order placed

## 2023-06-15 NOTE — Telephone Encounter (Signed)
**Note De-Identified [place] Obfuscation** Spoke with patient, patient will waite for MRI results prior to new work letter

## 2023-06-15 NOTE — Telephone Encounter (Signed)
**Note De-Identified [place] Obfuscation** Call radiology reading room at [phone] for MRI results Waiting for result to be release

## 2023-06-15 NOTE — Telephone Encounter (Signed)
**Note De-Identified [place] Obfuscation** We are still waiting on results of her MRI to come back.  Okay to write another work note for her to go back in a couple weeks.

## 2023-06-16 ENCOUNTER — Other Ambulatory Visit: Payer: Self-pay | Admitting: *Deleted

## 2023-06-16 DIAGNOSIS — R1030 Lower abdominal pain, unspecified: Secondary | ICD-10-CM

## 2023-06-22 ENCOUNTER — Other Ambulatory Visit: Payer: Self-pay

## 2023-06-22 ENCOUNTER — Ambulatory Visit: Payer: Self-pay | Admitting: Family Medicine

## 2023-06-22 ENCOUNTER — Emergency Department (HOSPITAL_COMMUNITY): Payer: 59

## 2023-06-22 ENCOUNTER — Inpatient Hospital Stay (HOSPITAL_COMMUNITY)
Admission: EM | Admit: 2023-06-22 | Discharge: 2023-06-24 | DRG: 378 | Disposition: A | Discharge status: Home / Self Care | Payer: 59 | Attending: Internal Medicine | Admitting: Internal Medicine

## 2023-06-22 DIAGNOSIS — Z79899 Other long term (current) drug therapy: Secondary | ICD-10-CM

## 2023-06-22 DIAGNOSIS — Z8679 Personal history of other diseases of the circulatory system: Secondary | ICD-10-CM

## 2023-06-22 DIAGNOSIS — Z801 Family history of malignant neoplasm of trachea, bronchus and lung: Secondary | ICD-10-CM

## 2023-06-22 DIAGNOSIS — K625 Hemorrhage of anus and rectum: Secondary | ICD-10-CM | POA: Diagnosis not present

## 2023-06-22 DIAGNOSIS — Z881 Allergy status to other antibiotic agents status: Secondary | ICD-10-CM

## 2023-06-22 DIAGNOSIS — Z888 Allergy status to other drugs, medicaments and biological substances status: Secondary | ICD-10-CM

## 2023-06-22 DIAGNOSIS — E1140 Type 2 diabetes mellitus with diabetic neuropathy, unspecified: Secondary | ICD-10-CM | POA: Diagnosis present

## 2023-06-22 DIAGNOSIS — Z822 Family history of deafness and hearing loss: Secondary | ICD-10-CM

## 2023-06-22 DIAGNOSIS — Z800 Family history of malignant neoplasm of digestive organs: Secondary | ICD-10-CM

## 2023-06-22 DIAGNOSIS — Z8261 Family history of arthritis: Secondary | ICD-10-CM

## 2023-06-22 DIAGNOSIS — M810 Age-related osteoporosis without current pathological fracture: Secondary | ICD-10-CM | POA: Diagnosis present

## 2023-06-22 DIAGNOSIS — Z9884 Bariatric surgery status: Secondary | ICD-10-CM

## 2023-06-22 DIAGNOSIS — Z90710 Acquired absence of both cervix and uterus: Secondary | ICD-10-CM

## 2023-06-22 DIAGNOSIS — Z818 Family history of other mental and behavioral disorders: Secondary | ICD-10-CM

## 2023-06-22 DIAGNOSIS — Z823 Family history of stroke: Secondary | ICD-10-CM

## 2023-06-22 DIAGNOSIS — K284 Chronic or unspecified gastrojejunal ulcer with hemorrhage: Secondary | ICD-10-CM | POA: Diagnosis not present

## 2023-06-22 DIAGNOSIS — Z841 Family history of disorders of kidney and ureter: Secondary | ICD-10-CM

## 2023-06-22 DIAGNOSIS — Z880 Allergy status to penicillin: Secondary | ICD-10-CM

## 2023-06-22 DIAGNOSIS — D649 Anemia, unspecified: Secondary | ICD-10-CM

## 2023-06-22 DIAGNOSIS — Z833 Family history of diabetes mellitus: Secondary | ICD-10-CM

## 2023-06-22 DIAGNOSIS — Z980 Intestinal bypass and anastomosis status: Secondary | ICD-10-CM

## 2023-06-22 DIAGNOSIS — Z9049 Acquired absence of other specified parts of digestive tract: Secondary | ICD-10-CM

## 2023-06-22 DIAGNOSIS — Z8249 Family history of ischemic heart disease and other diseases of the circulatory system: Secondary | ICD-10-CM

## 2023-06-22 DIAGNOSIS — Z825 Family history of asthma and other chronic lower respiratory diseases: Secondary | ICD-10-CM

## 2023-06-22 DIAGNOSIS — Z860100 Personal history of colon polyps, unspecified: Secondary | ICD-10-CM

## 2023-06-22 DIAGNOSIS — K922 Gastrointestinal hemorrhage, unspecified: Principal | ICD-10-CM | POA: Diagnosis present

## 2023-06-22 DIAGNOSIS — F32A Depression, unspecified: Secondary | ICD-10-CM | POA: Diagnosis present

## 2023-06-22 DIAGNOSIS — Z83438 Family history of other disorder of lipoprotein metabolism and other lipidemia: Secondary | ICD-10-CM

## 2023-06-22 DIAGNOSIS — Z85828 Personal history of other malignant neoplasm of skin: Secondary | ICD-10-CM

## 2023-06-22 DIAGNOSIS — Z79890 Hormone replacement therapy (postmenopausal): Secondary | ICD-10-CM

## 2023-06-22 DIAGNOSIS — D62 Acute posthemorrhagic anemia: Secondary | ICD-10-CM | POA: Diagnosis present

## 2023-06-22 DIAGNOSIS — Z882 Allergy status to sulfonamides status: Secondary | ICD-10-CM

## 2023-06-22 DIAGNOSIS — I10 Essential (primary) hypertension: Secondary | ICD-10-CM | POA: Diagnosis present

## 2023-06-22 LAB — ABO/RH: ABO/RH(D): O POS

## 2023-06-22 LAB — TYPE AND SCREEN
ABO/RH(D): O POS
Antibody Screen: NEGATIVE

## 2023-06-22 LAB — COMPREHENSIVE METABOLIC PANEL
ALT: 21 U/L (ref 0–44)
AST: 21 U/L (ref 15–41)
Albumin: 3.7 g/dL (ref 3.5–5.0)
Alkaline Phosphatase: 79 U/L (ref 38–126)
Anion gap: 8 (ref 5–15)
BUN: 26 mg/dL — ABNORMAL HIGH (ref 8–23)
CO2: 23 mmol/L (ref 22–32)
Calcium: 8.6 mg/dL — ABNORMAL LOW (ref 8.9–10.3)
Chloride: 106 mmol/L (ref 98–111)
Creatinine, Ser: 0.46 mg/dL (ref 0.44–1.00)
GFR, Estimated: >60 mL/min (ref >60)
Glucose, Bld: 111 mg/dL — ABNORMAL HIGH (ref 70–99)
Potassium: 3.6 mmol/L (ref 3.5–5.1)
Sodium: 137 mmol/L (ref 135–145)
Total Bilirubin: 0.7 mg/dL (ref 0.0–1.2)
Total Protein: 6.7 g/dL (ref 6.5–8.1)

## 2023-06-22 LAB — URINALYSIS, ROUTINE W REFLEX MICROSCOPIC
Bacteria, UA: NONE SEEN
Bilirubin Urine: NEGATIVE
Glucose, UA: NEGATIVE mg/dL
Ketones, ur: 20 mg/dL — AB
Leukocytes,Ua: NEGATIVE
Nitrite: NEGATIVE
Protein, ur: 30 mg/dL — AB
Specific Gravity, Urine: 1.015 (ref 1.005–1.030)
pH: 5 (ref 5.0–8.0)

## 2023-06-22 LAB — CBC WITH DIFFERENTIAL/PLATELET
Abs Immature Granulocytes: 0.01 10*3/uL (ref 0.00–0.07)
Basophils Absolute: 0 10*3/uL (ref 0.0–0.1)
Basophils Relative: 1 %
Eosinophils Absolute: 0.1 10*3/uL (ref 0.0–0.5)
Eosinophils Relative: 1 %
HCT: 32.2 % — ABNORMAL LOW (ref 36.0–46.0)
Hemoglobin: 10.8 g/dL — ABNORMAL LOW (ref 12.0–15.0)
Immature Granulocytes: 0 %
Lymphocytes Relative: 23 %
Lymphs Abs: 1.4 10*3/uL (ref 0.7–4.0)
MCH: 32 pg (ref 26.0–34.0)
MCHC: 33.5 g/dL (ref 30.0–36.0)
MCV: 95.5 fL (ref 80.0–100.0)
Monocytes Absolute: 0.3 10*3/uL (ref 0.1–1.0)
Monocytes Relative: 6 %
Neutro Abs: 4.3 10*3/uL (ref 1.7–7.7)
Neutrophils Relative %: 69 %
Platelets: 190 10*3/uL (ref 150–400)
RBC: 3.37 MIL/uL — ABNORMAL LOW (ref 3.87–5.11)
RDW: 13.2 % (ref 11.5–15.5)
WBC: 6.2 10*3/uL (ref 4.0–10.5)
nRBC: 0 % (ref 0.0–0.2)

## 2023-06-22 LAB — LIPASE, BLOOD: Lipase: 30 U/L (ref 11–51)

## 2023-06-22 LAB — POC OCCULT BLOOD, ED: Fecal Occult Bld: POSITIVE — AB

## 2023-06-22 MED ORDER — PANTOPRAZOLE SODIUM 40 MG IV SOLR
40.0000 mg | Freq: Once | INTRAVENOUS | Status: AC
  Administered 2023-06-22: 40 mg via INTRAVENOUS
  Filled 2023-06-22: qty 10

## 2023-06-22 MED ORDER — IOHEXOL 300 MG/ML  SOLN
100.0000 mL | Freq: Once | INTRAMUSCULAR | Status: AC | PRN
  Administered 2023-06-22: 100 mL via INTRAVENOUS

## 2023-06-22 NOTE — ED Provider Notes (Signed)
**Note De-Identified [place] Obfuscation** [place] [facility] AT [facility] Provider Note   CSN: [id number] Arrival date & time: 06/22/23  1233     History  Chief Complaint  Patient presents with   Rectal Bleeding    Felicia Schultz is a 73 y.o. female.  Patient is a 73 year old female with a history of paroxysmal atrial fibrillation, diabetes, hypertension, prior gastric bypass, ongoing issues with neck and back pain who does not take any NSAIDs, meloxicam  or aspirin  who is presenting today with GI bleeding.  She reports that yesterday she had 2 episodes of black stool with some dark blood clots in it and then again today around noon.  She has felt woozy and lightheaded but denies any syncope, chest pain or shortness of breath.  She does not take any anticoagulants and denies any alcohol use.  She had a colonoscopy by Dr. Avram in 2021 and at that time had a polyp but no other findings and she did not have to return for colonoscopy till 2028.  She denies ever having a history of GI bleeding.  She does have some intermittent abdominal discomfort but no significant pain.    The history is provided by the patient.  Rectal Bleeding      Home Medications Prior to Admission medications   Medication Sig Start Date End Date Taking? Authorizing Provider  citalopram  (CELEXA ) 40 MG tablet Take 1 tablet (40 mg total) by mouth daily. 03/11/23   Parker, Caleb M, MD  Cyanocobalamin  (VITAMIN B 12 PO) Take 1 tablet by mouth daily.    [provider]  estradiol  (ESTRACE ) 0.1 MG/GM vaginal cream Place 1 Applicatorful vaginally at bedtime. 03/11/23   Kennyth Worth HERO, MD  estradiol  (ESTRACE ) 0.5 MG tablet Take 1 tablet (0.5 mg total) by mouth daily. 03/11/23   Kennyth Worth HERO, MD  Multiple Vitamins-Minerals (MULTIVITAMIN GUMMIES ADULT PO) Take 1 tablet by mouth daily.    [provider]  predniSONE  (DELTASONE ) 10 MG tablet Take 4 tablets (40 mg total) by mouth daily. 06/03/23   Zackowski, Scott, MD   propranolol  ER (INDERAL  LA) 60 MG 24 hr capsule Take 1 capsule (60 mg total) by mouth daily. 03/11/23   Kennyth Worth HERO, MD  Vitamin D , Ergocalciferol , (DRISDOL ) 1.25 MG (50000 UNIT) CAPS capsule Take 50000IU once weekly. 06/08/23   Kennyth Worth HERO, MD      Allergies    Amoxicillin -pot clavulanate, Clarithromycin, Nitrofurantoin, Sulfonamide derivatives, Azithromycin , Nitrofurantoin macrocrystal, and Sulfa antibiotics    Review of Systems   Review of Systems  Gastrointestinal:  Positive for hematochezia.    Physical Exam Updated Vital Signs BP (!) 148/80 (BP Location: Left Arm)   Pulse 78   Temp 98.1 F (36.7 C) (Oral)   Resp 16   Ht 5' 4 (1.626 m)   Wt 87.1 kg   SpO2 98%   BMI 32.96 kg/m  Physical Exam Vitals and nursing note reviewed.  Constitutional:      General: She is not in acute distress.    Appearance: She is well-developed.  HENT:     Head: Normocephalic and atraumatic.  Eyes:     Pupils: Pupils are equal, round, and reactive to light.  Cardiovascular:     Rate and Rhythm: Normal rate and regular rhythm.     Pulses: Normal pulses.     Heart sounds: Normal heart sounds. No murmur heard.    No friction rub.  Pulmonary:     Effort: Pulmonary effort is normal. **Note De-Identified [place] Obfuscation** Breath sounds: Normal breath sounds. No wheezing or rales.  Abdominal:     General: Bowel sounds are normal. There is no distension.     Palpations: Abdomen is soft.     Tenderness: There is no abdominal tenderness. There is no guarding or rebound.  Genitourinary:    Rectum: Normal. Guaiac result positive.     Comments: No external hemorrhoids noted.  On Hemoccult stool was black with slight tinge of red Musculoskeletal:        General: No tenderness. Normal range of motion.     Right lower leg: No edema.     Left lower leg: No edema.     Comments: No edema  Skin:    General: Skin is warm and dry.     Findings: No rash.  Neurological:     Mental Status: She is alert and oriented to person,  place, and time. Mental status is at baseline.     Cranial Nerves: No cranial nerve deficit.  Psychiatric:        Behavior: Behavior normal.     ED Results / Procedures / Treatments   Labs (all labs ordered are listed, but only abnormal results are displayed) Labs Reviewed  CBC WITH DIFFERENTIAL/PLATELET - Abnormal; Notable for the following components:      Result Value   RBC 3.37 (*)    Hemoglobin 10.8 (*)    HCT 32.2 (*)    All other components within normal limits  COMPREHENSIVE METABOLIC PANEL - Abnormal; Notable for the following components:   Glucose, Bld 111 (*)    BUN 26 (*)    Calcium  8.6 (*)    All other components within normal limits  URINALYSIS, ROUTINE W REFLEX MICROSCOPIC - Abnormal; Notable for the following components:   Hgb urine dipstick SMALL (*)    Ketones, ur 20 (*)    Protein, ur 30 (*)    All other components within normal limits  POC OCCULT BLOOD, ED - Abnormal; Notable for the following components:   Fecal Occult Bld POSITIVE (*)    All other components within normal limits  LIPASE, BLOOD  OCCULT BLOOD X 1 CARD TO LAB, STOOL  TYPE AND SCREEN  ABO/RH    EKG None  Radiology CT ABDOMEN PELVIS W CONTRAST Result Date: 06/22/2023 CLINICAL DATA:  Nausea for several months. Acute abdominal pain. Melena. EXAM: CT ABDOMEN AND PELVIS WITH CONTRAST TECHNIQUE: Multidetector CT imaging of the abdomen and pelvis was performed using the standard protocol following bolus administration of intravenous contrast. RADIATION DOSE REDUCTION: This exam was performed according to the departmental dose-optimization program which includes automated exposure control, adjustment of the mA and/or kV according to patient size and/or use of iterative reconstruction technique. CONTRAST:  OMNIPAQUE  IOHEXOL  300 MG/ML  SOLN COMPARISON:  05/07/2020 FINDINGS: Lower Chest: No acute findings. Hepatobiliary: No suspicious hepatic masses identified. Mild diffuse hepatic steatosis  noted. Prior cholecystectomy. No evidence of biliary obstruction. Pancreas:  No mass or inflammatory changes. Spleen: Within normal limits in size and appearance. Adrenals/Urinary Tract: No suspicious masses identified. No evidence of ureteral calculi or hydronephrosis. Stomach/Bowel: Prior gastric bypass surgery again noted. No evidence of obstruction, inflammatory process or abnormal fluid collections. Vascular/Lymphatic: No pathologically enlarged lymph nodes. No acute vascular findings. Reproductive: Prior hysterectomy noted. Adnexal regions are unremarkable in appearance. Other:  None. Musculoskeletal:  No suspicious bone lesions identified. IMPRESSION: No acute findings within the abdomen or pelvis. Mild hepatic steatosis. Electronically Signed   By: Norleen DELENA Graham CHRISTELLA.D. **Note De-Identified [place]iewicz Obfuscation** On: 06/22/2023 15:16    Procedures Procedures    Medications Ordered in ED Medications  iohexol  (OMNIPAQUE ) 300 MG/ML solution 100 mL (100 mLs Intravenous Contrast Given 06/22/23 1427)  pantoprazole  (PROTONIX ) injection 40 mg (40 mg Intravenous Given 06/22/23 2223)    ED Course/ Medical Decision Making/ A&P                                 Medical Decision Making Amount and/or Complexity of Data Reviewed Labs: ordered. Decision-making details documented in ED Course. Radiology: ordered and independent interpretation performed. Decision-making details documented in ED Course.  Risk Prescription drug management. Decision regarding hospitalization.   Pt with multiple medical problems and comorbidities and presenting today with a complaint that caries a high risk for morbidity and mortality.  Here today because of new onset of rectal bleeding.  Concern for upper GI bleed given patient did have very black stool has a prior history of gastric bypass and concern for possible ulcers.  However other than taking 1 steroid Dosepak a few weeks ago for ongoing back pain she does not have any other high risk factors for ulcers.  She  does not take any PPIs regularly.  Also is not an alcohol user and does not have focal abdominal pain concerning for acute liver issues, peritonitis.  No obvious external hemorrhoids present.  Also concern for possible lower GI bleed.  I independently interpreted patient's labs and CBC today with a hemoglobin of 10 from 13 2 weeks ago, hemoglobin positive, UA without acute findings and CMP is normal except for mild elevated BUN of 26 and normal lipase.  Patient had a CT scan ordered while she was still in triage. I have independently visualized and interpreted pt's images today.  Abdominal CT without evidence of hydronephrosis, perforation or obstruction.  Radiology reports no acute findings and mild hepatic steatosis.  Spoke with Dr. Leigh with [facility].  Patient received IV Protonix .  She will be admitted for observation and serial hemoglobins.  They will see her in the morning she will be admitted to the hospitalist service overnight.  Patient and her husband are comfortable with this plan.          Final Clinical Impression(s) / ED Diagnoses Final diagnoses:  Acute GI bleeding  Anemia, unspecified type    Rx / DC Orders ED Discharge Orders     None         Doretha Folks, MD 06/22/23 2234

## 2023-06-22 NOTE — ED Triage Notes (Signed)
**Note De-Identified [place] Obfuscation** Pt arrived [place] POV. C/o black tarry stools that began yesterday. Pt also c/o nausea for several months.  Pt Aox4.

## 2023-06-22 NOTE — Telephone Encounter (Signed)
**Note De-Identified [place]ricks Obfuscation** Agree with dispo. Needs to be evaluated ASAP.  Katina Degree. Jimmey Ralph, MD 06/22/2023 9:43 AM

## 2023-06-22 NOTE — ED Provider Triage Note (Signed)
**Note De-Identified [place] Obfuscation** Emergency Medicine Provider Triage Evaluation Note  Felicia Schultz , a 73 y.o. female  was evaluated in triage.  Pt complains of Blood per rectum.  Review of Systems  Positive: Abd pain, nausea, mixed black and red bloody stools Negative: Vomiting, fever  Physical Exam  BP (!) 155/81 (BP Location: Right Arm)   Pulse 82   Temp 97.9 F (36.6 C) (Oral)   Resp 17   Ht 5' 4 (1.626 m)   Wt 87.1 kg   SpO2 98%   BMI 32.96 kg/m  Gen:   Awake, no distress   Resp:  Normal effort  MSK:   Moves extremities without difficulty  Other:  Abd diffusely tender  Medical Decision Making  Medically screening exam initiated at 12:53 PM.  Appropriate orders placed.  Felicia Schultz was informed that the remainder of the evaluation will be completed by another provider, this initial triage assessment does not replace that evaluation, and the importance of remaining in the ED until their evaluation is complete.  Ongoing feeling of unwell for past month, yesterday with abdominal pain and hematochezia/melena. No history of same.    Odell Balls, PA-C 06/22/23 1255

## 2023-06-22 NOTE — Telephone Encounter (Signed)
**Note De-identified Gao Obfuscation**  **Note De-Identified [place] Obfuscation** Chief Complaint: blood in stool Symptoms: blood in stool, dizziness Frequency: 2x- yesterday Pertinent Negatives: Patient denies SOB, nausea or vomiting, blood thinner Disposition: [x] ED /[] Urgent Care (no appt availability in office) / [] Appointment(In office/virtual)/ []  [facility]/ [] Home Care/ [] Refused Recommended Disposition /[] [facility]/ []  Follow-up with PCP Additional Notes: Patient calls stating that she had two stools yesterday, diarrhea, with black, gummy, bloody appearance. States she has had dizziness as well, but this has been ongoing since Sept. Pt reports hx of gastric bypass in 2005. Denies blood thinners, CP, SOB. Per protocol, ED now. Care advice reviewed, pt agreeable to plan. Alerting PCP for review.  Pt is also requesting call back from PCP for return to work note.   Copied from CRM [id number]. Topic: Clinical - Red Word Triage >> Jun 22, 2023  9:08 AM Leila BROCKS wrote: Red Word that prompted transfer to Nurse Triage: Please advise and call at [phone] or [phone]. Patient c/o started bleeding from rectum last night, pain from what she was having all along since seeing Dr. Kennyth, nothing as changed except the bleeding from rectum. Reason for Disposition  Black or tarry bowel movements  (Exception: Chronic-unchanged black-grey BMs AND is taking iron pills or Pepto-Bismol.)  Answer Assessment - Initial Assessment Questions 1. APPEARANCE of BLOOD: What color is it? Is it passed separately, on the surface of the stool, or mixed in with the stool?      Black, bloody, gummy 2. AMOUNT: How much blood was passed?      Liquid, entire stool 3. FREQUENCY: How many times has blood been passed with the stools?      2 times 4. ONSET: When was the blood first seen in the stools? (Days or weeks)      Last night 5. DIARRHEA: Is there also some diarrhea? If Yes, ask: How many diarrhea stools in the past 24 hours?      Liquid- 2 episodes  yesterday 6. CONSTIPATION: Do you have constipation? If Yes, ask: How bad is it?     Denies 7. RECURRENT SYMPTOMS: Have you had blood in your stools before? If Yes, ask: When was the last time? and What happened that time?      Denies 8. BLOOD THINNERS: Do you take any blood thinners? (e.g., Coumadin/warfarin, Pradaxa/dabigatran, aspirin )     Denies 9. OTHER SYMPTOMS: Do you have any other symptoms?  (e.g., abdomen pain, vomiting, dizziness, fever)     Bouts of dizziness  Protocols used: Rectal Bleeding-A-AH

## 2023-06-22 NOTE — Telephone Encounter (Signed)
**Note De-Identified [place] Obfuscation** Please see patient triage notes and advise anything further needed, pt currently at ED

## 2023-06-23 ENCOUNTER — Other Ambulatory Visit: Payer: Self-pay | Admitting: *Deleted

## 2023-06-23 ENCOUNTER — Encounter (HOSPITAL_COMMUNITY)
Admission: EM | Disposition: A | Discharge status: Home / Self Care | Payer: Self-pay | Source: Home / Self Care | Attending: Internal Medicine

## 2023-06-23 ENCOUNTER — Observation Stay (HOSPITAL_COMMUNITY): Payer: 59 | Admitting: Certified Registered"

## 2023-06-23 ENCOUNTER — Ambulatory Visit: Payer: 59 | Admitting: Family Medicine

## 2023-06-23 ENCOUNTER — Encounter (HOSPITAL_COMMUNITY): Payer: Self-pay | Admitting: Internal Medicine

## 2023-06-23 ENCOUNTER — Telehealth: Payer: Self-pay | Admitting: Family Medicine

## 2023-06-23 DIAGNOSIS — D62 Acute posthemorrhagic anemia: Secondary | ICD-10-CM

## 2023-06-23 DIAGNOSIS — Z8261 Family history of arthritis: Secondary | ICD-10-CM | POA: Diagnosis not present

## 2023-06-23 DIAGNOSIS — Z980 Intestinal bypass and anastomosis status: Secondary | ICD-10-CM

## 2023-06-23 DIAGNOSIS — K284 Chronic or unspecified gastrojejunal ulcer with hemorrhage: Principal | ICD-10-CM

## 2023-06-23 DIAGNOSIS — K625 Hemorrhage of anus and rectum: Secondary | ICD-10-CM | POA: Diagnosis present

## 2023-06-23 DIAGNOSIS — R109 Unspecified abdominal pain: Secondary | ICD-10-CM

## 2023-06-23 DIAGNOSIS — M48061 Spinal stenosis, lumbar region without neurogenic claudication: Secondary | ICD-10-CM

## 2023-06-23 DIAGNOSIS — Z881 Allergy status to other antibiotic agents status: Secondary | ICD-10-CM | POA: Diagnosis not present

## 2023-06-23 DIAGNOSIS — Z85828 Personal history of other malignant neoplasm of skin: Secondary | ICD-10-CM | POA: Diagnosis not present

## 2023-06-23 DIAGNOSIS — K2289 Other specified disease of esophagus: Secondary | ICD-10-CM

## 2023-06-23 DIAGNOSIS — Z888 Allergy status to other drugs, medicaments and biological substances status: Secondary | ICD-10-CM | POA: Diagnosis not present

## 2023-06-23 DIAGNOSIS — I4891 Unspecified atrial fibrillation: Secondary | ICD-10-CM | POA: Diagnosis not present

## 2023-06-23 DIAGNOSIS — Z801 Family history of malignant neoplasm of trachea, bronchus and lung: Secondary | ICD-10-CM | POA: Diagnosis not present

## 2023-06-23 DIAGNOSIS — Z934 Other artificial openings of gastrointestinal tract status: Secondary | ICD-10-CM

## 2023-06-23 DIAGNOSIS — Z822 Family history of deafness and hearing loss: Secondary | ICD-10-CM | POA: Diagnosis not present

## 2023-06-23 DIAGNOSIS — Z83438 Family history of other disorder of lipoprotein metabolism and other lipidemia: Secondary | ICD-10-CM | POA: Diagnosis not present

## 2023-06-23 DIAGNOSIS — Z860100 Personal history of colon polyps, unspecified: Secondary | ICD-10-CM | POA: Diagnosis not present

## 2023-06-23 DIAGNOSIS — K921 Melena: Secondary | ICD-10-CM

## 2023-06-23 DIAGNOSIS — Z833 Family history of diabetes mellitus: Secondary | ICD-10-CM | POA: Diagnosis not present

## 2023-06-23 DIAGNOSIS — Z8249 Family history of ischemic heart disease and other diseases of the circulatory system: Secondary | ICD-10-CM | POA: Diagnosis not present

## 2023-06-23 DIAGNOSIS — K922 Gastrointestinal hemorrhage, unspecified: Secondary | ICD-10-CM | POA: Diagnosis not present

## 2023-06-23 DIAGNOSIS — M810 Age-related osteoporosis without current pathological fracture: Secondary | ICD-10-CM | POA: Diagnosis present

## 2023-06-23 DIAGNOSIS — K289 Gastrojejunal ulcer, unspecified as acute or chronic, without hemorrhage or perforation: Secondary | ICD-10-CM | POA: Diagnosis not present

## 2023-06-23 DIAGNOSIS — F32A Depression, unspecified: Secondary | ICD-10-CM | POA: Diagnosis present

## 2023-06-23 DIAGNOSIS — Z9884 Bariatric surgery status: Secondary | ICD-10-CM | POA: Diagnosis not present

## 2023-06-23 DIAGNOSIS — Z882 Allergy status to sulfonamides status: Secondary | ICD-10-CM | POA: Diagnosis not present

## 2023-06-23 DIAGNOSIS — Z823 Family history of stroke: Secondary | ICD-10-CM | POA: Diagnosis not present

## 2023-06-23 DIAGNOSIS — Z825 Family history of asthma and other chronic lower respiratory diseases: Secondary | ICD-10-CM | POA: Diagnosis not present

## 2023-06-23 DIAGNOSIS — Z880 Allergy status to penicillin: Secondary | ICD-10-CM | POA: Diagnosis not present

## 2023-06-23 DIAGNOSIS — R7989 Other specified abnormal findings of blood chemistry: Secondary | ICD-10-CM | POA: Diagnosis not present

## 2023-06-23 DIAGNOSIS — Z79899 Other long term (current) drug therapy: Secondary | ICD-10-CM | POA: Diagnosis not present

## 2023-06-23 DIAGNOSIS — Z9049 Acquired absence of other specified parts of digestive tract: Secondary | ICD-10-CM | POA: Diagnosis not present

## 2023-06-23 DIAGNOSIS — I10 Essential (primary) hypertension: Secondary | ICD-10-CM | POA: Diagnosis present

## 2023-06-23 DIAGNOSIS — K219 Gastro-esophageal reflux disease without esophagitis: Secondary | ICD-10-CM

## 2023-06-23 DIAGNOSIS — E1140 Type 2 diabetes mellitus with diabetic neuropathy, unspecified: Secondary | ICD-10-CM | POA: Diagnosis present

## 2023-06-23 DIAGNOSIS — Z79890 Hormone replacement therapy (postmenopausal): Secondary | ICD-10-CM | POA: Diagnosis not present

## 2023-06-23 HISTORY — PX: ENTEROSCOPY: SHX5533

## 2023-06-23 HISTORY — PX: SUBMUCOSAL TATTOO INJECTION: SHX6856

## 2023-06-23 HISTORY — PX: BIOPSY: SHX5522

## 2023-06-23 LAB — HEMOGLOBIN AND HEMATOCRIT, BLOOD
HCT: 29.9 % — ABNORMAL LOW (ref 36.0–46.0)
HCT: 28.8 % — ABNORMAL LOW (ref 36.0–46.0)
Hemoglobin: 9.9 g/dL — ABNORMAL LOW (ref 12.0–15.0)
Hemoglobin: 9.7 g/dL — ABNORMAL LOW (ref 12.0–15.0)

## 2023-06-23 LAB — HIV ANTIBODY (ROUTINE TESTING W REFLEX): HIV Screen 4th Generation wRfx: NONREACTIVE

## 2023-06-23 SURGERY — ENTEROSCOPY
Anesthesia: Monitor Anesthesia Care

## 2023-06-23 MED ORDER — ACETAMINOPHEN 650 MG RE SUPP
650.0000 mg | Freq: Four times a day (QID) | RECTAL | Status: DC | PRN
Start: 2023-06-23 — End: 2023-06-24

## 2023-06-23 MED ORDER — PANTOPRAZOLE SODIUM 40 MG IV SOLR
40.0000 mg | Freq: Two times a day (BID) | INTRAVENOUS | Status: DC
  Administered 2023-06-23 (×2): 40 mg via INTRAVENOUS
  Filled 2023-06-23 (×2): qty 10

## 2023-06-23 MED ORDER — ACETAMINOPHEN 325 MG PO TABS
650.0000 mg | ORAL_TABLET | Freq: Four times a day (QID) | ORAL | Status: DC | PRN
  Administered 2023-06-23: 650 mg via ORAL
  Filled 2023-06-23: qty 2

## 2023-06-23 MED ORDER — ONDANSETRON HCL 4 MG/2ML IJ SOLN: 4.0000 mg | Freq: Four times a day (QID) | INTRAMUSCULAR | Status: DC | PRN

## 2023-06-23 MED ORDER — SODIUM CHLORIDE 0.9 % IV SOLN: INTRAVENOUS | Status: DC

## 2023-06-23 MED ORDER — PROPOFOL 500 MG/50ML IV EMUL
INTRAVENOUS | Status: AC
  Filled 2023-06-23: qty 50

## 2023-06-23 MED ORDER — SUCRALFATE 1 GM/10ML PO SUSP
1.0000 g | Freq: Three times a day (TID) | ORAL | Status: DC
  Administered 2023-06-23 – 2023-06-24 (×4): 1 g via ORAL
  Filled 2023-06-23 (×4): qty 10

## 2023-06-23 MED ORDER — CYCLOBENZAPRINE HCL 10 MG PO TABS
10.0000 mg | ORAL_TABLET | Freq: Three times a day (TID) | ORAL | Status: DC | PRN
  Administered 2023-06-23: 10 mg via ORAL
  Filled 2023-06-23: qty 1

## 2023-06-23 MED ORDER — GABAPENTIN 300 MG PO CAPS
300.0000 mg | ORAL_CAPSULE | Freq: Three times a day (TID) | ORAL | Status: DC | PRN
Start: 2023-06-23 — End: 2023-06-24
  Administered 2023-06-23: 300 mg via ORAL
  Filled 2023-06-23: qty 1

## 2023-06-23 MED ORDER — ESTRADIOL 0.5 MG PO TABS
0.5000 mg | ORAL_TABLET | Freq: Every day | ORAL | Status: DC
  Administered 2023-06-23: 0.5 mg via ORAL
  Filled 2023-06-23 (×2): qty 1

## 2023-06-23 MED ORDER — LIDOCAINE 2% (20 MG/ML) 5 ML SYRINGE
INTRAMUSCULAR | Status: DC | PRN
  Administered 2023-06-23: 80 mg via INTRAVENOUS

## 2023-06-23 MED ORDER — CITALOPRAM HYDROBROMIDE 10 MG PO TABS
40.0000 mg | ORAL_TABLET | Freq: Every day | ORAL | Status: DC
  Administered 2023-06-23 – 2023-06-24 (×2): 40 mg via ORAL
  Filled 2023-06-23 (×2): qty 4

## 2023-06-23 MED ORDER — ONDANSETRON HCL 4 MG PO TABS: 4.0000 mg | ORAL_TABLET | Freq: Four times a day (QID) | ORAL | Status: DC | PRN

## 2023-06-23 MED ORDER — TRAZODONE HCL 50 MG PO TABS: 25.0000 mg | ORAL_TABLET | Freq: Every evening | ORAL | Status: DC | PRN

## 2023-06-23 MED ORDER — PROPOFOL 10 MG/ML IV BOLUS
INTRAVENOUS | Status: DC | PRN
  Administered 2023-06-23 (×2): 20 mg via INTRAVENOUS
  Administered 2023-06-23: 30 mg via INTRAVENOUS

## 2023-06-23 MED ORDER — PROPOFOL 500 MG/50ML IV EMUL
INTRAVENOUS | Status: DC | PRN
  Administered 2023-06-23: 125 ug/kg/min via INTRAVENOUS

## 2023-06-23 MED ORDER — ALBUTEROL SULFATE (2.5 MG/3ML) 0.083% IN NEBU: 2.5000 mg | INHALATION_SOLUTION | RESPIRATORY_TRACT | Status: DC | PRN

## 2023-06-23 MED ORDER — SPOT INK MARKER SYRINGE KIT
PACK | SUBMUCOSAL | Status: DC | PRN
  Administered 2023-06-23: 2 mL via SUBMUCOSAL

## 2023-06-23 SURGICAL SUPPLY — 14 items

## 2023-06-23 NOTE — ED Notes (Signed)
**Note De-Identified [place]itt Obfuscation** Pt being transported to endo and will be returning to ED.

## 2023-06-23 NOTE — H&P (Signed)
**Note De-Identified Pina Obfuscation** History and Physical  Felicia Schultz FMW:[id number] DOB: 15-Jul-1950 DOA: 06/22/2023  PCP: Kennyth Worth HERO, MD   Chief Complaint: Dark tarry stools  HPI: Felicia Schultz is a 73 y.o. female with medical history significant for GERD, hypertension, paroxysmal atrial fibrillation not on anticoagulation being admitted to the hospital with suspicion of blood loss anemia due to upper GI bleed.  Patient was in her usual state of health until approximately December 18 when she started having vague abdominal discomfort.  That day, she was also having bodyaches and neck pain, was seen by her PCP and started on 5 days of prednisone  40 mg p.o. daily.  For back/neck pain did get a little bit better, she continued to have some vague abdominal discomfort, but no associated fevers, chills, nausea, vomiting.  2 nights ago, she had a dark tarry stool, happened again yesterday around noon.  Denies any bright red blood, just black and tarry.  Came to the ER for evaluation, where she was found to have mild anemia, hemodynamically stable.  She was given IV Protonix , and gastroenterology was consulted.  Review of Systems: Please see HPI for pertinent positives and negatives. A complete 10 system review of systems are otherwise negative.  Past Medical History:  Diagnosis Date   Allergy     Atrial flutter (HCC)    typical appearing   Basal cell carcinoma     nose - 1985   GERD (gastroesophageal reflux disease)    H/O cardiac radiofrequency ablation 2015   H/O gastric bypass 2005   History of diabetes mellitus 11/13/2015   Hypertension    Hypokalemia    Osteoporosis 2000   Paroxysmal atrial fibrillation (HCC)    Personal history of colonic polyps - sessile serrated 11/23/2013   Past Surgical History:  Procedure Laterality Date   APPENDECTOMY     ATRIAL FLUTTER ABLATION N/A 09/05/2014   Procedure: ATRIAL FLUTTER ABLATION;  Surgeon: Lynwood Rakers, MD;  Location: [facility] CATH LAB;  Service: Cardiovascular;  Laterality: N/A;    BREAST EXCISIONAL BIOPSY Right 01/2016   CARPAL TUNNEL RELEASE Bilateral    CHOLECYSTECTOMY     FOOT SURGERY Bilateral 2012   shorten bones in both feet   GASTRIC BYPASS     removal of ovary Right 1987   SPINE SURGERY  2000/2012   anterior cervical fusion with plating   TOTAL ABDOMINAL HYSTERECTOMY  1999   WRIST SURGERY Right 2018   Dr. Ahmad -ganglion excision   Social History:  reports that she has never smoked. She has never used smokeless tobacco. She reports that she does not drink alcohol and does not use drugs.  Allergies  Allergen Reactions   Amoxicillin -Pot Clavulanate Nausea Only    Severe nausea. Couldn't keep medication down  Severe nausea. Couldn't keep medication down   Clarithromycin Hives   Nitrofurantoin Other (See Comments) and Rash    Unknown Palms and hand itching, turned bright red per pt   Sulfonamide Derivatives Other (See Comments)    Abdominal cramping, nausea   Azithromycin  Other (See Comments)    Z-pack, doesn't do anything Z-pack, doesn't do anything Z-pack, doesn't do anything Z-pack, doesn't do anything    Nitrofurantoin Macrocrystal Swelling   Sulfa Antibiotics Nausea And Vomiting and Other (See Comments)    Unknown    Family History  Problem Relation Age of Onset   Diabetes Mother    Heart disease Mother    Arthritis Mother    Hearing loss Mother    Hyperlipidemia Mother **Note De-Identified [place] Obfuscation** Hypertension Mother    Kidney disease Mother    Learning disabilities Mother    Miscarriages / Stillbirths Mother    Heart disease Father    Hypertension Father    Alcohol abuse Father    Hyperlipidemia Father    Scleroderma Sister    Early death Sister        schlederma   Miscarriages / Stillbirths Sister    Emphysema Sister    Diabetes Sister    Hyperlipidemia Sister    Hypertension Sister    Mental illness Sister    Miscarriages / Stillbirths Sister    Stroke Sister    Diabetes Sister    Heart disease Brother    Cancer Brother        lung    Stroke Brother    Lung cancer Brother        smoker   Diabetes Brother    Emphysema Maternal Grandmother    Cancer Maternal Grandmother        throat and lung(smoker)   Esophageal cancer Maternal Grandmother    Colon cancer Maternal Grandfather    Cancer Maternal Grandfather        colon   Pancreatic cancer Neg Hx    Rectal cancer Neg Hx    Stomach cancer Neg Hx      Prior to Admission medications   Medication Sig Start Date End Date Taking? Authorizing Provider  citalopram  (CELEXA ) 40 MG tablet Take 1 tablet (40 mg total) by mouth daily. 03/11/23  Yes Kennyth Worth HERO, MD  Cyanocobalamin  (VITAMIN B 12 PO) Take 1 tablet by mouth daily.   Yes [provider]  cyclobenzaprine  (FLEXERIL ) 10 MG tablet Take 10 mg by mouth 3 (three) times daily as needed for muscle spasms.   Yes [provider]  estradiol  (ESTRACE ) 0.1 MG/GM vaginal cream Place 1 Applicatorful vaginally at bedtime. 03/11/23  Yes Kennyth Worth HERO, MD  estradiol  (ESTRACE ) 0.5 MG tablet Take 1 tablet (0.5 mg total) by mouth daily. 03/11/23  Yes Kennyth Worth HERO, MD  gabapentin  (NEURONTIN ) 300 MG capsule Take 300 mg by mouth 3 (three) times daily as needed.   Yes [provider]  Multiple Vitamins-Minerals (MULTIVITAMIN GUMMIES ADULT PO) Take 1 tablet by mouth daily.   Yes [provider]  propranolol  ER (INDERAL  LA) 60 MG 24 hr capsule Take 1 capsule (60 mg total) by mouth daily. 03/11/23  Yes Kennyth Worth HERO, MD  Vitamin D , Ergocalciferol , (DRISDOL ) 1.25 MG (50000 UNIT) CAPS capsule Take 50000IU once weekly. 06/08/23  Yes Kennyth Worth HERO, MD  predniSONE  (DELTASONE ) 10 MG tablet Take 4 tablets (40 mg total) by mouth daily. Patient not taking: Reported on 06/23/2023 06/03/23   Zackowski, Scott, MD    Physical Exam: BP 126/73   Pulse 84   Temp 98 F (36.7 C)   Resp 17   Ht 5' 4 (1.626 m)   Wt 87.1 kg   SpO2 94%   BMI 32.96 kg/m  General:  Alert, oriented, calm, in no acute distress, her  husband is at the bedside Cardiovascular: RRR, no murmurs or rubs, no peripheral edema  Respiratory: clear to auscultation bilaterally, no wheezes, no crackles  Abdomen: soft, nontender, nondistended, normal bowel tones heard  Skin: dry, no rashes  Musculoskeletal: no joint effusions, normal range of motion  Psychiatric: appropriate affect, normal speech  Neurologic: extraocular muscles intact, clear speech, moving all extremities with intact sensorium         Labs on Admission: **Note De-Identified [place] Obfuscation** Basic Metabolic Panel: Recent Labs  Lab 06/22/23 1340  NA 137  K 3.6  CL 106  CO2 23  GLUCOSE 111*  BUN 26*  CREATININE 0.46  CALCIUM  8.6*   Liver Function Tests: Recent Labs  Lab 06/22/23 1340  AST 21  ALT 21  ALKPHOS 79  BILITOT 0.7  PROT 6.7  ALBUMIN  3.7   Recent Labs  Lab 06/22/23 1340  LIPASE 30   No results for input(s): AMMONIA in the last 168 hours. CBC: Recent Labs  Lab 06/22/23 1340 06/23/23 0454  WBC 6.2  --   NEUTROABS 4.3  --   HGB 10.8* 9.9*  HCT 32.2* 29.9*  MCV 95.5  --   PLT 190  --    Cardiac Enzymes: No results for input(s): CKTOTAL, CKMB, CKMBINDEX, TROPONINI in the last 168 hours. BNP (last 3 results) No results for input(s): BNP in the last 8760 hours.  ProBNP (last 3 results) No results for input(s): PROBNP in the last 8760 hours.  CBG: No results for input(s): GLUCAP in the last 168 hours.  Radiological Exams on Admission: CT ABDOMEN PELVIS W CONTRAST Result Date: 06/22/2023 CLINICAL DATA:  Nausea for several months. Acute abdominal pain. Melena. EXAM: CT ABDOMEN AND PELVIS WITH CONTRAST TECHNIQUE: Multidetector CT imaging of the abdomen and pelvis was performed using the standard protocol following bolus administration of intravenous contrast. RADIATION DOSE REDUCTION: This exam was performed according to the departmental dose-optimization program which includes automated exposure control, adjustment of the mA and/or kV according to  patient size and/or use of iterative reconstruction technique. CONTRAST:  OMNIPAQUE  IOHEXOL  300 MG/ML  SOLN COMPARISON:  05/07/2020 FINDINGS: Lower Chest: No acute findings. Hepatobiliary: No suspicious hepatic masses identified. Mild diffuse hepatic steatosis noted. Prior cholecystectomy. No evidence of biliary obstruction. Pancreas:  No mass or inflammatory changes. Spleen: Within normal limits in size and appearance. Adrenals/Urinary Tract: No suspicious masses identified. No evidence of ureteral calculi or hydronephrosis. Stomach/Bowel: Prior gastric bypass surgery again noted. No evidence of obstruction, inflammatory process or abnormal fluid collections. Vascular/Lymphatic: No pathologically enlarged lymph nodes. No acute vascular findings. Reproductive: Prior hysterectomy noted. Adnexal regions are unremarkable in appearance. Other:  None. Musculoskeletal:  No suspicious bone lesions identified. IMPRESSION: No acute findings within the abdomen or pelvis. Mild hepatic steatosis. Electronically Signed   By: Norleen DELENA Kil M.D.   On: 06/22/2023 15:16   Assessment/Plan Felicia Schultz is a 73 y.o. female with medical history significant for GERD, hypertension, paroxysmal atrial fibrillation not on anticoagulation being admitted to the hospital with suspicion of blood loss anemia due to upper GI bleed.   Acute blood loss anemia-with abdominal discomfort, melanotic stools.  Baseline hemoglobin is normal. -Observation admission -Continuous telemetry -Avoid blood thinners -Will transfuse if needed to keep hemoglobin greater than 7  Upper GI bleed-suspected due to anemia, melanotic stools.  Patient is not on any blood thinners, has no prior history of GI bleeding.  She did recently take a course of prednisone  for 5 days in the middle of December and has a history of gastric bypass in 2005 -N.p.o. except for ice chips and meds -GI Dr. Leigh was consulted overnight -Trend every 8 hour hemoglobin -IV  PPI twice daily  Hypertension-continue propranolol   Depression-Celexa   Cervical neuropathy-gabapentin /Flexeril  as needed  DVT prophylaxis: Lovenox     Code Status: Full Code  Consults called: Gastroenterology  Admission status: Observation  Time spent: 49 minutes  Felicia Arkin HERO Gail MD [facility] Pager [phone]  If 7PM-7AM, **Note De-Identified [place] Obfuscation** please contact night-coverage [email or web address] Password [email or web address]  06/23/2023, 7:26 AM

## 2023-06-23 NOTE — Transfer of Care (Signed)
**Note De-Identified Zupko Obfuscation** Immediate Anesthesia Transfer of Care Note  Patient: Felicia Schultz  Procedure(s) Performed: ENTEROSCOPY SUBMUCOSAL TATTOO INJECTION BIOPSY  Patient Location: PACU  Anesthesia Type:MAC  Level of Consciousness: awake, alert , and oriented  Airway & Oxygen Therapy: Patient Spontanous Breathing and Patient connected to face mask oxygen  Post-op Assessment: Report given to RN and Post -op Vital signs reviewed and stable  Post vital signs: Reviewed and stable  Last Vitals:  Vitals Value Taken Time  BP 94/66 06/23/23 1144  Temp 36.8 C 06/23/23 1144  Pulse 85 06/23/23 1147  Resp 22 06/23/23 1147  SpO2 97 % 06/23/23 1147  Vitals shown include unfiled device data.  Last Pain:  Vitals:   06/23/23 1144  TempSrc: Temporal  PainSc: 0-No pain         Complications: No notable events documented.

## 2023-06-23 NOTE — Consult Note (Signed)
**Note De-Identified [place]ilin[place] Obfuscation** Consultation  Referring Provider:   Dr. Zella Primary Care Physician:  Kennyth Worth HERO, MD Primary Gastroenterologist: Dr. Avram        Reason for Consultation: Melena with acute anemia             HPI:   Felicia Schultz is a 73 y.o. female with a past medical history as listed below including reflux, hypertension, paroxysmal A-fib not on anticoagulation, who was admitted to the hospital in 06/22/2023 for complaint of dark tarry stools.    At time of admission patient described that she was doing well until approximately 06/03/2023 when she started having vague abdominal discomfort, also started with bodyaches and neck pain.  Apparently went to see her PCP and was started on 5 days of Prednisone  40 mg daily.  Her neck and back pain got better but she continued to have vague abdominal discomfort.  Then on the night of 06/20/2023 she had a dark tarry stool, which happened again on 06/21/2023 around noon.  She came to the ER for further evaluation was found to have a mild anemia.    At time of my interview patient confirms a story above.  Apparently was doing well until 12/18 and she started with neck and back pain which felt similar to prior issues with her tailbone and went to the urgent care and was given Prednisone  40 mg from 12/19 through 12/23.  Continued with vague abdominal pain and continues to feel like she will faint with no energy.  2 days ago 06/21/2023 had 2 bowel movements which were both black and tarry in coloration and then yesterday morning had a further bowel movement.  She has not had bowel movement since then.    Denies fever, chills, weight loss, nausea, vomiting or NSAID use.  ER course: BUN 26, lipase normal, hemoglobin 10.8--> 9.9 overnight (normal at 13.2 2 weeks ago), normal platelets; CT abdomen pelvis with contrast showed no acute findings, mild hepatic steatosis  GI history: 05/17/2020 colonoscopy with one 1 mm polyp in the cecum, otherwise normal; pathology showed  diminutive adenoma-repeat recommended in 7 years 05/17/2020 EGD done for right upper quadrant pain with normal esophagus, gastric bypass with a patch 6 cm in length, gastrojejunal anastomosis characterized by healthy-appearing mucosa and normal jejunum  Past Medical History:  Diagnosis Date  . Allergy    . Atrial flutter (HCC)    typical appearing  . Basal cell carcinoma     nose - 1985  . GERD (gastroesophageal reflux disease)   . H/O cardiac radiofrequency ablation 2015  . H/O gastric bypass 2005  . History of diabetes mellitus 11/13/2015  . Hypertension   . Hypokalemia   . Osteoporosis 2000  . Paroxysmal atrial fibrillation (HCC)   . Personal history of colonic polyps - sessile serrated 11/23/2013    Past Surgical History:  Procedure Laterality Date  . APPENDECTOMY    . ATRIAL FLUTTER ABLATION N/A 09/05/2014   Procedure: ATRIAL FLUTTER ABLATION;  Surgeon: Lynwood Rakers, MD;  Location: [facility] CATH LAB;  Service: Cardiovascular;  Laterality: N/A;  . BREAST EXCISIONAL BIOPSY Right 01/2016  . CARPAL TUNNEL RELEASE Bilateral   . CHOLECYSTECTOMY    . FOOT SURGERY Bilateral 2012   shorten bones in both feet  . GASTRIC BYPASS    . removal of ovary Right 1987  . SPINE SURGERY  2000/2012   anterior cervical fusion with plating  . TOTAL ABDOMINAL HYSTERECTOMY  1999  . WRIST SURGERY Right 2018 **Note De-Identified Fross Obfuscation** Dr. Ahmad -ganglion excision    Family History  Problem Relation Age of Onset  . Diabetes Mother   . Heart disease Mother   . Arthritis Mother   . Hearing loss Mother   . Hyperlipidemia Mother   . Hypertension Mother   . Kidney disease Mother   . Learning disabilities Mother   . Miscarriages / Stillbirths Mother   . Heart disease Father   . Hypertension Father   . Alcohol abuse Father   . Hyperlipidemia Father   . Scleroderma Sister   . Early death Sister        schlederma  . Miscarriages / Stillbirths Sister   . Emphysema Sister   . Diabetes Sister   . Hyperlipidemia Sister   .  Hypertension Sister   . Mental illness Sister   . Miscarriages / Stillbirths Sister   . Stroke Sister   . Diabetes Sister   . Heart disease Brother   . Cancer Brother        lung  . Stroke Brother   . Lung cancer Brother        smoker  . Diabetes Brother   . Emphysema Maternal Grandmother   . Cancer Maternal Grandmother        throat and lung(smoker)  . Esophageal cancer Maternal Grandmother   . Colon cancer Maternal Grandfather   . Cancer Maternal Grandfather        colon  . Pancreatic cancer Neg Hx   . Rectal cancer Neg Hx   . Stomach cancer Neg Hx     Social History   Tobacco Use  . Smoking status: Never  . Smokeless tobacco: Never  Vaping Use  . Vaping status: Never Used  Substance Use Topics  . Alcohol use: No  . Drug use: No    Prior to Admission medications   Medication Sig Start Date End Date Taking? Authorizing Provider  citalopram  (CELEXA ) 40 MG tablet Take 1 tablet (40 mg total) by mouth daily. 03/11/23  Yes Kennyth Worth HERO, MD  Cyanocobalamin  (VITAMIN B 12 PO) Take 1 tablet by mouth daily.   Yes [provider]  cyclobenzaprine  (FLEXERIL ) 10 MG tablet Take 10 mg by mouth 3 (three) times daily as needed for muscle spasms.   Yes [provider]  estradiol  (ESTRACE ) 0.1 MG/GM vaginal cream Place 1 Applicatorful vaginally at bedtime. 03/11/23  Yes Kennyth Worth HERO, MD  estradiol  (ESTRACE ) 0.5 MG tablet Take 1 tablet (0.5 mg total) by mouth daily. 03/11/23  Yes Kennyth Worth HERO, MD  gabapentin  (NEURONTIN ) 300 MG capsule Take 300 mg by mouth 3 (three) times daily as needed.   Yes [provider]  Multiple Vitamins-Minerals (MULTIVITAMIN GUMMIES ADULT PO) Take 1 tablet by mouth daily.   Yes [provider]  propranolol  ER (INDERAL  LA) 60 MG 24 hr capsule Take 1 capsule (60 mg total) by mouth daily. 03/11/23  Yes Kennyth Worth HERO, MD  Vitamin D , Ergocalciferol , (DRISDOL ) 1.25 MG (50000 UNIT) CAPS capsule Take 50000IU once weekly. 06/08/23   Yes Kennyth Worth HERO, MD  predniSONE  (DELTASONE ) 10 MG tablet Take 4 tablets (40 mg total) by mouth daily. Patient not taking: Reported on 06/23/2023 06/03/23   Zackowski, Scott, MD    Current Facility-Administered Medications  Medication Dose Route Frequency Provider Last Rate Last Admin  . acetaminophen  (TYLENOL ) tablet 650 mg  650 mg Oral Q6H PRN Zella, Mir M, MD       Or  . acetaminophen  (TYLENOL ) suppository 650 mg **Note De-Identified Serrao Obfuscation** 650 mg Rectal Q6H PRN Zella, Mir M, MD      . albuterol  (PROVENTIL ) (2.5 MG/3ML) 0.083% nebulizer solution 2.5 mg  2.5 mg Nebulization Q2H PRN Zella, Mir M, MD      . citalopram  (CELEXA ) tablet 40 mg  40 mg Oral Daily Zella, Mir M, MD      . cyclobenzaprine  (FLEXERIL ) tablet 10 mg  10 mg Oral TID PRN Zella, Mir M, MD      . estradiol  (ESTRACE ) tablet 0.5 mg  0.5 mg Oral Daily Zella, Mir M, MD      . gabapentin  (NEURONTIN ) capsule 300 mg  300 mg Oral TID PRN Zella, Mir M, MD      . ondansetron  (ZOFRAN ) tablet 4 mg  4 mg Oral Q6H PRN Zella, Mir M, MD       Or  . ondansetron  (ZOFRAN ) injection 4 mg  4 mg Intravenous Q6H PRN Zella, Mir M, MD      . pantoprazole  (PROTONIX ) injection 40 mg  40 mg Intravenous Q12H Zella, Mir M, MD      . traZODone  (DESYREL ) tablet 25 mg  25 mg Oral QHS PRN Zella Katha HERO, MD       Current Outpatient Medications  Medication Sig Dispense Refill  . citalopram  (CELEXA ) 40 MG tablet Take 1 tablet (40 mg total) by mouth daily. 90 tablet 3  . Cyanocobalamin  (VITAMIN B 12 PO) Take 1 tablet by mouth daily.    . cyclobenzaprine  (FLEXERIL ) 10 MG tablet Take 10 mg by mouth 3 (three) times daily as needed for muscle spasms.    . estradiol  (ESTRACE ) 0.1 MG/GM vaginal cream Place 1 Applicatorful vaginally at bedtime. 42.5 g 11  . estradiol  (ESTRACE ) 0.5 MG tablet Take 1 tablet (0.5 mg total) by mouth daily. 90 tablet 3  . gabapentin  (NEURONTIN ) 300 MG capsule Take 300 mg by mouth 3 (three) times daily as  needed.    . Multiple Vitamins-Minerals (MULTIVITAMIN GUMMIES ADULT PO) Take 1 tablet by mouth daily.    . propranolol  ER (INDERAL  LA) 60 MG 24 hr capsule Take 1 capsule (60 mg total) by mouth daily. 90 capsule 3  . Vitamin D , Ergocalciferol , (DRISDOL ) 1.25 MG (50000 UNIT) CAPS capsule Take 50000IU once weekly. 12 capsule 3  . predniSONE  (DELTASONE ) 10 MG tablet Take 4 tablets (40 mg total) by mouth daily. (Patient not taking: Reported on 06/23/2023) 20 tablet 0    Allergies as of 06/22/2023 - Review Complete 06/22/2023  Allergen Reaction Noted  . Amoxicillin -pot clavulanate Nausea Only 08/13/2013  . Clarithromycin Hives 11/09/2009  . Nitrofurantoin Other (See Comments) and Rash 03/08/2007  . Sulfonamide derivatives Other (See Comments) 03/08/2007  . Azithromycin  Other (See Comments) 07/25/2014  . Nitrofurantoin macrocrystal Swelling 07/19/2014  . Sulfa antibiotics Nausea And Vomiting and Other (See Comments) 07/19/2014     Review of Systems:    Constitutional: No weight loss, fever or chills Skin: No rash  Cardiovascular: No chest pain Respiratory: No SOB Gastrointestinal: See HPI and otherwise negative Genitourinary: No dysuria Neurological: No headache or syncope Musculoskeletal: No new muscle or joint pain Hematologic: No bruising Psychiatric: No history of depression or anxiety    Physical Exam:  Vital signs in last 24 hours: Temp:  [97.9 F (36.6 C)-98.2 F (36.8 C)] 98.1 F (36.7 C) (01/07 0813) Pulse Rate:  [78-86] 84 (01/07 0412) Resp:  [16-17] 17 (01/07 0412) BP: (126-155)/(71-81) 126/73 (01/07 0412) SpO2:  [94 %-98 %] 94 % (01/07 0412) Weight:  [87.1 kg] 87.1 **Note De-Identified Bady Obfuscation** kg (01/06 1251)   General:   Pleasant Caucasian female appears to be in NAD, Well developed, Well nourished, alert and cooperative Head:  Normocephalic and atraumatic. Eyes:   PEERL, EOMI. No icterus. Conjunctiva pink. Ears:  Normal auditory acuity. Neck:  Supple Throat: Oral cavity and pharynx without  inflammation, swelling or lesion. Teeth in good condition. Lungs: Respirations even and unlabored. Lungs clear to auscultation bilaterally.   No wheezes, crackles, or rhonchi.  Heart: Normal S1, S2. No MRG. Regular rate and rhythm. No peripheral edema, cyanosis or pallor.  Abdomen:  Soft, nondistended, mild generalized ttp. No rebound or guarding. Normal bowel sounds. No appreciable masses or hepatomegaly. Rectal:  Not performed.  Msk:  Symmetrical without gross deformities. Peripheral pulses intact.  Extremities:  Without edema, no deformity or joint abnormality. Normal ROM, normal sensation. Neurologic:  Alert and  oriented x4;  grossly normal neurologically.  Skin:   Dry and intact without significant lesions or rashes. Psychiatric: Demonstrates good judgement and reason without abnormal affect or behaviors.   LAB RESULTS: Recent Labs    06/22/23 1340 06/23/23 0454  WBC 6.2  --   HGB 10.8* 9.9*  HCT 32.2* 29.9*  PLT 190  --    BMET Recent Labs    06/22/23 1340  NA 137  K 3.6  CL 106  CO2 23  GLUCOSE 111*  BUN 26*  CREATININE 0.46  CALCIUM  8.6*   LFT Recent Labs    06/22/23 1340  PROT 6.7  ALBUMIN  3.7  AST 21  ALT 21  ALKPHOS 79  BILITOT 0.7   STUDIES: CT ABDOMEN PELVIS W CONTRAST Result Date: 06/22/2023 CLINICAL DATA:  Nausea for several months. Acute abdominal pain. Melena. EXAM: CT ABDOMEN AND PELVIS WITH CONTRAST TECHNIQUE: Multidetector CT imaging of the abdomen and pelvis was performed using the standard protocol following bolus administration of intravenous contrast. RADIATION DOSE REDUCTION: This exam was performed according to the departmental dose-optimization program which includes automated exposure control, adjustment of the mA and/or kV according to patient size and/or use of iterative reconstruction technique. CONTRAST:  OMNIPAQUE  IOHEXOL  300 MG/ML  SOLN COMPARISON:  05/07/2020 FINDINGS: Lower Chest: No acute findings. Hepatobiliary: No suspicious  hepatic masses identified. Mild diffuse hepatic steatosis noted. Prior cholecystectomy. No evidence of biliary obstruction. Pancreas:  No mass or inflammatory changes. Spleen: Within normal limits in size and appearance. Adrenals/Urinary Tract: No suspicious masses identified. No evidence of ureteral calculi or hydronephrosis. Stomach/Bowel: Prior gastric bypass surgery again noted. No evidence of obstruction, inflammatory process or abnormal fluid collections. Vascular/Lymphatic: No pathologically enlarged lymph nodes. No acute vascular findings. Reproductive: Prior hysterectomy noted. Adnexal regions are unremarkable in appearance. Other:  None. Musculoskeletal:  No suspicious bone lesions identified. IMPRESSION: No acute findings within the abdomen or pelvis. Mild hepatic steatosis. Electronically Signed   By: Norleen DELENA Kil M.D.   On: 06/22/2023 15:16    Impression / Plan:   Impression: 1.  Acute blood loss anemia: With vague abdominal discomfort and reported melenic stools for 2 days, baseline hemoglobin normal down to 9.9 overnight, BUN slightly elevated, history of gastric bypass 20 some years ago; consider upper GI bleed 2.  Hypertension 3.  Depression 4.  Cervical neuropathy  Plan: 1.  Plan for EGD this afternoon at 2:00 with Dr. Abran.  Did discuss risks, benefits, limitations and alternatives and patient agrees to proceed. 2.  Continue Pantoprazole  40 mg IV twice daily 3.  Patient will remain n.p.o. until after time of procedure. 4. **Note De-Identified [place] Obfuscation** Please await any further recommendations from Dr. Abran after time of procedure.  Thank you for your kind consultation, we will continue to follow.  Delon Gibson [facility]  06/23/2023, 9:19 AM

## 2023-06-23 NOTE — Anesthesia Preprocedure Evaluation (Addendum)
**Note De-Identified Pinkney Obfuscation** Anesthesia Evaluation  Patient identified by MRN, date of birth, ID band Patient awake    Reviewed: Allergy  & Precautions, NPO status , Patient's Chart, lab work & pertinent test results  Airway Mallampati: II  TM Distance: >3 FB Neck ROM: Limited    Dental no notable dental hx.    Pulmonary neg pulmonary ROS   Pulmonary exam normal        Cardiovascular Normal cardiovascular exam+ dysrhythmias (s/p ablation) Atrial Fibrillation      Neuro/Psych Tremor  negative psych ROS   GI/Hepatic Neg liver ROS,,,  Endo/Other  negative endocrine ROS    Renal/GU negative Renal ROS     Musculoskeletal  (+) Arthritis ,    Abdominal  (+) + obese  Peds  Hematology  (+) Blood dyscrasia, anemia   Anesthesia Other Findings Melena Acute blood loss anemia  Reproductive/Obstetrics                             Anesthesia Physical Anesthesia Plan  ASA: 2  Anesthesia Plan: MAC   Post-op Pain Management:    Induction:   PONV Risk Score and Plan: 2 and Propofol  infusion and Treatment may vary due to age or medical condition  Airway Management Planned: Nasal Cannula  Additional Equipment:   Intra-op Plan:   Post-operative Plan:   Informed Consent: I have reviewed the patients History and Physical, chart, labs and discussed the procedure including the risks, benefits and alternatives for the proposed anesthesia with the patient or authorized representative who has indicated his/her understanding and acceptance.       Plan Discussed with: CRNA  Anesthesia Plan Comments:        Anesthesia Quick Evaluation

## 2023-06-23 NOTE — ED Provider Notes (Signed)
**Note De-Identified Silvester Obfuscation** Care assumed at midnight.  Patient with history of prior gastric bypass here for evaluation of 3 episodes of dark and bloody stool.  Hemoglobin is downtrending.  Care assumed pending medicine admission. Discussed with Dr. Alfornia regarding admission   Griselda Norris, MD 06/23/23 [phone]

## 2023-06-23 NOTE — Telephone Encounter (Signed)
**Note De-Identified Koontz Obfuscation** Patient present in the hospital

## 2023-06-23 NOTE — Telephone Encounter (Signed)
 See previews note

## 2023-06-23 NOTE — Op Note (Addendum)
**Note De-Identified [place] Obfuscation** [facility] Patient Name: Felicia Schultz Procedure Date: 06/23/2023 MRN: [id number] Attending MD: Aloha Finner , MD, [id number] Date of Birth: Oct 24, 1950 CSN: [id number] Age: 73 Admit Type: Outpatient Procedure:                Small bowel enteroscopy Indications:              Acute post hemorrhagic anemia, Melena,                            Gastrointestinal occult blood loss Providers:                Aloha Finner, MD, Burnard Fire RN, RN,                            Haskel Chris, Technician Referring MD:             Inpatient Medical Service Medicines:                Monitored Anesthesia Care Complications:            No immediate complications. Estimated Blood Loss:     Estimated blood loss was minimal. Procedure:                Pre-Anesthesia Assessment:                           - Prior to the procedure, a History and Physical                            was performed, and patient medications and                            allergies were reviewed. The patient's tolerance of                            previous anesthesia was also reviewed. The risks                            and benefits of the procedure and the sedation                            options and risks were discussed with the patient.                            All questions were answered, and informed consent                            was obtained. Prior Anticoagulants: The patient has                            taken no anticoagulant or antiplatelet agents. ASA                            Grade Assessment: III - A patient with severe **Note De-Identified Shankel Obfuscation** systemic disease. After reviewing the risks and                            benefits, the patient was deemed in satisfactory                            condition to undergo the procedure.                           After obtaining informed consent, the endoscope was                            passed under direct vision. Throughout the                             procedure, the patient's blood pressure, pulse, and                            oxygen saturations were monitored continuously. The                            [id number] ([id number]) Olympus therapeutic endoscope                            was introduced through the mouth, and advanced to                            the proximal jejunum. After obtaining informed                            consent, the endoscope was passed under direct                            vision. Throughout the procedure, the patient's                            blood pressure, pulse, and oxygen saturations were                            monitored continuously. The [id number] ([id number])                            Olympus slim colonoscope was introduced through the                            mouth and advanced to the afferent and efferent                            jejunal loop. The small bowel enteroscopy was                            accomplished without difficulty. The patient **Note De-Identified [place] Obfuscation** tolerated the procedure. Scope In: Scope Out: Findings:      No gross lesions were noted in the entire esophagus.      The Z-line was irregular and was found 35 cm from the incisors.      Evidence of a Roux-en-Y gastrojejunostomy was found. The       pouch-to-jejunum limb was characterized by mild erythema (it was       approximately 6 cm in length) - biopsied for HP assessment. The       gastrojejunal anastomosis was characterized by congestion, edema, a       hemorrhagic appearance and there was notation of an ulcer. This was able       to be traversed. The duodenum-to-jejunum limb was examined and was       characterized by healthy appearing mucosa. The jejunojejunal anastomosis       was characterized by healthy appearing mucosa (found at 110 cm). The       afferent limb was intubated as far as possible and noted to be normal in       appearance - tattooed for distal extent with SBE today.  The efferent       llimb was intubated as far as possible and noted to be normal in       appearance - tattooed for distal extent with SBE today. The excluded       stomach was not examined as it could not be reached. Impression:               - No gross lesions in the entire esophagus.                           - Z-line irregular, 35 cm from the incisors.                           - Roux-en-Y gastric bypass noted.                           -- Erythematous gastric pouch (6 cm) - biopsied.                           -- Gastrojejunostomy anastomosis characterized by                            congestion, edema, a hemorrhagic appearance and                            ulcer.                           - Jejunojejunal anastomosis reach and healthy in                            appearance.                           - Afferent and Efferent limbs (that could be                            reached) were normal in appearance - tattooed **Note De-Identified Dosher Obfuscation** distal extent of each intubation. Recommendation:           - The patient will be observed post-procedure,                            until all discharge criteria are met.                           - Return patient to hospital ward for ongoing care.                           - IV PPI BID x 24 hours. May transition if doing                            well tomorrow to PPI BID (consider liquid                            Omeprazole  or granules opened).                           - Carafate  four times daily for 2 weeks and then                            may decrease to twice daily for 1 month then may                            stop.                           - Await pathology results.                           - Consider repeat EGD in 3-4 months (if not then                            primary GI can consider leaving patient on PPI                            therapy as per lowest dosing possible to minimize                            risk of  recurrence).                           - Trend Hgb/Hct into tomorrow.                           - Setup for outpatient IV Iron infusion if able.                           - If patient remains stable without significant                            transfusion dependent anemia development, I think **Note De-Identified Vokes Obfuscation** we can hold off on Colonoscopy/VCE at this time.                           - The findings and recommendations were discussed                            with the patient.                           - The findings and recommendations were discussed                            with the referring physician. Procedure Code(s):        --- Professional ---                           [id number], Small intestinal endoscopy, enteroscopy                            beyond second portion of duodenum, not including                            ileum; with biopsy, single or multiple                           44799, Unlisted procedure, small intestine Diagnosis Code(s):        --- Professional ---                           K22.89, Other specified disease of esophagus                           Z98.0, Intestinal bypass and anastomosis status                           D62, Acute posthemorrhagic anemia                           K92.1, Melena (includes Hematochezia)                           R19.5, Other fecal abnormalities CPT copyright 2022 [employer]. All rights reserved. The codes documented in this report are preliminary and upon coder review may  be revised to meet current compliance requirements. Aloha Finner, MD 06/23/2023 11:51:44 AM Number of Addenda: 0

## 2023-06-23 NOTE — Telephone Encounter (Signed)
**Note De-Identified [place] Obfuscation** Copied from CRM [id number]. Topic: Clinical - Request for Lab/Test Order >> Jun 23, 2023  9:58 AM Isabell A wrote: Reason for CRM: Pansy from [facility] states the order for CT abdomen w/ contrast needs to be changed to abdomen and pelvis.

## 2023-06-24 ENCOUNTER — Encounter (HOSPITAL_COMMUNITY): Payer: Self-pay | Admitting: Internal Medicine

## 2023-06-24 ENCOUNTER — Telehealth: Payer: Self-pay

## 2023-06-24 DIAGNOSIS — K921 Melena: Secondary | ICD-10-CM | POA: Diagnosis not present

## 2023-06-24 DIAGNOSIS — D62 Acute posthemorrhagic anemia: Secondary | ICD-10-CM | POA: Diagnosis not present

## 2023-06-24 DIAGNOSIS — Z9884 Bariatric surgery status: Secondary | ICD-10-CM

## 2023-06-24 DIAGNOSIS — K289 Gastrojejunal ulcer, unspecified as acute or chronic, without hemorrhage or perforation: Secondary | ICD-10-CM | POA: Diagnosis not present

## 2023-06-24 DIAGNOSIS — K922 Gastrointestinal hemorrhage, unspecified: Secondary | ICD-10-CM | POA: Diagnosis not present

## 2023-06-24 LAB — CBC
HCT: 30.8 % — ABNORMAL LOW (ref 36.0–46.0)
Hemoglobin: 10.5 g/dL — ABNORMAL LOW (ref 12.0–15.0)
MCH: 32.5 pg (ref 26.0–34.0)
MCHC: 34.1 g/dL (ref 30.0–36.0)
MCV: 95.4 fL (ref 80.0–100.0)
Platelets: 178 10*3/uL (ref 150–400)
RBC: 3.23 MIL/uL — ABNORMAL LOW (ref 3.87–5.11)
RDW: 13.1 % (ref 11.5–15.5)
WBC: 4.9 10*3/uL (ref 4.0–10.5)
nRBC: 0 % (ref 0.0–0.2)

## 2023-06-24 MED ORDER — PANTOPRAZOLE SODIUM 40 MG PO TBEC
40.0000 mg | DELAYED_RELEASE_TABLET | Freq: Two times a day (BID) | ORAL | Status: DC
Start: 2023-06-24 — End: 2023-06-24
  Administered 2023-06-24: 40 mg via ORAL
  Filled 2023-06-24: qty 1

## 2023-06-24 MED ORDER — SUCRALFATE 1 GM/10ML PO SUSP: 1.0000 g | Freq: Three times a day (TID) | ORAL | 0 refills | Status: DC

## 2023-06-24 MED ORDER — PANTOPRAZOLE SODIUM 40 MG PO TBEC: 40.0000 mg | DELAYED_RELEASE_TABLET | Freq: Two times a day (BID) | ORAL | 0 refills | Status: DC

## 2023-06-24 NOTE — Progress Notes (Addendum)
**Note De-Identified [place] Obfuscation** Progress Note   Subjective  Chief Complaint: Acute blood loss anemia with melena  EGD 06/23/2023 with anastomotic ulcer-nonbleeding at time of exam  Hemoglobin 9.7--> 10.5 overnight  Patient describes that just after time of EGD yesterday she did have 1 further bowel movement which is still dark/black in coloration, no further since then.  She has not tried to eat much this morning and is still on a full liquid diet.   Objective   Vital signs in last 24 hours: Temp:  [97.7 F (36.5 C)-98.5 F (36.9 C)] 97.8 F (36.6 C) (01/08 0635) Pulse Rate:  [57-93] 57 (01/08 0800) Resp:  [14-25] 14 (01/08 0800) BP: (94-163)/(51-137) 104/66 (01/08 0800) SpO2:  [94 %-100 %] 98 % (01/08 0800) Weight:  [87.1 kg] 87.1 kg (01/07 1051) Last BM Date : 06/23/23 General:    white female in NAD Heart:  Regular rate and rhythm; no murmurs Lungs: Respirations even and unlabored, lungs CTA bilaterally Abdomen:  Soft, mild epigastric TTP and nondistended. Normal bowel sounds. Psych:  Cooperative. Normal mood and affect.  Intake/Output from previous day: 01/07 0701 - 01/08 0700 In: 300 [I.V.:300] Out: -  Intake/Output this shift: Total I/O In: 120 [P.O.:120] Out: -   Lab Results: Recent Labs    06/22/23 1340 06/23/23 0454 06/23/23 1814 06/24/23 0603  WBC 6.2  --   --  4.9  HGB 10.8* 9.9* 9.7* 10.5*  HCT 32.2* 29.9* 28.8* 30.8*  PLT 190  --   --  178   BMET Recent Labs    06/22/23 1340  NA 137  K 3.6  CL 106  CO2 23  GLUCOSE 111*  BUN 26*  CREATININE 0.46  CALCIUM  8.6*   LFT Recent Labs    06/22/23 1340  PROT 6.7  ALBUMIN  3.7  AST 21  ALT 21  ALKPHOS 79  BILITOT 0.7    Studies/Results: CT ABDOMEN PELVIS W CONTRAST Result Date: 06/22/2023 CLINICAL DATA:  Nausea for several months. Acute abdominal pain. Melena. EXAM: CT ABDOMEN AND PELVIS WITH CONTRAST TECHNIQUE: Multidetector CT imaging of the abdomen and pelvis was performed using the standard protocol following  bolus administration of intravenous contrast. RADIATION DOSE REDUCTION: This exam was performed according to the departmental dose-optimization program which includes automated exposure control, adjustment of the mA and/or kV according to patient size and/or use of iterative reconstruction technique. CONTRAST:  OMNIPAQUE  IOHEXOL  300 MG/ML  SOLN COMPARISON:  05/07/2020 FINDINGS: Lower Chest: No acute findings. Hepatobiliary: No suspicious hepatic masses identified. Mild diffuse hepatic steatosis noted. Prior cholecystectomy. No evidence of biliary obstruction. Pancreas:  No mass or inflammatory changes. Spleen: Within normal limits in size and appearance. Adrenals/Urinary Tract: No suspicious masses identified. No evidence of ureteral calculi or hydronephrosis. Stomach/Bowel: Prior gastric bypass surgery again noted. No evidence of obstruction, inflammatory process or abnormal fluid collections. Vascular/Lymphatic: No pathologically enlarged lymph nodes. No acute vascular findings. Reproductive: Prior hysterectomy noted. Adnexal regions are unremarkable in appearance. Other:  None. Musculoskeletal:  No suspicious bone lesions identified. IMPRESSION: No acute findings within the abdomen or pelvis. Mild hepatic steatosis. Electronically Signed   By: Norleen DELENA Kil M.D.   On: 06/22/2023 15:16   EGD 06/23/2023 Impression:               - No gross lesions in the entire esophagus.                           - Z-line irregular, **Note De-Identified [place] Obfuscation** 35 cm from the incisors.                           - Roux-en-Y gastric bypass noted.                           -- Erythematous gastric pouch (6 cm) - biopsied.                           -- Gastrojejunostomy anastomosis characterized by                            congestion, edema, a hemorrhagic appearance and                            ulcer.                           - Jejunojejunal anastomosis reach and healthy in                            appearance.                           -  Afferent and Efferent limbs (that could be                            reached) were normal in appearance - tattooed                            distal extent of each intubation. Recommendation:           - The patient will be observed post-procedure,                            until all discharge criteria are met.                           - Return patient to hospital ward for ongoing care.                           - IV PPI BID x 24 hours. May transition if doing                            well tomorrow to PPI BID (consider liquid                            Omeprazole  or granules opened).                           - Carafate  four times daily for 2 weeks and then                            may decrease to twice daily for 1 month then may **Note De-Identified [place] Obfuscation** stop.                           - Await pathology results.                           - Consider repeat EGD in 3-4 months (if not then                            primary GI can consider leaving patient on PPI                            therapy as per lowest dosing possible to minimize                            risk of recurrence).                           - Trend Hgb/Hct into tomorrow.                           - Setup for outpatient IV Iron infusion if able.                           - If patient remains stable without significant                            transfusion dependent anemia development, I think                            we can hold off on Colonoscopy/VCE at this time.                           - The findings and recommendations were discussed                            with the patient.                           - The findings and recommendations were discussed                            with the referring physician.    Assessment / Plan:   Assessment: 1.  Acute blood loss anemia with melena: With vague abdominal discomfort and reported melena, baseline hemoglobin down from normal to 9.9--> 10.5 overnight, EGD  06/23/2023 with anastomotic ulcer and nonbleeding but thought to be the source of recent anemia 2.  Hypertension 3.  Depression 4.  Cervical neuropathy  Plan: 1.  Will have patient follow-up in our clinic in the next 3 to 4 weeks to be arranged for EGD in 3 to 4 months for recheck of anastomotic ulcer. 2.  Per recommendations from Dr. Wilhelmenia would continue Omeprazole  broken open and sprinkled on applesauce 40 mg twice daily at discharge given prior history of gastric bypass 3.  Continue Carafate  1 g 4 times daily for 2 weeks and then decrease to twice **Note De-Identified Stene Obfuscation** daily for a month, then stop 4.  Would recommend patient be set up for outpatient IV iron infusion if able 5.  Will advance patient's diet to regular 6.  Okay with patient discharge home today. 7.  Briefly discussed diet recommendations including GERD diet for now  NOTE: Follow-up has been arranged with Dr. Avram on January 23  Thank you for your kind consultation.  We will sign off.    LOS: 1 day   Delon Hendricks Failing  06/24/2023, 9:38 AM  GI ATTENDING  Interval history data reviewed.  Endoscopy findings discussed with Dr. Wilhelmenia.  Patient stable and ready for discharge.  Will go home on medical regimen as outlined above.  She has outpatient follow-up with Dr. Avram scheduled in 2 weeks.  Norleen SAILOR. Abran Raddle., M.D. [facility] Division of Gastroenterology

## 2023-06-24 NOTE — Discharge Summary (Signed)
**Note De-Identified [place] Obfuscation** Physician Discharge Summary  Felicia Schultz FMW:[id number] DOB: Apr 20, 1951 DOA: 06/22/2023  PCP: Kennyth Worth HERO, MD  Admit date: 06/22/2023 Discharge date: 06/24/2023  Admitted From: Home Disposition: Home  Recommendations for Outpatient Follow-up:  Follow up with PCP in 1-2 weeks Follow-up with GI as scheduled  Home Health: None Equipment/Devices: None  Discharge Condition: Stable CODE STATUS: Full Diet recommendation: Low-fat low-salt low acid diet  Brief/Interim Summary: Felicia Schultz is a 73 y.o. female with medical history significant for GERD, hypertension, paroxysmal atrial fibrillation not on anticoagulation being admitted to the hospital with suspicion of blood loss anemia due to upper GI bleed.  Patient admitted as above, GI consulted, EGD 06/23/2023 shows anastomotic ulcer that is nonbleeding at the time of exam.  Hemoglobin stabilizing overnight otherwise stable and agreeable for discharge home.  Discussed need for low-salt low-fat low acid diet, continue antacids and new medications as below per GI.  Outpatient follow-up with PCP and GI as previously scheduled.  Discharge Diagnoses:  Principal Problem:   Upper GI bleed Active Problems:   Gastrojejunal ulcer with hemorrhage   Acute blood loss anemia   Acute GI bleeding    Discharge Instructions  Discharge Instructions     Call MD for:  difficulty breathing, headache or visual disturbances   Complete by: As directed    Call MD for:  extreme fatigue   Complete by: As directed    Call MD for:  persistant dizziness or light-headedness   Complete by: As directed    Call MD for:  persistant nausea and vomiting   Complete by: As directed    Call MD for:  redness, tenderness, or signs of infection (pain, swelling, redness, odor or green/yellow discharge around incision site)   Complete by: As directed    Call MD for:  severe uncontrolled pain   Complete by: As directed    Call MD for:  temperature >100.4   Complete by: As  directed    Diet - low sodium heart healthy   Complete by: As directed    Increase activity slowly   Complete by: As directed       Allergies as of 06/24/2023       Reactions   Amoxicillin -pot Clavulanate Nausea Only   Severe nausea. Couldn't keep medication down Severe nausea. Couldn't keep medication down   Clarithromycin Hives   Nitrofurantoin Other (See Comments), Rash   Unknown Palms and hand itching, turned bright red per pt   Sulfonamide Derivatives Other (See Comments)   Abdominal cramping, nausea   Azithromycin  Other (See Comments)   Z-pack, doesn't do anything Z-pack, doesn't do anything Z-pack, doesn't do anything Z-pack, doesn't do anything   Nitrofurantoin Macrocrystal Swelling   Sulfa Antibiotics Nausea And Vomiting, Other (See Comments)   Unknown        Medication List     STOP taking these medications    predniSONE  10 MG tablet Commonly known as: DELTASONE        TAKE these medications    citalopram  40 MG tablet Commonly known as: CELEXA  Take 1 tablet (40 mg total) by mouth daily.   cyclobenzaprine  10 MG tablet Commonly known as: FLEXERIL  Take 10 mg by mouth 3 (three) times daily as needed for muscle spasms.   estradiol  0.1 MG/GM vaginal cream Commonly known as: ESTRACE  Place 1 Applicatorful vaginally at bedtime.   estradiol  0.5 MG tablet Commonly known as: ESTRACE  Take 1 tablet (0.5 mg total) by mouth daily.   gabapentin  300 MG capsule **Note De-Identified Dom Obfuscation** Commonly known as: NEURONTIN  Take 300 mg by mouth 3 (three) times daily as needed.   MULTIVITAMIN GUMMIES ADULT PO Take 1 tablet by mouth daily.   pantoprazole  40 MG tablet Commonly known as: PROTONIX  Take 1 tablet (40 mg total) by mouth 2 (two) times daily.   propranolol  ER 60 MG 24 hr capsule Commonly known as: Inderal  LA Take 1 capsule (60 mg total) by mouth daily.   sucralfate  1 GM/10ML suspension Commonly known as: CARAFATE  Take 10 mLs (1 g total) by mouth 4 (four) times daily -  with  meals and at bedtime.   VITAMIN B 12 PO Take 1 tablet by mouth daily.   Vitamin D  (Ergocalciferol ) 1.25 MG (50000 UNIT) Caps capsule Commonly known as: DRISDOL  Take 50000IU once weekly.        Allergies  Allergen Reactions   Amoxicillin -Pot Clavulanate Nausea Only    Severe nausea. Couldn't keep medication down  Severe nausea. Couldn't keep medication down   Clarithromycin Hives   Nitrofurantoin Other (See Comments) and Rash    Unknown Palms and hand itching, turned bright red per pt   Sulfonamide Derivatives Other (See Comments)    Abdominal cramping, nausea   Azithromycin  Other (See Comments)    Z-pack, doesn't do anything Z-pack, doesn't do anything Z-pack, doesn't do anything Z-pack, doesn't do anything    Nitrofurantoin Macrocrystal Swelling   Sulfa Antibiotics Nausea And Vomiting and Other (See Comments)    Unknown    Consultations: GI  Procedures/Studies: CT ABDOMEN PELVIS W CONTRAST Result Date: 06/22/2023 CLINICAL DATA:  Nausea for several months. Acute abdominal pain. Melena. EXAM: CT ABDOMEN AND PELVIS WITH CONTRAST TECHNIQUE: Multidetector CT imaging of the abdomen and pelvis was performed using the standard protocol following bolus administration of intravenous contrast. RADIATION DOSE REDUCTION: This exam was performed according to the departmental dose-optimization program which includes automated exposure control, adjustment of the mA and/or kV according to patient size and/or use of iterative reconstruction technique. CONTRAST:  OMNIPAQUE  IOHEXOL  300 MG/ML  SOLN COMPARISON:  05/07/2020 FINDINGS: Lower Chest: No acute findings. Hepatobiliary: No suspicious hepatic masses identified. Mild diffuse hepatic steatosis noted. Prior cholecystectomy. No evidence of biliary obstruction. Pancreas:  No mass or inflammatory changes. Spleen: Within normal limits in size and appearance. Adrenals/Urinary Tract: No suspicious masses identified. No evidence of ureteral  calculi or hydronephrosis. Stomach/Bowel: Prior gastric bypass surgery again noted. No evidence of obstruction, inflammatory process or abnormal fluid collections. Vascular/Lymphatic: No pathologically enlarged lymph nodes. No acute vascular findings. Reproductive: Prior hysterectomy noted. Adnexal regions are unremarkable in appearance. Other:  None. Musculoskeletal:  No suspicious bone lesions identified. IMPRESSION: No acute findings within the abdomen or pelvis. Mild hepatic steatosis. Electronically Signed   By: Norleen DELENA Kil M.D.   On: 06/22/2023 15:16   MR Cervical Spine Wo Contrast Result Date: 06/18/2023 CLINICAL DATA:  Neck pain, chronic; Neuro deficit, acute, stroke suspected. Visual disturbance. Neck and posterior head pain. EXAM: MRI HEAD WITHOUT CONTRAST MRI CERVICAL SPINE WITHOUT CONTRAST TECHNIQUE: Multiplanar, multiecho pulse sequences of the brain and surrounding structures, and cervical spine, to include the craniocervical junction and cervicothoracic junction, were obtained without intravenous contrast. COMPARISON:  MRI cervical spine 08/06/2015. FINDINGS: MRI HEAD FINDINGS Brain: No acute infarct or hemorrhage. Background of mild chronic small-vessel disease. No hydrocephalus or extra-axial collection. No mass or midline shift. No foci of abnormal susceptibility. Vascular: Normal flow voids. Skull and upper cervical spine: Normal marrow signal. Sinuses/Orbits: No acute findings. Other: None. MRI CERVICAL SPINE FINDINGS **Note De-Identified Hipps Obfuscation** Alignment: No listhesis. Vertebrae: Unchanged postoperative appearance from prior C4-5 and C6-7 ACDF. Degenerative endplate marrow edema at C5-6. Cord: Normal spinal cord signal and volume. Posterior Fossa, vertebral arteries, paraspinal tissues: 15 mm nodule in the left thyroid  lobe. Disc levels: C2-C3: Right-greater-than-left facet arthropathy and uncovertebral joint spurring results in moderate right neural foraminal narrowing. C3-C4: Adjacent segment. Small disc bulge.  Left-greater-than-right facet arthropathy and uncovertebral joint spurring results in severe left and moderate right neural foraminal narrowing. C4-C5: Prior ACDF. No spinal canal stenosis or neural foraminal narrowing. C5-C6: Adjacent segment. Disc bulge results in moderate to severe spinal canal stenosis. Severe disc height loss, facet arthropathy and uncovertebral joint spurring contribute to severe bilateral neural foraminal narrowing. C6-C7: Prior ACDF. No spinal canal stenosis or neural foraminal narrowing. C7-T1: Adjacent segment. Left eccentric disc bulge and facet arthropathy results in severe left and moderate right neural foraminal narrowing. IMPRESSION: 1. No acute intracranial abnormality. 2. Unchanged postoperative appearance from prior C4-5 and C6-7 ACDF. 3. Adjacent segment degeneration at C5-6 results in moderate to severe spinal canal stenosis and severe bilateral neural foraminal narrowing. 4. Severe left and moderate right neural foraminal narrowing at C3-4 and C7-T1. 5. 15 mm nodule in the left thyroid  lobe. Recommend thyroid  US  (ref: J Am Coll Radiol. 2015 Feb;12(2): 143-50). Electronically Signed   By: Ryan Chess M.D.   On: 06/18/2023 08:59   MR Brain Wo Contrast Result Date: 06/18/2023 CLINICAL DATA:  Neck pain, chronic; Neuro deficit, acute, stroke suspected. Visual disturbance. Neck and posterior head pain. EXAM: MRI HEAD WITHOUT CONTRAST MRI CERVICAL SPINE WITHOUT CONTRAST TECHNIQUE: Multiplanar, multiecho pulse sequences of the brain and surrounding structures, and cervical spine, to include the craniocervical junction and cervicothoracic junction, were obtained without intravenous contrast. COMPARISON:  MRI cervical spine 08/06/2015. FINDINGS: MRI HEAD FINDINGS Brain: No acute infarct or hemorrhage. Background of mild chronic small-vessel disease. No hydrocephalus or extra-axial collection. No mass or midline shift. No foci of abnormal susceptibility. Vascular: Normal flow voids.  Skull and upper cervical spine: Normal marrow signal. Sinuses/Orbits: No acute findings. Other: None. MRI CERVICAL SPINE FINDINGS Alignment: No listhesis. Vertebrae: Unchanged postoperative appearance from prior C4-5 and C6-7 ACDF. Degenerative endplate marrow edema at C5-6. Cord: Normal spinal cord signal and volume. Posterior Fossa, vertebral arteries, paraspinal tissues: 15 mm nodule in the left thyroid  lobe. Disc levels: C2-C3: Right-greater-than-left facet arthropathy and uncovertebral joint spurring results in moderate right neural foraminal narrowing. C3-C4: Adjacent segment. Small disc bulge. Left-greater-than-right facet arthropathy and uncovertebral joint spurring results in severe left and moderate right neural foraminal narrowing. C4-C5: Prior ACDF. No spinal canal stenosis or neural foraminal narrowing. C5-C6: Adjacent segment. Disc bulge results in moderate to severe spinal canal stenosis. Severe disc height loss, facet arthropathy and uncovertebral joint spurring contribute to severe bilateral neural foraminal narrowing. C6-C7: Prior ACDF. No spinal canal stenosis or neural foraminal narrowing. C7-T1: Adjacent segment. Left eccentric disc bulge and facet arthropathy results in severe left and moderate right neural foraminal narrowing. IMPRESSION: 1. No acute intracranial abnormality. 2. Unchanged postoperative appearance from prior C4-5 and C6-7 ACDF. 3. Adjacent segment degeneration at C5-6 results in moderate to severe spinal canal stenosis and severe bilateral neural foraminal narrowing. 4. Severe left and moderate right neural foraminal narrowing at C3-4 and C7-T1. 5. 15 mm nodule in the left thyroid  lobe. Recommend thyroid  US  (ref: J Am Coll Radiol. 2015 Feb;12(2): 143-50). Electronically Signed   By: Ryan Chess M.D.   On: 06/18/2023 08:59   DG Chest 2 **Note De-Identified [place] Obfuscation** View Result Date: 06/03/2023 CLINICAL DATA:  Illness.  Generalized body ache.  Fatigue. EXAM: CHEST - 2 VIEW COMPARISON:  Chest  radiographs 03/18/2022 FINDINGS: Cardiac silhouette and mediastinal contours are within normal limits. The lungs are clear. No pleural effusion or pneumothorax. Mild multilevel disc space narrowing of the upper thoracic spine. Surgical clips again overlie the upper abdomen and the right breast. Partial visualization of ACDF hardware. IMPRESSION: No active cardiopulmonary disease. Electronically Signed   By: Tanda Lyons M.D.   On: 06/03/2023 17:57     Subjective: No acute issues or events overnight denies nausea vomiting diarrhea constipation headache fevers chills or chest pain   Discharge Exam: Vitals:   06/24/23 1130 06/24/23 1200  BP: 130/79 (!) 141/84  Pulse: 77 66  Resp: 14 18  Temp: 97.6 F (36.4 C)   SpO2: (!) 88% 98%   Vitals:   06/24/23 0715 06/24/23 0800 06/24/23 1130 06/24/23 1200  BP: (!) 106/58 104/66 130/79 (!) 141/84  Pulse: 61 (!) 57 77 66  Resp: 14 14 14 18   Temp:   97.6 F (36.4 C)   TempSrc:   Oral   SpO2: 96% 98% (!) 88% 98%  Weight:      Height:        General: Pt is alert, awake, not in acute distress Cardiovascular: RRR, S1/S2 +, no rubs, no gallops Respiratory: CTA bilaterally, no wheezing, no rhonchi Abdominal: Soft, NT, ND, bowel sounds + Extremities: no edema, no cyanosis    The results of significant diagnostics from this hospitalization (including imaging, microbiology, ancillary and laboratory) are listed below for reference.     Microbiology: No results found for this or any previous visit (from the past 240 hours).   Labs: BNP (last 3 results) No results for input(s): BNP in the last 8760 hours. Basic Metabolic Panel: Recent Labs  Lab 06/22/23 1340  NA 137  K 3.6  CL 106  CO2 23  GLUCOSE 111*  BUN 26*  CREATININE 0.46  CALCIUM  8.6*   Liver Function Tests: Recent Labs  Lab 06/22/23 1340  AST 21  ALT 21  ALKPHOS 79  BILITOT 0.7  PROT 6.7  ALBUMIN  3.7   Recent Labs  Lab 06/22/23 1340  LIPASE 30   No results  for input(s): AMMONIA in the last 168 hours. CBC: Recent Labs  Lab 06/22/23 1340 06/23/23 0454 06/23/23 1814 06/24/23 0603  WBC 6.2  --   --  4.9  NEUTROABS 4.3  --   --   --   HGB 10.8* 9.9* 9.7* 10.5*  HCT 32.2* 29.9* 28.8* 30.8*  MCV 95.5  --   --  95.4  PLT 190  --   --  178   Cardiac Enzymes: No results for input(s): CKTOTAL, CKMB, CKMBINDEX, TROPONINI in the last 168 hours. BNP: Invalid input(s): POCBNP CBG: No results for input(s): GLUCAP in the last 168 hours. D-Dimer No results for input(s): DDIMER in the last 72 hours. Hgb A1c No results for input(s): HGBA1C in the last 72 hours. Lipid Profile No results for input(s): CHOL, HDL, LDLCALC, TRIG, CHOLHDL, LDLDIRECT in the last 72 hours. Thyroid  function studies No results for input(s): TSH, T4TOTAL, T3FREE, THYROIDAB in the last 72 hours.  Invalid input(s): FREET3 Anemia work up No results for input(s): VITAMINB12, FOLATE, FERRITIN, TIBC, IRON, RETICCTPCT in the last 72 hours. Urinalysis    Component Value Date/Time   COLORURINE YELLOW 06/22/2023 2026   APPEARANCEUR CLEAR 06/22/2023 2026   LABSPEC 1.015 06/22/2023 2026 **Note De-Identified [place] Obfuscation** PHURINE 5.0 06/22/2023 2026   GLUCOSEU NEGATIVE 06/22/2023 2026   GLUCOSEU NEGATIVE 06/04/2023 1402   HGBUR SMALL (A) 06/22/2023 2026   HGBUR 1+ 05/20/2010 0937   BILIRUBINUR NEGATIVE 06/22/2023 2026   BILIRUBINUR n 04/04/2013 1140   KETONESUR 20 (A) 06/22/2023 2026   PROTEINUR 30 (A) 06/22/2023 2026   UROBILINOGEN 0.2 06/04/2023 1402   NITRITE NEGATIVE 06/22/2023 2026   LEUKOCYTESUR NEGATIVE 06/22/2023 2026   Sepsis Labs Recent Labs  Lab 06/22/23 1340 06/24/23 0603  WBC 6.2 4.9   Microbiology No results found for this or any previous visit (from the past 240 hours).   Time coordinating discharge: Over 30 minutes  SIGNED:   Elsie JAYSON [place], DO [facility] 06/24/2023, 3:29 PM Pager   If 7PM-7AM, please contact  night-coverage [email or web address]

## 2023-06-24 NOTE — Telephone Encounter (Signed)
**Note De-Identified [place] Obfuscation** Hospital follow up scheduled with Dr. Leone Payor on Friday, 07/09/23 at 1:50 pm.  MyChart message sent to patient.

## 2023-06-24 NOTE — Anesthesia Postprocedure Evaluation (Signed)
**Note De-Identified Becherer Obfuscation** Anesthesia Post Note  Patient: Quanetta Truss Petrenko  Procedure(s) Performed: ENTEROSCOPY SUBMUCOSAL TATTOO INJECTION BIOPSY     Patient location during evaluation: Endoscopy Anesthesia Type: MAC Level of consciousness: awake Pain management: pain level controlled Vital Signs Assessment: post-procedure vital signs reviewed and stable Respiratory status: spontaneous breathing, nonlabored ventilation and respiratory function stable Cardiovascular status: blood pressure returned to baseline and stable Postop Assessment: no apparent nausea or vomiting Anesthetic complications: no   No notable events documented.  Last Vitals:  Vitals:   06/24/23 0700 06/24/23 0715  BP:  (!) 106/58  Pulse: 69 61  Resp: 16 14  Temp:    SpO2: 94% 96%    Last Pain:  Vitals:   06/24/23 0237  TempSrc: Oral  PainSc:    Pain Goal:                   Bernardino SHAUNNA Mango

## 2023-06-25 LAB — SURGICAL PATHOLOGY

## 2023-06-25 NOTE — Telephone Encounter (Signed)
**Note De-Identified Hallett Obfuscation** Left message to return call to our office at their convenience.  Patient has CT done at hospital visit  No need of CT at this time

## 2023-06-25 NOTE — Telephone Encounter (Signed)
**Note De-Identified Cianci Obfuscation** Pansy with [facility] is needing order to be changed ASAP. Pt has appt today. Needs to be changed to CT Abdomen & Pelvis w/contrast.  [phone], ext [phone]

## 2023-06-26 ENCOUNTER — Encounter (HOSPITAL_COMMUNITY): Payer: Self-pay | Admitting: Gastroenterology

## 2023-06-26 ENCOUNTER — Other Ambulatory Visit: Payer: Self-pay

## 2023-06-30 ENCOUNTER — Ambulatory Visit: Payer: 59 | Admitting: Family Medicine

## 2023-06-30 ENCOUNTER — Encounter: Payer: Self-pay | Admitting: Family Medicine

## 2023-06-30 VITALS — BP 109/71 | HR 62 | Temp 97.2°F | Ht 64.0 in | Wt 186.0 lb

## 2023-06-30 DIAGNOSIS — K922 Gastrointestinal hemorrhage, unspecified: Secondary | ICD-10-CM

## 2023-06-30 DIAGNOSIS — M47812 Spondylosis without myelopathy or radiculopathy, cervical region: Secondary | ICD-10-CM

## 2023-06-30 DIAGNOSIS — E041 Nontoxic single thyroid nodule: Secondary | ICD-10-CM | POA: Diagnosis not present

## 2023-06-30 LAB — CBC
HCT: 32.8 % — ABNORMAL LOW (ref 36.0–46.0)
Hemoglobin: 10.9 g/dL — ABNORMAL LOW (ref 12.0–15.0)
MCHC: 33.2 g/dL (ref 30.0–36.0)
MCV: 93.2 fl (ref 78.0–100.0)
Platelets: 268 K/uL (ref 150.0–400.0)
RBC: 3.52 Mil/uL — ABNORMAL LOW (ref 3.87–5.11)
RDW: 13.8 % (ref 11.5–15.5)
WBC: 5.3 K/uL (ref 4.0–10.5)

## 2023-06-30 MED ORDER — HYDROCODONE-ACETAMINOPHEN 5-325 MG PO TABS: 1.0000 | ORAL_TABLET | Freq: Four times a day (QID) | ORAL | 0 refills | Status: DC | PRN

## 2023-06-30 NOTE — Patient Instructions (Signed)
**Note De-Identified Perrault Obfuscation** It was very nice to see you today!  We will check your blood counts today.  Please try the low-dose hydrocodone .  Let me know how this is working for you in a week or 2.  Please let me know if you cannot see neurosurgery soon.  We will complete your FMLA paperwork.  Return if symptoms worsen or fail to improve.   Take care, Dr Kennyth  PLEASE NOTE:  If you had any lab tests, please let us  know if you have not heard back within a few days. You may see your results on mychart before we have a chance to review them but we will give you a call once they are reviewed by us .   If we ordered any referrals today, please let us  know if you have not heard from their office within the next week.   If you had any urgent prescriptions sent in today, please check with the pharmacy within an hour of our visit to make sure the prescription was transmitted appropriately.   Please try these tips to maintain a healthy lifestyle:  Eat at least 3 REAL meals and 1-2 snacks per day.  Aim for no more than 5 hours between eating.  If you eat breakfast, please do so within one hour of getting up.   Each meal should contain half fruits/vegetables, one quarter protein, and one quarter carbs (no bigger than a computer mouse)  Cut down on sweet beverages. This includes juice, soda, and sweet tea.   Drink at least 1 glass of water with each meal and aim for at least 8 glasses per day  Exercise at least 150 minutes every week.

## 2023-06-30 NOTE — Assessment & Plan Note (Signed)
**Note De-Identified Buley Obfuscation** Incidentally found on recent MRI.  Will check ultrasound per radiology recommendations.

## 2023-06-30 NOTE — Assessment & Plan Note (Signed)
**Note De-Identified Kempton Obfuscation** She has not had any signs or symptoms of recurrent GI bleed since being discharged.  She has been compliant with Protonix  and Carafate .  Will recheck CBC today.  Unclear etiology for recent ulcer though concerned may have been precipitated by prednisone  burst last month.  She is avoiding NSAIDs.  Will defer further management to GI.  She will be seeing them in a couple of weeks.

## 2023-06-30 NOTE — Progress Notes (Signed)
**Note De-Identified Stalker Obfuscation** Chief Complaint:  Felicia Schultz is a 73 y.o. female who presents today for a TCM visit.  Assessment/Plan:  Chronic Problems Addressed Today: Upper GI bleed She has not had any signs or symptoms of recurrent GI bleed since being discharged.  She has been compliant with Protonix  and Carafate .  Will recheck CBC today.  Unclear etiology for recent ulcer though concerned may have been precipitated by prednisone  burst last month.  She is avoiding NSAIDs.  Will defer further management to GI.  She will be seeing them in a couple of weeks.  Cervical spondylosis Pain is still not controlled.  We have referred her to see neurosurgeon however she has not yet been able to schedule appointment.  She is still having a a lot of pain that is severely impacting her activities of daily living.  We discussed treatment options to help her with her pain management.  Need to avoid NSAIDs and prednisone  due to recent GI bleed.  She is currently taking Tylenol  over-the-counter with minimal improvement.  She takes gabapentin  nightly which works modestly well.  She does not have any improvement with Lyrica  in the past.  She has also been on tramadol  in the past and did not find that was effective.  Given the degree of her pain and limited alternative treatment options would be reasonable for us  to do short-term low-dose hydrocodone  until she can get into see neurosurgery.  She has been on this most recently a couple of years ago and did reasonably well with this.  She is aware of potential side effects and addiction potential.  Database was reviewed today without red flags.  She will follow-up with us  in a couple of weeks [facility] MyChart though will hopefully be able to see neurosurgery soon to discuss other options for pain management.  Thyroid  nodule Incidentally found on recent MRI.  Will check ultrasound per radiology recommendations.     Subjective:  HPI:  Summary of Hospital admission: Reason for admission: upper  GI bleed Date of admission: 06/22/2023 Date of discharge: 06/24/2023 Date of Interactive contact: N/A Summary of Hospital course: Patient presented to the ED on 06/22/2023 with dark stool concerning for GI bleed.  She was admitted for endoscopic evaluation.  Gastroenterology was consulted.  Underwent EGD on hospital day 1 that showed anastomotic ulcer that was nonbleeding.  Her hemoglobin was stable overnight and she was discharged home on twice daily protonix  and carafate .   Interim history:  Since being home she has not noticed any further episodes of GI bleeding.  She has been consistent with her medication.  Tolerating well without side effects.  Abdominal pain is still there but improving.  She is still having a lot of muscular pain and neck pain.  The did obtain an MRI recently which demonstrated spinal stenosis and foraminal stenosis in her cervical spine.  We referred her to her neurosurgery however she has not yet been able to schedule an appointment yet.  Pain is limiting her ability to perform activities of daily living.  ROS: Per HPI, otherwise a complete review of systems was negative.   PMH:  The following were reviewed and entered/updated in epic: Past Medical History:  Diagnosis Date   Allergy     Atrial flutter (HCC)    typical appearing   Basal cell carcinoma     nose - 1985   GERD (gastroesophageal reflux disease)    H/O cardiac radiofrequency ablation 2015   H/O gastric bypass 2005   History **Note De-Identified Rickels Obfuscation** of diabetes mellitus 11/13/2015   Hypertension    Hypokalemia    Osteoporosis 2000   Paroxysmal atrial fibrillation (HCC)    Personal history of colonic polyps - sessile serrated 11/23/2013   Patient Active Problem List   Diagnosis Date Noted   Thyroid  nodule 06/30/2023   Upper GI bleed 06/23/2023   Gastrojejunal ulcer with hemorrhage 06/23/2023   Acute blood loss anemia 06/23/2023   Acute GI bleeding 06/23/2023   Vitamin B12 deficiency 08/18/2022   Tremor 08/14/2021   History  of atrial fibrillation 08/03/2020   Bilateral dry eyes 03/28/2020   Vitreous membranes and strands, right 03/28/2020   Nuclear sclerotic cataract of right eye 03/28/2020   Nuclear sclerotic cataract of left eye 03/28/2020   Posterior vitreous detachment of left eye 03/28/2020   Posterior vitreous detachment of right eye 03/28/2020   Hyperglycemia 02/18/2019   S/P gastric bypass 07/09/2018   Achilles tendinosis 04/30/2018   Spondylosis of lumbar joint 05/28/2017   Plantar fasciitis, bilateral 05/28/2017   Chronic pain of right wrist 05/28/2017   Haglund's deformity of right heel 02/02/2017   Postmenopausal symptoms 06/02/2016   Cervical spondylosis 07/19/2014   GERD (gastroesophageal reflux disease) 07/19/2014   Vitamin D  deficiency 07/19/2014   History of colonic polyps 11/23/2013   Allergic rhinitis 11/11/2011   Past Surgical History:  Procedure Laterality Date   APPENDECTOMY     ATRIAL FLUTTER ABLATION N/A 09/05/2014   Procedure: ATRIAL FLUTTER ABLATION;  Surgeon: Lynwood Rakers, MD;  Location: [facility] CATH LAB;  Service: Cardiovascular;  Laterality: N/A;   BIOPSY  06/23/2023   Procedure: BIOPSY;  Surgeon: Wilhelmenia Aloha Raddle., MD;  Location: [facility];  Service: Gastroenterology;;   BREAST EXCISIONAL BIOPSY Right 01/2016   CARPAL TUNNEL RELEASE Bilateral    CHOLECYSTECTOMY     ENTEROSCOPY N/A 06/23/2023   Procedure: ENTEROSCOPY;  Surgeon: Wilhelmenia Aloha Raddle., MD;  Location: [facility];  Service: Gastroenterology;  Laterality: N/A;   FOOT SURGERY Bilateral 2012   shorten bones in both feet   GASTRIC BYPASS     removal of ovary Right 1987   SPINE SURGERY  2000/2012   anterior cervical fusion with plating   SUBMUCOSAL TATTOO INJECTION  06/23/2023   Procedure: SUBMUCOSAL TATTOO INJECTION;  Surgeon: Wilhelmenia Aloha Raddle., MD;  Location: [facility];  Service: Gastroenterology;;   TOTAL ABDOMINAL HYSTERECTOMY  1999   WRIST SURGERY Right 2018   Dr. Ahmad -ganglion excision     Family History  Problem Relation Age of Onset   Diabetes Mother    Heart disease Mother    Arthritis Mother    Hearing loss Mother    Hyperlipidemia Mother    Hypertension Mother    Kidney disease Mother    Learning disabilities Mother    Miscarriages / Stillbirths Mother    Heart disease Father    Hypertension Father    Alcohol abuse Father    Hyperlipidemia Father    Scleroderma Sister    Early death Sister        schlederma   Miscarriages / Stillbirths Sister    Emphysema Sister    Diabetes Sister    Hyperlipidemia Sister    Hypertension Sister    Mental illness Sister    Miscarriages / Stillbirths Sister    Stroke Sister    Diabetes Sister    Heart disease Brother    Cancer Brother        lung   Stroke Brother    Lung cancer Brother **Note De-Identified Brinker Obfuscation** smoker   Diabetes Brother    Emphysema Maternal Grandmother    Cancer Maternal Grandmother        throat and lung(smoker)   Esophageal cancer Maternal Grandmother    Colon cancer Maternal Grandfather    Cancer Maternal Grandfather        colon   Pancreatic cancer Neg Hx    Rectal cancer Neg Hx    Stomach cancer Neg Hx     Medications- Reconciled discharge and current medications in Epic.  Current Outpatient Medications  Medication Sig Dispense Refill   citalopram  (CELEXA ) 40 MG tablet Take 1 tablet (40 mg total) by mouth daily. 90 tablet 3   Cyanocobalamin  (VITAMIN B 12 PO) Take 1 tablet by mouth daily.     cyclobenzaprine  (FLEXERIL ) 10 MG tablet Take 10 mg by mouth 3 (three) times daily as needed for muscle spasms.     estradiol  (ESTRACE ) 0.1 MG/GM vaginal cream Place 1 Applicatorful vaginally at bedtime. 42.5 g 11   estradiol  (ESTRACE ) 0.5 MG tablet Take 1 tablet (0.5 mg total) by mouth daily. 90 tablet 3   gabapentin  (NEURONTIN ) 300 MG capsule Take 300 mg by mouth 3 (three) times daily as needed.     HYDROcodone -acetaminophen  (NORCO/VICODIN) 5-325 MG tablet Take 1 tablet by mouth every 6 (six) hours as needed for  moderate pain (pain score 4-6). 30 tablet 0   Multiple Vitamins-Minerals (MULTIVITAMIN GUMMIES ADULT PO) Take 1 tablet by mouth daily.     pantoprazole  (PROTONIX ) 40 MG tablet Take 1 tablet (40 mg total) by mouth 2 (two) times daily. 60 tablet 0   propranolol  ER (INDERAL  LA) 60 MG 24 hr capsule Take 1 capsule (60 mg total) by mouth daily. 90 capsule 3   sucralfate  (CARAFATE ) 1 GM/10ML suspension Take 10 mLs (1 g total) by mouth 4 (four) times daily -  with meals and at bedtime. 420 mL 0   Vitamin D , Ergocalciferol , (DRISDOL ) 1.25 MG (50000 UNIT) CAPS capsule Take 50000IU once weekly. 12 capsule 3   No current facility-administered medications for this visit.    Allergies-reviewed and updated Allergies  Allergen Reactions   Amoxicillin -Pot Clavulanate Nausea Only    Severe nausea. Couldn't keep medication down  Severe nausea. Couldn't keep medication down   Clarithromycin Hives   Nitrofurantoin Other (See Comments) and Rash    Unknown Palms and hand itching, turned bright red per pt   Sulfonamide Derivatives Other (See Comments)    Abdominal cramping, nausea   Azithromycin  Other (See Comments)    Z-pack, doesn't do anything Z-pack, doesn't do anything Z-pack, doesn't do anything Z-pack, doesn't do anything    Nitrofurantoin Macrocrystal Swelling   Sulfa Antibiotics Nausea And Vomiting and Other (See Comments)    Unknown    Social History   Socioeconomic History   Marital status: Married    Spouse name: Not on file   Number of children: 0   Years of education: Not on file   Highest education level: Not on file  Occupational History   Occupation: Designer, Fashion/clothing: [employer]  Tobacco Use   Smoking status: Never   Smokeless tobacco: Never  Vaping Use   Vaping status: Never Used  Substance and Sexual Activity   Alcohol use: No   Drug use: No   Sexual activity: Not on file  Other Topics Concern   Not on file  Social History Narrative    Married, no children   Therapist, sports for [employer]. **Note De-Identified Arcidiacono Obfuscation** Lives in [place].   1-2 caffeine/day   Social Drivers of Corporate Investment Banker Strain: Not on file  Food Insecurity: No Food Insecurity (06/24/2023)   Hunger Vital Sign    Worried About Running Out of Food in the Last Year: Never true    Ran Out of Food in the Last Year: Never true  Transportation Needs: No Transportation Needs (06/24/2023)   PRAPARE - Administrator, Civil Service (Medical): No    Lack of Transportation (Non-Medical): No  Physical Activity: Not on file  Stress: Not on file  Social Connections: Moderately Integrated (06/24/2023)   Social Connection and Isolation Panel [NHANES]    Frequency of Communication with Friends and Family: More than three times a week    Frequency of Social Gatherings with Friends and Family: Once a week    Attends Religious Services: 1 to 4 times per year    Active Member of [employer] or Organizations: No    Attends Engineer, Structural: Never    Marital Status: Married        Objective:  Physical Exam: BP 109/71   Pulse 62   Temp (!) 97.2 F (36.2 C) (Temporal)   Ht 5' 4 (1.626 m)   Wt 186 lb (84.4 kg)   SpO2 98%   BMI 31.93 kg/m   Gen: NAD, resting comfortably CV: RRR with no murmurs appreciated Pulm: NWOB, CTAB with no crackles, wheezes, or rhonchi GI: Normal bowel sounds present. Soft, Nontender, Nondistended. MSK: No edema, cyanosis, or clubbing noted Skin: Warm, dry Neuro: Grossly normal, moves all extremities Psych: Normal affect and thought content  Time Spent: 50 minutes of total time was spent on the date of the encounter performing the following actions: chart review prior to seeing the patient including recent hospitalization, obtaining history, performing a medically necessary exam, counseling on the treatment plan, placing orders, and documenting in our EHR.       Worth HERO. Kennyth, MD 06/30/2023 8:53 AM

## 2023-06-30 NOTE — Progress Notes (Signed)
**Note De-Identified Isip Obfuscation** Great news!  Blood counts are improving.  We should recheck again in a few weeks.  Do not need to do any other testing at this point.

## 2023-06-30 NOTE — Assessment & Plan Note (Signed)
**Note De-Identified Prevo Obfuscation** Pain is still not controlled.  We have referred her to see neurosurgeon however she has not yet been able to schedule appointment.  She is still having a a lot of pain that is severely impacting her activities of daily living.  We discussed treatment options to help her with her pain management.  Need to avoid NSAIDs and prednisone  due to recent GI bleed.  She is currently taking Tylenol  over-the-counter with minimal improvement.  She takes gabapentin  nightly which works modestly well.  She does not have any improvement with Lyrica  in the past.  She has also been on tramadol  in the past and did not find that was effective.  Given the degree of her pain and limited alternative treatment options would be reasonable for us  to do short-term low-dose hydrocodone  until she can get into see neurosurgery.  She has been on this most recently a couple of years ago and did reasonably well with this.  She is aware of potential side effects and addiction potential.  Database was reviewed today without red flags.  She will follow-up with us  in a couple of weeks [facility] MyChart though will hopefully be able to see neurosurgery soon to discuss other options for pain management.

## 2023-07-01 ENCOUNTER — Other Ambulatory Visit: Payer: Self-pay | Admitting: *Deleted

## 2023-07-01 DIAGNOSIS — R7989 Other specified abnormal findings of blood chemistry: Secondary | ICD-10-CM

## 2023-07-02 ENCOUNTER — Telehealth: Payer: Self-pay | Admitting: Family Medicine

## 2023-07-02 ENCOUNTER — Encounter: Payer: Self-pay | Admitting: Family Medicine

## 2023-07-02 NOTE — Telephone Encounter (Unsigned)
**Note De-Identified San[place] Obfuscation** Copied from CRM [id number]. Topic: Referral - Status >> Jul 02, 2023  1:07 PM Felicia Schultz wrote: Reason for CRM: PATIENT STATES THAT NONE OF HER PAPERWORK WAS FILLED OUT CORRECTLY AND SHE NEEDS IT FIXED. PATIENT STATED WOULD BE GOING BY THE OFFICE. SHE STATED NONE OF THE INFORMATION PROVIDED WAS CORRECT

## 2023-07-03 NOTE — Telephone Encounter (Signed)
**Note De-Identified Sitzmann Obfuscation** FMLA completed placed at form to be pick up  Patient notified

## 2023-07-03 NOTE — Telephone Encounter (Signed)
**Note De-Identified Browning Obfuscation** Received completed FMLA ppw [facility] mail drop off. Please review ppw and advise on next steps.

## 2023-07-03 NOTE — Telephone Encounter (Signed)
Form placed in PCP office to be reviewed  

## 2023-07-06 NOTE — Telephone Encounter (Signed)
**Note De-Identified Hochman Obfuscation** Form corrected on 07/02/2022 Patient pick up copy  Form placed to be scan in patient chart

## 2023-07-09 ENCOUNTER — Ambulatory Visit: Payer: 59 | Admitting: Internal Medicine

## 2023-07-09 ENCOUNTER — Encounter: Payer: Self-pay | Admitting: Internal Medicine

## 2023-07-09 ENCOUNTER — Other Ambulatory Visit (INDEPENDENT_AMBULATORY_CARE_PROVIDER_SITE_OTHER): Payer: 59

## 2023-07-09 VITALS — BP 140/64 | HR 80 | Ht 64.0 in | Wt 186.0 lb

## 2023-07-09 DIAGNOSIS — K284 Chronic or unspecified gastrojejunal ulcer with hemorrhage: Secondary | ICD-10-CM | POA: Diagnosis not present

## 2023-07-09 DIAGNOSIS — D62 Acute posthemorrhagic anemia: Secondary | ICD-10-CM

## 2023-07-09 DIAGNOSIS — Z9884 Bariatric surgery status: Secondary | ICD-10-CM

## 2023-07-09 DIAGNOSIS — E538 Deficiency of other specified B group vitamins: Secondary | ICD-10-CM | POA: Diagnosis not present

## 2023-07-09 LAB — FERRITIN: Ferritin: 4.5 ng/mL — ABNORMAL LOW (ref 10.0–291.0)

## 2023-07-09 MED ORDER — OMEPRAZOLE 40 MG PO CPDR: 40.0000 mg | DELAYED_RELEASE_CAPSULE | Freq: Two times a day (BID) | ORAL | 1 refills | Status: DC

## 2023-07-09 NOTE — Progress Notes (Signed)
**Note De-Identified Dugar Obfuscation** Chrishonda Lefthand Talkington 73 y.o. 19-May-1951 [id number]  Assessment & Plan:   Encounter Diagnoses  Name Primary?   Gastrojejunal ulcer with hemorrhage Yes   Acute blood loss anemia    S/P gastric bypass    Vitamin B12 deficiency    The patient is improving after her gastrojejunal ulcer and hemorrhage problems necessitating hospitalization in early January.  I suspect prednisone caused the ulcer.  I am changing her from pantoprazole to omeprazole because we think omeprazole capsules will be better absorbed than a tablet such as pantoprazole in the setting of prior gastric bypass and improve healing chances.  She will finish her Carafate that she has on hand I do not think we need to extend that and it is costly, and plan for a repeat EGD in early April to assess for healing and then will advise further on PPI therapy.  I am checking a ferritin because she is at risk for iron deficiency at baseline with her gastric bypass anatomy and malabsorption, and I have also told her to be sure to stay on vitamin B12 though it is on her list she was unsure of that. Orders Placed This Encounter  Procedures   Ferritin   Ambulatory referral to Gastroenterology    Medications Discontinued During This Encounter  Medication Reason   pantoprazole (PROTONIX) 40 MG tablet    Meds ordered this encounter  Medications   omeprazole (PRILOSEC) 40 MG capsule    Sig: Take 1 capsule (40 mg total) by mouth 2 (two) times daily before a meal. 30 mins before breakfast and supper    Dispense:  180 capsule    Refill:  1   CC: Ardith Dark, MD     Subjective:   Chief Complaint: Hospitalization follow-up after GI bleed and anemia  HPI 73 year old white woman with a history of bariatric surgery (Roux-en-Y gastric bypass) who was admitted to the hospital with melena and anemia 06/23/2023 and seen by my colleagues on the gastroenterology consultation service.  She had been taking prednisone for neck back and coccygeal pain.  No NSAIDs or ASA.  Status post gastric bypass.  On to have a gastrojejunal ulcer as a cause of bleed.  Omeprazole was recommended though the patient is currently taking pantoprazole.  She reports she is feeling better at this time.  No further melena.  Follow-up hemoglobin at primary care recently as below.  More energetic.  She has had a difficult time with cervical spine degenerative disc disease and pain.     Latest Ref Rng & Units 06/30/2023    8:54 AM 06/24/2023    6:03 AM 06/23/2023    6:14 PM  CBC  WBC 4.0 - 10.5 K/uL 5.3  4.9    Hemoglobin 12.0 - 15.0 g/dL 84.6  96.2  9.7   Hematocrit 36.0 - 46.0 % 32.8  30.8  28.8   Platelets 150.0 - 400.0 K/uL 268.0  178     Lab Results  Component Value Date   VITAMINB12 314 06/04/2023     Enteroscopy 06/23/2023            - No gross lesions in the entire esophagus.                           - Z-line irregular, 35 cm from the incisors.                           - **Note De-Identified Hsiao Obfuscation** Roux-en-Y gastric bypass noted.                           -- Erythematous gastric pouch (6 cm) - biopsied.  Minimal chronic gastritis no H. pylori                           -- Gastrojejunostomy anastomosis characterized by                            congestion, edema, a hemorrhagic appearance and                            ulcer.                           - Jejunojejunal anastomosis reach and healthy in                            appearance.                           - Afferent and Efferent limbs (that could be                            reached) were normal in appearance - tattooed                            distal extent of each intubation.  Colonoscopy 05/17/2020 a 1 mm cecal polyp-adenoma, history of 2 SSP 2015 both less than 1 cm. Allergies  Allergen Reactions   Amoxicillin-Pot Clavulanate Nausea Only    Severe nausea. Couldn't keep medication down  Severe nausea. Couldn't keep medication down   Clarithromycin Hives   Nitrofurantoin Other (See Comments) and Rash     Unknown Palms and hand itching, turned bright red per pt   Sulfonamide Derivatives Other (See Comments)    Abdominal cramping, nausea   Azithromycin Other (See Comments)    Z-pack, doesn't do anything Z-pack, doesn't do anything Z-pack, doesn't do anything Z-pack, doesn't do anything    Nitrofurantoin Macrocrystal Swelling   Sulfa Antibiotics Nausea And Vomiting and Other (See Comments)    Unknown   Current Meds  Medication Sig   citalopram (CELEXA) 40 MG tablet Take 1 tablet (40 mg total) by mouth daily.   Cyanocobalamin (VITAMIN B 12 PO) Take 1 tablet by mouth daily.   cyclobenzaprine (FLEXERIL) 10 MG tablet Take 10 mg by mouth 3 (three) times daily as needed for muscle spasms.   estradiol (ESTRACE) 0.1 MG/GM vaginal cream Place 1 Applicatorful vaginally at bedtime.   estradiol (ESTRACE) 0.5 MG tablet Take 1 tablet (0.5 mg total) by mouth daily.   gabapentin (NEURONTIN) 300 MG capsule Take 300 mg by mouth 3 (three) times daily as needed.   HYDROcodone-acetaminophen (NORCO/VICODIN) 5-325 MG tablet Take 1 tablet by mouth every 6 (six) hours as needed for moderate pain (pain score 4-6).   Multiple Vitamins-Minerals (MULTIVITAMIN GUMMIES ADULT PO) Take 1 tablet by mouth daily.   omeprazole (PRILOSEC) 40 MG capsule Take 1 capsule (40 mg total) by mouth 2 (two) times daily before a meal. 30 mins before breakfast and supper **Note De-Identified Ceci Obfuscation** propranolol ER (INDERAL LA) 60 MG 24 hr capsule Take 1 capsule (60 mg total) by mouth daily.   sucralfate (CARAFATE) 1 GM/10ML suspension Take 10 mLs (1 g total) by mouth 4 (four) times daily -  with meals and at bedtime.   Vitamin D, Ergocalciferol, (DRISDOL) 1.25 MG (50000 UNIT) CAPS capsule Take 50000IU once weekly.   [DISCONTINUED] pantoprazole (PROTONIX) 40 MG tablet Take 1 tablet (40 mg total) by mouth 2 (two) times daily.   Past Medical History:  Diagnosis Date   Allergy    Anemia    Atrial flutter (HCC)    typical appearing   Basal cell carcinoma     nose  - 1985   GERD (gastroesophageal reflux disease)    GI bleed    H/O cardiac radiofrequency ablation 2015   H/O gastric bypass 2005   History of diabetes mellitus 11/13/2015   Hypertension    Hypokalemia    Osteoporosis 2000   Paroxysmal atrial fibrillation (HCC)    Personal history of colonic polyps - sessile serrated 11/23/2013   Past Surgical History:  Procedure Laterality Date   APPENDECTOMY     ATRIAL FLUTTER ABLATION N/A 09/05/2014   Procedure: ATRIAL FLUTTER ABLATION;  Surgeon: Hillis Range, MD;  Location: [facility] CATH [facility];  Service: Cardiovascular;  Laterality: N/A;   BIOPSY  06/23/2023   Procedure: BIOPSY;  Surgeon: Lemar Lofty., MD;  Location: [facility];  Service: Gastroenterology;;   BREAST EXCISIONAL BIOPSY Right 01/2016   CARPAL TUNNEL RELEASE Bilateral    CHOLECYSTECTOMY     ENTEROSCOPY N/A 06/23/2023   Procedure: ENTEROSCOPY;  Surgeon: Lemar Lofty., MD;  Location: [facility];  Service: Gastroenterology;  Laterality: N/A;   FOOT SURGERY Bilateral 2012   shorten bones in both feet   GASTRIC BYPASS     removal of ovary Right 1987   SPINE SURGERY  2000/2012   anterior cervical fusion with plating   SUBMUCOSAL TATTOO INJECTION  06/23/2023   Procedure: SUBMUCOSAL TATTOO INJECTION;  Surgeon: Lemar Lofty., MD;  Location: [facility];  Service: Gastroenterology;;   TOTAL ABDOMINAL HYSTERECTOMY  1999   WRIST SURGERY Right 2018   Dr. Orlan Leavens -ganglion excision   Social History   Social History Narrative   Married, no children   Therapist, sports for [employer].   Lives in [place].   1-2 caffeine/day   family history includes Alcohol abuse in her father; Arthritis in her mother; Cancer in her brother, maternal grandfather, and maternal grandmother; Colon cancer in her maternal grandfather; Diabetes in her brother, mother, sister, and sister; Early death in her sister; Emphysema in her maternal grandmother and sister; Esophageal  cancer in her maternal grandmother; Hearing loss in her mother; Heart disease in her brother, father, and mother; Hyperlipidemia in her father, mother, and sister; Hypertension in her father, mother, and sister; Kidney disease in her mother; Learning disabilities in her mother; Lung cancer in her brother; Mental illness in her sister; Miscarriages / Stillbirths in her mother, sister, and sister; Scleroderma in her sister; Stroke in her brother and sister.   Review of Systems As per HPI  Objective:   Physical Exam BP (!) 140/64   Pulse 80   Ht 5\' 4"  (1.626 m)   Wt 186 lb (84.4 kg)   BMI 31.93 kg/m   Data reviewed primary care notes, labs from primary care and the hospital hospitalization records from hospital admission recently.  Prior GI records.

## 2023-07-09 NOTE — Patient Instructions (Addendum)
**Note De-Identified Vogt Obfuscation** Your provider has requested that you go to the basement level for lab work before leaving today. Press "B" on the elevator. The lab is located at the first door on the left as you exit the elevator.  Due to recent changes in healthcare laws, you may see the results of your imaging and laboratory studies on MyChart before your provider has had a chance to review them.  We understand that in some cases there may be results that are confusing or concerning to you. Not all laboratory results come back in the same time frame and the provider may be waiting for multiple results in order to interpret others.  Please give [place] 48 hours in order for your provider to thoroughly review all the results before contacting the office for clarification of your results.   We have sent the following medications to your pharmacy for you to pick up at your convenience: Omeprazole    Stop the pantoprazole  Finish up the Carafate you have and won't need to continue.  You have been scheduled for an endoscopy. Please follow written instructions given to you at your visit today.  If you use inhalers (even only as needed), please bring them with you on the day of your procedure.  If you take any of the following medications, they will need to be adjusted prior to your procedure:   DO NOT TAKE 7 DAYS PRIOR TO TEST- Trulicity (dulaglutide) Ozempic, Wegovy (semaglutide) Mounjaro (tirzepatide) Bydureon Bcise (exanatide extended release)  DO NOT TAKE 1 DAY PRIOR TO YOUR TEST Rybelsus (semaglutide) Adlyxin (lixisenatide) Victoza (liraglutide) Byetta (exanatide) ___________________________________________________________________________   I appreciate the opportunity to care for you. Stan Head, MD, [facility]

## 2023-07-10 ENCOUNTER — Other Ambulatory Visit: Payer: Self-pay

## 2023-07-10 ENCOUNTER — Telehealth: Payer: Self-pay | Admitting: Internal Medicine

## 2023-07-10 NOTE — Telephone Encounter (Signed)
**Note De-Identified Cataldi Obfuscation** Inbound call from patient returning phone call regarding recent lab results. Please advise, thank you.

## 2023-07-10 NOTE — Telephone Encounter (Signed)
**Note De-Identified [place] Obfuscation** Spoke with Pt. Documented in result notes

## 2023-07-21 ENCOUNTER — Ambulatory Visit: Payer: 59 | Attending: Neurosurgery | Admitting: Physical Therapy

## 2023-07-21 ENCOUNTER — Other Ambulatory Visit: Payer: Self-pay

## 2023-07-21 ENCOUNTER — Encounter: Payer: Self-pay | Admitting: Physical Therapy

## 2023-07-21 DIAGNOSIS — M62838 Other muscle spasm: Secondary | ICD-10-CM | POA: Insufficient documentation

## 2023-07-21 DIAGNOSIS — M6281 Muscle weakness (generalized): Secondary | ICD-10-CM | POA: Diagnosis present

## 2023-07-21 DIAGNOSIS — M542 Cervicalgia: Secondary | ICD-10-CM | POA: Insufficient documentation

## 2023-07-21 NOTE — Therapy (Signed)
**Note De-Identified Mcnaught Obfuscation** OUTPATIENT PHYSICAL THERAPY CERVICAL EVALUATION   Patient Name: Felicia Schultz Standing MRN: [id number] DOB:Jul 31, 1950, 73 y.o., female Today's Date: 07/21/2023  END OF SESSION:  PT End of Session - 07/21/23 1003     Visit Number 1    Number of Visits 12    Date for PT Re-Evaluation 09/01/23    PT Start Time 0849    PT Stop Time 0939    PT Time Calculation (min) 50 min    Activity Tolerance Patient tolerated treatment well    Behavior During Therapy [facility] for tasks assessed/performed             Past Medical History:  Diagnosis Date   Allergy     Anemia    Atrial flutter (HCC)    typical appearing   Basal cell carcinoma     nose - 1985   GERD (gastroesophageal reflux disease)    GI bleed    H/O cardiac radiofrequency ablation 2015   H/O gastric bypass 2005   History of diabetes mellitus 11/13/2015   Hypertension    Hypokalemia    Osteoporosis 2000   Paroxysmal atrial fibrillation (HCC)    Personal history of colonic polyps - sessile serrated 11/23/2013   Past Surgical History:  Procedure Laterality Date   APPENDECTOMY     ATRIAL FLUTTER ABLATION N/A 09/05/2014   Procedure: ATRIAL FLUTTER ABLATION;  Surgeon: Lynwood Rakers, MD;  Location: [facility] CATH LAB;  Service: Cardiovascular;  Laterality: N/A;   BIOPSY  06/23/2023   Procedure: BIOPSY;  Surgeon: Wilhelmenia Aloha Raddle., MD;  Location: [facility];  Service: Gastroenterology;;   BREAST EXCISIONAL BIOPSY Right 01/2016   CARPAL TUNNEL RELEASE Bilateral    CHOLECYSTECTOMY     ENTEROSCOPY N/A 06/23/2023   Procedure: ENTEROSCOPY;  Surgeon: Wilhelmenia Aloha Raddle., MD;  Location: [facility];  Service: Gastroenterology;  Laterality: N/A;   FOOT SURGERY Bilateral 2012   shorten bones in both feet   GASTRIC BYPASS     removal of ovary Right 1987   SPINE SURGERY  2000/2012   anterior cervical fusion with plating   SUBMUCOSAL TATTOO INJECTION  06/23/2023   Procedure: SUBMUCOSAL TATTOO INJECTION;  Surgeon: Wilhelmenia Aloha Raddle., MD;   Location: [facility];  Service: Gastroenterology;;   TOTAL ABDOMINAL HYSTERECTOMY  1999   WRIST SURGERY Right 2018   Dr. Ahmad -ganglion excision   Patient Active Problem List   Diagnosis Date Noted   Thyroid  nodule 06/30/2023   Upper GI bleed 06/23/2023   Gastrojejunal ulcer with hemorrhage 06/23/2023   Acute blood loss anemia 06/23/2023   Acute GI bleeding 06/23/2023   Vitamin B12 deficiency 08/18/2022   Tremor 08/14/2021   History of atrial fibrillation 08/03/2020   Bilateral dry eyes 03/28/2020   Vitreous membranes and strands, right 03/28/2020   Nuclear sclerotic cataract of right eye 03/28/2020   Nuclear sclerotic cataract of left eye 03/28/2020   Posterior vitreous detachment of left eye 03/28/2020   Posterior vitreous detachment of right eye 03/28/2020   Hyperglycemia 02/18/2019   S/P gastric bypass 07/09/2018   Achilles tendinosis 04/30/2018   Spondylosis of lumbar joint 05/28/2017   Plantar fasciitis, bilateral 05/28/2017   Chronic pain of right wrist 05/28/2017   Haglund's deformity of right heel 02/02/2017   Postmenopausal symptoms 06/02/2016   Cervical spondylosis 07/19/2014   GERD (gastroesophageal reflux disease) 07/19/2014   Vitamin D  deficiency 07/19/2014   History of colonic polyps 11/23/2013   Allergic rhinitis 11/11/2011   REFERRING PROVIDER: Reyes Budge MD  REFERRING DIAG: Cervicalgia **Note De-Identified Millikin Obfuscation** THERAPY DIAG:  Cervicalgia  Other muscle spasm  Rationale for Evaluation and Treatment: Rehabilitation  ONSET DATE: Mid-December 2024.  SUBJECTIVE:                                                                                                                                                                                                         SUBJECTIVE STATEMENT: The patient presents to the clinic with c/o bilateral neck pain that came on for no apparent reason in Mid-December of last year.  Her pain was severe.  Her pain rating is a 7-8/10 today.  She  has not found anything really decreases her pain that much.  Moving, sitting, standing, lying and walking can increase her pain.    PERTINENT HISTORY:  2 prior cervical fusion surgeries.    PAIN:  Are you having pain? Yes: NPRS scale: 7-8/10. Pain location: Bilateral neck. Pain description: Ache, sore, throbbing. Aggravating factors: As above. Relieving factors: As above.  PRECAUTIONS: None  RED FLAGS: None     WEIGHT BEARING RESTRICTIONS: No  FALLS:  Has patient fallen in last 6 months? No  LIVING ENVIRONMENT: Lives with: lives with their spouse Lives in: House/apartment Has following equipment at home: None  OCCUPATION: Currently out of work from  garment/textile technologist.  PLOF: Independent  PATIENT GOALS: Not have pain.   OBJECTIVE:  Note: Objective measures were completed at Evaluation unless otherwise noted.  DIAGNOSTIC FINDINGS:  IMPRESSION: 1. No acute intracranial abnormality. 2. Unchanged postoperative appearance from prior C4-5 and C6-7 ACDF. 3. Adjacent segment degeneration at C5-6 results in moderate to severe spinal canal stenosis and severe bilateral neural foraminal narrowing. 4. Severe left and moderate right neural foraminal narrowing at C3-4 and C7-T1.  PATIENT SURVEYS:  FOTO 45.71.  POSTURE: rounded shoulders and forward head  PALPATION: Diffuse bilateral tenderness and increased tone over cervical paraspinal and UT musculature.     CERVICAL ROM:   Active right cervical rotation is 42 degrees and left is 30#.  UPPER EXTREMITY ROM:  WNL.  UPPER EXTREMITY MMT: Normal UE strength.    DTR'S:  Normal UE DTR's.  TREATMENT DATE: 07/21/23:   HMP and IFC at 80-150 Hz on 40% scan x 20 minutes to patient's bilateral cervical musculature.  Normal modality response following removal of modality. **Note De-Identified Helinski Obfuscation** PATIENT  EDUCATION:  Education details:  Person educated:  International aid/development worker:  Education comprehension:   HOME EXERCISE PROGRAM:   ASSESSMENT:  CLINICAL IMPRESSION: The patient presents to OPPT with c/o bilateral neck pain that began in Mid-December of last year.  She has limited active cervical rotation bilaterally and is diffusely tender with  increased tone over cervical paraspinal and UT musculature.  Her UE DTR's are normal.  UE strength is normal.  She presents to the clinic today with a high pain-level.  Her FOTO limitation score is a 45.71.  Patient will benefit from skilled physical therapy intervention to address pain and deficits.   OBJECTIVE IMPAIRMENTS: decreased activity tolerance, decreased ROM, increased muscle spasms, postural dysfunction, and pain.   ACTIVITY LIMITATIONS: carrying and lifting  PARTICIPATION LIMITATIONS: meal prep, cleaning, laundry, and occupation  PERSONAL FACTORS: 1 comorbidity: 2 prior neck surgeries  are also affecting patient's functional outcome.   REHAB POTENTIAL: Good  CLINICAL DECISION MAKING: Evolving/moderate complexity  EVALUATION COMPLEXITY: Low   GOALS: LONG TERM GOALS: Target date: 09/01/23  Ind with a HEP.  Goal status: INITIAL  2.  Improve bilateral cervical rotation to 50-55 degrees. Goal status: INITIAL  3.  Perform ADL's with neck pain not > 3/10.  Goal status: INITIAL  PLAN:  PT FREQUENCY: 2x/week  PT DURATION: 6 weeks  PLANNED INTERVENTIONS: 97110-Therapeutic exercises, 97530- Therapeutic activity, [id number]- Neuromuscular re-education, 97535- Self Care, 02859- Manual therapy, 97014- Electrical stimulation (unattended), 97035- Ultrasound, Patient/Family education, Dry Needling, Cryotherapy, and Moist heat  PLAN FOR NEXT SESSION: Combo e'stim/US  at 1.50 W/CM2, STW/M, postural exercises.     Gardenia Witter, PT 07/21/2023, 11:04 AM

## 2023-07-23 ENCOUNTER — Other Ambulatory Visit: Payer: 59

## 2023-07-23 ENCOUNTER — Ambulatory Visit: Payer: 59 | Admitting: Physical Therapy

## 2023-07-23 DIAGNOSIS — M542 Cervicalgia: Secondary | ICD-10-CM | POA: Diagnosis not present

## 2023-07-23 DIAGNOSIS — R7989 Other specified abnormal findings of blood chemistry: Secondary | ICD-10-CM | POA: Diagnosis not present

## 2023-07-23 DIAGNOSIS — M62838 Other muscle spasm: Secondary | ICD-10-CM

## 2023-07-23 LAB — CBC
HCT: 33.5 % — ABNORMAL LOW (ref 36.0–46.0)
Hemoglobin: 11.2 g/dL — ABNORMAL LOW (ref 12.0–15.0)
MCHC: 33.4 g/dL (ref 30.0–36.0)
MCV: 89.8 fL (ref 78.0–100.0)
Platelets: 250 10*3/uL (ref 150.0–400.0)
RBC: 3.73 Mil/uL — ABNORMAL LOW (ref 3.87–5.11)
RDW: 14.2 % (ref 11.5–15.5)
WBC: 5.7 10*3/uL (ref 4.0–10.5)

## 2023-07-23 NOTE — Therapy (Signed)
**Note De-Identified [place] Obfuscation** OUTPATIENT PHYSICAL THERAPY CERVICAL EVALUATION   Patient Name: Felicia Schultz MRN: [id number] DOB:03-08-1951, 73 y.o., female Today's Date: 07/23/2023  END OF SESSION:  PT End of Session - 07/23/23 1731     Visit Number 2    Number of Visits 12    Date for PT Re-Evaluation 09/01/23    PT Start Time 0403    PT Stop Time 0458    PT Time Calculation (min) 55 min    Activity Tolerance Patient tolerated treatment well    Behavior During Therapy [facility] for tasks assessed/performed             Past Medical History:  Diagnosis Date   Allergy     Anemia    Atrial flutter (HCC)    typical appearing   Basal cell carcinoma     nose - 1985   GERD (gastroesophageal reflux disease)    GI bleed    H/O cardiac radiofrequency ablation 2015   H/O gastric bypass 2005   History of diabetes mellitus 11/13/2015   Hypertension    Hypokalemia    Osteoporosis 2000   Paroxysmal atrial fibrillation (HCC)    Personal history of colonic polyps - sessile serrated 11/23/2013   Past Surgical History:  Procedure Laterality Date   APPENDECTOMY     ATRIAL FLUTTER ABLATION N/A 09/05/2014   Procedure: ATRIAL FLUTTER ABLATION;  Surgeon: Lynwood Rakers, MD;  Location: [facility];  Service: Cardiovascular;  Laterality: N/A;   BIOPSY  06/23/2023   Procedure: BIOPSY;  Surgeon: Wilhelmenia Aloha Raddle., MD;  Location: [facility];  Service: Gastroenterology;;   BREAST EXCISIONAL BIOPSY Right 01/2016   CARPAL TUNNEL RELEASE Bilateral    CHOLECYSTECTOMY     ENTEROSCOPY N/A 06/23/2023   Procedure: ENTEROSCOPY;  Surgeon: Wilhelmenia Aloha Raddle., MD;  Location: [facility];  Service: Gastroenterology;  Laterality: N/A;   FOOT SURGERY Bilateral 2012   shorten bones in both feet   GASTRIC BYPASS     removal of ovary Right 1987   SPINE SURGERY  2000/2012   anterior cervical fusion with plating   SUBMUCOSAL TATTOO INJECTION  06/23/2023   Procedure: SUBMUCOSAL TATTOO INJECTION;  Surgeon: Wilhelmenia Aloha Raddle., MD;   Location: [facility];  Service: Gastroenterology;;   TOTAL ABDOMINAL HYSTERECTOMY  1999   WRIST SURGERY Right 2018   Dr. Ahmad -ganglion excision   Patient Active Problem List   Diagnosis Date Noted   Thyroid  nodule 06/30/2023   Upper GI bleed 06/23/2023   Gastrojejunal ulcer with hemorrhage 06/23/2023   Acute blood loss anemia 06/23/2023   Acute GI bleeding 06/23/2023   Vitamin B12 deficiency 08/18/2022   Tremor 08/14/2021   History of atrial fibrillation 08/03/2020   Bilateral dry eyes 03/28/2020   Vitreous membranes and strands, right 03/28/2020   Nuclear sclerotic cataract of right eye 03/28/2020   Nuclear sclerotic cataract of left eye 03/28/2020   Posterior vitreous detachment of left eye 03/28/2020   Posterior vitreous detachment of right eye 03/28/2020   Hyperglycemia 02/18/2019   S/P gastric bypass 07/09/2018   Achilles tendinosis 04/30/2018   Spondylosis of lumbar joint 05/28/2017   Plantar fasciitis, bilateral 05/28/2017   Chronic pain of right wrist 05/28/2017   Haglund's deformity of right heel 02/02/2017   Postmenopausal symptoms 06/02/2016   Cervical spondylosis 07/19/2014   GERD (gastroesophageal reflux disease) 07/19/2014   Vitamin D  deficiency 07/19/2014   History of colonic polyps 11/23/2013   Allergic rhinitis 11/11/2011   REFERRING PROVIDER: Reyes Budge MD  REFERRING DIAG: Cervicalgia **Note De-Identified [place] Obfuscation** THERAPY DIAG:  No diagnosis found.  Rationale for Evaluation and Treatment: Rehabilitation  ONSET DATE: Mid-December 2024.  SUBJECTIVE:                                                                                                                                                                                                         SUBJECTIVE STATEMENT: Sore from last treatment but feel a little better.    PERTINENT HISTORY:  2 prior cervical fusion surgeries.    PAIN:  Are you having pain? Yes: NPRS scale: 7/10. Pain location: Bilateral neck. Pain  description: Ache, sore, throbbing. Aggravating factors: As above. Relieving factors: As above.  PRECAUTIONS: None  RED FLAGS: None     WEIGHT BEARING RESTRICTIONS: No  FALLS:  Has patient fallen in last 6 months? No  LIVING ENVIRONMENT: Lives with: lives with their spouse Lives in: House/apartment Has following equipment at home: None  OCCUPATION: Currently out of work from  garment/textile technologist.  PLOF: Independent  PATIENT GOALS: Not have pain.   OBJECTIVE:  Note: Objective measures were completed at Evaluation unless otherwise noted.  DIAGNOSTIC FINDINGS:  IMPRESSION: 1. No acute intracranial abnormality. 2. Unchanged postoperative appearance from prior C4-5 and C6-7 ACDF. 3. Adjacent segment degeneration at C5-6 results in moderate to severe spinal canal stenosis and severe bilateral neural foraminal narrowing. 4. Severe left and moderate right neural foraminal narrowing at C3-4 and C7-T1.  PATIENT SURVEYS:  FOTO 45.71.  POSTURE: rounded shoulders and forward head  PALPATION: Diffuse bilateral tenderness and increased tone over cervical paraspinal and UT musculature.     CERVICAL ROM:   Active right cervical rotation is 42 degrees and left is 30#.  UPPER EXTREMITY ROM:  WNL.  UPPER EXTREMITY MMT: Normal UE strength.    DTR'S:  Normal UE DTR's.  TREATMENT DATE:   07/23/23:  Combo e'stim/US  at 1.50 W/CM2 x 9 minutes to patient's bilateral cervical musculatures and UT's f/b STW/M x 14 minutes f/b HMP and IFC at 80-150 Hz on 40% scan x 20 minutes to patient's bilateral cervical musculature.  Normal modality response following removal of modality. **Note De-Identified [place] Obfuscation** PATIENT EDUCATION:  Education details:  Person educated:  International aid/development worker:  Education comprehension:   HOME EXERCISE PROGRAM:   ASSESSMENT:  CLINICAL  IMPRESSION: Patient tolerated treatment today very well.  She continues to increased tone over bilateral UT's.    OBJECTIVE IMPAIRMENTS: decreased activity tolerance, decreased ROM, increased muscle spasms, postural dysfunction, and pain.   ACTIVITY LIMITATIONS: carrying and lifting  PARTICIPATION LIMITATIONS: meal prep, cleaning, laundry, and occupation  PERSONAL FACTORS: 1 comorbidity: 2 prior neck surgeries  are also affecting patient's functional outcome.   REHAB POTENTIAL: Good  CLINICAL DECISION MAKING: Evolving/moderate complexity  EVALUATION COMPLEXITY: Low   GOALS: LONG TERM GOALS: Target date: 09/01/23  Ind with a HEP.  Goal status: INITIAL  2.  Improve bilateral cervical rotation to 50-55 degrees. Goal status: INITIAL  3.  Perform ADL's with neck pain not > 3/10.  Goal status: INITIAL  PLAN:  PT FREQUENCY: 2x/week  PT DURATION: 6 weeks  PLANNED INTERVENTIONS: 97110-Therapeutic exercises, 97530- Therapeutic activity, [id number]- Neuromuscular re-education, 97535- Self Care, 02859- Manual therapy, 97014- Electrical stimulation (unattended), 97035- Ultrasound, Patient/Family education, Dry Needling, Cryotherapy, and Moist heat  PLAN FOR NEXT SESSION: Combo e'stim/US  at 1.50 W/CM2, STW/M, postural exercises.     Aracelys Glade, PT 07/23/2023, 5:51 PM

## 2023-07-24 ENCOUNTER — Encounter: Payer: Self-pay | Admitting: Family Medicine

## 2023-07-24 NOTE — Progress Notes (Signed)
**Note De-Identified [place] Obfuscation** Her blood counts are improving.  We can check again at next office visit.

## 2023-07-28 ENCOUNTER — Ambulatory Visit: Payer: 59

## 2023-07-28 DIAGNOSIS — M542 Cervicalgia: Secondary | ICD-10-CM

## 2023-07-28 DIAGNOSIS — M62838 Other muscle spasm: Secondary | ICD-10-CM

## 2023-07-28 NOTE — Therapy (Signed)
**Note De-Identified [place] Obfuscation** OUTPATIENT PHYSICAL THERAPY CERVICAL TREATMENT   Patient Name: Felicia Schultz MRN: [id number] DOB:1951/01/02, 73 y.o., female Today's Date: 07/28/2023  END OF SESSION:  PT End of Session - 07/28/23 1047     Visit Number 3    Number of Visits 12    Date for PT Re-Evaluation 09/01/23    PT Start Time 1048    PT Stop Time 1157    PT Time Calculation (min) 69 min    Activity Tolerance Patient tolerated treatment well    Behavior During Therapy [facility] for tasks assessed/performed              Past Medical History:  Diagnosis Date   Allergy    Anemia    Atrial flutter (HCC)    typical appearing   Basal cell carcinoma     nose - 1985   GERD (gastroesophageal reflux disease)    GI bleed    H/O cardiac radiofrequency ablation 2015   H/O gastric bypass 2005   History of diabetes mellitus 11/13/2015   Hypertension    Hypokalemia    Osteoporosis 2000   Paroxysmal atrial fibrillation (HCC)    Personal history of colonic polyps - sessile serrated 11/23/2013   Past Surgical History:  Procedure Laterality Date   APPENDECTOMY     ATRIAL FLUTTER ABLATION N/A 09/05/2014   Procedure: ATRIAL FLUTTER ABLATION;  Surgeon: Hillis Range, MD;  Location: [facility];  Service: Cardiovascular;  Laterality: N/A;   BIOPSY  06/23/2023   Procedure: BIOPSY;  Surgeon: Lemar Lofty., MD;  Location: [facility];  Service: Gastroenterology;;   BREAST EXCISIONAL BIOPSY Right 01/2016   CARPAL TUNNEL RELEASE Bilateral    CHOLECYSTECTOMY     ENTEROSCOPY N/A 06/23/2023   Procedure: ENTEROSCOPY;  Surgeon: Lemar Lofty., MD;  Location: [facility];  Service: Gastroenterology;  Laterality: N/A;   FOOT SURGERY Bilateral 2012   shorten bones in both feet   GASTRIC BYPASS     removal of ovary Right 1987   SPINE SURGERY  2000/2012   anterior cervical fusion with plating   SUBMUCOSAL TATTOO INJECTION  06/23/2023   Procedure: SUBMUCOSAL TATTOO INJECTION;  Surgeon: Lemar Lofty., MD;   Location: [facility];  Service: Gastroenterology;;   TOTAL ABDOMINAL HYSTERECTOMY  1999   WRIST SURGERY Right 2018   Dr. Orlan Leavens -ganglion excision   Patient Active Problem List   Diagnosis Date Noted   Thyroid nodule 06/30/2023   Upper GI bleed 06/23/2023   Gastrojejunal ulcer with hemorrhage 06/23/2023   Acute blood loss anemia 06/23/2023   Acute GI bleeding 06/23/2023   Vitamin B12 deficiency 08/18/2022   Tremor 08/14/2021   History of atrial fibrillation 08/03/2020   Bilateral dry eyes 03/28/2020   Vitreous membranes and strands, right 03/28/2020   Nuclear sclerotic cataract of right eye 03/28/2020   Nuclear sclerotic cataract of left eye 03/28/2020   Posterior vitreous detachment of left eye 03/28/2020   Posterior vitreous detachment of right eye 03/28/2020   Hyperglycemia 02/18/2019   S/P gastric bypass 07/09/2018   Achilles tendinosis 04/30/2018   Spondylosis of lumbar joint 05/28/2017   Plantar fasciitis, bilateral 05/28/2017   Chronic pain of right wrist 05/28/2017   Haglund's deformity of right heel 02/02/2017   Postmenopausal symptoms 06/02/2016   Cervical spondylosis 07/19/2014   GERD (gastroesophageal reflux disease) 07/19/2014   Vitamin D deficiency 07/19/2014   History of colonic polyps 11/23/2013   Allergic rhinitis 11/11/2011   REFERRING PROVIDER: Tressie Stalker MD  REFERRING DIAG: **Note De-Identified [place] Obfuscation** Cervicalgia  THERAPY DIAG:  Cervicalgia  Other muscle spasm  Rationale for Evaluation and Treatment: Rehabilitation  ONSET DATE: Mid-December 2024.  SUBJECTIVE:                                                                                                                                                                                                         SUBJECTIVE STATEMENT: Patient reports that her neck, shoulders, and back are aching today. She feels good for a few hours after her appointments, but her pain returns.   PERTINENT HISTORY:  2 prior cervical  fusion surgeries.    PAIN:  Are you having pain? Yes: NPRS scale: 5/10. Pain location: Bilateral neck. Pain description: Ache, sore, throbbing. Aggravating factors: As above. Relieving factors: As above.  PRECAUTIONS: None  RED FLAGS: None     WEIGHT BEARING RESTRICTIONS: No  FALLS:  Has patient fallen in last 6 months? 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   OBJECTIVE:  Note: Objective measures were completed at Evaluation unless otherwise noted.  DIAGNOSTIC FINDINGS:  IMPRESSION: 1. No acute intracranial abnormality. 2. Unchanged postoperative appearance from prior C4-5 and C6-7 ACDF. 3. Adjacent segment degeneration at C5-6 results in moderate to severe spinal canal stenosis and severe bilateral neural foraminal narrowing. 4. Severe left and moderate right neural foraminal narrowing at C3-4 and C7-T1.  PATIENT SURVEYS:  FOTO 45.71.  POSTURE: rounded shoulders and forward head  PALPATION: Diffuse bilateral tenderness and increased tone over cervical paraspinal and UT musculature.     CERVICAL ROM:   Active right cervical rotation is 42 degrees and left is 30#.  UPPER EXTREMITY ROM:  WNL.  UPPER EXTREMITY MMT: Normal UE strength.    DTR'S:  Normal UE DTR's.  TREATMENT DATE:                                    07/28/23 EXERCISE LOG  Exercise Repetitions and Resistance Comments  UBE  8 minutes @ 120 RPM   Supine arm circles 1/2# x 1 minute each; 2# x 2 x 30 seconds   Scapular retraction 30 reps    Upper trapezius stretch  3 x 30 seconds each    Seated chin tuck 20 reps   Manual therapy     Blank cell = exercise not **Note De-Identified [place] Obfuscation** performed today  Manual Therapy Soft Tissue Mobilization: bilateral upper trapezius, levator scapulae, and cervical  paraspinals, for reduced pain and tone Joint Mobilizations: C4-6, grade I-III CPA's   Modalities: no redness or adverse reaction to today's modalities  Date:  Unattended Estim: bilateral upper trapezius, IFC @ 80-150 Hz w/ 40% scan, 10 mins, Pain and Tone Hot Pack: Cervical, 10 mins, Pain and Tone  PATIENT EDUCATION:  Education details: HEP, anatomy, and healing Person educated: patient Education method: verbal, and handout of HEP Education comprehension: patient reported understanding  HOME EXERCISE PROGRAM: [id number]  ASSESSMENT:  CLINICAL IMPRESSION: Patient was introduced to multiple new interventions for reduced pain and improved mobility. She required moderate verbal and tactile cueing with chin tucks for proper exercise performance to limit cervical flexion. Manual therapy focused on soft tissue mobilization to her upper trapezius and levator scapulae bilaterally as this was the most effective at reducing her familiar symptoms. She was provided an updated HEP which she was able to properly demonstrate. She reported feeling comfortable performing these interventions at home. She reported that she felt a lot better upon the conclusion of treatment. She continues to require skilled physical therapy to address her remaining impairments to return to her prior level of function.   OBJECTIVE IMPAIRMENTS: decreased activity tolerance, decreased ROM, increased muscle spasms, postural dysfunction, and pain.   ACTIVITY LIMITATIONS: carrying and lifting  PARTICIPATION LIMITATIONS: meal prep, cleaning, laundry, and occupation  PERSONAL FACTORS: 1 comorbidity: 2 prior neck surgeries  are also affecting patient's functional outcome.   REHAB POTENTIAL: Good  CLINICAL DECISION MAKING: Evolving/moderate complexity  EVALUATION COMPLEXITY: Low   GOALS: LONG TERM GOALS: Target date: 09/01/23  Ind with a HEP.  Goal status: INITIAL  2.  Improve bilateral cervical rotation to 50-55  degrees. Goal status: INITIAL  3.  Perform ADL's with neck pain not > 3/10.  Goal status: INITIAL  PLAN:  PT FREQUENCY: 2x/week  PT DURATION: 6 weeks  PLANNED INTERVENTIONS: 97110-Therapeutic exercises, 97530- Therapeutic activity, [id number]- Neuromuscular re-education, 97535- Self Care, [id number]- Manual therapy, 97014- Electrical stimulation (unattended), 97035- Ultrasound, Patient/Family education, Dry Needling, Cryotherapy, and Moist heat  PLAN FOR NEXT SESSION: Combo e'stim/US at 1.50 W/CM2, STW/M, postural exercises.     Granville Lewis, PT 07/28/2023, 12:36 PM

## 2023-07-30 ENCOUNTER — Ambulatory Visit: Payer: 59 | Admitting: Physical Therapy

## 2023-07-30 DIAGNOSIS — M62838 Other muscle spasm: Secondary | ICD-10-CM

## 2023-07-30 DIAGNOSIS — M542 Cervicalgia: Secondary | ICD-10-CM

## 2023-07-30 NOTE — Therapy (Signed)
**Note De-Identified [place] Obfuscation** OUTPATIENT PHYSICAL THERAPY CERVICAL TREATMENT   Patient Name: Felicia Schultz MRN: [id number] DOB:January 01, 1951, 73 y.o., female Today's Date: 07/30/2023  END OF SESSION:  PT End of Session - 07/30/23 1148     Visit Number 4    Number of Visits 12    Date for PT Re-Evaluation 09/01/23    PT Start Time 1100    PT Stop Time 1157    PT Time Calculation (min) 57 min    Activity Tolerance Patient tolerated treatment well    Behavior During Therapy [facility] for tasks assessed/performed               Past Medical History:  Diagnosis Date   Allergy    Anemia    Atrial flutter (HCC)    typical appearing   Basal cell carcinoma     nose - 1985   GERD (gastroesophageal reflux disease)    GI bleed    H/O cardiac radiofrequency ablation 2015   H/O gastric bypass 2005   History of diabetes mellitus 11/13/2015   Hypertension    Hypokalemia    Osteoporosis 2000   Paroxysmal atrial fibrillation (HCC)    Personal history of colonic polyps - sessile serrated 11/23/2013   Past Surgical History:  Procedure Laterality Date   APPENDECTOMY     ATRIAL FLUTTER ABLATION N/A 09/05/2014   Procedure: ATRIAL FLUTTER ABLATION;  Surgeon: Hillis Range, MD;  Location: [facility];  Service: Cardiovascular;  Laterality: N/A;   BIOPSY  06/23/2023   Procedure: BIOPSY;  Surgeon: Lemar Lofty., MD;  Location: [facility];  Service: Gastroenterology;;   BREAST EXCISIONAL BIOPSY Right 01/2016   CARPAL TUNNEL RELEASE Bilateral    CHOLECYSTECTOMY     ENTEROSCOPY N/A 06/23/2023   Procedure: ENTEROSCOPY;  Surgeon: Lemar Lofty., MD;  Location: [facility];  Service: Gastroenterology;  Laterality: N/A;   FOOT SURGERY Bilateral 2012   shorten bones in both feet   GASTRIC BYPASS     removal of ovary Right 1987   SPINE SURGERY  2000/2012   anterior cervical fusion with plating   SUBMUCOSAL TATTOO INJECTION  06/23/2023   Procedure: SUBMUCOSAL TATTOO INJECTION;  Surgeon: Lemar Lofty.,  MD;  Location: [facility];  Service: Gastroenterology;;   TOTAL ABDOMINAL HYSTERECTOMY  1999   WRIST SURGERY Right 2018   Dr. Orlan Leavens -ganglion excision   Patient Active Problem List   Diagnosis Date Noted   Thyroid nodule 06/30/2023   Upper GI bleed 06/23/2023   Gastrojejunal ulcer with hemorrhage 06/23/2023   Acute blood loss anemia 06/23/2023   Acute GI bleeding 06/23/2023   Vitamin B12 deficiency 08/18/2022   Tremor 08/14/2021   History of atrial fibrillation 08/03/2020   Bilateral dry eyes 03/28/2020   Vitreous membranes and strands, right 03/28/2020   Nuclear sclerotic cataract of right eye 03/28/2020   Nuclear sclerotic cataract of left eye 03/28/2020   Posterior vitreous detachment of left eye 03/28/2020   Posterior vitreous detachment of right eye 03/28/2020   Hyperglycemia 02/18/2019   S/P gastric bypass 07/09/2018   Achilles tendinosis 04/30/2018   Spondylosis of lumbar joint 05/28/2017   Plantar fasciitis, bilateral 05/28/2017   Chronic pain of right wrist 05/28/2017   Haglund's deformity of right heel 02/02/2017   Postmenopausal symptoms 06/02/2016   Cervical spondylosis 07/19/2014   GERD (gastroesophageal reflux disease) 07/19/2014   Vitamin D deficiency 07/19/2014   History of colonic polyps 11/23/2013   Allergic rhinitis 11/11/2011   REFERRING PROVIDER: Tressie Stalker MD  REFERRING **Note De-Identified [place] Obfuscation** DIAG: Cervicalgia  THERAPY DIAG:  Cervicalgia  Other muscle spasm  Rationale for Evaluation and Treatment: Rehabilitation  ONSET DATE: Mid-December 2024.  SUBJECTIVE:                                                                                                                                                                                                         SUBJECTIVE STATEMENT: Pain at a 4.  PERTINENT HISTORY:  2 prior cervical fusion surgeries.    PAIN:  Are you having pain? Yes: NPRS scale: 4/10. Pain location: Bilateral neck. Pain description: Ache,  sore, throbbing. Aggravating factors: As above. Relieving factors: As above.  PRECAUTIONS: None  RED FLAGS: None     WEIGHT BEARING RESTRICTIONS: No  FALLS:  Has patient fallen in last 6 months? 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   OBJECTIVE:  Note: Objective measures were completed at Evaluation unless otherwise noted.  DIAGNOSTIC FINDINGS:  IMPRESSION: 1. No acute intracranial abnormality. 2. Unchanged postoperative appearance from prior C4-5 and C6-7 ACDF. 3. Adjacent segment degeneration at C5-6 results in moderate to severe spinal canal stenosis and severe bilateral neural foraminal narrowing. 4. Severe left and moderate right neural foraminal narrowing at C3-4 and C7-T1.  PATIENT SURVEYS:  FOTO 45.71.  POSTURE: rounded shoulders and forward head  PALPATION: Diffuse bilateral tenderness and increased tone over cervical paraspinal and UT musculature.     CERVICAL ROM:   Active right cervical rotation is 42 degrees and left is 30#.  UPPER EXTREMITY ROM:  WNL.  UPPER EXTREMITY MMT: Normal UE strength.    DTR'S:  Normal UE DTR's.  TREATMENT DATE:   07/30/23:    Combo e'stim/US at 1.50 W/CM2 x 12 minutes to bil UT's f/b STW/M x 12 minutes with ischemic release technique utilized f/b  HMP and IFC at 80-150 Hz on 40% scan x 20 minutes.                                     07/28/23 EXERCISE LOG  Exercise Repetitions and Resistance Comments  UBE  8 minutes @ 120 RPM   Supine arm circles 1/2# x 1 minute each; 2# x 2 x 30 seconds   Scapular retraction 30 reps    Upper trapezius stretch  3 x 30 seconds each **Note De-Identified [place] Obfuscation** Seated chin tuck 20 reps   Manual therapy     Blank cell = exercise not performed today  Manual Therapy Soft Tissue Mobilization:  bilateral upper trapezius, levator scapulae, and cervical paraspinals, for reduced pain and tone Joint Mobilizations: C4-6, grade I-III CPA's   Modalities: no redness or adverse reaction to today's modalities  Date:  Unattended Estim: bilateral upper trapezius, IFC @ 80-150 Hz w/ 40% scan, 10 mins, Pain and Tone Hot Pack: Cervical, 10 mins, Pain and Tone  PATIENT EDUCATION:  Education details: HEP, anatomy, and healing Person educated: patient Education method: verbal, and handout of HEP Education comprehension: patient reported understanding  HOME EXERCISE PROGRAM: [id number]  ASSESSMENT:  CLINICAL IMPRESSION: Patient well with treatment and felt better after.  Good response to treatment with decreased tone in UT's noted.    OBJECTIVE IMPAIRMENTS: decreased activity tolerance, decreased ROM, increased muscle spasms, postural dysfunction, and pain.   ACTIVITY LIMITATIONS: carrying and lifting  PARTICIPATION LIMITATIONS: meal prep, cleaning, laundry, and occupation  PERSONAL FACTORS: 1 comorbidity: 2 prior neck surgeries  are also affecting patient's functional outcome.   REHAB POTENTIAL: Good  CLINICAL DECISION MAKING: Evolving/moderate complexity  EVALUATION COMPLEXITY: Low   GOALS: LONG TERM GOALS: Target date: 09/01/23  Ind with a HEP.  Goal status: INITIAL  2.  Improve bilateral cervical rotation to 50-55 degrees. Goal status: INITIAL  3.  Perform ADL's with neck pain not > 3/10.  Goal status: INITIAL  PLAN:  PT FREQUENCY: 2x/week  PT DURATION: 6 weeks  PLANNED INTERVENTIONS: 97110-Therapeutic exercises, 97530- Therapeutic activity, [id number]- Neuromuscular re-education, 97535- Self Care, 09604- Manual therapy, 97014- Electrical stimulation (unattended), 97035- Ultrasound, Patient/Family education, Dry Needling, Cryotherapy, and Moist heat  PLAN FOR NEXT SESSION: Combo e'stim/US at 1.50 W/CM2, STW/M, postural exercises.     Ein Rijo, [place], PT 07/30/2023,  12:05 PM

## 2023-08-04 ENCOUNTER — Ambulatory Visit: Payer: 59 | Admitting: Physical Therapy

## 2023-08-04 DIAGNOSIS — M62838 Other muscle spasm: Secondary | ICD-10-CM

## 2023-08-04 DIAGNOSIS — M542 Cervicalgia: Secondary | ICD-10-CM | POA: Diagnosis not present

## 2023-08-04 NOTE — Therapy (Signed)
**Note De-Identified [place] Obfuscation** OUTPATIENT PHYSICAL THERAPY CERVICAL TREATMENT   Patient Name: Felicia Schultz MRN: [id number] DOB:11-20-1950, 73 y.o., female Today's Date: 08/04/2023  END OF SESSION:  PT End of Session - 08/04/23 1202     Visit Number 5    Number of Visits 12    Date for PT Re-Evaluation 09/01/23    PT Start Time 1100    PT Stop Time 1156    PT Time Calculation (min) 56 min    Activity Tolerance Patient tolerated treatment well    Behavior During Therapy [facility] for tasks assessed/performed               Past Medical History:  Diagnosis Date   Allergy    Anemia    Atrial flutter (HCC)    typical appearing   Basal cell carcinoma     nose - 1985   GERD (gastroesophageal reflux disease)    GI bleed    H/O cardiac radiofrequency ablation 2015   H/O gastric bypass 2005   History of diabetes mellitus 11/13/2015   Hypertension    Hypokalemia    Osteoporosis 2000   Paroxysmal atrial fibrillation (HCC)    Personal history of colonic polyps - sessile serrated 11/23/2013   Past Surgical History:  Procedure Laterality Date   APPENDECTOMY     ATRIAL FLUTTER ABLATION N/A 09/05/2014   Procedure: ATRIAL FLUTTER ABLATION;  Surgeon: Hillis Range, MD;  Location: [facility];  Service: Cardiovascular;  Laterality: N/A;   BIOPSY  06/23/2023   Procedure: BIOPSY;  Surgeon: Lemar Lofty., MD;  Location: [facility];  Service: Gastroenterology;;   BREAST EXCISIONAL BIOPSY Right 01/2016   CARPAL TUNNEL RELEASE Bilateral    CHOLECYSTECTOMY     ENTEROSCOPY N/A 06/23/2023   Procedure: ENTEROSCOPY;  Surgeon: Lemar Lofty., MD;  Location: [facility];  Service: Gastroenterology;  Laterality: N/A;   FOOT SURGERY Bilateral 2012   shorten bones in both feet   GASTRIC BYPASS     removal of ovary Right 1987   SPINE SURGERY  2000/2012   anterior cervical fusion with plating   SUBMUCOSAL TATTOO INJECTION  06/23/2023   Procedure: SUBMUCOSAL TATTOO INJECTION;  Surgeon: Lemar Lofty.,  MD;  Location: [facility];  Service: Gastroenterology;;   TOTAL ABDOMINAL HYSTERECTOMY  1999   WRIST SURGERY Right 2018   Dr. Orlan Leavens -ganglion excision   Patient Active Problem List   Diagnosis Date Noted   Thyroid nodule 06/30/2023   Upper GI bleed 06/23/2023   Gastrojejunal ulcer with hemorrhage 06/23/2023   Acute blood loss anemia 06/23/2023   Acute GI bleeding 06/23/2023   Vitamin B12 deficiency 08/18/2022   Tremor 08/14/2021   History of atrial fibrillation 08/03/2020   Bilateral dry eyes 03/28/2020   Vitreous membranes and strands, right 03/28/2020   Nuclear sclerotic cataract of right eye 03/28/2020   Nuclear sclerotic cataract of left eye 03/28/2020   Posterior vitreous detachment of left eye 03/28/2020   Posterior vitreous detachment of right eye 03/28/2020   Hyperglycemia 02/18/2019   S/P gastric bypass 07/09/2018   Achilles tendinosis 04/30/2018   Spondylosis of lumbar joint 05/28/2017   Plantar fasciitis, bilateral 05/28/2017   Chronic pain of right wrist 05/28/2017   Haglund's deformity of right heel 02/02/2017   Postmenopausal symptoms 06/02/2016   Cervical spondylosis 07/19/2014   GERD (gastroesophageal reflux disease) 07/19/2014   Vitamin D deficiency 07/19/2014   History of colonic polyps 11/23/2013   Allergic rhinitis 11/11/2011   REFERRING PROVIDER: Tressie Stalker MD  REFERRING **Note De-Identified [place] Obfuscation** DIAG: Cervicalgia  THERAPY DIAG:  Cervicalgia  Other muscle spasm  Rationale for Evaluation and Treatment: Rehabilitation  ONSET DATE: Mid-December 2024.  SUBJECTIVE:                                                                                                                                                                                                         SUBJECTIVE STATEMENT: The pain was so bad on the left the night before last I cried.  Pain mostly on left today, rated at a 5.  PERTINENT HISTORY:  2 prior cervical fusion surgeries.    PAIN:  Are you  having pain? Yes: NPRS scale: 5/10. Pain location: Bilateral neck. Pain description: Ache, sore, throbbing. Aggravating factors: As above. Relieving factors: As above.  PRECAUTIONS: None  RED FLAGS: None     WEIGHT BEARING RESTRICTIONS: No  FALLS:  Has patient fallen in last 6 months? 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   OBJECTIVE:  Note: Objective measures were completed at Evaluation unless otherwise noted.  DIAGNOSTIC FINDINGS:  IMPRESSION: 1. No acute intracranial abnormality. 2. Unchanged postoperative appearance from prior C4-5 and C6-7 ACDF. 3. Adjacent segment degeneration at C5-6 results in moderate to severe spinal canal stenosis and severe bilateral neural foraminal narrowing. 4. Severe left and moderate right neural foraminal narrowing at C3-4 and C7-T1.  PATIENT SURVEYS:  FOTO 45.71.  POSTURE: rounded shoulders and forward head  PALPATION: Diffuse bilateral tenderness and increased tone over cervical paraspinal and UT musculature.     CERVICAL ROM:   Active right cervical rotation is 42 degrees and left is 30#.  UPPER EXTREMITY ROM:  WNL.  UPPER EXTREMITY MMT: Normal UE strength.    DTR'S:  Normal UE DTR's.  TREATMENT DATE:   08/04/23:  UBE x 6 minutes f/b combo e'stim/US at 1.50 W/CM2 x 7 minutes to patient's left UT f/b STW/M with ischemic release technique x 10 minutes f/b HMP and IFC at 80-150 Hz on 40% scan x 20 minutes.   Normal modality response following removal of modality.  07/30/23:    Combo e'stim/US at 1.50 W/CM2 x 12 minutes to bil UT's f/b STW/M x 12 minutes with ischemic release technique utilized f/b  HMP and IFC at 80-150 Hz on 40% scan x 20 minutes. **Note De-Identified [place] Obfuscation** 07/28/23 EXERCISE  LOG  Exercise Repetitions and Resistance Comments  UBE  8 minutes @ 120 RPM   Supine arm circles 1/2# x 1 minute each; 2# x 2 x 30 seconds   Scapular retraction 30 reps    Upper trapezius stretch  3 x 30 seconds each    Seated chin tuck 20 reps   Manual therapy     Blank cell = exercise not performed today  Manual Therapy Soft Tissue Mobilization: bilateral upper trapezius, levator scapulae, and cervical paraspinals, for reduced pain and tone Joint Mobilizations: C4-6, grade I-III CPA's   Modalities: no redness or adverse reaction to today's modalities  Date:  Unattended Estim: bilateral upper trapezius, IFC @ 80-150 Hz w/ 40% scan, 10 mins, Pain and Tone Hot Pack: Cervical, 10 mins, Pain and Tone  PATIENT EDUCATION:  Education details: HEP, anatomy, and healing Person educated: patient Education method: verbal, and handout of HEP Education comprehension: patient reported understanding  HOME EXERCISE PROGRAM: [id number]  ASSESSMENT:  CLINICAL IMPRESSION: The patient reported a very high pain-level a two night ago.  Most pain on left today.  She did well with STW/M today with a decrease in left UT tone and pain following.    OBJECTIVE IMPAIRMENTS: decreased activity tolerance, decreased ROM, increased muscle spasms, postural dysfunction, and pain.   ACTIVITY LIMITATIONS: carrying and lifting  PARTICIPATION LIMITATIONS: meal prep, cleaning, laundry, and occupation  PERSONAL FACTORS: 1 comorbidity: 2 prior neck surgeries  are also affecting patient's functional outcome.   REHAB POTENTIAL: Good  CLINICAL DECISION MAKING: Evolving/moderate complexity  EVALUATION COMPLEXITY: Low   GOALS: LONG TERM GOALS: Target date: 09/01/23  Ind with a HEP.  Goal status: INITIAL  2.  Improve bilateral cervical rotation to 50-55 degrees. Goal status: INITIAL  3.  Perform ADL's with neck pain not > 3/10.  Goal status: INITIAL  PLAN:  PT FREQUENCY: 2x/week  PT DURATION: 6  weeks  PLANNED INTERVENTIONS: 97110-Therapeutic exercises, 97530- Therapeutic activity, [id number]- Neuromuscular re-education, 97535- Self Care, [id number]- Manual therapy, 97014- Electrical stimulation (unattended), 97035- Ultrasound, Patient/Family education, Dry Needling, Cryotherapy, and Moist heat  PLAN FOR NEXT SESSION: Combo e'stim/US at 1.50 W/CM2, STW/M, postural exercises.     Hero Kulish, [place], PT 08/04/2023, 12:05 PM

## 2023-08-06 ENCOUNTER — Ambulatory Visit: Payer: 59 | Admitting: Physical Therapy

## 2023-08-06 DIAGNOSIS — M6281 Muscle weakness (generalized): Secondary | ICD-10-CM

## 2023-08-06 DIAGNOSIS — M542 Cervicalgia: Secondary | ICD-10-CM

## 2023-08-06 DIAGNOSIS — M62838 Other muscle spasm: Secondary | ICD-10-CM

## 2023-08-06 NOTE — Therapy (Signed)
**Note De-Identified [place] Obfuscation** OUTPATIENT PHYSICAL THERAPY CERVICAL TREATMENT   Patient Name: Felicia Schultz MRN: [id number] DOB:[birth year], 73 y.o., female Today's Date: 08/06/2023  END OF SESSION:  PT End of Session - 08/06/23 1330     Visit Number 6    Number of Visits 12    Date for PT Re-Evaluation 09/01/23    PT Start Time 1258    PT Stop Time 1350    PT Time Calculation (min) 52 min    Activity Tolerance Patient tolerated treatment well    Behavior During Therapy [facility] for tasks assessed/performed               Past Medical History:  Diagnosis Date   Allergy    Anemia    Atrial flutter (HCC)    typical appearing   Basal cell carcinoma     nose - 1985   GERD (gastroesophageal reflux disease)    GI bleed    H/O cardiac radiofrequency ablation 2015   H/O gastric bypass 2005   History of diabetes mellitus 11/13/2015   Hypertension    Hypokalemia    Osteoporosis 2000   Paroxysmal atrial fibrillation (HCC)    Personal history of colonic polyps - sessile serrated 11/23/2013   Past Surgical History:  Procedure Laterality Date   APPENDECTOMY     ATRIAL FLUTTER ABLATION N/A 09/05/2014   Procedure: ATRIAL FLUTTER ABLATION;  Surgeon: Hillis Range, MD;  Location: [facility];  Service: Cardiovascular;  Laterality: N/A;   BIOPSY  06/23/2023   Procedure: BIOPSY;  Surgeon: Lemar Lofty., MD;  Location: [facility];  Service: Gastroenterology;;   BREAST EXCISIONAL BIOPSY Right 01/2016   CARPAL TUNNEL RELEASE Bilateral    CHOLECYSTECTOMY     ENTEROSCOPY N/A 06/23/2023   Procedure: ENTEROSCOPY;  Surgeon: Lemar Lofty., MD;  Location: [facility];  Service: Gastroenterology;  Laterality: N/A;   FOOT SURGERY Bilateral 2012   shorten bones in both feet   GASTRIC BYPASS     removal of ovary Right 1987   SPINE SURGERY  2000/2012   anterior cervical fusion with plating   SUBMUCOSAL TATTOO INJECTION  06/23/2023   Procedure: SUBMUCOSAL TATTOO INJECTION;  Surgeon: Lemar Lofty.,  MD;  Location: [facility];  Service: Gastroenterology;;   TOTAL ABDOMINAL HYSTERECTOMY  1999   WRIST SURGERY Right 2018   Dr. Orlan Leavens -ganglion excision   Patient Active Problem List   Diagnosis Date Noted   Thyroid nodule 06/30/2023   Upper GI bleed 06/23/2023   Gastrojejunal ulcer with hemorrhage 06/23/2023   Acute blood loss anemia 06/23/2023   Acute GI bleeding 06/23/2023   Vitamin B12 deficiency 08/18/2022   Tremor 08/14/2021   History of atrial fibrillation 08/03/2020   Bilateral dry eyes 03/28/2020   Vitreous membranes and strands, right 03/28/2020   Nuclear sclerotic cataract of right eye 03/28/2020   Nuclear sclerotic cataract of left eye 03/28/2020   Posterior vitreous detachment of left eye 03/28/2020   Posterior vitreous detachment of right eye 03/28/2020   Hyperglycemia 02/18/2019   S/P gastric bypass 07/09/2018   Achilles tendinosis 04/30/2018   Spondylosis of lumbar joint 05/28/2017   Plantar fasciitis, bilateral 05/28/2017   Chronic pain of right wrist 05/28/2017   Haglund's deformity of right heel 02/02/2017   Postmenopausal symptoms 06/02/2016   Cervical spondylosis 07/19/2014   GERD (gastroesophageal reflux disease) 07/19/2014   Vitamin D deficiency 07/19/2014   History of colonic polyps 11/23/2013   Allergic rhinitis 11/11/2011   REFERRING PROVIDER: Tressie Stalker MD  REFERRING **Note De-Identified [place] Obfuscation** DIAG: Cervicalgia  THERAPY DIAG:  Cervicalgia  Other muscle spasm  Muscle weakness (generalized)  Rationale for Evaluation and Treatment: Rehabilitation  ONSET DATE: Mid-December 2024.  SUBJECTIVE:                                                                                                                                                                                                         SUBJECTIVE STATEMENT: Little better today.  PERTINENT HISTORY:  2 prior cervical fusion surgeries.    PAIN:  Are you having pain? Yes: NPRS scale: 4/10. Pain location:  Bilateral neck. Pain description: Ache, sore, throbbing. Aggravating factors: As above. Relieving factors: As above.  PRECAUTIONS: None  RED FLAGS: None     WEIGHT BEARING RESTRICTIONS: No  FALLS:  Has patient fallen in last 6 months? 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   OBJECTIVE:  Note: Objective measures were completed at Evaluation unless otherwise noted.  DIAGNOSTIC FINDINGS:  IMPRESSION: 1. No acute intracranial abnormality. 2. Unchanged postoperative appearance from prior C4-5 and C6-7 ACDF. 3. Adjacent segment degeneration at C5-6 results in moderate to severe spinal canal stenosis and severe bilateral neural foraminal narrowing. 4. Severe left and moderate right neural foraminal narrowing at C3-4 and C7-T1.  PATIENT SURVEYS:  FOTO 45.71.  POSTURE: rounded shoulders and forward head  PALPATION: Diffuse bilateral tenderness and increased tone over cervical paraspinal and UT musculature.     CERVICAL ROM:   Active right cervical rotation is 42 degrees and left is 30#.  UPPER EXTREMITY ROM:  WNL.  UPPER EXTREMITY MMT: Normal UE strength.    DTR'S:  Normal UE DTR's.  TREATMENT DATE:   08/06/23:  UBE x 6 minutes f/b chin tucks and 2 minute continuous cervical extension  f/b combo e'stim/US at 1.50 W/CM2 x 7 minutes to patient's left UT f/b STW/M with ischemic release technique x 8 minutes f/b HMP and IFC at 80-150 Hz on 40% scan x 20 minutes.   Normal modality response following removal of modality.   08/04/23:  UBE x 6 minutes f/b combo e'stim/US at 1.50 W/CM2 x 7 minutes to patient's left UT f/b STW/M with ischemic release technique x 10 minutes f/b HMP and IFC at 80-150 Hz on 40% scan x 20 minutes.   Normal modality response following  removal of modality.  07/30/23:    Combo e'stim/US at 1.50 W/CM2 **Note De-Identified [place] Obfuscation** x 12 minutes to bil UT's f/b STW/M x 12 minutes with ischemic release technique utilized f/b  HMP and IFC at 80-150 Hz on 40% scan x 20 minutes.                                     07/28/23 EXERCISE LOG  Exercise Repetitions and Resistance Comments  UBE  8 minutes @ 120 RPM   Supine arm circles 1/2# x 1 minute each; 2# x 2 x 30 seconds   Scapular retraction 30 reps    Upper trapezius stretch  3 x 30 seconds each    Seated chin tuck 20 reps   Manual therapy     Blank cell = exercise not performed today  Manual Therapy Soft Tissue Mobilization: bilateral upper trapezius, levator scapulae, and cervical paraspinals, for reduced pain and tone Joint Mobilizations: C4-6, grade I-III CPA's   Modalities: no redness or adverse reaction to today's modalities  Date:  Unattended Estim: bilateral upper trapezius, IFC @ 80-150 Hz w/ 40% scan, 10 mins, Pain and Tone Hot Pack: Cervical, 10 mins, Pain and Tone  PATIENT EDUCATION:  Education details: Chin tucks and cervical extension Person educated: patient Education method: verbal, and handout of HEP Education comprehension: patient reported understanding  HOME EXERCISE PROGRAM:  HOME PROGRAM HOME EXERCISE PROGRAM Created by [place] Thamar Holik Feb 20th, 2025 View at [email or web address] using code: [email or web address]  Page 1 of 1 2 Exercises RETRACTION / CHIN TUCK Slowly draw your head back so that your ears line up with your shoulders. Repeat 10 Times Hold 5 Seconds Complete 1 Set Perform 6 Times a Day CERVICAL EXTENSION WITH TOWEL - CURVE OF NECK Start with a small hand towel wrapped around the curve of your neck and holding the ends of the towel forward as shown. Next, extend your neck back over the towel as to look up at the ceiling. Then, return to starting position.  Your hands should remain still and holding the ends of the towel the entire time. Repeat 5 Times Hold 2  Seconds Complete 1 Set Perform 3 Times a Day  ASSESSMENT:  CLINICAL IMPRESSION: Patient doing a little.  She exhibited less left UT tone today.    OBJECTIVE IMPAIRMENTS: decreased activity tolerance, decreased ROM, increased muscle spasms, postural dysfunction, and pain.   ACTIVITY LIMITATIONS: carrying and lifting  PARTICIPATION LIMITATIONS: meal prep, cleaning, laundry, and occupation  PERSONAL FACTORS: 1 comorbidity: 2 prior neck surgeries  are also affecting patient's functional outcome.   REHAB POTENTIAL: Good  CLINICAL DECISION MAKING: Evolving/moderate complexity  EVALUATION COMPLEXITY: Low   GOALS: LONG TERM GOALS: Target date: 09/01/23  Ind with a HEP.  Goal status: INITIAL  2.  Improve bilateral cervical rotation to 50-55 degrees. Goal status: INITIAL  3.  Perform ADL's with neck pain not > 3/10.  Goal status: INITIAL  PLAN:  PT FREQUENCY: 2x/week  PT DURATION: 6 weeks  PLANNED INTERVENTIONS: 97110-Therapeutic exercises, 97530- Therapeutic activity, [id number]- Neuromuscular re-education, 97535- Self Care, 16109- Manual therapy, 97014- Electrical stimulation (unattended), 97035- Ultrasound, Patient/Family education, Dry Needling, Cryotherapy, and Moist heat  PLAN FOR NEXT SESSION: Combo e'stim/US at 1.50 W/CM2, STW/M, postural exercises.     Rolando Whitby, [place], PT 08/06/2023, 1:55 PM

## 2023-08-11 ENCOUNTER — Ambulatory Visit: Payer: 59 | Admitting: Physical Therapy

## 2023-08-11 DIAGNOSIS — M542 Cervicalgia: Secondary | ICD-10-CM | POA: Diagnosis not present

## 2023-08-11 DIAGNOSIS — M62838 Other muscle spasm: Secondary | ICD-10-CM

## 2023-08-11 NOTE — Therapy (Signed)
**Note De-Identified [place] [facility]** OUTPATIENT PHYSICAL THERAPY CERVICAL TREATMENT   Patient Name: Felicia Schultz MRN: [id number] DOB:December 21, 1950, 73 y.o., female Today's Date: 08/11/2023  END OF SESSION:  PT End of Session - 08/11/23 1341     Visit Number 7    Number of Visits 12    Date for PT Re-Evaluation 09/01/23    PT Start Time 0100    PT Stop Time 0153    PT Time Calculation (min) 53 min    Activity Tolerance Patient tolerated treatment well    Behavior During Therapy [facility] for tasks assessed/performed               Past Medical History:  Diagnosis Date   Allergy    Anemia    Atrial flutter (HCC)    typical appearing   Basal cell carcinoma     nose - 1985   GERD (gastroesophageal reflux disease)    GI bleed    H/O cardiac radiofrequency ablation 2015   H/O gastric bypass 2005   History of diabetes mellitus 11/13/2015   Hypertension    Hypokalemia    Osteoporosis 2000   Paroxysmal atrial fibrillation (HCC)    Personal history of colonic polyps - sessile serrated 11/23/2013   Past Surgical History:  Procedure Laterality Date   APPENDECTOMY     ATRIAL FLUTTER ABLATION N/A 09/05/2014   Procedure: ATRIAL FLUTTER ABLATION;  Surgeon: Hillis Range, MD;  Location: [facility];  Service: Cardiovascular;  Laterality: N/A;   BIOPSY  06/23/2023   Procedure: BIOPSY;  Surgeon: Lemar Lofty., MD;  Location: [facility];  Service: Gastroenterology;;   BREAST EXCISIONAL BIOPSY Right 01/2016   CARPAL TUNNEL RELEASE Bilateral    CHOLECYSTECTOMY     ENTEROSCOPY N/A 06/23/2023   Procedure: ENTEROSCOPY;  Surgeon: Lemar Lofty., MD;  Location: [facility];  Service: Gastroenterology;  Laterality: N/A;   FOOT SURGERY Bilateral 2012   shorten bones in both feet   GASTRIC BYPASS     removal of ovary Right 1987   SPINE SURGERY  2000/2012   anterior cervical fusion with plating   SUBMUCOSAL TATTOO INJECTION  06/23/2023   Procedure: SUBMUCOSAL TATTOO INJECTION;  Surgeon: Lemar Lofty.,  MD;  Location: [facility];  Service: Gastroenterology;;   TOTAL ABDOMINAL HYSTERECTOMY  1999   WRIST SURGERY Right 2018   Dr. Orlan Leavens -ganglion excision   Patient Active Problem List   Diagnosis Date Noted   Thyroid nodule 06/30/2023   Upper GI bleed 06/23/2023   Gastrojejunal ulcer with hemorrhage 06/23/2023   Acute blood loss anemia 06/23/2023   Acute GI bleeding 06/23/2023   Vitamin B12 deficiency 08/18/2022   Tremor 08/14/2021   History of atrial fibrillation 08/03/2020   Bilateral dry eyes 03/28/2020   Vitreous membranes and strands, right 03/28/2020   Nuclear sclerotic cataract of right eye 03/28/2020   Nuclear sclerotic cataract of left eye 03/28/2020   Posterior vitreous detachment of left eye 03/28/2020   Posterior vitreous detachment of right eye 03/28/2020   Hyperglycemia 02/18/2019   S/P gastric bypass 07/09/2018   Achilles tendinosis 04/30/2018   Spondylosis of lumbar joint 05/28/2017   Plantar fasciitis, bilateral 05/28/2017   Chronic pain of right wrist 05/28/2017   Haglund's deformity of right heel 02/02/2017   Postmenopausal symptoms 06/02/2016   Cervical spondylosis 07/19/2014   GERD (gastroesophageal reflux disease) 07/19/2014   Vitamin D deficiency 07/19/2014   History of colonic polyps 11/23/2013   Allergic rhinitis 11/11/2011   REFERRING PROVIDER: Tressie Stalker MD  REFERRING **Note De-Identified [place] [facility]** DIAG: Cervicalgia  THERAPY DIAG:  Cervicalgia  Other muscle spasm  Rationale for Evaluation and Treatment: Rehabilitation  ONSET DATE: Mid-December 2024.  SUBJECTIVE:                                                                                                                                                                                                         SUBJECTIVE STATEMENT: Pain about a 5.  Most of left. PERTINENT HISTORY:  2 prior cervical fusion surgeries.    PAIN:  Are you having pain? Yes: NPRS scale: 5/10. Pain location: Bilateral neck. Pain  description: Ache, sore, throbbing. Aggravating factors: As above. Relieving factors: As above.  PRECAUTIONS: None  RED FLAGS: None     WEIGHT BEARING RESTRICTIONS: No  FALLS:  Has patient fallen in last 6 months? 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   OBJECTIVE:  Note: Objective measures were completed at Evaluation unless otherwise noted.  DIAGNOSTIC FINDINGS:  IMPRESSION: 1. No acute intracranial abnormality. 2. Unchanged postoperative appearance from prior C4-5 and C6-7 ACDF. 3. Adjacent segment degeneration at C5-6 results in moderate to severe spinal canal stenosis and severe bilateral neural foraminal narrowing. 4. Severe left and moderate right neural foraminal narrowing at C3-4 and C7-T1.  PATIENT SURVEYS:  FOTO 45.71.  POSTURE: rounded shoulders and forward head  PALPATION: Diffuse bilateral tenderness and increased tone over cervical paraspinal and UT musculature.     CERVICAL ROM:   Active right cervical rotation is 42 degrees and left is 30#.  UPPER EXTREMITY ROM:  WNL.  UPPER EXTREMITY MMT: Normal UE strength.    DTR'S:  Normal UE DTR's.  TREATMENT DATE:   08/11/23:  Combo e'stim/US at 1.50 W/CM2 x 12 minutes f/b STW/M x 11 minutes with ischemic release technique to left UT f/b HMP and IFC at 80-150 Hz on 40% scan x 20 minutes.  Normal modality response following removal of modality.   08/06/23:  UBE x 6 minutes f/b chin tucks and 2 minute continuous cervical extension  f/b combo e'stim/US at 1.50 W/CM2 x 7 minutes to patient's left UT f/b STW/M with ischemic release technique x 8 minutes f/b HMP and IFC at 80-150 Hz on 40% scan x 20 minutes.   Normal modality response following removal of modality.   08/04/23:  UBE x 6 minutes  f/b combo e'stim/US at 1.50 W/CM2 x 7 minutes **Note De-Identified [place] [facility]ation** to patient's left UT f/b STW/M with ischemic release technique x 10 minutes f/b HMP and IFC at 80-150 Hz on 40% scan x 20 minutes.   Normal modality response following removal of modality.                         PATIENT EDUCATION:  Education details: Chin tucks and cervical extension Person educated: patient Education method: verbal, and handout of HEP Education comprehension: patient reported understanding  HOME EXERCISE PROGRAM:  HOME PROGRAM HOME EXERCISE PROGRAM Created by [place] Amberlee Garvey Feb 20th, 2025 View at [email or web address] using code: [email or web address]  Page 1 of 1 2 Exercises RETRACTION / CHIN TUCK Slowly draw your head back so that your ears line up with your shoulders. Repeat 10 Times Hold 5 Seconds Complete 1 Set Perform 6 Times a Day CERVICAL EXTENSION WITH TOWEL - CURVE OF NECK Start with a small hand towel wrapped around the curve of your neck and holding the ends of the towel forward as shown. Next, extend your neck back over the towel as to look up at the ceiling. Then, return to starting position.  Your hands should remain still and holding the ends of the towel the entire time. Repeat 5 Times Hold 2 Seconds Complete 1 Set Perform 3 Times a Day  ASSESSMENT:  CLINICAL IMPRESSION: Patient continues to have neck pain, left > right.  She has really only been experiencing temporary pain relief from treatments thus far.     OBJECTIVE IMPAIRMENTS: decreased activity tolerance, decreased ROM, increased muscle spasms, postural dysfunction, and pain.   ACTIVITY LIMITATIONS: carrying and lifting  PARTICIPATION LIMITATIONS: meal prep, cleaning, laundry, and occupation  PERSONAL FACTORS: 1 comorbidity: 2 prior neck surgeries  are also affecting patient's functional outcome.   REHAB POTENTIAL: Good  CLINICAL DECISION MAKING: Evolving/moderate complexity  EVALUATION COMPLEXITY: Low   GOALS: LONG TERM GOALS: Target  date: 09/01/23  Ind with a HEP.  Goal status: INITIAL  2.  Improve bilateral cervical rotation to 50-55 degrees. Goal status: INITIAL  3.  Perform ADL's with neck pain not > 3/10.  Goal status: INITIAL  PLAN:  PT FREQUENCY: 2x/week  PT DURATION: 6 weeks  PLANNED INTERVENTIONS: 97110-Therapeutic exercises, 97530- Therapeutic activity, [id number]- Neuromuscular re-education, 97535- Self Care, [id number]- Manual therapy, 97014- Electrical stimulation (unattended), 97035- Ultrasound, Patient/Family education, Dry Needling, Cryotherapy, and Moist heat  PLAN FOR NEXT SESSION: Combo e'stim/US at 1.50 W/CM2, STW/M, postural exercises.     Jabree Rebert, [place], PT 08/11/2023, 1:59 PM

## 2023-08-13 ENCOUNTER — Ambulatory Visit: Payer: 59 | Admitting: Physical Therapy

## 2023-08-13 ENCOUNTER — Encounter: Payer: Self-pay | Admitting: Physical Therapy

## 2023-08-13 DIAGNOSIS — M542 Cervicalgia: Secondary | ICD-10-CM

## 2023-08-13 DIAGNOSIS — M62838 Other muscle spasm: Secondary | ICD-10-CM

## 2023-08-13 NOTE — Therapy (Signed)
**Note De-Identified [place] [facility]** OUTPATIENT PHYSICAL THERAPY CERVICAL TREATMENT   Patient Name: Felicia Schultz MRN: [id number] DOB:[birth year], 73 y.o., female Today's Date: 08/13/2023  END OF SESSION:  PT End of Session - 08/13/23 1430     Visit Number 8    Number of Visits 12    Date for PT Re-Evaluation 09/01/23    PT Start Time 0145    PT Stop Time 0236    PT Time Calculation (min) 51 min    Activity Tolerance Patient tolerated treatment well    Behavior During Therapy [facility] for tasks assessed/performed                Past Medical History:  Diagnosis Date   Allergy    Anemia    Atrial flutter (HCC)    typical appearing   Basal cell carcinoma     nose - 1985   GERD (gastroesophageal reflux disease)    GI bleed    H/O cardiac radiofrequency ablation 2015   H/O gastric bypass 2005   History of diabetes mellitus 11/13/2015   Hypertension    Hypokalemia    Osteoporosis 2000   Paroxysmal atrial fibrillation (HCC)    Personal history of colonic polyps - sessile serrated 11/23/2013   Past Surgical History:  Procedure Laterality Date   APPENDECTOMY     ATRIAL FLUTTER ABLATION N/A 09/05/2014   Procedure: ATRIAL FLUTTER ABLATION;  Surgeon: Hillis Range, MD;  Location: [facility];  Service: Cardiovascular;  Laterality: N/A;   BIOPSY  06/23/2023   Procedure: BIOPSY;  Surgeon: Lemar Lofty., MD;  Location: [facility];  Service: Gastroenterology;;   BREAST EXCISIONAL BIOPSY Right 01/2016   CARPAL TUNNEL RELEASE Bilateral    CHOLECYSTECTOMY     ENTEROSCOPY N/A 06/23/2023   Procedure: ENTEROSCOPY;  Surgeon: Lemar Lofty., MD;  Location: [facility];  Service: Gastroenterology;  Laterality: N/A;   FOOT SURGERY Bilateral 2012   shorten bones in both feet   GASTRIC BYPASS     removal of ovary Right 1987   SPINE SURGERY  2000/2012   anterior cervical fusion with plating   SUBMUCOSAL TATTOO INJECTION  06/23/2023   Procedure: SUBMUCOSAL TATTOO INJECTION;  Surgeon: Lemar Lofty.,  MD;  Location: [facility];  Service: Gastroenterology;;   TOTAL ABDOMINAL HYSTERECTOMY  1999   WRIST SURGERY Right 2018   Dr. Orlan Leavens -ganglion excision   Patient Active Problem List   Diagnosis Date Noted   Thyroid nodule 06/30/2023   Upper GI bleed 06/23/2023   Gastrojejunal ulcer with hemorrhage 06/23/2023   Acute blood loss anemia 06/23/2023   Acute GI bleeding 06/23/2023   Vitamin B12 deficiency 08/18/2022   Tremor 08/14/2021   History of atrial fibrillation 08/03/2020   Bilateral dry eyes 03/28/2020   Vitreous membranes and strands, right 03/28/2020   Nuclear sclerotic cataract of right eye 03/28/2020   Nuclear sclerotic cataract of left eye 03/28/2020   Posterior vitreous detachment of left eye 03/28/2020   Posterior vitreous detachment of right eye 03/28/2020   Hyperglycemia 02/18/2019   S/P gastric bypass 07/09/2018   Achilles tendinosis 04/30/2018   Spondylosis of lumbar joint 05/28/2017   Plantar fasciitis, bilateral 05/28/2017   Chronic pain of right wrist 05/28/2017   Haglund's deformity of right heel 02/02/2017   Postmenopausal symptoms 06/02/2016   Cervical spondylosis 07/19/2014   GERD (gastroesophageal reflux disease) 07/19/2014   Vitamin D deficiency 07/19/2014   History of colonic polyps 11/23/2013   Allergic rhinitis 11/11/2011   REFERRING PROVIDER: Tressie Stalker MD **Note De-Identified [place] [facility]** REFERRING DIAG: Cervicalgia  THERAPY DIAG:  Cervicalgia  Other muscle spasm  Rationale for Evaluation and Treatment: Rehabilitation  ONSET DATE: Mid-December 2024.  SUBJECTIVE:                                                                                                                                                                                                         SUBJECTIVE STATEMENT: About the same.  PERTINENT HISTORY:  2 prior cervical fusion surgeries.    PAIN:  Are you having pain? Yes: NPRS scale: 5/10. Pain location: Bilateral neck. Pain description: Ache,  sore, throbbing. Aggravating factors: As above. Relieving factors: As above.  PRECAUTIONS: None  RED FLAGS: None     WEIGHT BEARING RESTRICTIONS: No  FALLS:  Has patient fallen in last 6 months? 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   OBJECTIVE:  Note: Objective measures were completed at Evaluation unless otherwise noted.  DIAGNOSTIC FINDINGS:  IMPRESSION: 1. No acute intracranial abnormality. 2. Unchanged postoperative appearance from prior C4-5 and C6-7 ACDF. 3. Adjacent segment degeneration at C5-6 results in moderate to severe spinal canal stenosis and severe bilateral neural foraminal narrowing. 4. Severe left and moderate right neural foraminal narrowing at C3-4 and C7-T1.  PATIENT SURVEYS:  FOTO 45.71.  POSTURE: rounded shoulders and forward head  PALPATION: Diffuse bilateral tenderness and increased tone over cervical paraspinal and UT musculature.     CERVICAL ROM:   Active right cervical rotation is 42 degrees and left is 30#.  UPPER EXTREMITY ROM:  WNL.  UPPER EXTREMITY MMT: Normal UE strength.    DTR'S:  Normal UE DTR's.  TREATMENT DATE:   08/13/23:  UBE x 8 minutes f/b Combo e'stim/US at 1.50 W/CM2 x 7 minutes f/b STW/M x 9 minutes with ischemic release technique to left UT f/b HMP and IFC at 80-150 Hz on 40% scan x 20 minutes.  Normal modality response following removal of modality.  08/11/23:  Combo e'stim/US at 1.50 W/CM2 x 12 minutes f/b STW/M x 11 minutes with ischemic release technique to left UT f/b HMP and IFC at 80-150 Hz on 40% scan x 20 minutes.  Normal modality response following removal of modality.                           PATIENT EDUCATION:  Education details: Chin tucks and **Note De-Identified [place] [facility]bfuscation** cervical extension Person  educated: patient Education method: verbal, and handout of HEP Education comprehension: patient reported understanding  HOME EXERCISE PROGRAM:  HOME PROGRAM HOME EXERCISE PROGRAM Created by [place] Channell Quattrone Feb 20th, 2025 View at [email or web address] using code: [email or web address]  Page 1 of 1 2 Exercises RETRACTION / CHIN TUCK Slowly draw your head back so that your ears line up with your shoulders. Repeat 10 Times Hold 5 Seconds Complete 1 Set Perform 6 Times a Day CERVICAL EXTENSION WITH TOWEL - CURVE OF NECK Start with a small hand towel wrapped around the curve of your neck and holding the ends of the towel forward as shown. Next, extend your neck back over the towel as to look up at the ceiling. Then, return to starting position.  Your hands should remain still and holding the ends of the towel the entire time. Repeat 5 Times Hold 2 Seconds Complete 1 Set Perform 3 Times a Day  ASSESSMENT:  CLINICAL IMPRESSION: Patient did well wit UBE with no pain increase.  CC continues to be pain over the left UT region.  OBJECTIVE IMPAIRMENTS: decreased activity tolerance, decreased ROM, increased muscle spasms, postural dysfunction, and pain.   ACTIVITY LIMITATIONS: carrying and lifting  PARTICIPATION LIMITATIONS: meal prep, cleaning, laundry, and occupation  PERSONAL FACTORS: 1 comorbidity: 2 prior neck surgeries  are also affecting patient's functional outcome.   REHAB POTENTIAL: Good  CLINICAL DECISION MAKING: Evolving/moderate complexity  EVALUATION COMPLEXITY: Low   GOALS: LONG TERM GOALS: Target date: 09/01/23  Ind with a HEP.  Goal status: INITIAL  2.  Improve bilateral cervical rotation to 50-55 degrees. Goal status: INITIAL  3.  Perform ADL's with neck pain not > 3/10.  Goal status: INITIAL  PLAN:  PT FREQUENCY: 2x/week  PT DURATION: 6 weeks  PLANNED INTERVENTIONS: 97110-Therapeutic exercises, 97530- Therapeutic activity, [id number]- Neuromuscular re-education,  97535- Self Care, 09811- Manual therapy, 97014- Electrical stimulation (unattended), 97035- Ultrasound, Patient/Family education, Dry Needling, Cryotherapy, and Moist heat  PLAN FOR NEXT SESSION: Combo e'stim/US at 1.50 W/CM2, STW/M, postural exercises.     Sonnie Pawloski, [place], PT 08/13/2023, 2:39 PM

## 2023-08-18 ENCOUNTER — Ambulatory Visit: Payer: 59 | Attending: Neurosurgery | Admitting: Physical Therapy

## 2023-08-18 ENCOUNTER — Encounter: Payer: Self-pay | Admitting: Physical Therapy

## 2023-08-18 DIAGNOSIS — M542 Cervicalgia: Secondary | ICD-10-CM | POA: Diagnosis present

## 2023-08-18 DIAGNOSIS — M62838 Other muscle spasm: Secondary | ICD-10-CM | POA: Diagnosis present

## 2023-08-18 NOTE — Therapy (Signed)
**Note De-Identified Graybill Obfuscation** OUTPATIENT PHYSICAL THERAPY CERVICAL TREATMENT   Patient Name: Felicia Schultz MRN: [id number] DOB:1950-09-05, 73 y.o., female Today's Date: 08/18/2023  END OF SESSION:  PT End of Session - 08/18/23 1331     Visit Number 9    Number of Visits 12    Date for PT Re-Evaluation 09/01/23    PT Start Time 1256    PT Stop Time 1351    PT Time Calculation (min) 55 min    Activity Tolerance Patient tolerated treatment well    Behavior During Therapy [facility] for tasks assessed/performed                Past Medical History:  Diagnosis Date   Allergy    Anemia    Atrial flutter (HCC)    typical appearing   Basal cell carcinoma     nose - 1985   GERD (gastroesophageal reflux disease)    GI bleed    H/O cardiac radiofrequency ablation 2015   H/O gastric bypass 2005   History of diabetes mellitus 11/13/2015   Hypertension    Hypokalemia    Osteoporosis 2000   Paroxysmal atrial fibrillation (HCC)    Personal history of colonic polyps - sessile serrated 11/23/2013   Past Surgical History:  Procedure Laterality Date   APPENDECTOMY     ATRIAL FLUTTER ABLATION N/A 09/05/2014   Procedure: ATRIAL FLUTTER ABLATION;  Surgeon: Hillis Range, MD;  Location: [facility];  Service: Cardiovascular;  Laterality: N/A;   BIOPSY  06/23/2023   Procedure: BIOPSY;  Surgeon: Lemar Lofty., MD;  Location: [facility];  Service: Gastroenterology;;   BREAST EXCISIONAL BIOPSY Right 01/2016   CARPAL TUNNEL RELEASE Bilateral    CHOLECYSTECTOMY     ENTEROSCOPY N/A 06/23/2023   Procedure: ENTEROSCOPY;  Surgeon: Lemar Lofty., MD;  Location: [facility];  Service: Gastroenterology;  Laterality: N/A;   FOOT SURGERY Bilateral 2012   shorten bones in both feet   GASTRIC BYPASS     removal of ovary Right 1987   SPINE SURGERY  2000/2012   anterior cervical fusion with plating   SUBMUCOSAL TATTOO INJECTION  06/23/2023   Procedure: SUBMUCOSAL TATTOO INJECTION;  Surgeon: Lemar Lofty.,  MD;  Location: [facility];  Service: Gastroenterology;;   TOTAL ABDOMINAL HYSTERECTOMY  1999   WRIST SURGERY Right 2018   Dr. Orlan Leavens -ganglion excision   Patient Active Problem List   Diagnosis Date Noted   Thyroid nodule 06/30/2023   Upper GI bleed 06/23/2023   Gastrojejunal ulcer with hemorrhage 06/23/2023   Acute blood loss anemia 06/23/2023   Acute GI bleeding 06/23/2023   Vitamin B12 deficiency 08/18/2022   Tremor 08/14/2021   History of atrial fibrillation 08/03/2020   Bilateral dry eyes 03/28/2020   Vitreous membranes and strands, right 03/28/2020   Nuclear sclerotic cataract of right eye 03/28/2020   Nuclear sclerotic cataract of left eye 03/28/2020   Posterior vitreous detachment of left eye 03/28/2020   Posterior vitreous detachment of right eye 03/28/2020   Hyperglycemia 02/18/2019   S/P gastric bypass 07/09/2018   Achilles tendinosis 04/30/2018   Spondylosis of lumbar joint 05/28/2017   Plantar fasciitis, bilateral 05/28/2017   Chronic pain of right wrist 05/28/2017   Haglund's deformity of right heel 02/02/2017   Postmenopausal symptoms 06/02/2016   Cervical spondylosis 07/19/2014   GERD (gastroesophageal reflux disease) 07/19/2014   Vitamin D deficiency 07/19/2014   History of colonic polyps 11/23/2013   Allergic rhinitis 11/11/2011   REFERRING PROVIDER: Tressie Stalker MD **Note De-Identified Milke Obfuscation** REFERRING DIAG: Cervicalgia  THERAPY DIAG:  Cervicalgia  Other muscle spasm  Rationale for Evaluation and Treatment: Rehabilitation  ONSET DATE: Mid-December 2024.  SUBJECTIVE:                                                                                                                                                                                                         SUBJECTIVE STATEMENT: No long last benefits from PT. PERTINENT HISTORY:  2 prior cervical fusion surgeries.    PAIN:  Are you having pain? Yes: NPRS scale: 5/10. Pain location: Bilateral neck. Pain  description: Ache, sore, throbbing. Aggravating factors: As above. Relieving factors: As above.  PRECAUTIONS: None  RED FLAGS: None     WEIGHT BEARING RESTRICTIONS: No  FALLS:  Has patient fallen in last 6 months? 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   OBJECTIVE:  Note: Objective measures were completed at Evaluation unless otherwise noted.  DIAGNOSTIC FINDINGS:  IMPRESSION: 1. No acute intracranial abnormality. 2. Unchanged postoperative appearance from prior C4-5 and C6-7 ACDF. 3. Adjacent segment degeneration at C5-6 results in moderate to severe spinal canal stenosis and severe bilateral neural foraminal narrowing. 4. Severe left and moderate right neural foraminal narrowing at C3-4 and C7-T1.  PATIENT SURVEYS:  FOTO 45.71.  POSTURE: rounded shoulders and forward head  PALPATION: Diffuse bilateral tenderness and increased tone over cervical paraspinal and UT musculature.     CERVICAL ROM:   Active right cervical rotation is 42 degrees and left is 30#.  UPPER EXTREMITY ROM:  WNL.  UPPER EXTREMITY MMT: Normal UE strength.    DTR'S:  Normal UE DTR's.  TREATMENT DATE:   08/18/23:  UBE x 8 minutes f/b Combo e'stim/US at 1.50 W/CM2 x 7 minutes f/b STW/M x 8 minutes with ischemic release technique to left UT f/b HMP and IFC at 80-150 Hz on 40% scan x 20 minutes.  Normal modality response following removal of modality.  08/13/23:  UBE x 8 minutes f/b Combo e'stim/US at 1.50 W/CM2 x 7 minutes f/b STW/M x 9 minutes with ischemic release technique to left UT f/b HMP and IFC at 80-150 Hz on 40% scan x 20 minutes.  Normal modality response following removal of modality.                     PATIENT EDUCATION:  Education details: Chin tucks **Note De-Identified No Obfuscation** and cervical  extension Person educated: patient Education method: verbal, and handout of HEP Education comprehension: patient reported understanding  HOME EXERCISE PROGRAM:  HOME PROGRAM HOME EXERCISE PROGRAM Created by [place] Vearl Allbaugh Feb 20th, 2025 View at [email or web address] using code: [email or web address]  Page 1 of 1 2 Exercises RETRACTION / CHIN TUCK Slowly draw your head back so that your ears line up with your shoulders. Repeat 10 Times Hold 5 Seconds Complete 1 Set Perform 6 Times a Day CERVICAL EXTENSION WITH TOWEL - CURVE OF NECK Start with a small hand towel wrapped around the curve of your neck and holding the ends of the towel forward as shown. Next, extend your neck back over the towel as to look up at the ceiling. Then, return to starting position.  Your hands should remain still and holding the ends of the towel the entire time. Repeat 5 Times Hold 2 Seconds Complete 1 Set Perform 3 Times a Day  ASSESSMENT:  CLINICAL IMPRESSION: Patient continues to have neck pain, left > right and no long lasting pain relief.  Plan discharge next visit and patient to return to PT.   OBJECTIVE IMPAIRMENTS: decreased activity tolerance, decreased ROM, increased muscle spasms, postural dysfunction, and pain.   ACTIVITY LIMITATIONS: carrying and lifting  PARTICIPATION LIMITATIONS: meal prep, cleaning, laundry, and occupation  PERSONAL FACTORS: 1 comorbidity: 2 prior neck surgeries  are also affecting patient's functional outcome.   REHAB POTENTIAL: Good  CLINICAL DECISION MAKING: Evolving/moderate complexity  EVALUATION COMPLEXITY: Low   GOALS: LONG TERM GOALS: Target date: 09/01/23  Ind with a HEP.  Goal status: INITIAL  2.  Improve bilateral cervical rotation to 50-55 degrees. Goal status: INITIAL  3.  Perform ADL's with neck pain not > 3/10.  Goal status: INITIAL  PLAN:  PT FREQUENCY: 2x/week  PT DURATION: 6 weeks  PLANNED INTERVENTIONS: 97110-Therapeutic exercises, 97530-  Therapeutic activity, [id number]- Neuromuscular re-education, 97535- Self Care, 81191- Manual therapy, 97014- Electrical stimulation (unattended), 97035- Ultrasound, Patient/Family education, Dry Needling, Cryotherapy, and Moist heat  PLAN FOR NEXT SESSION: Combo e'stim/US at 1.50 W/CM2, STW/M, postural exercises.     Mylei Brackeen, [place], PT 08/18/2023, 1:54 PM

## 2023-08-19 ENCOUNTER — Ambulatory Visit: Payer: 59 | Admitting: Physical Therapy

## 2023-08-19 DIAGNOSIS — M62838 Other muscle spasm: Secondary | ICD-10-CM

## 2023-08-19 DIAGNOSIS — M542 Cervicalgia: Secondary | ICD-10-CM

## 2023-08-19 NOTE — Therapy (Signed)
**Note De-Identified Liese Obfuscation** OUTPATIENT PHYSICAL THERAPY CERVICAL TREATMENT   Patient Name: Felicia Schultz MRN: [id number] DOB:[birth year], 73 y.o., female Today's Date: 08/19/2023  END OF SESSION:  PT End of Session - 08/19/23 1300     Visit Number 10    Number of Visits 12    Date for PT Re-Evaluation 09/01/23    PT Start Time 1255    PT Stop Time 1343    PT Time Calculation (min) 48 min    Activity Tolerance Patient tolerated treatment well    Behavior During Therapy [facility] for tasks assessed/performed                Past Medical History:  Diagnosis Date   Allergy    Anemia    Atrial flutter (HCC)    typical appearing   Basal cell carcinoma     nose - 1985   GERD (gastroesophageal reflux disease)    GI bleed    H/O cardiac radiofrequency ablation 2015   H/O gastric bypass 2005   History of diabetes mellitus 11/13/2015   Hypertension    Hypokalemia    Osteoporosis 2000   Paroxysmal atrial fibrillation (HCC)    Personal history of colonic polyps - sessile serrated 11/23/2013   Past Surgical History:  Procedure Laterality Date   APPENDECTOMY     ATRIAL FLUTTER ABLATION N/A 09/05/2014   Procedure: ATRIAL FLUTTER ABLATION;  Surgeon: Hillis Range, MD;  Location: [facility];  Service: Cardiovascular;  Laterality: N/A;   BIOPSY  06/23/2023   Procedure: BIOPSY;  Surgeon: Lemar Lofty., MD;  Location: [facility];  Service: Gastroenterology;;   BREAST EXCISIONAL BIOPSY Right 01/2016   CARPAL TUNNEL RELEASE Bilateral    CHOLECYSTECTOMY     ENTEROSCOPY N/A 06/23/2023   Procedure: ENTEROSCOPY;  Surgeon: Lemar Lofty., MD;  Location: [facility];  Service: Gastroenterology;  Laterality: N/A;   FOOT SURGERY Bilateral 2012   shorten bones in both feet   GASTRIC BYPASS     removal of ovary Right 1987   SPINE SURGERY  2000/2012   anterior cervical fusion with plating   SUBMUCOSAL TATTOO INJECTION  06/23/2023   Procedure: SUBMUCOSAL TATTOO INJECTION;  Surgeon: Lemar Lofty.,  MD;  Location: [facility];  Service: Gastroenterology;;   TOTAL ABDOMINAL HYSTERECTOMY  1999   WRIST SURGERY Right 2018   Dr. Orlan Leavens -ganglion excision   Patient Active Problem List   Diagnosis Date Noted   Thyroid nodule 06/30/2023   Upper GI bleed 06/23/2023   Gastrojejunal ulcer with hemorrhage 06/23/2023   Acute blood loss anemia 06/23/2023   Acute GI bleeding 06/23/2023   Vitamin B12 deficiency 08/18/2022   Tremor 08/14/2021   History of atrial fibrillation 08/03/2020   Bilateral dry eyes 03/28/2020   Vitreous membranes and strands, right 03/28/2020   Nuclear sclerotic cataract of right eye 03/28/2020   Nuclear sclerotic cataract of left eye 03/28/2020   Posterior vitreous detachment of left eye 03/28/2020   Posterior vitreous detachment of right eye 03/28/2020   Hyperglycemia 02/18/2019   S/P gastric bypass 07/09/2018   Achilles tendinosis 04/30/2018   Spondylosis of lumbar joint 05/28/2017   Plantar fasciitis, bilateral 05/28/2017   Chronic pain of right wrist 05/28/2017   Haglund's deformity of right heel 02/02/2017   Postmenopausal symptoms 06/02/2016   Cervical spondylosis 07/19/2014   GERD (gastroesophageal reflux disease) 07/19/2014   Vitamin D deficiency 07/19/2014   History of colonic polyps 11/23/2013   Allergic rhinitis 11/11/2011   REFERRING PROVIDER: Tressie Stalker MD **Note De-Identified Meister Obfuscation** REFERRING DIAG: Cervicalgia  THERAPY DIAG:  Cervicalgia  Other muscle spasm  Rationale for Evaluation and Treatment: Rehabilitation  ONSET DATE: Mid-December 2024.  SUBJECTIVE:                                                                                                                                                                                                         SUBJECTIVE STATEMENT: No better.  PERTINENT HISTORY:  2 prior cervical fusion surgeries.    PAIN:  Are you having pain? Yes: NPRS scale: 5/10. Pain location: Bilateral neck. Pain description: Ache,  sore, throbbing. Aggravating factors: As above. Relieving factors: As above.  PRECAUTIONS: None  RED FLAGS: None     WEIGHT BEARING RESTRICTIONS: No  FALLS:  Has patient fallen in last 6 months? 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   OBJECTIVE:  Note: Objective measures were completed at Evaluation unless otherwise noted.  DIAGNOSTIC FINDINGS:  IMPRESSION: 1. No acute intracranial abnormality. 2. Unchanged postoperative appearance from prior C4-5 and C6-7 ACDF. 3. Adjacent segment degeneration at C5-6 results in moderate to severe spinal canal stenosis and severe bilateral neural foraminal narrowing. 4. Severe left and moderate right neural foraminal narrowing at C3-4 and C7-T1.  PATIENT SURVEYS:  FOTO 45.71.  POSTURE: rounded shoulders and forward head  PALPATION: Diffuse bilateral tenderness and increased tone over cervical paraspinal and UT musculature.     CERVICAL ROM:   Active right cervical rotation is 42 degrees and left is 30#.  UPPER EXTREMITY ROM:  WNL.  UPPER EXTREMITY MMT: Normal UE strength.    DTR'S:  Normal UE DTR's.  TREATMENT DATE:   08/20/23:   UBE x 8 minutes f/b Combo e'stim/US at 1.50 W/CM2 x 7 minutes f/b STW/M x 8 minutes with ischemic release technique to left UT f/b HMP and IFC at 80-150 Hz on 40% scan x 20 minutes.  Normal modality response following removal of modality.                PATIENT EDUCATION:  Education details: Chin tucks and cervical extension Person educated: patient Education method: verbal, and handout of HEP Education comprehension: patient reported understanding  HOME EXERCISE PROGRAM:  HOME PROGRAM HOME EXERCISE PROGRAM Created by [place] Jeremaih Klima Feb 20th, 2025 View at  [email or web address] using code: [email or web address]  Page 1 of 1 2 Exercises RETRACTION / CHIN TUCK Slowly draw your **Note De-Identified Witherell Obfuscation** head back so that your ears line up with your shoulders. Repeat 10 Times Hold 5 Seconds Complete 1 Set Perform 6 Times a Day CERVICAL EXTENSION WITH TOWEL - CURVE OF NECK Start with a small hand towel wrapped around the curve of your neck and holding the ends of the towel forward as shown. Next, extend your neck back over the towel as to look up at the ceiling. Then, return to starting position.  Your hands should remain still and holding the ends of the towel the entire time. Repeat 5 Times Hold 2 Seconds Complete 1 Set Perform 3 Times a Day  ASSESSMENT:  CLINICAL IMPRESSION: Patient was very motivated during all her PT session and compliant to her HEP.  She had 10 session and unfortunately did not have any significant lasting improvement.     OBJECTIVE IMPAIRMENTS: decreased activity tolerance, decreased ROM, increased muscle spasms, postural dysfunction, and pain.   ACTIVITY LIMITATIONS: carrying and lifting  PARTICIPATION LIMITATIONS: meal prep, cleaning, laundry, and occupation  PERSONAL FACTORS: 1 comorbidity: 2 prior neck surgeries  are also affecting patient's functional outcome.   REHAB POTENTIAL: Good  CLINICAL DECISION MAKING: Evolving/moderate complexity  EVALUATION COMPLEXITY: Low   GOALS: LONG TERM GOALS: Target date: 09/01/23  Ind with a HEP.  Goal status: MET. 2.  Improve bilateral cervical rotation to 50-55 degrees. Goal status: UNMET  3.  Perform ADL's with neck pain not > 3/10.  Goal status: UNMET  PLAN:  PT FREQUENCY: 2x/week  PT DURATION: 6 weeks  PLANNED INTERVENTIONS: 97110-Therapeutic exercises, 97530- Therapeutic activity, [id number]- Neuromuscular re-education, 97535- Self Care, 96295- Manual therapy, 97014- Electrical stimulation (unattended), 97035- Ultrasound, Patient/Family education, Dry Needling, Cryotherapy, and Moist  heat  PLAN FOR NEXT SESSION: Combo e'stim/US at 1.50 W/CM2, STW/M, postural exercises.     Sache Sane, [place], PT 08/19/2023, 2:10 PM

## 2023-08-20 ENCOUNTER — Encounter: Payer: Self-pay | Admitting: Family Medicine

## 2023-08-20 ENCOUNTER — Ambulatory Visit (INDEPENDENT_AMBULATORY_CARE_PROVIDER_SITE_OTHER): Payer: BC Managed Care – PPO | Admitting: Family Medicine

## 2023-08-20 VITALS — BP 134/82 | HR 77 | Temp 97.9°F | Ht 64.0 in | Wt 188.0 lb

## 2023-08-20 DIAGNOSIS — Z0000 Encounter for general adult medical examination without abnormal findings: Secondary | ICD-10-CM | POA: Diagnosis not present

## 2023-08-20 DIAGNOSIS — Z9884 Bariatric surgery status: Secondary | ICD-10-CM | POA: Diagnosis not present

## 2023-08-20 DIAGNOSIS — R739 Hyperglycemia, unspecified: Secondary | ICD-10-CM

## 2023-08-20 DIAGNOSIS — E559 Vitamin D deficiency, unspecified: Secondary | ICD-10-CM | POA: Diagnosis not present

## 2023-08-20 DIAGNOSIS — M47812 Spondylosis without myelopathy or radiculopathy, cervical region: Secondary | ICD-10-CM

## 2023-08-20 DIAGNOSIS — M545 Low back pain, unspecified: Secondary | ICD-10-CM

## 2023-08-20 DIAGNOSIS — E669 Obesity, unspecified: Secondary | ICD-10-CM | POA: Insufficient documentation

## 2023-08-20 DIAGNOSIS — Z131 Encounter for screening for diabetes mellitus: Secondary | ICD-10-CM

## 2023-08-20 DIAGNOSIS — N959 Unspecified menopausal and perimenopausal disorder: Secondary | ICD-10-CM

## 2023-08-20 DIAGNOSIS — E538 Deficiency of other specified B group vitamins: Secondary | ICD-10-CM

## 2023-08-20 LAB — CBC WITH DIFFERENTIAL/PLATELET
Basophils Absolute: 0 10*3/uL (ref 0.0–0.1)
Basophils Relative: 0.3 % (ref 0.0–3.0)
Eosinophils Absolute: 0.1 10*3/uL (ref 0.0–0.7)
Eosinophils Relative: 1.1 % (ref 0.0–5.0)
HCT: 37.3 % (ref 36.0–46.0)
Hemoglobin: 12.1 g/dL (ref 12.0–15.0)
Lymphocytes Relative: 26.7 % (ref 12.0–46.0)
Lymphs Abs: 1.6 10*3/uL (ref 0.7–4.0)
MCHC: 32.4 g/dL (ref 30.0–36.0)
MCV: 90 fl (ref 78.0–100.0)
Monocytes Absolute: 0.4 10*3/uL (ref 0.1–1.0)
Monocytes Relative: 6.7 % (ref 3.0–12.0)
Neutro Abs: 3.8 10*3/uL (ref 1.4–7.7)
Neutrophils Relative %: 65.2 % (ref 43.0–77.0)
Platelets: 247 10*3/uL (ref 150.0–400.0)
RBC: 4.14 Mil/uL (ref 3.87–5.11)
RDW: 15.8 % — ABNORMAL HIGH (ref 11.5–15.5)
WBC: 5.9 10*3/uL (ref 4.0–10.5)

## 2023-08-20 LAB — COMPREHENSIVE METABOLIC PANEL
ALT: 14 U/L (ref 0–35)
AST: 15 U/L (ref 0–37)
Albumin: 4.1 g/dL (ref 3.5–5.2)
Alkaline Phosphatase: 112 U/L (ref 39–117)
BUN: 22 mg/dL (ref 6–23)
CO2: 26 meq/L (ref 19–32)
Calcium: 9.3 mg/dL (ref 8.4–10.5)
Chloride: 107 meq/L (ref 96–112)
Creatinine, Ser: 0.64 mg/dL (ref 0.40–1.20)
GFR: 89.56 mL/min (ref >60.00)
Glucose, Bld: 114 mg/dL — ABNORMAL HIGH (ref 70–99)
Potassium: 3.7 meq/L (ref 3.5–5.1)
Sodium: 140 meq/L (ref 135–145)
Total Bilirubin: 0.5 mg/dL (ref 0.2–1.2)
Total Protein: 6.5 g/dL (ref 6.0–8.3)

## 2023-08-20 LAB — TSH: TSH: 2.62 u[IU]/mL (ref 0.35–5.50)

## 2023-08-20 LAB — LIPID PANEL
Cholesterol: 142 mg/dL (ref 0–200)
HDL: 67 mg/dL (ref >39.00)
LDL Cholesterol: 62 mg/dL (ref 0–99)
NonHDL: 75.08
Total CHOL/HDL Ratio: 2
Triglycerides: 65 mg/dL (ref 0.0–149.0)
VLDL: 13 mg/dL (ref 0.0–40.0)

## 2023-08-20 LAB — IBC + FERRITIN
Ferritin: 14.3 ng/mL (ref 10.0–291.0)
Iron: 84 ug/dL (ref 42–145)
Saturation Ratios: 18.6 % — ABNORMAL LOW (ref 20.0–50.0)
TIBC: 450.8 ug/dL — ABNORMAL HIGH (ref 250.0–450.0)
Transferrin: 322 mg/dL (ref 212.0–360.0)

## 2023-08-20 LAB — VITAMIN B12: Vitamin B-12: 699 pg/mL (ref 211–911)

## 2023-08-20 LAB — HEMOGLOBIN A1C: Hgb A1c MFr Bld: 5.8 % (ref 4.6–6.5)

## 2023-08-20 LAB — VITAMIN D 25 HYDROXY (VIT D DEFICIENCY, FRACTURES): VITD: 36.72 ng/mL (ref 30.00–100.00)

## 2023-08-20 MED ORDER — CITALOPRAM HYDROBROMIDE 40 MG PO TABS: 40.0000 mg | ORAL_TABLET | Freq: Every day | ORAL | 3 refills | Status: AC

## 2023-08-20 MED ORDER — VITAMIN D (ERGOCALCIFEROL) 1.25 MG (50000 UNIT) PO CAPS: ORAL_CAPSULE | ORAL | 3 refills | Status: DC

## 2023-08-20 MED ORDER — ESTRADIOL 0.5 MG PO TABS: 0.5000 mg | ORAL_TABLET | Freq: Every day | ORAL | 3 refills | Status: DC

## 2023-08-20 MED ORDER — PROPRANOLOL HCL ER 60 MG PO CP24: 60.0000 mg | ORAL_CAPSULE | Freq: Every day | ORAL | 3 refills | Status: AC

## 2023-08-20 MED ORDER — ESTRADIOL 0.1 MG/GM VA CREA: 1.0000 | TOPICAL_CREAM | Freq: Every day | VAGINAL | 11 refills | Status: AC

## 2023-08-20 NOTE — Assessment & Plan Note (Signed)
**Note De-Identified Trias Obfuscation** Overall stable on celexa 40 mg daily, vaginal estrace, and oral estradiol that she uses very sparingly as needed.

## 2023-08-20 NOTE — Patient Instructions (Addendum)
**Note De-Identified [place]rew Obfuscation** It was very nice to see you today!  We will check blood work today.  We will complete your paperwork.   Check with your insurance to see if they will pay for Qsymia, Zepbound, or [facility] for weight loss.   Return in about 1 year (around 08/19/2024) for Annual Physical.   Take care, Dr Jimmey Ralph  PLEASE NOTE:  If you had any lab tests, please let us know if you have not heard back within a few days. You may see your results on mychart before we have a chance to review them but we will give you a call once they are reviewed by [place].   If we ordered any referrals today, please let us know if you have not heard from their office within the next week.   If you had any urgent prescriptions sent in today, please check with the pharmacy within an hour of our visit to make sure the prescription was transmitted appropriately.   Please try these tips to maintain a healthy lifestyle:  Eat at least 3 REAL meals and 1-2 snacks per day.  Aim for no more than 5 hours between eating.  If you eat breakfast, please do so within one hour of getting up.   Each meal should contain half fruits/vegetables, one quarter protein, and one quarter carbs (no bigger than a computer mouse)  Cut down on sweet beverages. This includes juice, soda, and sweet tea.   Drink at least 1 glass of water with each meal and aim for at least 8 glasses per day  Exercise at least 150 minutes every week.    Preventive Care 56 Years and Older, Female Preventive care refers to lifestyle choices and visits with your health care provider that can promote health and wellness. Preventive care visits are also called wellness exams. What can I expect for my preventive care visit? Counseling Your health care provider may ask you questions about your: Medical history, including: Past medical problems. Family medical history. Pregnancy and menstrual history. History of falls. Current health, including: Memory and ability to understand  (cognition). Emotional well-being. Home life and relationship well-being. Sexual activity and sexual health. Lifestyle, including: Alcohol, nicotine or tobacco, and drug use. Access to firearms. Diet, exercise, and sleep habits. Work and work Astronomer. Sunscreen use. Safety issues such as seatbelt and bike helmet use. Physical exam Your health care provider will check your: Height and weight. These may be used to calculate your BMI (body mass index). BMI is a measurement that tells if you are at a healthy weight. Waist circumference. This measures the distance around your waistline. This measurement also tells if you are at a healthy weight and may help predict your risk of certain diseases, such as type 2 diabetes and high blood pressure. Heart rate and blood pressure. Body temperature. Skin for abnormal spots. What immunizations do I need?  Vaccines are usually given at various ages, according to a schedule. Your health care provider will recommend vaccines for you based on your age, medical history, and lifestyle or other factors, such as travel or where you work. What tests do I need? Screening Your health care provider may recommend screening tests for certain conditions. This may include: Lipid and cholesterol levels. Hepatitis C test. Hepatitis B test. HIV (human immunodeficiency virus) test. STI (sexually transmitted infection) testing, if you are at risk. Lung cancer screening. Colorectal cancer screening. Diabetes screening. This is done by checking your blood sugar (glucose) after you have not eaten for a **Note De-Identified Rathod Obfuscation** while (fasting). Mammogram. Talk with your health care provider about how often you should have regular mammograms. BRCA-related cancer screening. This may be done if you have a family history of breast, ovarian, tubal, or peritoneal cancers. Bone density scan. This is done to screen for osteoporosis. Talk with your health care provider about your test results,  treatment options, and if necessary, the need for more tests. Follow these instructions at home: Eating and drinking  Eat a diet that includes fresh fruits and vegetables, whole grains, lean protein, and low-fat dairy products. Limit your intake of foods with high amounts of sugar, saturated fats, and salt. Take vitamin and mineral supplements as recommended by your health care provider. Do not drink alcohol if your health care provider tells you not to drink. If you drink alcohol: Limit how much you have to 0-1 drink a day. Know how much alcohol is in your drink. In the [place] one drink equals one 12 oz bottle of beer (355 mL), one 5 oz glass of wine (148 mL), or one 1 oz glass of hard liquor (44 mL). Lifestyle Brush your teeth every morning and night with fluoride toothpaste. Floss one time each day. Exercise for at least 30 minutes 5 or more days each week. Do not use any products that contain nicotine or tobacco. These products include cigarettes, chewing tobacco, and vaping devices, such as e-cigarettes. If you need help quitting, ask your health care provider. Do not use drugs. If you are sexually active, practice safe sex. Use a condom or other form of protection in order to prevent STIs. Take aspirin only as told by your health care provider. Make sure that you understand how much to take and what form to take. Work with your health care provider to find out whether it is safe and beneficial for you to take aspirin daily. Ask your health care provider if you need to take a cholesterol-lowering medicine (statin). Find healthy ways to manage stress, such as: Meditation, yoga, or listening to music. Journaling. Talking to a trusted person. Spending time with friends and family. Minimize exposure to UV radiation to reduce your risk of skin cancer. Safety Always wear your seat belt while driving or riding in a vehicle. Do not drive: If you have been drinking alcohol. Do not ride with  someone who has been drinking. When you are tired or distracted. While texting. If you have been using any mind-altering substances or drugs. Wear a helmet and other protective equipment during sports activities. If you have firearms in your house, make sure you follow all gun safety procedures. What's next? Visit your health care provider once a year for an annual wellness visit. Ask your health care provider how often you should have your eyes and teeth checked. Stay up to date on all vaccines. This information is not intended to replace advice given to you by your health care provider. Make sure you discuss any questions you have with your health care provider. Document Revised: 11/28/2020 Document Reviewed: 11/28/2020 Elsevier Patient Education  2024 [employer].

## 2023-08-20 NOTE — Assessment & Plan Note (Signed)
Discussed lifestyle modifications. Check A1c.

## 2023-08-20 NOTE — Assessment & Plan Note (Signed)
**Note De-Identified Borbon Obfuscation** She is following with neurosurgery for this.  Pain is still not controlled.  She is also been working with physical therapy as well without much improvement.  She has upcoming myelogram for next steps in management.  She has to return to work and request that we filled out a return to work paper today.  Will complete this per patient request.  Will defer further management to neurosurgery though she will let us know if she needs any further assistance.

## 2023-08-20 NOTE — Assessment & Plan Note (Signed)
 Check vitamin D.

## 2023-08-20 NOTE — Assessment & Plan Note (Signed)
**Note De-Identified [place]mer[place] Obfuscation** BMI 32 with comorbidities.  She is working on diet and exercise.  She would be a candidate for medical weight loss due to her comorbidities.  We had tried Ozempic in the past however insurance would not pay for this.  She will let us know if her insurance will pay for Qsymia, Wegovy, or Zepbound.

## 2023-08-20 NOTE — Progress Notes (Signed)
**Note De-Identified Ambs Obfuscation** Chief Complaint:  Felicia Schultz is a 73 y.o. female who presents today for her annual comprehensive physical exam.    Assessment/Plan:  Chronic Problems Addressed Today: Vitamin B12 deficiency Check B12.   Hyperglycemia Discussed lifestyle modifications. Check A1c.   Vitamin D deficiency Check vitamin D.   Postmenopausal symptoms Overall stable on celexa 40 mg daily, vaginal estrace, and oral estradiol that she uses very sparingly as needed.   Cervical spondylosis She is following with neurosurgery for this.  Pain is still not controlled.  She is also been working with physical therapy as well without much improvement.  She has upcoming myelogram for next steps in management.  She has to return to work and request that we filled out a return to work paper today.  Will complete this per patient request.  Will defer further management to neurosurgery though she will let us know if she needs any further assistance.  Obesity BMI 32 with comorbidities.  She is working on diet and exercise.  She would be a candidate for medical weight loss due to her comorbidities.  We had tried Ozempic in the past however insurance would not pay for this.  She will let us know if her insurance will pay for Qsymia, Wegovy, or Zepbound.  Preventative Healthcare: Check labs.  Up-to-date on vaccines.  Patient Counseling(The following topics were reviewed and/or handout was given):  -Nutrition: Stressed importance of moderation in sodium/caffeine intake, saturated fat and cholesterol, caloric balance, sufficient intake of fresh fruits, vegetables, and fiber.  -Stressed the importance of regular exercise.   -Substance Abuse: Discussed cessation/primary prevention of tobacco, alcohol, or other drug use; driving or other dangerous activities under the influence; availability of treatment for abuse.   -Injury prevention: Discussed safety belts, safety helmets, smoke detector, smoking near bedding or upholstery.    -Sexuality: Discussed sexually transmitted diseases, partner selection, use of condoms, avoidance of unintended pregnancy and contraceptive alternatives.   -Dental health: Discussed importance of regular tooth brushing, flossing, and dental visits.  -Health maintenance and immunizations reviewed. Please refer to Health maintenance section.  Return to care in 1 year for next preventative visit.     Subjective:  HPI:  She has no acute complaints today. See Assessment / plan for status of chronic conditions. Since our last visit she has been following with neurosurgery for her cervical radiculopathy. She has been also been working physical therapy. She will be having myelogram soon.  Pain is still persistent.  She requests [place] complete a return to work protocol.  Lifestyle Diet: Balanced. Plenty of fruits and vegetables.  Exercise: Limited due to neck issues.      08/20/2023    9:06 AM  Depression screen PHQ 2/9  Decreased Interest 0  Down, Depressed, Hopeless 0  PHQ - 2 Score 0    Health Maintenance Due  Topic Date Due   DEXA SCAN  Never done   MAMMOGRAM  09/15/2020     ROS: Per HPI, otherwise a complete review of systems was negative.   PMH:  The following were reviewed and entered/updated in epic: Past Medical History:  Diagnosis Date   Allergy    Anemia    Atrial flutter (HCC)    typical appearing   Basal cell carcinoma     nose - 1985   GERD (gastroesophageal reflux disease)    GI bleed    H/O cardiac radiofrequency ablation 2015   H/O gastric bypass 2005   History of diabetes mellitus 11/13/2015 **Note De-Identified Levi Obfuscation** Hypertension    Hypokalemia    Osteoporosis 2000   Paroxysmal atrial fibrillation (HCC)    Personal history of colonic polyps - sessile serrated 11/23/2013   Patient Active Problem List   Diagnosis Date Noted   Obesity 08/20/2023   Thyroid nodule 06/30/2023   Vitamin B12 deficiency 08/18/2022   Tremor 08/14/2021   History of atrial fibrillation 08/03/2020    Bilateral dry eyes 03/28/2020   Vitreous membranes and strands, right 03/28/2020   Nuclear sclerotic cataract of right eye 03/28/2020   Nuclear sclerotic cataract of left eye 03/28/2020   Posterior vitreous detachment of left eye 03/28/2020   Posterior vitreous detachment of right eye 03/28/2020   Hyperglycemia 02/18/2019   S/P gastric bypass 07/09/2018   Achilles tendinosis 04/30/2018   Spondylosis of lumbar joint 05/28/2017   Plantar fasciitis, bilateral 05/28/2017   Chronic pain of right wrist 05/28/2017   Haglund's deformity of right heel 02/02/2017   Postmenopausal symptoms 06/02/2016   Cervical spondylosis 07/19/2014   GERD (gastroesophageal reflux disease) 07/19/2014   Vitamin D deficiency 07/19/2014   History of colonic polyps 11/23/2013   Allergic rhinitis 11/11/2011   Past Surgical History:  Procedure Laterality Date   APPENDECTOMY     ATRIAL FLUTTER ABLATION N/A 09/05/2014   Procedure: ATRIAL FLUTTER ABLATION;  Surgeon: Hillis Range, MD;  Location: [facility] CATH LAB;  Service: Cardiovascular;  Laterality: N/A;   BIOPSY  06/23/2023   Procedure: BIOPSY;  Surgeon: Lemar Lofty., MD;  Location: [facility];  Service: Gastroenterology;;   BREAST EXCISIONAL BIOPSY Right 01/2016   CARPAL TUNNEL RELEASE Bilateral    CHOLECYSTECTOMY     ENTEROSCOPY N/A 06/23/2023   Procedure: ENTEROSCOPY;  Surgeon: Lemar Lofty., MD;  Location: [facility];  Service: Gastroenterology;  Laterality: N/A;   FOOT SURGERY Bilateral 2012   shorten bones in both feet   GASTRIC BYPASS     removal of ovary Right 1987   SPINE SURGERY  2000/2012   anterior cervical fusion with plating   SUBMUCOSAL TATTOO INJECTION  06/23/2023   Procedure: SUBMUCOSAL TATTOO INJECTION;  Surgeon: Lemar Lofty., MD;  Location: [facility];  Service: Gastroenterology;;   TOTAL ABDOMINAL HYSTERECTOMY  1999   WRIST SURGERY Right 2018   Dr. Orlan Leavens -ganglion excision    Family History  Problem  Relation Age of Onset   Diabetes Mother    Heart disease Mother    Arthritis Mother    Hearing loss Mother    Hyperlipidemia Mother    Hypertension Mother    Kidney disease Mother    Learning disabilities Mother    Miscarriages / [place] Mother    Heart disease Father    Hypertension Father    Alcohol abuse Father    Hyperlipidemia Father    Scleroderma Sister    Early death Sister        schlederma   Miscarriages / Stillbirths Sister    Emphysema Sister    Diabetes Sister    Hyperlipidemia Sister    Hypertension Sister    Mental illness Sister    Miscarriages / [place] Sister    Stroke Sister    Diabetes Sister    Heart disease Brother    Cancer Brother        lung   Stroke Brother    Lung cancer Brother        smoker   Diabetes Brother    Emphysema Maternal Grandmother    Cancer Maternal Grandmother **Note De-Identified Fagin Obfuscation** throat and lung(smoker)   Esophageal cancer Maternal Grandmother    Colon cancer Maternal Grandfather    Cancer Maternal Grandfather        colon   Pancreatic cancer Neg Hx    Rectal cancer Neg Hx    Stomach cancer Neg Hx     Medications- reviewed and updated Current Outpatient Medications  Medication Sig Dispense Refill   Cyanocobalamin (VITAMIN B 12 PO) Take 1 tablet by mouth daily.     cyclobenzaprine (FLEXERIL) 10 MG tablet Take 10 mg by mouth 3 (three) times daily as needed for muscle spasms.     ferrous sulfate 325 (65 FE) MG tablet Take 325 mg by mouth daily.     gabapentin (NEURONTIN) 100 MG capsule Take by mouth.     gabapentin (NEURONTIN) 300 MG capsule Take 300 mg by mouth 3 (three) times daily as needed.     HYDROcodone-acetaminophen (NORCO/VICODIN) 5-325 MG tablet Take 1 tablet by mouth every 6 (six) hours as needed for moderate pain (pain score 4-6). 30 tablet 0   Multiple Vitamins-Minerals (MULTIVITAMIN GUMMIES ADULT PO) Take 1 tablet by mouth daily.     omeprazole (PRILOSEC) 40 MG capsule Take 1 capsule (40 mg total) by mouth 2 (two)  times daily before a meal. 30 mins before breakfast and supper 180 capsule 1   sucralfate (CARAFATE) 1 GM/10ML suspension Take 10 mLs (1 g total) by mouth 4 (four) times daily -  with meals and at bedtime. 420 mL 0   citalopram (CELEXA) 40 MG tablet Take 1 tablet (40 mg total) by mouth daily. 90 tablet 3   estradiol (ESTRACE) 0.1 MG/GM vaginal cream Place 1 Applicatorful vaginally at bedtime. 42.5 g 11   estradiol (ESTRACE) 0.5 MG tablet Take 1 tablet (0.5 mg total) by mouth daily. 90 tablet 3   propranolol ER (INDERAL LA) 60 MG 24 hr capsule Take 1 capsule (60 mg total) by mouth daily. 90 capsule 3   Vitamin D, Ergocalciferol, (DRISDOL) 1.25 MG (50000 UNIT) CAPS capsule Take 50000IU once weekly. 12 capsule 3   No current facility-administered medications for this visit.    Allergies-reviewed and updated Allergies  Allergen Reactions   Amoxicillin-Pot Clavulanate Nausea Only    Severe nausea. Couldn't keep medication down  Severe nausea. Couldn't keep medication down   Clarithromycin Hives   Nitrofurantoin Other (See Comments) and Rash    Unknown Palms and hand itching, turned bright red per pt   Sulfonamide Derivatives Other (See Comments)    Abdominal cramping, nausea   Azithromycin Other (See Comments)    Z-pack, doesn't do anything Z-pack, doesn't do anything Z-pack, doesn't do anything Z-pack, doesn't do anything    Sulfa Antibiotics Nausea And Vomiting and Other (See Comments)    Unknown    Social History   Socioeconomic History   Marital status: Married    Spouse name: Not on file   Number of children: 0   Years of education: Not on file   Highest education level: Not on file  Occupational History   Occupation: Designer, fashion/clothing: [employer]  Tobacco Use   Smoking status: Never   Smokeless tobacco: Never  Vaping Use   Vaping status: Never Used  Substance and Sexual Activity   Alcohol use: No   Drug use: No   Sexual activity: Not on  file  Other Topics Concern   Not on file  Social History Narrative   Married, no children   Therapist, sports **Note De-Identified Iles Obfuscation** for [employer]   Lives in [place].   1-2 caffeine/day   Social Drivers of Corporate investment banker Strain: Not on file  Food Insecurity: No Food Insecurity (06/24/2023)   Hunger Vital Sign    Worried About Running Out of Food in the Last Year: Never true    Ran Out of Food in the Last Year: Never true  Transportation Needs: No Transportation Needs (06/24/2023)   PRAPARE - Administrator, Civil Service (Medical): No    Lack of Transportation (Non-Medical): No  Physical Activity: Not on file  Stress: Not on file  Social Connections: Moderately Integrated (06/24/2023)   Social Connection and Isolation Panel [NHANES]    Frequency of Communication with Friends and Family: More than three times a week    Frequency of Social Gatherings with Friends and Family: Once a week    Attends Religious Services: 1 to 4 times per year    Active Member of [employer] or Organizations: No    Attends Engineer, structural: Never    Marital Status: Married        Objective:  Physical Exam: BP 134/82   Pulse 77   Temp 97.9 F (36.6 C) (Temporal)   Ht 5\' 4"  (1.626 m)   Wt 188 lb (85.3 kg)   SpO2 96%   BMI 32.27 kg/m   Body mass index is 32.27 kg/m. Wt Readings from Last 3 Encounters:  08/20/23 188 lb (85.3 kg)  07/09/23 186 lb (84.4 kg)  06/30/23 186 lb (84.4 kg)   Gen: NAD, resting comfortably HEENT: TMs normal bilaterally. OP clear. No thyromegaly noted.  CV: RRR with no murmurs appreciated Pulm: NWOB, CTAB with no crackles, wheezes, or rhonchi GI: Normal bowel sounds present. Soft, Nontender, Nondistended. MSK: no edema, cyanosis, or clubbing noted Skin: warm, dry Neuro: CN2-12 grossly intact. Strength 5/5 in upper and lower extremities. Reflexes symmetric and intact bilaterally.  Psych: Normal affect and thought content     Joslyne Marshburn M. Jimmey Ralph,  MD 08/20/2023 10:10 AM

## 2023-08-20 NOTE — Assessment & Plan Note (Signed)
 Check B12

## 2023-08-24 ENCOUNTER — Other Ambulatory Visit: Payer: Self-pay | Admitting: Neurosurgery

## 2023-08-24 ENCOUNTER — Encounter: Payer: Self-pay | Admitting: Family Medicine

## 2023-08-24 DIAGNOSIS — M4302 Spondylolysis, cervical region: Secondary | ICD-10-CM

## 2023-08-24 DIAGNOSIS — M47816 Spondylosis without myelopathy or radiculopathy, lumbar region: Secondary | ICD-10-CM

## 2023-08-24 NOTE — Progress Notes (Signed)
**Note De-Identified Yandell Obfuscation** Her iron is on the lower range of normal but much better than it was a month ago.  She can continue iron supplementation we can recheck this again at her next office visit.  Her blood sugar is borderline but better than the previous times we have checked.  The rest of her labs are all at goal.  Do not need to make any other changes to her treatment plan at this time.  She should continue to work on diet and exercise and we can recheck everything in a year or so.

## 2023-08-31 NOTE — Discharge Instructions (Signed)

## 2023-09-01 ENCOUNTER — Ambulatory Visit
Admission: RE | Admit: 2023-09-01 | Discharge: 2023-09-01 | Disposition: A | Discharge status: Home / Self Care | Source: Ambulatory Visit | Attending: Neurosurgery | Admitting: Neurosurgery

## 2023-09-01 DIAGNOSIS — M47816 Spondylosis without myelopathy or radiculopathy, lumbar region: Secondary | ICD-10-CM

## 2023-09-01 DIAGNOSIS — M4302 Spondylolysis, cervical region: Secondary | ICD-10-CM

## 2023-09-01 MED ORDER — IOPAMIDOL (ISOVUE-M 300) INJECTION 61%
10.0000 mL | Freq: Once | INTRAMUSCULAR | Status: AC
  Administered 2023-09-01: 10 mL via INTRATHECAL

## 2023-09-01 MED ORDER — MEPERIDINE HCL 50 MG/ML IJ SOLN: 50.0000 mg | Freq: Once | INTRAMUSCULAR | Status: DC | PRN

## 2023-09-01 MED ORDER — DIAZEPAM 5 MG PO TABS
5.0000 mg | ORAL_TABLET | Freq: Once | ORAL | Status: AC
  Administered 2023-09-01: 5 mg via ORAL

## 2023-09-01 MED ORDER — ONDANSETRON HCL 4 MG/2ML IJ SOLN: 4.0000 mg | Freq: Once | INTRAMUSCULAR | Status: DC | PRN

## 2023-09-08 ENCOUNTER — Encounter: Payer: Self-pay | Admitting: Internal Medicine

## 2023-09-10 ENCOUNTER — Encounter: Payer: Self-pay | Admitting: Family Medicine

## 2023-09-10 DIAGNOSIS — N6011 Diffuse cystic mastopathy of right breast: Secondary | ICD-10-CM

## 2023-09-11 ENCOUNTER — Ambulatory Visit (INDEPENDENT_AMBULATORY_CARE_PROVIDER_SITE_OTHER): Admitting: Family Medicine

## 2023-09-11 ENCOUNTER — Encounter: Payer: Self-pay | Admitting: Family Medicine

## 2023-09-11 VITALS — BP 128/77 | HR 74 | Temp 98.4°F | Ht 64.0 in | Wt 187.8 lb

## 2023-09-11 DIAGNOSIS — E2839 Other primary ovarian failure: Secondary | ICD-10-CM

## 2023-09-11 DIAGNOSIS — M47812 Spondylosis without myelopathy or radiculopathy, cervical region: Secondary | ICD-10-CM

## 2023-09-11 DIAGNOSIS — N6310 Unspecified lump in the right breast, unspecified quadrant: Secondary | ICD-10-CM | POA: Diagnosis not present

## 2023-09-11 NOTE — Progress Notes (Signed)
**Note De-identified Appelt Obfuscation**   **Note De-Identified Haaland Obfuscation** Erla Bacchi Chiang is a 73 y.o. female who presents today for an office visit.  Assessment/Plan:  New/Acute Problems: Breast Mass Approximately 1 to 2 cm nodule noted on right breast today.  She did have benign papilloma several years ago and may have recurrence though needs further workup.  Will order diagnostic mammogram and ultrasound.  Chronic Problems Addressed Today: Cervical spondylosis Following with neurosurgery for this.  Famous do not control.  She did have a recent myelogram which showed  foraminal stenosis.  She will follow-up with them soon.  She is currently taking Tylenol as needed.  Will complete her FMLA paperwork today.     Subjective:  HPI:  See Assessment / plan for status of chronic conditions.  Patient is here with lump on her right breast. Noticed it a few days ago. Increasing in size the last few days. She has not noticed any discharge. No pain.       Objective:  Physical Exam: BP 128/77   Pulse 74   Temp 98.4 F (36.9 C) (Temporal)   Ht 5\' 4"  (1.626 m)   Wt 187 lb 12.8 oz (85.2 kg)   SpO2 96%   BMI 32.24 kg/m   Gen: No acute distress, resting comfortably Breast: Chaperone present for exam.  Approximately 1 to 2 cm nodule noted at the 10 o'clock position on right breast. Neuro: Grossly normal, moves all extremities Psych: Normal affect and thought content      Denis Carreon M. Jimmey Ralph, MD 09/11/2023 3:23 PM

## 2023-09-11 NOTE — Assessment & Plan Note (Signed)
**Note De-Identified Cutrona Obfuscation** Following with neurosurgery for this.  Famous do not control.  She did have a recent myelogram which showed  foraminal stenosis.  She will follow-up with them soon.  She is currently taking Tylenol as needed.  Will complete her FMLA paperwork today.

## 2023-09-11 NOTE — Patient Instructions (Signed)
**Note De-Identified Doering Obfuscation** It was very nice to see you today!  We will order a mammogram and ultrasound.   No follow-ups on file.   Take care, Dr Jimmey Ralph  PLEASE NOTE:  If you had any lab tests, please let us know if you have not heard back within a few days. You may see your results on mychart before we have a chance to review them but we will give you a call once they are reviewed by [place].   If we ordered any referrals today, please let us know if you have not heard from their office within the next week.   If you had any urgent prescriptions sent in today, please check with the pharmacy within an hour of our visit to make sure the prescription was transmitted appropriately.   Please try these tips to maintain a healthy lifestyle:  Eat at least 3 REAL meals and 1-2 snacks per day.  Aim for no more than 5 hours between eating.  If you eat breakfast, please do so within one hour of getting up.   Each meal should contain half fruits/vegetables, one quarter protein, and one quarter carbs (no bigger than a computer mouse)  Cut down on sweet beverages. This includes juice, soda, and sweet tea.   Drink at least 1 glass of water with each meal and aim for at least 8 glasses per day  Exercise at least 150 minutes every week.

## 2023-09-14 ENCOUNTER — Encounter: Payer: Self-pay | Admitting: Certified Registered Nurse Anesthetist

## 2023-09-16 ENCOUNTER — Encounter: Payer: Self-pay | Admitting: Family Medicine

## 2023-09-16 NOTE — Progress Notes (Unsigned)
**Note De-Identified Faul[place] Obfuscation** [facility] History and Physical   Primary Care Physician:  Ardith Dark, MD   Reason for Procedure:   Gastrojejunal ulcer f/u  Plan:    EGD     HPI: Felicia Schultz is a 73 y.o. female w/ UGI bleed from gasrojejunal ulcer 06/2023. She is here to reassess for healing of the ulcer.   Past Medical History:  Diagnosis Date   Allergy    Anemia    Atrial flutter (HCC)    typical appearing   Basal cell carcinoma     nose - 1985   GERD (gastroesophageal reflux disease)    GI bleed    H/O cardiac radiofrequency ablation 2015   H/O gastric bypass 2005   History of diabetes mellitus 11/13/2015   Hypertension    Hypokalemia    Osteoporosis 2000   Paroxysmal atrial fibrillation (HCC)    Personal history of colonic polyps - sessile serrated 11/23/2013    Past Surgical History:  Procedure Laterality Date   APPENDECTOMY     ATRIAL FLUTTER ABLATION N/A 09/05/2014   Procedure: ATRIAL FLUTTER ABLATION;  Surgeon: Hillis Range, MD;  Location: [facility] CATH LAB;  Service: Cardiovascular;  Laterality: N/A;   BIOPSY  06/23/2023   Procedure: BIOPSY;  Surgeon: Lemar Lofty., MD;  Location: [facility];  Service: Gastroenterology;;   BREAST EXCISIONAL BIOPSY Right 01/2016   CARPAL TUNNEL RELEASE Bilateral    CHOLECYSTECTOMY     ENTEROSCOPY N/A 06/23/2023   Procedure: ENTEROSCOPY;  Surgeon: Lemar Lofty., MD;  Location: [facility];  Service: Gastroenterology;  Laterality: N/A;   FOOT SURGERY Bilateral 2012   shorten bones in both feet   GASTRIC BYPASS     removal of ovary Right 1987   SPINE SURGERY  2000/2012   anterior cervical fusion with plating   SUBMUCOSAL TATTOO INJECTION  06/23/2023   Procedure: SUBMUCOSAL TATTOO INJECTION;  Surgeon: Lemar Lofty., MD;  Location: [facility];  Service: Gastroenterology;;   TOTAL ABDOMINAL HYSTERECTOMY  1999   WRIST SURGERY Right 2018   Dr. Orlan Leavens -ganglion excision    Prior to Admission medications    Medication Sig Start Date End Date Taking? Authorizing Provider  citalopram (CELEXA) 40 MG tablet Take 1 tablet (40 mg total) by mouth daily. 08/20/23   Ardith Dark, MD  Cyanocobalamin (VITAMIN B 12 PO) Take 1 tablet by mouth daily.    [provider]  cyclobenzaprine (FLEXERIL) 10 MG tablet Take 10 mg by mouth 3 (three) times daily as needed for muscle spasms.    [provider]  estradiol (ESTRACE) 0.1 MG/GM vaginal cream Place 1 Applicatorful vaginally at bedtime. 08/20/23   Ardith Dark, MD  estradiol (ESTRACE) 0.5 MG tablet Take 1 tablet (0.5 mg total) by mouth daily. 08/20/23   Ardith Dark, MD  ferrous sulfate 325 (65 FE) MG tablet Take 325 mg by mouth daily.    [provider]  gabapentin (NEURONTIN) 100 MG capsule Take by mouth. 02/15/19   [provider]  gabapentin (NEURONTIN) 300 MG capsule Take 300 mg by mouth 3 (three) times daily as needed.    [provider]  Multiple Vitamins-Minerals (MULTIVITAMIN GUMMIES ADULT PO) Take 1 tablet by mouth daily.    [provider]  omeprazole (PRILOSEC) 40 MG capsule Take 1 capsule (40 mg total) by mouth 2 (two) times daily before a meal. 30 mins before breakfast and supper 07/09/23   Iva Boop, MD  propranolol ER (INDERAL LA) 60 MG **Note De-Identified [place] Obfuscation** 24 hr capsule Take 1 capsule (60 mg total) by mouth daily. 08/20/23   Ardith Dark, MD  sucralfate (CARAFATE) 1 GM/10ML suspension Take 10 mLs (1 g total) by mouth 4 (four) times daily -  with meals and at bedtime. 06/24/23   Azucena Fallen, MD  Vitamin D, Ergocalciferol, (DRISDOL) 1.25 MG (50000 UNIT) CAPS capsule Take 50000IU once weekly. 08/20/23   Ardith Dark, MD    Current Outpatient Medications  Medication Sig Dispense Refill   citalopram (CELEXA) 40 MG tablet Take 1 tablet (40 mg total) by mouth daily. 90 tablet 3   Cyanocobalamin (VITAMIN B 12 PO) Take 1 tablet by mouth daily.     cyclobenzaprine (FLEXERIL) 10 MG tablet Take 10 mg by mouth 3  (three) times daily as needed for muscle spasms.     estradiol (ESTRACE) 0.1 MG/GM vaginal cream Place 1 Applicatorful vaginally at bedtime. 42.5 g 11   estradiol (ESTRACE) 0.5 MG tablet Take 1 tablet (0.5 mg total) by mouth daily. 90 tablet 3   ferrous sulfate 325 (65 FE) MG tablet Take 325 mg by mouth daily.     gabapentin (NEURONTIN) 100 MG capsule Take by mouth.     gabapentin (NEURONTIN) 300 MG capsule Take 300 mg by mouth 3 (three) times daily as needed.     Multiple Vitamins-Minerals (MULTIVITAMIN GUMMIES ADULT PO) Take 1 tablet by mouth daily.     omeprazole (PRILOSEC) 40 MG capsule Take 1 capsule (40 mg total) by mouth 2 (two) times daily before a meal. 30 mins before breakfast and supper 180 capsule 1   propranolol ER (INDERAL LA) 60 MG 24 hr capsule Take 1 capsule (60 mg total) by mouth daily. 90 capsule 3   sucralfate (CARAFATE) 1 GM/10ML suspension Take 10 mLs (1 g total) by mouth 4 (four) times daily -  with meals and at bedtime. 420 mL 0   Vitamin D, Ergocalciferol, (DRISDOL) 1.25 MG (50000 UNIT) CAPS capsule Take 50000IU once weekly. 12 capsule 3   No current facility-administered medications for this visit.    Allergies as of 09/17/2023 - Review Complete 09/14/2023  Allergen Reaction Noted   Amoxicillin-pot clavulanate Nausea Only 08/13/2013   Clarithromycin Hives 11/09/2009   Nitrofurantoin Other (See Comments) and Rash 03/08/2007   Sulfonamide derivatives Other (See Comments) 03/08/2007   Azithromycin Other (See Comments) 07/25/2014   Sulfa antibiotics Nausea And Vomiting and Other (See Comments) 07/19/2014    Family History  Problem Relation Age of Onset   Diabetes Mother    Heart disease Mother    Arthritis Mother    Hearing loss Mother    Hyperlipidemia Mother    Hypertension Mother    Kidney disease Mother    Learning disabilities Mother    Miscarriages / Stillbirths Mother    Heart disease Father    Hypertension Father    Alcohol abuse Father     Hyperlipidemia Father    Scleroderma Sister    Early death Sister        schlederma   Miscarriages / Stillbirths Sister    Emphysema Sister    Diabetes Sister    Hyperlipidemia Sister    Hypertension Sister    Mental illness Sister    Miscarriages / [place] Sister    Stroke Sister    Diabetes Sister    Heart disease Brother    Cancer Brother        lung   Stroke Brother    Lung cancer Brother **Note De-Identified Cabreja Obfuscation** smoker   Diabetes Brother    Emphysema Maternal Grandmother    Cancer Maternal Grandmother        throat and lung(smoker)   Esophageal cancer Maternal Grandmother    Colon cancer Maternal Grandfather    Cancer Maternal Grandfather        colon   Pancreatic cancer Neg Hx    Rectal cancer Neg Hx    Stomach cancer Neg Hx     Social History   Socioeconomic History   Marital status: Married    Spouse name: Not on file   Number of children: 0   Years of education: Not on file   Highest education level: Not on file  Occupational History   Occupation: Designer, fashion/clothing: [employer]  Tobacco Use   Smoking status: Never   Smokeless tobacco: Never  Vaping Use   Vaping status: Never Used  Substance and Sexual Activity   Alcohol use: No   Drug use: No   Sexual activity: Not on file  Other Topics Concern   Not on file  Social History Narrative   Married, no children   Therapist, sports for [employer].   Lives in [place].   1-2 caffeine/day   Social Drivers of Corporate investment banker Strain: Not on file  Food Insecurity: No Food Insecurity (06/24/2023)   Hunger Vital Sign    Worried About Running Out of Food in the Last Year: Never true    Ran Out of Food in the Last Year: Never true  Transportation Needs: No Transportation Needs (06/24/2023)   PRAPARE - Administrator, Civil Service (Medical): No    Lack of Transportation (Non-Medical): No  Physical Activity: Not on file  Stress: Not on file  Social  Connections: Moderately Integrated (06/24/2023)   Social Connection and Isolation Panel [NHANES]    Frequency of Communication with Friends and Family: More than three times a week    Frequency of Social Gatherings with Friends and Family: Once a week    Attends Religious Services: 1 to 4 times per year    Active Member of [employer] or Organizations: No    Attends Banker Meetings: Never    Marital Status: Married  Catering manager Violence: Not At Risk (06/24/2023)   Humiliation, Afraid, Rape, and Kick questionnaire    Fear of Current or Ex-Partner: No    Emotionally Abused: No    Physically Abused: No    Sexually Abused: No    Review of Systems: Positive for *** All other review of systems negative except as mentioned in the HPI.  Physical Exam: Vital signs There were no vitals taken for this visit.  General:   Alert,  Well-developed, well-nourished, pleasant and cooperative in NAD Lungs:  Clear throughout to auscultation.   Heart:  Regular rate and rhythm; no murmurs, clicks, rubs,  or gallops. Abdomen:  Soft, nontender and nondistended. Normal bowel sounds.   Neuro/Psych:  Alert and cooperative. Normal mood and affect. A and O x 3   @Dequon Schnebly  Sena Slate, MD, [facility] [phone] (pager) 09/16/2023 10:24 PM@

## 2023-09-17 ENCOUNTER — Ambulatory Visit (AMBULATORY_SURGERY_CENTER): Payer: 59 | Admitting: Internal Medicine

## 2023-09-17 ENCOUNTER — Encounter: Payer: Self-pay | Admitting: Internal Medicine

## 2023-09-17 VITALS — BP 142/82 | HR 56 | Temp 98.4°F | Resp 21 | Ht 64.0 in | Wt 186.0 lb

## 2023-09-17 DIAGNOSIS — Z980 Intestinal bypass and anastomosis status: Secondary | ICD-10-CM

## 2023-09-17 DIAGNOSIS — K2289 Other specified disease of esophagus: Secondary | ICD-10-CM | POA: Diagnosis present

## 2023-09-17 DIAGNOSIS — K284 Chronic or unspecified gastrojejunal ulcer with hemorrhage: Secondary | ICD-10-CM

## 2023-09-17 MED ORDER — OMEPRAZOLE 40 MG PO CPDR: 40.0000 mg | DELAYED_RELEASE_CAPSULE | Freq: Every day | ORAL | 3 refills | Status: AC

## 2023-09-17 MED ORDER — SODIUM CHLORIDE 0.9 % IV SOLN: 500.0000 mL | Freq: Once | INTRAVENOUS | Status: DC

## 2023-09-17 NOTE — Patient Instructions (Addendum)
**Note De-Identified Mazor Obfuscation** The ulcer is healed. Might have been the prednisone that caused it. I am reducing the omeprazole to 1 pill a day to reduce risk of recurrent ulcer and bleeding.  I appreciate the opportunity to care for you. Iva Boop, MD, FACG  YOU HAD AN ENDOSCOPIC PROCEDURE TODAY AT THE [facility]:   Refer to the procedure report that was given to you for any specific questions about what was found during the examination.  If the procedure report does not answer your questions, please call your gastroenterologist to clarify.  If you requested that your care partner not be given the details of your procedure findings, then the procedure report has been included in a sealed envelope for you to review at your convenience later.  YOU SHOULD EXPECT: Some feelings of bloating in the abdomen. Passage of more gas than usual.  Walking can help get rid of the air that was put into your GI tract during the procedure and reduce the bloating. If you had a lower endoscopy (such as a colonoscopy or flexible sigmoidoscopy) you may notice spotting of blood in your stool or on the toilet paper. If you underwent a bowel prep for your procedure, you may not have a normal bowel movement for a few days.  Please Note:  You might notice some irritation and congestion in your nose or some drainage.  This is from the oxygen used during your procedure.  There is no need for concern and it should clear up in a day or so.  SYMPTOMS TO REPORT IMMEDIATELY:  Following upper endoscopy (EGD)  Vomiting of blood or coffee ground material  New chest pain or pain under the shoulder blades  Painful or persistently difficult swallowing  New shortness of breath  Fever of 100F or higher  Black, tarry-looking stools  For urgent or emergent issues, a gastroenterologist can be reached at any hour by calling [phone]. Do not use MyChart messaging for urgent concerns.    DIET:  We do recommend a small meal at first, but  then you may proceed to your regular diet.  Drink plenty of fluids but you should avoid alcoholic beverages for 24 hours.  ACTIVITY:  You should plan to take it easy for the rest of today and you should NOT DRIVE or use heavy machinery until tomorrow (because of the sedation medicines used during the test).    FOLLOW UP: Our staff will call the number listed on your records the next business day following your procedure.  We will call around 7:15- 8:00 am to check on you and address any questions or concerns that you may have regarding the information given to you following your procedure. If we do not reach you, we will leave a message.     If any biopsies were taken you will be contacted by phone or by letter within the next 1-3 weeks.  Please call us at [phone] if you have not heard about the biopsies in 3 weeks.    SIGNATURES/CONFIDENTIALITY: You and/or your care partner have signed paperwork which will be entered into your electronic medical record.  These signatures attest to the fact that that the information above on your After Visit Summary has been reviewed and is understood.  Full responsibility of the confidentiality of this discharge information lies with you and/or your care-partner.

## 2023-09-17 NOTE — Progress Notes (Signed)
 Report given to PACU, vss

## 2023-09-17 NOTE — Op Note (Signed)
**Note De-Identified [place] Obfuscation** [facility] Patient Name: Felicia Schultz Procedure Date: 09/17/2023 2:52 PM MRN: [id number] Endoscopist: Iva Boop , MD, [id number] Age: 73 Referring MD:  Date of Birth: [birth year] Gender: Female Account #: [id number] Procedure:                Upper GI endoscopy Indications:              Acute gastrojejunal ulcer, Follow-up of acute                            gastrojejunal ulcer Medicines:                Monitored Anesthesia Care Procedure:                Pre-Anesthesia Assessment:                           - Prior to the procedure, a History and Physical                            was performed, and patient medications and                            allergies were reviewed. The patient's tolerance of                            previous anesthesia was also reviewed. The risks                            and benefits of the procedure and the sedation                            options and risks were discussed with the patient.                            All questions were answered, and informed consent                            was obtained. Prior Anticoagulants: The patient has                            taken no anticoagulant or antiplatelet agents. ASA                            Grade Assessment: II - A patient with mild systemic                            disease. After reviewing the risks and benefits,                            the patient was deemed in satisfactory condition to                            undergo the procedure. **Note De-Identified Ohanian Obfuscation** After obtaining informed consent, the endoscope was                            passed under direct vision. Throughout the                            procedure, the patient's blood pressure, pulse, and                            oxygen saturations were monitored continuously. The                            GIF [id number] #[id number] was introduced through the                            mouth, and advanced to the efferent jejunal  loop.                            The upper GI endoscopy was accomplished without                            difficulty. The patient tolerated the procedure                            well. Scope In: Scope Out: Findings:                 Evidence of a gastric bypass was found. A gastric                            pouch with a 6 cm length from the GE junction to                            the gastrojejunal anastomosis was found. The                            gastrojejunal anastomosis was characterized by                            healthy appearing mucosa. This was traversed.                           The Z-line was irregular.                           The examined jejunum was normal.                           The puch was normal on retroflexion. Complications:            No immediate complications. Estimated Blood Loss:     Estimated blood loss: none. Impression:               - Gastric bypass with a pouch 6 cm in length. **Note De-Identified Borror Obfuscation** Gastrojejunal anastomosis characterized by healthy                            appearing mucosa. Prior ulcer has healed.                           - Z-line irregular.                           - Normal examined jejunum.                           - No specimens collected. Recommendation:           - Patient has a contact number available for                            emergencies. The signs and symptoms of potential                            delayed complications were discussed with the                            patient. Return to normal activities tomorrow.                            Written discharge instructions were provided to the                            patient.                           - Resume previous diet.                           - Continue present medications. REDUCE OMEPRAZOLE                            TO 40 MG QD AND CONTINUE x 1 YEAR                           - Await pathology results. Iva Boop, MD 09/17/2023  3:07:33 PM This report has been signed electronically.

## 2023-09-17 NOTE — Progress Notes (Signed)
1454 Robinul 0.1 mg IV given due large amount of secretions upon assessment.  MD made aware, vss 

## 2023-09-18 ENCOUNTER — Telehealth: Payer: Self-pay | Admitting: *Deleted

## 2023-09-18 NOTE — Telephone Encounter (Signed)
**Note De-identified [place] Obfuscation**  **Note De-Identified [place] Obfuscation** Follow up Call-     09/17/2023    1:42 PM  Call back number  Post procedure Call Back phone  # [phone]  Permission to leave phone message Yes     Patient questions:  Do you have a fever, pain , or abdominal swelling? No. Pain Score  0 *  Have you tolerated food without any problems? Yes.    Have you been able to return to your normal activities? Yes.    Do you have any questions about your discharge instructions: Diet   No. Medications  No. Follow up visit  No.  Do you have questions or concerns about your Care? No.  Actions: * If pain score is 4 or above: No action needed, pain <4.

## 2023-09-29 ENCOUNTER — Other Ambulatory Visit: Payer: Self-pay | Admitting: Neurosurgery

## 2023-10-19 ENCOUNTER — Ambulatory Visit
Admission: RE | Admit: 2023-10-19 | Discharge: 2023-10-19 | Disposition: A | Discharge status: Home / Self Care | Source: Ambulatory Visit | Attending: Family Medicine | Admitting: Family Medicine

## 2023-10-19 ENCOUNTER — Other Ambulatory Visit: Payer: Self-pay | Admitting: Family Medicine

## 2023-10-19 DIAGNOSIS — N6310 Unspecified lump in the right breast, unspecified quadrant: Secondary | ICD-10-CM

## 2023-10-19 DIAGNOSIS — R599 Enlarged lymph nodes, unspecified: Secondary | ICD-10-CM

## 2023-10-21 ENCOUNTER — Ambulatory Visit
Admission: RE | Admit: 2023-10-21 | Discharge: 2023-10-21 | Disposition: A | Discharge status: Home / Self Care | Source: Ambulatory Visit | Attending: Family Medicine | Admitting: Family Medicine

## 2023-10-21 DIAGNOSIS — N6310 Unspecified lump in the right breast, unspecified quadrant: Secondary | ICD-10-CM

## 2023-10-21 DIAGNOSIS — R599 Enlarged lymph nodes, unspecified: Secondary | ICD-10-CM

## 2023-10-21 HISTORY — PX: BREAST BIOPSY: SHX20

## 2023-10-22 ENCOUNTER — Other Ambulatory Visit

## 2023-10-22 LAB — SURGICAL PATHOLOGY

## 2023-10-23 ENCOUNTER — Telehealth: Payer: Self-pay | Admitting: *Deleted

## 2023-10-23 NOTE — Telephone Encounter (Signed)
**Note De-Identified Panik Obfuscation** Spoke to patient to confirm upcoming morning [facility] clinic appointment on 5/14, paperwork will be sent Roderick email.  Gave location and time, also informed patient that the surgeon's office would be calling as well to get information from them similar to the packet that they will be receiving so make sure to do both.  Reminded patient that all providers will be coming to the clinic to see them HERE and if they had any questions to not hesitate to reach back out to myself or their navigators.

## 2023-10-26 ENCOUNTER — Encounter: Payer: Self-pay | Admitting: *Deleted

## 2023-10-26 ENCOUNTER — Encounter: Payer: Self-pay | Admitting: Genetic Counselor

## 2023-10-26 DIAGNOSIS — Z170 Estrogen receptor positive status [ER+]: Secondary | ICD-10-CM | POA: Insufficient documentation

## 2023-10-27 NOTE — Progress Notes (Signed)
**Note De-Identified Weissmann Obfuscation** Radiation Oncology         ([phone]) [phone] ________________________________  Initial Outpatient Consultation  Name: Felicia Schultz MRN: [id number]  Date: 10/28/2023  DOB: April 07, 1951  OZ:[id number], Jinny Mounts, MD  Lockie Rima, MD   REFERRING PHYSICIAN: Lockie Rima, MD  DIAGNOSIS: No diagnosis found.   Cancer Staging  No matching staging information was found for the patient.   Stage *** Right Breast UOQ Invasive ductal carcinoma w/ right axillary LN involvement, ER+ / PR+ / Her2-, Grade 2  CHIEF COMPLAINT: Here to discuss management of right breast cancer  HISTORY OF PRESENT ILLNESS::Felicia Schultz is a 73 y.o. female who presented with a palpable right breast lump earlier this month. (She also has a prior history of a benign right breast biopsy in 2017 that showed fibrocystic changes. She then underwent excision of the area shortly after with pathology revealing a ductal papilloma with fibrocystic changes and pseudoangiomatous stromal hyperplasia).   She accordingly presented for a bilateral diagnostic mammogram and right breast ultrasound on 10/19/23 which demonstrated: a 2.4 cm mass in the 12 o'clock right breast, located 2 cmfn, corresponding to the palpable site of concern. A linearly arranged area of abnormal tissue/masses extending 3 cm posterosuperior to the 12 o'clock mass was also demonstrated, with the peripheral aspect of the area representing a discrete 1.3 cm mass corresponding to a bilobed mass seen mammographically in the 12 o'clock area located 6 cm from the nipple. A single abnormal right axillary lymph node measuring 2.2 cm in the greatest extent was also demonstrated. No evidence of malignancy was demonstrated in the left breast.   Biopsies collected on 10/21/23 are detailed as follows:   -- Biopsy of the 12 o'clock right breast mass located 2 cmfn showed no evidence of malignancy and findings consistent with fragments of intraductal papilloma w/ apocrine metaplasia and usual  duct hyperplasia.  -- Biopsy of the 12 o'clock right breast located 6 cmfn (including the 1.3 cm mass like area) showed grade 2 invasive ductal carcinoma measuring 4.2 mm in the greatest linear extent of the sample. ER status: 100% positive and PR status 95% positive, both with strong staining intensity; Proliferation marker Ki67 at 5%; Her2 status negative; Grade 2.  -- Biopsy of the single abnormal right axillary lymph node showed metastatic ductal carcinoma with apocrine features and nodal tissue present in the sample.    ***  PREVIOUS RADIATION THERAPY: No  PAST MEDICAL HISTORY:  has a past medical history of Allergy , Anemia, Atrial flutter (HCC), Basal cell carcinoma, GERD (gastroesophageal reflux disease), GI bleed, H/O cardiac radiofrequency ablation (2015), H/O gastric bypass (2005), History of diabetes mellitus (11/13/2015), Hypertension, Hypokalemia, Osteoporosis (2000), Paroxysmal atrial fibrillation (HCC), and Personal history of colonic polyps - sessile serrated (11/23/2013).    PAST SURGICAL HISTORY: Past Surgical History:  Procedure Laterality Date   APPENDECTOMY     ATRIAL FLUTTER ABLATION N/A 09/05/2014   Procedure: ATRIAL FLUTTER ABLATION;  Surgeon: Jolly Needle, MD;  Location: [facility] CATH LAB;  Service: Cardiovascular;  Laterality: N/A;   BIOPSY  06/23/2023   Procedure: BIOPSY;  Surgeon: Normie Becton., MD;  Location: [facility] ENDOSCOPY;  Service: Gastroenterology;;   BREAST BIOPSY Right 10/21/2023   US  RT BREAST BX W LOC DEV 1ST LESION IMG BX SPEC US  GUIDE 10/21/2023 [facility]MOGRAP[facility]   BREAST BIOPSY Right 10/21/2023   US  RT BREAST BX W LOC DEV EA ADD LESION IMG BX SPEC US  GUIDE 10/21/2023 [facility] MAMMOGRAP[facility]   BREAST EXCISIONAL BIOPSY Right 01/2016 **Note De-Identified Paez Obfuscation** CARPAL TUNNEL RELEASE Bilateral    CHOLECYSTECTOMY     ENTEROSCOPY N/A 06/23/2023   Procedure: ENTEROSCOPY;  Surgeon: Brice Campi Albino Alu., MD;  Location: [facility]OS[facility];  Service: Gastroenterology;  Laterality: N/A;   FOOT SURGERY  Bilateral 2012   shorten bones in both feet   GASTRIC BYPASS     removal of ovary Right 1987   SPINE SURGERY  2000/2012   anterior cervical fusion with plating   SUBMUCOSAL TATTOO INJECTION  06/23/2023   Procedure: SUBMUCOSAL TATTOO INJECTION;  Surgeon: Normie Becton., MD;  Location: [facility];  Service: Gastroenterology;;   TOTAL ABDOMINAL HYSTERECTOMY  1999   WRIST SURGERY Right 2018   Dr. Primus Brookes -ganglion excision    FAMILY HISTORY: family history includes Alcohol abuse in her father; Arthritis in her mother; Cancer in her brother and maternal grandmother; Colon cancer in her maternal grandfather; Diabetes in her brother, mother, sister, and sister; Early death in her sister; Emphysema in her maternal grandmother and sister; Esophageal cancer in her maternal grandmother; Hearing loss in her mother; Heart disease in her brother, father, and mother; Hyperlipidemia in her father, mother, and sister; Hypertension in her father, mother, and sister; Kidney disease in her mother; Learning disabilities in her mother; Lung cancer in her brother; Mental illness in her sister; Miscarriages / Stillbirths in her mother, sister, and sister; Scleroderma in her sister; Stroke in her brother and sister.  SOCIAL HISTORY:  reports that she has never smoked. She has never used smokeless tobacco. She reports that she does not drink alcohol and does not use drugs.  ALLERGIES: Amoxicillin -pot clavulanate, Clarithromycin, Nitrofurantoin, Sulfonamide derivatives, Azithromycin , and Sulfa antibiotics  MEDICATIONS:  Current Outpatient Medications  Medication Sig Dispense Refill   acetaminophen  (TYLENOL ) 500 MG tablet Take 1,000 mg by mouth daily as needed (pain.).     citalopram  (CELEXA ) 40 MG tablet Take 1 tablet (40 mg total) by mouth daily. (Patient taking differently: Take 40 mg by mouth every evening.) 90 tablet 3   Cyanocobalamin  (VITAMIN B 12 PO) Take 1 tablet by mouth every evening.      cyclobenzaprine  (FLEXERIL ) 10 MG tablet Take 10 mg by mouth 3 (three) times daily as needed for muscle spasms.     estradiol  (ESTRACE ) 0.1 MG/GM vaginal cream Place 1 Applicatorful vaginally at bedtime. (Patient taking differently: Place 1 Applicatorful vaginally at bedtime as needed (irriation/dryness).) 42.5 g 11   estradiol  (ESTRACE ) 0.5 MG tablet Take 1 tablet (0.5 mg total) by mouth daily. (Patient taking differently: Take 0.5 mg by mouth every evening.) 90 tablet 3   ferrous sulfate 325 (65 FE) MG tablet Take 325 mg by mouth 3 (three) times a week.     gabapentin  (NEURONTIN ) 100 MG capsule Take 100 mg by mouth daily as needed (pain).     gabapentin  (NEURONTIN ) 300 MG capsule Take 300 mg by mouth daily as needed (pain).     Multiple Vitamins-Minerals (MULTIVITAMIN GUMMIES ADULT PO) Take 1 tablet by mouth every evening.     omeprazole  (PRILOSEC) 40 MG capsule Take 1 capsule (40 mg total) by mouth daily before breakfast. 30 mins before breakfast (Patient taking differently: Take 40 mg by mouth at bedtime. 30 mins before breakfast) 90 capsule 3   propranolol  ER (INDERAL  LA) 60 MG 24 hr capsule Take 1 capsule (60 mg total) by mouth daily. (Patient taking differently: Take 60 mg by mouth every evening.) 90 capsule 3   Vitamin D , Ergocalciferol , (DRISDOL ) 1.25 MG (50000 UNIT) CAPS capsule Take 50000IU once **Note De-Identified Huckeba Obfuscation** weekly. (Patient taking differently: Take 50,000 Units by mouth every Monday.) 12 capsule 3   No current facility-administered medications for this encounter.    REVIEW OF SYSTEMS: As above in HPI.   PHYSICAL EXAM:  vitals were not taken for this visit.   General: Alert and oriented, in no acute distress HEENT: Head is normocephalic. Extraocular movements are intact.  Heart: Regular in rate and rhythm with no murmurs, rubs, or gallops. Chest: Clear to auscultation bilaterally, with no rhonchi, wheezes, or rales. Abdomen: Soft, nontender, nondistended, with no rigidity or guarding. Extremities:  No cyanosis or edema. Skin: No concerning lesions. Musculoskeletal: symmetric strength and muscle tone throughout. Neurologic: Cranial nerves II through XII are grossly intact. No obvious focalities. Speech is fluent. Coordination is intact. Psychiatric: Judgment and insight are intact. Affect is appropriate. Breasts: *** . No other palpable masses appreciated in the breasts or axillae *** .    ECOG = ***  0 - Asymptomatic (Fully active, able to carry on all predisease activities without restriction)  1 - Symptomatic but completely ambulatory (Restricted in physically strenuous activity but ambulatory and able to carry out work of a light or sedentary nature. For example, light housework, office work)  2 - Symptomatic, <50% in bed during the day (Ambulatory and capable of all self care but unable to carry out any work activities. Up and about more than 50% of waking hours)  3 - Symptomatic, >50% in bed, but not bedbound (Capable of only limited self-care, confined to bed or chair 50% or more of waking hours)  4 - Bedbound (Completely disabled. Cannot carry on any self-care. Totally confined to bed or chair)  5 - Death   Aurea Blossom MM, Creech RH, Tormey DC, et al. [phone]). "Toxicity and response criteria of the [facility]". Am. Hillard Lowes. Oncol. 5 (6): 649-55   LABORATORY DATA:   CBC    Component Value Date/Time   WBC 5.9 08/20/2023 1007   RBC 4.14 08/20/2023 1007   HGB 12.1 08/20/2023 1007   HGB 12.9 06/18/2017 0917   HCT 37.3 08/20/2023 1007   HCT 39.7 06/18/2017 0917   PLT 247.0 08/20/2023 1007   PLT 221 06/18/2017 0917   MCV 90.0 08/20/2023 1007   MCV 94 06/18/2017 0917   MCH 32.5 06/24/2023 0603   MCHC 32.4 08/20/2023 1007   RDW 15.8 (H) 08/20/2023 1007   RDW 13.6 06/18/2017 0917   LYMPHSABS 1.6 08/20/2023 1007   LYMPHSABS 1.6 06/18/2017 0917   MONOABS 0.4 08/20/2023 1007   EOSABS 0.1 08/20/2023 1007   EOSABS 0.1 06/18/2017 0917   BASOSABS 0.0  08/20/2023 1007   BASOSABS 0.0 06/18/2017 0917    CMP     Component Value Date/Time   NA 140 08/20/2023 1007   NA 144 06/18/2017 0917   K 3.7 08/20/2023 1007   CL 107 08/20/2023 1007   CO2 26 08/20/2023 1007   GLUCOSE 114 (H) 08/20/2023 1007   BUN 22 08/20/2023 1007   BUN 16 06/18/2017 0917   CREATININE 0.64 08/20/2023 1007   CALCIUM  9.3 08/20/2023 1007   CALCIUM  9.1 01/28/2012 1142   PROT 6.5 08/20/2023 1007   PROT 6.4 06/18/2017 0917   ALBUMIN 4.1 08/20/2023 1007   ALBUMIN 4.1 06/18/2017 0917   AST 15 08/20/2023 1007   ALT 14 08/20/2023 1007   ALKPHOS 112 08/20/2023 1007   BILITOT 0.5 08/20/2023 1007   BILITOT 0.5 06/18/2017 0917   GFR 89.56 08/20/2023 1007   GFRNONAA >60 **Note De-Identified Briones Obfuscation** 06/22/2023 1340      RADIOGRAPHY: US  RT BREAST BX W LOC DEV 1ST LESION IMG BX SPEC US  GUIDE Addendum Date: 10/22/2023 ADDENDUM REPORT: 10/22/2023 13:01 ADDENDUM: Pathology revealed FRAGMENTS OF AN INTRADUCTAL PAPILLOMA SHOWING APOCRINE METAPLASIA AND USUAL DUCT HYPERPLASIA, FOCAL CHANGES CONSISTENT WITH PRIOR DUCT RUPTURE, RARE MICROCALCIFICATION PRESENT WITHIN PAPILLOMA/UDH of the RIGHT breast, 12:00 o'clock, 2cmfn, (coil clip). This was found to be concordant by Dr. Dru Georges. Pathology revealed GRADE II INVASIVE MODERATELY DIFFERENTIATED DUCTAL ADENOCARCINOMA WITH APOCRINE FEATURES, ADJACENT FRAGMENT OF CYST WALL of the RIGHT breast, 12:00 o'clock, 6cmfn, (heart clip). This was found to be concordant by Dr. Dru Georges. Pathology revealed METASTATIC DUCTAL ADENOCARCINOMA WITH APOCRINE FEATURES, NODAL TISSUE PRESENT of the RIGHT axilla, (hydromark clip). This was found to be concordant by Dr. Dru Georges. Pathology results were discussed with the patient by telephone by Dr. Dru Georges. The patient reported doing well after the biopsies . Post biopsy instructions and care were reviewed and questions were answered. The patient was encouraged to call [facility] for any  additional concerns. The patient was referred to [facility] on Oct 28, 2023. Pathology results reported by Kraig Peru, RN on 10/22/2023. Electronically Signed   By: Dru Georges M.D.   On: 10/22/2023 13:01   Result Date: 10/22/2023 CLINICAL DATA:  73 year old female with history of prior papilloma excision in the right breast presents for biopsy of a part cystic part solid mass in the right breast 12 o'clock 2 cm from the nipple, a solid mass in the right breast 12 o'clock 6 cm from the nipple and abnormal right axillary node. EXAM: ULTRASOUND GUIDED right BREAST CORE NEEDLE BIOPSY x2 and ultrasound-guided right axillary node core needle biopsy COMPARISON:  Previous exam(s). PROCEDURE: I met with the patient and we discussed the procedure of ultrasound-guided biopsy, including benefits and alternatives. We discussed the high likelihood of a successful procedure. We discussed the risks of the procedure, including infection, bleeding, tissue injury, clip migration, and inadequate sampling. Informed written consent was given. The usual time-out protocol was performed immediately prior to the procedure. Site #1: Part cystic part solid 2.4 x 1.5 x 1.6 cm mass at 12 o'clock 2 cm from the nipple. Approximately 3-4 cc of blood was aspirated from the cystic component and sent for cytology. The solid nodule was targeted for biopsy. Using sterile technique and 1% Lidocaine  as local anesthetic, under direct ultrasound visualization, a 14 gauge spring-loaded device was used to perform biopsy of the solid component of a part cystic part solid mass in the right breast at 12 o'clock 2 cm from nipple using a craniocaudal approach. At the conclusion of the procedure coil shaped tissue marker clip was deployed into the biopsy cavity. Follow up 2 view mammogram was performed and dictated separately. Site 2: Using sterile technique and 1% lidocaine  as local  anesthetic under direct ultrasound visualization a new 14 gauge spring-loaded device was used for biopsy of a solid 1.3 x 0.6 cm mass in the right breast at 12 o'clock 6 cm from the nipple using a craniocaudal approach. At the conclusion of the procedure a heart-shaped biopsy marker clip was deployed into the mass. Site 3. Using sterile technique and 1% lidocaine  as local anesthetic under direct ultrasound visualization an abnormally enlarged right axillary lymph node was localized for biopsy. A 14 gauge spring-loaded device was used for biopsy of this lymph node in the right **Note De-Identified Schiff Obfuscation** axilla using a craniocaudal approach. At the conclusion the procedure [facility] clip was placed into the lymph node. Follow-up two-view mammogram was performed and dictated separately. IMPRESSION: Ultrasound-guided biopsy of: 1. Right breast 2.4 cm part cystic part solid mass at 12 o'clock 2 cm from the nipple with coil clip placement. 2. Solid 1.3 cm right breast mass at 12 o'clock 6 cm from the nipple with heart clip placement. 3. Abnormal right axillary node with HydroMARK placement. No apparent postprocedure complications. Electronically Signed: By: Dru Georges M.D. On: 10/21/2023 14:24   US  RT BREAST BX W LOC DEV EA ADD LESION IMG BX SPEC US  GUIDE Addendum Date: 10/22/2023 ADDENDUM REPORT: 10/22/2023 13:01 ADDENDUM: Pathology revealed FRAGMENTS OF AN INTRADUCTAL PAPILLOMA SHOWING APOCRINE METAPLASIA AND USUAL DUCT HYPERPLASIA, FOCAL CHANGES CONSISTENT WITH PRIOR DUCT RUPTURE, RARE MICROCALCIFICATION PRESENT WITHIN PAPILLOMA/UDH of the RIGHT breast, 12:00 o'clock, 2cmfn, (coil clip). This was found to be concordant by Dr. Dru Georges. Pathology revealed GRADE II INVASIVE MODERATELY DIFFERENTIATED DUCTAL ADENOCARCINOMA WITH APOCRINE FEATURES, ADJACENT FRAGMENT OF CYST WALL of the RIGHT breast, 12:00 o'clock, 6cmfn, (heart clip). This was found to be concordant by Dr. Dru Georges. Pathology revealed METASTATIC DUCTAL  ADENOCARCINOMA WITH APOCRINE FEATURES, NODAL TISSUE PRESENT of the RIGHT axilla, (hydromark clip). This was found to be concordant by Dr. Dru Georges. Pathology results were discussed with the patient by telephone by Dr. Dru Georges. The patient reported doing well after the biopsies . Post biopsy instructions and care were reviewed and questions were answered. The patient was encouraged to call [facility] for any additional concerns. The patient was referred to [facility] on Oct 28, 2023. Pathology results reported by Kraig Peru, RN on 10/22/2023. Electronically Signed   By: Dru Georges M.D.   On: 10/22/2023 13:01   Result Date: 10/22/2023 CLINICAL DATA:  73 year old female with history of prior papilloma excision in the right breast presents for biopsy of a part cystic part solid mass in the right breast 12 o'clock 2 cm from the nipple, a solid mass in the right breast 12 o'clock 6 cm from the nipple and abnormal right axillary node. EXAM: ULTRASOUND GUIDED right BREAST CORE NEEDLE BIOPSY x2 and ultrasound-guided right axillary node core needle biopsy COMPARISON:  Previous exam(s). PROCEDURE: I met with the patient and we discussed the procedure of ultrasound-guided biopsy, including benefits and alternatives. We discussed the high likelihood of a successful procedure. We discussed the risks of the procedure, including infection, bleeding, tissue injury, clip migration, and inadequate sampling. Informed written consent was given. The usual time-out protocol was performed immediately prior to the procedure. Site #1: Part cystic part solid 2.4 x 1.5 x 1.6 cm mass at 12 o'clock 2 cm from the nipple. Approximately 3-4 cc of blood was aspirated from the cystic component and sent for cytology. The solid nodule was targeted for biopsy. Using sterile technique and 1% Lidocaine  as local anesthetic, under  direct ultrasound visualization, a 14 gauge spring-loaded device was used to perform biopsy of the solid component of a part cystic part solid mass in the right breast at 12 o'clock 2 cm from nipple using a craniocaudal approach. At the conclusion of the procedure coil shaped tissue marker clip was deployed into the biopsy cavity. Follow up 2 view mammogram was performed and dictated separately. Site 2: Using sterile technique and 1% lidocaine  as local anesthetic under direct ultrasound visualization a new 14 gauge **Note De-Identified Oliger Obfuscation** spring-loaded device was used for biopsy of a solid 1.3 x 0.6 cm mass in the right breast at 12 o'clock 6 cm from the nipple using a craniocaudal approach. At the conclusion of the procedure a heart-shaped biopsy marker clip was deployed into the mass. Site 3. Using sterile technique and 1% lidocaine  as local anesthetic under direct ultrasound visualization an abnormally enlarged right axillary lymph node was localized for biopsy. A 14 gauge spring-loaded device was used for biopsy of this lymph node in the right axilla using a craniocaudal approach. At the conclusion the procedure [employer] clip was placed into the lymph node. Follow-up two-view mammogram was performed and dictated separately. IMPRESSION: Ultrasound-guided biopsy of: 1. Right breast 2.4 cm part cystic part solid mass at 12 o'clock 2 cm from the nipple with coil clip placement. 2. Solid 1.3 cm right breast mass at 12 o'clock 6 cm from the nipple with heart clip placement. 3. Abnormal right axillary node with HydroMARK placement. No apparent postprocedure complications. Electronically Signed: By: Dru Georges M.D. On: 10/21/2023 14:24   US  AXILLARY NODE CORE BIOPSY RIGHT Addendum Date: 10/22/2023 ADDENDUM REPORT: 10/22/2023 13:01 ADDENDUM: Pathology revealed FRAGMENTS OF AN INTRADUCTAL PAPILLOMA SHOWING APOCRINE METAPLASIA AND USUAL DUCT HYPERPLASIA, FOCAL CHANGES CONSISTENT WITH PRIOR DUCT RUPTURE, RARE MICROCALCIFICATION PRESENT  WITHIN PAPILLOMA/UDH of the RIGHT breast, 12:00 o'clock, 2cmfn, (coil clip). This was found to be concordant by Dr. Dru Georges. Pathology revealed GRADE II INVASIVE MODERATELY DIFFERENTIATED DUCTAL ADENOCARCINOMA WITH APOCRINE FEATURES, ADJACENT FRAGMENT OF CYST WALL of the RIGHT breast, 12:00 o'clock, 6cmfn, (heart clip). This was found to be concordant by Dr. Dru Georges. Pathology revealed METASTATIC DUCTAL ADENOCARCINOMA WITH APOCRINE FEATURES, NODAL TISSUE PRESENT of the RIGHT axilla, (hydromark clip). This was found to be concordant by Dr. Dru Georges. Pathology results were discussed with the patient by telephone by Dr. Dru Georges. The patient reported doing well after the biopsies . Post biopsy instructions and care were reviewed and questions were answered. The patient was encouraged to call [facility] for any additional concerns. The patient was referred to [facility] on Oct 28, 2023. Pathology results reported by Kraig Peru, RN on 10/22/2023. Electronically Signed   By: Dru Georges M.D.   On: 10/22/2023 13:01   Result Date: 10/22/2023 CLINICAL DATA:  73 year old female with history of prior papilloma excision in the right breast presents for biopsy of a part cystic part solid mass in the right breast 12 o'clock 2 cm from the nipple, a solid mass in the right breast 12 o'clock 6 cm from the nipple and abnormal right axillary node. EXAM: ULTRASOUND GUIDED right BREAST CORE NEEDLE BIOPSY x2 and ultrasound-guided right axillary node core needle biopsy COMPARISON:  Previous exam(s). PROCEDURE: I met with the patient and we discussed the procedure of ultrasound-guided biopsy, including benefits and alternatives. We discussed the high likelihood of a successful procedure. We discussed the risks of the procedure, including infection, bleeding, tissue injury, clip migration, and inadequate  sampling. Informed written consent was given. The usual time-out protocol was performed immediately prior to the procedure. Site #1: Part cystic part solid 2.4 x 1.5 x 1.6 cm mass at 12 o'clock 2 cm from the nipple. Approximately 3-4 cc of blood was aspirated from the cystic component and sent for cytology. The solid nodule was targeted for biopsy. Using sterile technique and 1% Lidocaine  as local anesthetic, under direct ultrasound visualization, a 14 gauge **Note De-Identified Carriero Obfuscation** spring-loaded device was used to perform biopsy of the solid component of a part cystic part solid mass in the right breast at 12 o'clock 2 cm from nipple using a craniocaudal approach. At the conclusion of the procedure coil shaped tissue marker clip was deployed into the biopsy cavity. Follow up 2 view mammogram was performed and dictated separately. Site 2: Using sterile technique and 1% lidocaine  as local anesthetic under direct ultrasound visualization a new 14 gauge spring-loaded device was used for biopsy of a solid 1.3 x 0.6 cm mass in the right breast at 12 o'clock 6 cm from the nipple using a craniocaudal approach. At the conclusion of the procedure a heart-shaped biopsy marker clip was deployed into the mass. Site 3. Using sterile technique and 1% lidocaine  as local anesthetic under direct ultrasound visualization an abnormally enlarged right axillary lymph node was localized for biopsy. A 14 gauge spring-loaded device was used for biopsy of this lymph node in the right axilla using a craniocaudal approach. At the conclusion the procedure [facility] clip was placed into the lymph node. Follow-up two-view mammogram was performed and dictated separately. IMPRESSION: Ultrasound-guided biopsy of: 1. Right breast 2.4 cm part cystic part solid mass at 12 o'clock 2 cm from the nipple with coil clip placement. 2. Solid 1.3 cm right breast mass at 12 o'clock 6 cm from the nipple with heart clip placement. 3. Abnormal right axillary node with HydroMARK  placement. No apparent postprocedure complications. Electronically Signed: By: Dru Georges M.D. On: 10/21/2023 14:24   MM CLIP PLACEMENT RIGHT Result Date: 10/21/2023 CLINICAL DATA:  73 year old female status post biopsy of 2 right breast masses at 12 o'clock 2 and 6 cm from the nipple as well as a abnormal right axillary lymph node. EXAM: 3D DIAGNOSTIC the right MAMMOGRAM POST ULTRASOUND BIOPSY COMPARISON:  Previous exam(s). ACR Breast Density Category b: There are scattered areas of fibroglandular density. FINDINGS: 3D Mammographic images were obtained following ultrasound guided biopsy of 2 masses in the right breast at 12 o'clock 2 in 6 cm from the nipple as well as an abnormal right axillary lymph node. The biopsy marking clip s are in expected position at the site of biopsy. IMPRESSION: Appropriate positioning of the coil coil clip denoting part cystic mass at 12 o'clock 2 cm from the nipple, the heart shaped clip denoting the solid mass at 12 o'clock 6 cm from the nipple, and the HydroMARK clip denoting abnormal lymph node in the right axilla. Final Assessment: Post Procedure Mammograms for Marker Placement Electronically Signed   By: Dru Georges M.D.   On: 10/21/2023 14:28   MM 3D DIAGNOSTIC MAMMOGRAM BILATERAL BREAST Result Date: 10/19/2023 CLINICAL DATA:  Patient presents with a palpable right breast lump. She has a history of a benign right breast biopsy in 2017 revealing fibrocystic change. She subsequently underwent a right breast excision in 2017 which revealed a ductal papilloma, fibrocystic change and pseudoangiomatous stromal hyperplasia. EXAM: DIGITAL DIAGNOSTIC BILATERAL MAMMOGRAM WITH TOMOSYNTHESIS AND CAD; ULTRASOUND RIGHT BREAST LIMITED TECHNIQUE: Bilateral digital diagnostic mammography and breast tomosynthesis was performed. The images were evaluated with computer-aided detection. ; Targeted ultrasound examination of the right breast was performed COMPARISON:  Previous exam(s).  ACR Breast Density Category b: There are scattered areas of fibroglandular density. FINDINGS: In the right breast, there is a circumscribed mass in the upper outer breast, along the posterior aspect of the surgical site, measuring 2.2 cm in size, corresponding to the palpable lump. Posterior and superior to this is **Note De-Identified [place] Obfuscation** a bilobed mass versus 2 contiguous masses, combined maximum length 1.1 cm. Postsurgical architectural distortion with associated surgical vascular clips are centered along the anterior margin of the right breast mass in the upper outer quadrant at 11-12 o'clock. In the right axilla, there is a single enlarged/thickened lymph node. In the left breast, there are no masses, areas of significant asymmetry, areas of architectural distortion or suspicious calcifications. No mammographic change on the left. On physical exam, there is a firm palpable mass in the upper outer right breast near 12 o'clock. Targeted ultrasound is performed, showing a cystic and solid mass at 12 o'clock, 2 cm the nipple, measuring 2.4 x 1.6 x 1.7 cm in size. This corresponds to the palpable mass. The solid component lies along 1 wall, measuring 1.2 x 0.9 x 0.9 cm. Color Doppler blood flow is seen along the periphery of the solid component. Superior to the palpable mass is a hypoechoic, oval mass with partly ill-defined and a single angular margin measuring 1.3 x 0.6 x 0.6 cm. This mass shows internal blood flow on color Doppler analysis and lies at 12 o'clock, 6 cm from the nipple. Linear hypoechoic tissue/small masses with an apparent associated mildly ectatic duct extends between the cystic and solid mass and the 1.3 cm mass, lesions separated by 3 cm. Sonographic imaging of the right axilla demonstrates a single abnormal lymph node measuring 2.2 x 1.6 x 2.0 cm. There is no significant hilar fat. Increased blood flow is seen on color Doppler analysis. IMPRESSION: 1. Findings highly suspicious for right breast malignancy. Cystic and  solid mass in the right breast at 12 o'clock, 2 cm the nipple corresponds to the palpable lump. There is a linearly arranged area of abnormal tissue/masses extending for 3 cm posterosuperior to the mass, the more peripheral of which represents a discrete 1.3 cm mass corresponding to the bilobed mass seen mammographically, at 12 o'clock, 6 cm from the nipple. Single abnormal right axillary lymph node. 2. No evidence of left breast malignancy. RECOMMENDATION: 1. Ultrasound-guided core needle biopsies of the right breast x2 and right axilla x1. Recommend ultrasound-guided core needle biopsy of the solid component of the cystic and solid mass as well as of the 1.3 cm mass at 12 o'clock, 6 cm the nipple. Ultrasound-guided core needle biopsy of the single abnormal right axillary lymph node. I have discussed the findings and recommendations with the patient. If applicable, a reminder letter will be sent to the patient regarding the next appointment. BI-RADS CATEGORY  5: Highly suggestive of malignancy. Electronically Signed   By: Amanda Jungling M.D.   On: 10/19/2023 14:35   US  LIMITED ULTRASOUND INCLUDING AXILLA RIGHT BREAST Result Date: 10/19/2023 CLINICAL DATA:  Patient presents with a palpable right breast lump. She has a history of a benign right breast biopsy in 2017 revealing fibrocystic change. She subsequently underwent a right breast excision in 2017 which revealed a ductal papilloma, fibrocystic change and pseudoangiomatous stromal hyperplasia. EXAM: DIGITAL DIAGNOSTIC BILATERAL MAMMOGRAM WITH TOMOSYNTHESIS AND CAD; ULTRASOUND RIGHT BREAST LIMITED TECHNIQUE: Bilateral digital diagnostic mammography and breast tomosynthesis was performed. The images were evaluated with computer-aided detection. ; Targeted ultrasound examination of the right breast was performed COMPARISON:  Previous exam(s). ACR Breast Density Category b: There are scattered areas of fibroglandular density. FINDINGS: In the right breast, there is  a circumscribed mass in the upper outer breast, along the posterior aspect of the surgical site, measuring 2.2 cm in size, corresponding to the palpable lump. Posterior and superior **Note De-Identified [place] Obfuscation** to this is a bilobed mass versus 2 contiguous masses, combined maximum length 1.1 cm. Postsurgical architectural distortion with associated surgical vascular clips are centered along the anterior margin of the right breast mass in the upper outer quadrant at 11-12 o'clock. In the right axilla, there is a single enlarged/thickened lymph node. In the left breast, there are no masses, areas of significant asymmetry, areas of architectural distortion or suspicious calcifications. No mammographic change on the left. On physical exam, there is a firm palpable mass in the upper outer right breast near 12 o'clock. Targeted ultrasound is performed, showing a cystic and solid mass at 12 o'clock, 2 cm the nipple, measuring 2.4 x 1.6 x 1.7 cm in size. This corresponds to the palpable mass. The solid component lies along 1 wall, measuring 1.2 x 0.9 x 0.9 cm. Color Doppler blood flow is seen along the periphery of the solid component. Superior to the palpable mass is a hypoechoic, oval mass with partly ill-defined and a single angular margin measuring 1.3 x 0.6 x 0.6 cm. This mass shows internal blood flow on color Doppler analysis and lies at 12 o'clock, 6 cm from the nipple. Linear hypoechoic tissue/small masses with an apparent associated mildly ectatic duct extends between the cystic and solid mass and the 1.3 cm mass, lesions separated by 3 cm. Sonographic imaging of the right axilla demonstrates a single abnormal lymph node measuring 2.2 x 1.6 x 2.0 cm. There is no significant hilar fat. Increased blood flow is seen on color Doppler analysis. IMPRESSION: 1. Findings highly suspicious for right breast malignancy. Cystic and solid mass in the right breast at 12 o'clock, 2 cm the nipple corresponds to the palpable lump. There is a linearly arranged  area of abnormal tissue/masses extending for 3 cm posterosuperior to the mass, the more peripheral of which represents a discrete 1.3 cm mass corresponding to the bilobed mass seen mammographically, at 12 o'clock, 6 cm from the nipple. Single abnormal right axillary lymph node. 2. No evidence of left breast malignancy. RECOMMENDATION: 1. Ultrasound-guided core needle biopsies of the right breast x2 and right axilla x1. Recommend ultrasound-guided core needle biopsy of the solid component of the cystic and solid mass as well as of the 1.3 cm mass at 12 o'clock, 6 cm the nipple. Ultrasound-guided core needle biopsy of the single abnormal right axillary lymph node. I have discussed the findings and recommendations with the patient. If applicable, a reminder letter will be sent to the patient regarding the next appointment. BI-RADS CATEGORY  5: Highly suggestive of malignancy. Electronically Signed   By: Amanda Jungling M.D.   On: 10/19/2023 14:35      IMPRESSION/PLAN: ***   It was a pleasure meeting the patient today. We discussed the risks, benefits, and side effects of radiotherapy. I recommend radiotherapy to the *** to reduce her risk of locoregional recurrence by 2/3.  We discussed that radiation would take approximately *** weeks to complete and that I would give the patient a few weeks to heal following surgery before starting treatment planning. *** If chemotherapy were to be given, this would precede radiotherapy. We spoke about acute effects including skin irritation and fatigue as well as much less common late effects including internal organ injury or irritation. We spoke about the latest technology that is used to minimize the risk of late effects for patients undergoing radiotherapy to the breast or chest wall. No guarantees of treatment were given. The patient is enthusiastic about proceeding with treatment. I **Note De-Identified [place] Obfuscation** look forward to participating in the patient's care.  I will await her referral back to me  for postoperative follow-up and eventual CT simulation/treatment planning.  On date of service, in total, I spent *** minutes on this encounter. Patient was seen in person.   __________________________________________   Colie Dawes, MD  This document serves as a record of services personally performed by Colie Dawes, MD. It was created on her behalf by Aleta Anda, a trained medical scribe. The creation of this record is based on the scribe's personal observations and the provider's statements to them. This document has been checked and approved by the attending provider.

## 2023-10-28 ENCOUNTER — Inpatient Hospital Stay

## 2023-10-28 ENCOUNTER — Inpatient Hospital Stay (HOSPITAL_BASED_OUTPATIENT_CLINIC_OR_DEPARTMENT_OTHER): Admitting: Hematology and Oncology

## 2023-10-28 ENCOUNTER — Inpatient Hospital Stay: Attending: Licensed Clinical Social Worker | Admitting: Licensed Clinical Social Worker

## 2023-10-28 ENCOUNTER — Other Ambulatory Visit: Payer: Self-pay

## 2023-10-28 ENCOUNTER — Ambulatory Visit: Attending: General Surgery | Admitting: Physical Therapy

## 2023-10-28 ENCOUNTER — Other Ambulatory Visit: Payer: Self-pay | Admitting: General Surgery

## 2023-10-28 ENCOUNTER — Telehealth: Payer: Self-pay | Admitting: Genetic Counselor

## 2023-10-28 ENCOUNTER — Encounter: Payer: Self-pay | Admitting: Radiation Oncology

## 2023-10-28 ENCOUNTER — Encounter: Payer: Self-pay | Admitting: *Deleted

## 2023-10-28 ENCOUNTER — Telehealth: Payer: Self-pay | Admitting: *Deleted

## 2023-10-28 ENCOUNTER — Encounter: Payer: Self-pay | Admitting: Physical Therapy

## 2023-10-28 ENCOUNTER — Ambulatory Visit
Admission: RE | Admit: 2023-10-28 | Discharge: 2023-10-28 | Disposition: A | Discharge status: Home / Self Care | Source: Ambulatory Visit | Attending: Radiation Oncology | Admitting: Radiation Oncology

## 2023-10-28 VITALS — BP 141/64 | HR 73 | Temp 97.5°F | Resp 18 | Ht 64.0 in | Wt 186.8 lb

## 2023-10-28 DIAGNOSIS — Z825 Family history of asthma and other chronic lower respiratory diseases: Secondary | ICD-10-CM | POA: Diagnosis not present

## 2023-10-28 DIAGNOSIS — Z83438 Family history of other disorder of lipoprotein metabolism and other lipidemia: Secondary | ICD-10-CM

## 2023-10-28 DIAGNOSIS — C50411 Malignant neoplasm of upper-outer quadrant of right female breast: Secondary | ICD-10-CM

## 2023-10-28 DIAGNOSIS — Z818 Family history of other mental and behavioral disorders: Secondary | ICD-10-CM | POA: Insufficient documentation

## 2023-10-28 DIAGNOSIS — Z9049 Acquired absence of other specified parts of digestive tract: Secondary | ICD-10-CM | POA: Diagnosis not present

## 2023-10-28 DIAGNOSIS — Z1732 Human epidermal growth factor receptor 2 negative status: Secondary | ICD-10-CM | POA: Diagnosis not present

## 2023-10-28 DIAGNOSIS — Z170 Estrogen receptor positive status [ER+]: Secondary | ICD-10-CM

## 2023-10-28 DIAGNOSIS — Z860101 Personal history of adenomatous and serrated colon polyps: Secondary | ICD-10-CM | POA: Insufficient documentation

## 2023-10-28 DIAGNOSIS — Z8249 Family history of ischemic heart disease and other diseases of the circulatory system: Secondary | ICD-10-CM | POA: Diagnosis not present

## 2023-10-28 DIAGNOSIS — Z85828 Personal history of other malignant neoplasm of skin: Secondary | ICD-10-CM | POA: Diagnosis not present

## 2023-10-28 DIAGNOSIS — Z1721 Progesterone receptor positive status: Secondary | ICD-10-CM | POA: Insufficient documentation

## 2023-10-28 DIAGNOSIS — R293 Abnormal posture: Secondary | ICD-10-CM

## 2023-10-28 DIAGNOSIS — Z801 Family history of malignant neoplasm of trachea, bronchus and lung: Secondary | ICD-10-CM

## 2023-10-28 DIAGNOSIS — Z833 Family history of diabetes mellitus: Secondary | ICD-10-CM | POA: Diagnosis not present

## 2023-10-28 DIAGNOSIS — Z79811 Long term (current) use of aromatase inhibitors: Secondary | ICD-10-CM | POA: Insufficient documentation

## 2023-10-28 DIAGNOSIS — Z841 Family history of disorders of kidney and ureter: Secondary | ICD-10-CM | POA: Insufficient documentation

## 2023-10-28 DIAGNOSIS — Z880 Allergy status to penicillin: Secondary | ICD-10-CM | POA: Insufficient documentation

## 2023-10-28 DIAGNOSIS — Z811 Family history of alcohol abuse and dependence: Secondary | ICD-10-CM | POA: Insufficient documentation

## 2023-10-28 DIAGNOSIS — Z90710 Acquired absence of both cervix and uterus: Secondary | ICD-10-CM

## 2023-10-28 DIAGNOSIS — Z882 Allergy status to sulfonamides status: Secondary | ICD-10-CM | POA: Diagnosis not present

## 2023-10-28 DIAGNOSIS — Z800 Family history of malignant neoplasm of digestive organs: Secondary | ICD-10-CM

## 2023-10-28 DIAGNOSIS — Z79899 Other long term (current) drug therapy: Secondary | ICD-10-CM

## 2023-10-28 DIAGNOSIS — Z808 Family history of malignant neoplasm of other organs or systems: Secondary | ICD-10-CM | POA: Diagnosis not present

## 2023-10-28 DIAGNOSIS — Z9884 Bariatric surgery status: Secondary | ICD-10-CM

## 2023-10-28 DIAGNOSIS — Z822 Family history of deafness and hearing loss: Secondary | ICD-10-CM

## 2023-10-28 DIAGNOSIS — Z823 Family history of stroke: Secondary | ICD-10-CM

## 2023-10-28 DIAGNOSIS — Z881 Allergy status to other antibiotic agents status: Secondary | ICD-10-CM

## 2023-10-28 DIAGNOSIS — M542 Cervicalgia: Secondary | ICD-10-CM

## 2023-10-28 DIAGNOSIS — Z8261 Family history of arthritis: Secondary | ICD-10-CM

## 2023-10-28 DIAGNOSIS — D241 Benign neoplasm of right breast: Secondary | ICD-10-CM

## 2023-10-28 LAB — CBC WITH DIFFERENTIAL (CANCER CENTER ONLY)
Abs Immature Granulocytes: 0.02 10*3/uL (ref 0.00–0.07)
Basophils Absolute: 0 10*3/uL (ref 0.0–0.1)
Basophils Relative: 0 %
Eosinophils Absolute: 0.1 10*3/uL (ref 0.0–0.5)
Eosinophils Relative: 1 %
HCT: 38.6 % (ref 36.0–46.0)
Hemoglobin: 12.6 g/dL (ref 12.0–15.0)
Immature Granulocytes: 0 %
Lymphocytes Relative: 29 %
Lymphs Abs: 1.6 10*3/uL (ref 0.7–4.0)
MCH: 29 pg (ref 26.0–34.0)
MCHC: 32.6 g/dL (ref 30.0–36.0)
MCV: 88.7 fL (ref 80.0–100.0)
Monocytes Absolute: 0.4 10*3/uL (ref 0.1–1.0)
Monocytes Relative: 6 %
Neutro Abs: 3.7 10*3/uL (ref 1.7–7.7)
Neutrophils Relative %: 64 %
Platelet Count: 211 10*3/uL (ref 150–400)
RBC: 4.35 MIL/uL (ref 3.87–5.11)
RDW: 15.8 % — ABNORMAL HIGH (ref 11.5–15.5)
WBC Count: 5.8 10*3/uL (ref 4.0–10.5)
nRBC: 0 % (ref 0.0–0.2)

## 2023-10-28 LAB — CMP (CANCER CENTER ONLY)
ALT: 19 U/L (ref 0–44)
AST: 17 U/L (ref 15–41)
Albumin: 3.9 g/dL (ref 3.5–5.0)
Alkaline Phosphatase: 118 U/L (ref 38–126)
Anion gap: 4 — ABNORMAL LOW (ref 5–15)
BUN: 18 mg/dL (ref 8–23)
CO2: 29 mmol/L (ref 22–32)
Calcium: 8.9 mg/dL (ref 8.9–10.3)
Chloride: 108 mmol/L (ref 98–111)
Creatinine: 0.58 mg/dL (ref 0.44–1.00)
GFR, Estimated: >60 mL/min (ref >60)
Glucose, Bld: 101 mg/dL — ABNORMAL HIGH (ref 70–99)
Potassium: 3.6 mmol/L (ref 3.5–5.1)
Sodium: 141 mmol/L (ref 135–145)
Total Bilirubin: 0.6 mg/dL (ref 0.0–1.2)
Total Protein: 6.5 g/dL (ref 6.5–8.1)

## 2023-10-28 MED ORDER — ANASTROZOLE 1 MG PO TABS: 1.0000 mg | ORAL_TABLET | Freq: Every day | ORAL | 0 refills | Status: DC

## 2023-10-28 NOTE — Progress Notes (Signed)
**Note De-Identified [place] Obfuscation** [facility] Clinical Social Work  Initial Assessment   Felicia Schultz is a 73 y.o. year old female accompanied by spouse, [name]. Clinical Social Work was referred by [facility] for assessment of psychosocial needs.   SDOH (Social Determinants of Health) assessments performed: Yes SDOH Interventions    Flowsheet Row Clinical Support from 10/28/2023 in [facility] Of [place]. [facility]  SDOH Interventions   Food Insecurity Interventions Intervention Not Indicated  Housing Interventions Intervention Not Indicated  Transportation Interventions Intervention Not Indicated  Utilities Interventions Intervention Not Indicated  Depression Interventions/Treatment  Counseling       SDOH Screenings   Food Insecurity: No Food Insecurity (10/28/2023)  Housing: Low Risk  (10/28/2023)  Transportation Needs: No Transportation Needs (10/28/2023)  Utilities: Not At Risk (10/28/2023)  Depression (PHQ2-9): High Risk (10/28/2023)  Social Connections: Moderately Integrated (06/24/2023)  Tobacco Use: Low Risk  (10/28/2023)     Distress Screen completed: No     No data to display            Family/Social Information:  Housing Arrangement: patient lives with spouse Family members/support persons in your life? Colleagues and spouse Transportation concerns: no  Employment: working for [employer] in [employer]. Is low on FMLA time due to other health issues, but co-workers are supportive and trying to help her find time off  Income source: Employment Financial concerns: No Type of concern: None Food access concerns: no Advanced directives: Not known Services Currently in place:  [employer]  Coping/ Adjustment to diagnosis: Patient understands treatment plan and what happens next? 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 Since meeting her medical team and learning about the plan, she is feeling a little better, although still nervous Concerns about diagnosis  and/or treatment: Overwhelmed by information; getting time off from work Patient reported stressors: Anxiety/ nervousness and Adjusting to my illness Patient enjoys working in the yard/ with her plants Current coping skills/ strengths: Ability for insight , Capable of independent living , Special hobby/interest , and Supportive family/friends     SUMMARY: Current SDOH Barriers:  Anxiety and depression with adjustment to cancer  Clinical Social Work Clinical Goal(s):  Demonstrate a reduction in symptoms related to :adjustment disorder with anxiety and depression   Interventions: Discussed common feeling and emotions when being diagnosed with cancer, and the importance of support during treatment Informed patient of the support team roles and support services at [facility] Provided CSW contact information and encouraged patient to call with any questions or concerns    Follow Up Plan: CSW will follow-up with patient by phone in 1-2 weeks. Pt is welcome to call as needed prior to that contact Patient verbalizes understanding of plan: Yes    Jael Kostick E Tennile Styles, LCSW Clinical Social Worker [facility]

## 2023-10-28 NOTE — Assessment & Plan Note (Addendum)
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 After reviewing the pathology in detail, we proceeded to discuss the different treatment options between surgery, radiation, chemotherapy, antiestrogen therapies.  Recommendations: 1. Breast conserving surgery with targeted lymph node dissection 2. Oncotype DX testing to determine if chemotherapy would be of any benefit followed by 3. Adjuvant radiation therapy followed by 4. Adjuvant antiestrogen therapy 5.  Staging scans with CT and bone scan will be done  Oncotype counseling: I discussed Oncotype DX test. I explained to the patient that this is a 21 gene panel to evaluate patient tumors DNA to calculate recurrence score. This would help determine whether patient has high risk or low risk breast cancer. She understands that if her tumor was found to be high risk, she would benefit from systemic chemotherapy. If low risk, no need of chemotherapy.  Cervical decompression and fusion surgery: Because of this her breast surgery will be delayed.  Therefore I recommended starting her on anastrozole 1 mg daily.  Return to clinic after surgery to discuss Oncotype DX test result

## 2023-10-28 NOTE — Progress Notes (Signed)
**Note De-Identified [place] Obfuscation** [facility] CONSULT NOTE  Patient Care Team: Rodney Clamp, MD as PCP - General (Family Medicine) Cameron Cea, MD as Consulting Physician (Hematology and Oncology) Auther Bo, RN as Oncology Nurse Navigator Alane Hsu, RN as Oncology Nurse Navigator Lockie Rima, MD as Consulting Physician (General Surgery) Colie Dawes, MD as Attending Physician (Radiation Oncology)  CHIEF COMPLAINTS/PURPOSE OF CONSULTATION:  Newly diagnosed breast cancer  HISTORY OF PRESENTING ILLNESS:  Felicia Schultz is a 72 year old who had palpated a right breast lump.  This was a a same area where she had an intraductal papilloma removed in 2017.  The mammogram and ultrasound revealed a 2.4 cm lesion anteriorly which on biopsy came back as intraductal papilloma the posterior lesion measuring 1.3 cm which on biopsy came back as grade 2 IDC with apocrine features ER 100% PR 95% HER2 negative Ki67 5%, 1 enlarged lymph node biopsy was positive.  She was presented this morning at the multidisciplinary tumor board and she is here today to discuss her treatment plan.  I reviewed her records extensively and collaborated the history with the patient.  SUMMARY OF ONCOLOGIC HISTORY: Oncology History  Malignant neoplasm of upper-outer quadrant of right breast in female, estrogen receptor positive (HCC)  10/21/2023 Initial Diagnosis   Palpable right breast lump: 2 masses: Anterior mass 2.4 cm (intraductal papilloma previously resected 2017), posterior mass 1.3 cm grade 2 IDC with apocrine features ER 100%, PR 95%, HER2 negative, Ki-67 5%, 1 enlarged lymph node: Biopsy positive      MEDICAL HISTORY:  Past Medical History:  Diagnosis Date   Allergy     Anemia    Atrial flutter (HCC)    typical appearing   Basal cell carcinoma     nose - 1985   GERD (gastroesophageal reflux disease)    GI bleed    H/O cardiac radiofrequency ablation 2015   H/O gastric bypass 2005   History of diabetes mellitus  11/13/2015   Hypertension    Hypokalemia    Osteoporosis 2000   Paroxysmal atrial fibrillation (HCC)    Personal history of colonic polyps - sessile serrated 11/23/2013    SURGICAL HISTORY: Past Surgical History:  Procedure Laterality Date   APPENDECTOMY     ATRIAL FLUTTER ABLATION N/A 09/05/2014   Procedure: ATRIAL FLUTTER ABLATION;  Surgeon: Jolly Needle, MD;  Location: [facility];  Service: Cardiovascular;  Laterality: N/A;   BIOPSY  06/23/2023   Procedure: BIOPSY;  Surgeon: Normie Becton., MD;  Location: [facility];  Service: Gastroenterology;;   BREAST BIOPSY Right 10/21/2023   US  RT BREAST BX W LOC DEV 1ST LESION IMG BX SPEC US  GUIDE 10/21/2023 [facility]   BREAST BIOPSY Right 10/21/2023   US  RT BREAST BX W LOC DEV EA ADD LESION IMG BX SPEC US  GUIDE 10/21/2023 [facility]   BREAST EXCISIONAL BIOPSY Right 01/2016   CARPAL TUNNEL RELEASE Bilateral    CHOLECYSTECTOMY     ENTEROSCOPY N/A 06/23/2023   Procedure: ENTEROSCOPY;  Surgeon: Normie Becton., MD;  Location: [facility];  Service: Gastroenterology;  Laterality: N/A;   FOOT SURGERY Bilateral 2012   shorten bones in both feet   GASTRIC BYPASS     removal of ovary Right 1987   SPINE SURGERY  2000/2012   anterior cervical fusion with plating   SUBMUCOSAL TATTOO INJECTION  06/23/2023   Procedure: SUBMUCOSAL TATTOO INJECTION;  Surgeon: Normie Becton., MD;  Location: [facility];  Service: Gastroenterology;;   TOTAL ABDOMINAL HYSTERECTOMY  1999 **Note De-Identified [place] Obfuscation** WRIST SURGERY Right 2018   Dr. Primus Brookes -ganglion excision    SOCIAL HISTORY: Social History   Socioeconomic History   Marital status: Married    Spouse name: Not on file   Number of children: 0   Years of education: Not on file   Highest education level: Not on file  Occupational History   Occupation: Designer, fashion/clothing: [employer]  Tobacco Use   Smoking status: Never   Smokeless tobacco: Never  Vaping Use   Vaping  status: Never Used  Substance and Sexual Activity   Alcohol use: No   Drug use: No   Sexual activity: Not on file  Other Topics Concern   Not on file  Social History Narrative   Married, no children   Therapist, sports for [employer].   Lives in [place].   1-2 caffeine/day   Social Drivers of Corporate investment banker Strain: Not on file  Food Insecurity: No Food Insecurity (06/24/2023)   Hunger Vital Sign    Worried About Running Out of Food in the Last Year: Never true    Ran Out of Food in the Last Year: Never true  Transportation Needs: No Transportation Needs (06/24/2023)   PRAPARE - Administrator, Civil Service (Medical): No    Lack of Transportation (Non-Medical): No  Physical Activity: Not on file  Stress: Not on file  Social Connections: Moderately Integrated (06/24/2023)   Social Connection and Isolation Panel [NHANES]    Frequency of Communication with Friends and Family: More than three times a week    Frequency of Social Gatherings with Friends and Family: Once a week    Attends Religious Services: 1 to 4 times per year    Active Member of [employer] or Organizations: No    Attends Banker Meetings: Never    Marital Status: Married  Catering manager Violence: Not At Risk (06/24/2023)   Humiliation, Afraid, Rape, and Kick questionnaire    Fear of Current or Ex-Partner: No    Emotionally Abused: No    Physically Abused: No    Sexually Abused: No    FAMILY HISTORY: Family History  Problem Relation Age of Onset   Diabetes Mother    Heart disease Mother    Arthritis Mother    Hearing loss Mother    Hyperlipidemia Mother    Hypertension Mother    Kidney disease Mother    Learning disabilities Mother    Miscarriages / Stillbirths Mother    Heart disease Father    Hypertension Father    Alcohol abuse Father    Hyperlipidemia Father    Scleroderma Sister    Early death Sister        schlederma   Miscarriages / Stillbirths  Sister    Emphysema Sister    Diabetes Sister    Hyperlipidemia Sister    Hypertension Sister    Mental illness Sister    Miscarriages / [place] Sister    Stroke Sister    Diabetes Sister    Heart disease Brother    Cancer Brother        lung   Stroke Brother    Lung cancer Brother        smoker   Diabetes Brother    Emphysema Maternal Grandmother    Cancer Maternal Grandmother        throat and lung(smoker)   Esophageal cancer Maternal Grandmother    Colon cancer Maternal Grandfather **Note De-Identified [place] Obfuscation** Pancreatic cancer Neg Hx    Rectal cancer Neg Hx    Stomach cancer Neg Hx     ALLERGIES:  is allergic to amoxicillin -pot clavulanate, clarithromycin, nitrofurantoin, sulfonamide derivatives, azithromycin , and sulfa antibiotics.  MEDICATIONS:  Current Outpatient Medications  Medication Sig Dispense Refill   anastrozole (ARIMIDEX) 1 MG tablet Take 1 tablet (1 mg total) by mouth daily. 90 tablet 0   acetaminophen  (TYLENOL ) 500 MG tablet Take 1,000 mg by mouth daily as needed (pain.).     citalopram  (CELEXA ) 40 MG tablet Take 1 tablet (40 mg total) by mouth daily. (Patient taking differently: Take 40 mg by mouth every evening.) 90 tablet 3   Cyanocobalamin  (VITAMIN B 12 PO) Take 1 tablet by mouth every evening.     cyclobenzaprine  (FLEXERIL ) 10 MG tablet Take 10 mg by mouth 3 (three) times daily as needed for muscle spasms.     estradiol  (ESTRACE ) 0.1 MG/GM vaginal cream Place 1 Applicatorful vaginally at bedtime. (Patient taking differently: Place 1 Applicatorful vaginally at bedtime as needed (irriation/dryness).) 42.5 g 11   ferrous sulfate 325 (65 FE) MG tablet Take 325 mg by mouth 3 (three) times a week.     gabapentin  (NEURONTIN ) 100 MG capsule Take 100 mg by mouth daily as needed (pain).     gabapentin  (NEURONTIN ) 300 MG capsule Take 300 mg by mouth daily as needed (pain).     Multiple Vitamins-Minerals (MULTIVITAMIN GUMMIES ADULT PO) Take 1 tablet by mouth every evening.      omeprazole  (PRILOSEC) 40 MG capsule Take 1 capsule (40 mg total) by mouth daily before breakfast. 30 mins before breakfast (Patient taking differently: Take 40 mg by mouth at bedtime. 30 mins before breakfast) 90 capsule 3   propranolol  ER (INDERAL  LA) 60 MG 24 hr capsule Take 1 capsule (60 mg total) by mouth daily. (Patient taking differently: Take 60 mg by mouth every evening.) 90 capsule 3   Vitamin D , Ergocalciferol , (DRISDOL ) 1.25 MG (50000 UNIT) CAPS capsule Take 50000IU once weekly. (Patient taking differently: Take 50,000 Units by mouth every Monday.) 12 capsule 3   No current facility-administered medications for this visit.    REVIEW OF SYSTEMS:   Constitutional: Denies fevers, chills or abnormal night sweats Breast: Palpable mass in the right breast All other systems were reviewed with the patient and are negative.  PHYSICAL EXAMINATION: ECOG PERFORMANCE STATUS: 1 - Symptomatic but completely ambulatory  Vitals:   10/28/23 0825  BP: (!) 141/64  Pulse: 73  Resp: 18  Temp: (!) 97.5 F (36.4 C)  SpO2: 100%   Filed Weights   10/28/23 0825  Weight: 186 lb 12.8 oz (84.7 kg)    GENERAL:alert, no distress and comfortable  LABORATORY DATA:  I have reviewed the data as listed Lab Results  Component Value Date   WBC 5.9 08/20/2023   HGB 12.1 08/20/2023   HCT 37.3 08/20/2023   MCV 90.0 08/20/2023   PLT 247.0 08/20/2023   Lab Results  Component Value Date   NA 140 08/20/2023   K 3.7 08/20/2023   CL 107 08/20/2023   CO2 26 08/20/2023    RADIOGRAPHIC STUDIES: I have personally reviewed the radiological reports and agreed with the findings in the report.  ASSESSMENT AND PLAN:  Malignant neoplasm of upper-outer quadrant of right breast in female, estrogen receptor positive (HCC) 10/21/2023:Palpable right breast lump: 2 masses: Anterior mass 2.4 cm (intraductal papilloma previously resected 2017), posterior mass 1.3 cm grade 2 IDC with apocrine features ER 100%, PR **Note De-Identified [place] Obfuscation** 95%,  HER2 negative, Ki-67 5%, 1 enlarged lymph node: Biopsy positive  Pathology and radiology counseling:Discussed with the patient, the details of pathology including the type of breast cancer,the clinical staging, the significance of ER, PR and HER-2/neu receptors and the implications for treatment. After reviewing the pathology in detail, we proceeded to discuss the different treatment options between surgery, radiation, chemotherapy, antiestrogen therapies.  Recommendations: 1. Breast conserving surgery with targeted lymph node dissection 2. Oncotype DX testing to determine if chemotherapy would be of any benefit followed by 3. Adjuvant radiation therapy followed by 4. Adjuvant antiestrogen therapy 5.  Staging scans with CT and bone scan will be done  Oncotype counseling: I discussed Oncotype DX test. I explained to the patient that this is a 21 gene panel to evaluate patient tumors DNA to calculate recurrence score. This would help determine whether patient has high risk or low risk breast cancer. She understands that if her tumor was found to be high risk, she would benefit from systemic chemotherapy. If low risk, no need of chemotherapy.  Cervical decompression and fusion surgery: Because of this her breast surgery will be delayed.  Therefore I recommended starting her on anastrozole 1 mg daily. Anastrozole counseling: We discussed the risks and benefits of anti-estrogen therapy with aromatase inhibitors. These include but not limited to insomnia, hot flashes, mood changes, vaginal dryness, bone density loss, and weight gain. We strongly believe that the benefits far outweigh the risks. Patient understands these risks and consented to starting treatment.   Return to clinic after surgery to discuss Oncotype DX test result  All questions were answered. The patient knows to call the clinic with any problems, questions or concerns.    Viinay K Altin Sease, MD 10/28/23

## 2023-10-28 NOTE — Telephone Encounter (Signed)
**Note De-Identified [place] Obfuscation** Felicia Schultz was seen by a genetic counselor during the breast multidisciplinary clinic on 10/28/2023. In addition to her personal history of breast cancer, she reported a family history of lung cancer in two brothers, lung/throat cancer in her maternal grandmother, and colon cancer in her maternal grandfather, all diagnosed over age 71. She does not meet NCCN criteria for genetic testing at this time. She was still offered genetic counseling and testing but declined. We encourage her to contact us  if there are any changes to her personal or family history of cancer. If she meets NCCN criteria based on the updated personal/family history, she would be recommended to have genetic counseling and testing.   Tyton Abdallah, MS, [facility] Genetic Counselor [email or web address] (P) [phone]

## 2023-10-28 NOTE — Therapy (Signed)
**Note De-Identified [place] Obfuscation** OUTPATIENT PHYSICAL THERAPY BREAST CANCER BASELINE EVALUATION   Patient Name: Felicia Schultz MRN: [id number] DOB:11/07/1950, 73 y.o., female Today's Date: 10/28/2023  END OF SESSION:  PT End of Session - 10/28/23 1123     Visit Number 1    Number of Visits 2    Date for PT Re-Evaluation 12/23/23    PT Start Time 0953    PT Stop Time 1004   Also saw pt from 1023-1048 for a total of 36 min   PT Time Calculation (min) 11 min    Activity Tolerance Patient tolerated treatment well    Behavior During Therapy [facility] for tasks assessed/performed             Past Medical History:  Diagnosis Date   Allergy     Anemia    Atrial flutter (HCC)    typical appearing   Basal cell carcinoma     nose - 1985   GERD (gastroesophageal reflux disease)    GI bleed    H/O cardiac radiofrequency ablation 2015   H/O gastric bypass 2005   History of diabetes mellitus 11/13/2015   Hypertension    Hypokalemia    Osteoporosis 2000   Paroxysmal atrial fibrillation (HCC)    Personal history of colonic polyps - sessile serrated 11/23/2013   Past Surgical History:  Procedure Laterality Date   APPENDECTOMY     ATRIAL FLUTTER ABLATION N/A 09/05/2014   Procedure: ATRIAL FLUTTER ABLATION;  Surgeon: Jolly Needle, MD;  Location: [facility];  Service: Cardiovascular;  Laterality: N/A;   BIOPSY  06/23/2023   Procedure: BIOPSY;  Surgeon: Normie Becton., MD;  Location: [facility];  Service: Gastroenterology;;   BREAST BIOPSY Right 10/21/2023   US  RT BREAST BX W LOC DEV 1ST LESION IMG BX SPEC US  GUIDE 10/21/2023 [facility]   BREAST BIOPSY Right 10/21/2023   US  RT BREAST BX W LOC DEV EA ADD LESION IMG BX SPEC US  GUIDE 10/21/2023 [facility]   BREAST EXCISIONAL BIOPSY Right 01/2016   CARPAL TUNNEL RELEASE Bilateral    CHOLECYSTECTOMY     ENTEROSCOPY N/A 06/23/2023   Procedure: ENTEROSCOPY;  Surgeon: Normie Becton., MD;  Location: [facility];  Service: Gastroenterology;  Laterality:  N/A;   FOOT SURGERY Bilateral 2012   shorten bones in both feet   GASTRIC BYPASS     removal of ovary Right 1987   SPINE SURGERY  2000/2012   anterior cervical fusion with plating   SUBMUCOSAL TATTOO INJECTION  06/23/2023   Procedure: SUBMUCOSAL TATTOO INJECTION;  Surgeon: Normie Becton., MD;  Location: [facility];  Service: Gastroenterology;;   TOTAL ABDOMINAL HYSTERECTOMY  1999   WRIST SURGERY Right 2018   Dr. Primus Brookes -ganglion excision   Patient Active Problem List   Diagnosis Date Noted   Malignant neoplasm of upper-outer quadrant of right breast in female, estrogen receptor positive (HCC) 10/26/2023   Obesity 08/20/2023   Thyroid  nodule 06/30/2023   Vitamin B12 deficiency 08/18/2022   Tremor 08/14/2021   History of atrial fibrillation 08/03/2020   Bilateral dry eyes 03/28/2020   Vitreous membranes and strands, right 03/28/2020   Nuclear sclerotic cataract of right eye 03/28/2020   Nuclear sclerotic cataract of left eye 03/28/2020   Posterior vitreous detachment of left eye 03/28/2020   Posterior vitreous detachment of right eye 03/28/2020   Hyperglycemia 02/18/2019   S/P gastric bypass 07/09/2018   Achilles tendinosis 04/30/2018   Spondylosis of lumbar joint 05/28/2017   Plantar fasciitis, bilateral 05/28/2017 **Note De-Identified [place]ie Obfuscation** Chronic pain of right wrist 05/28/2017   Haglund's deformity of right heel 02/02/2017   Postmenopausal symptoms 06/02/2016   Cervical spondylosis 07/19/2014   GERD (gastroesophageal reflux disease) 07/19/2014   Vitamin D  deficiency 07/19/2014   History of colonic polyps 11/23/2013   Allergic rhinitis 11/11/2011   REFERRING PROVIDER: Dr. Lockie Rima   REFERRING DIAG: Right breast cancer  THERAPY DIAG:  Cervicalgia  Malignant neoplasm of upper-outer quadrant of right breast in female, estrogen receptor positive (HCC)  Abnormal posture  Rationale for Evaluation and Treatment: Rehabilitation  ONSET DATE: 10/19/2023  SUBJECTIVE:                                                                                                                                                                                            SUBJECTIVE STATEMENT: Patient reports she is here today to be seen by her medical team for her newly diagnosed right breast cancer.   PERTINENT HISTORY:  Patient was diagnosed on 10/19/2023 with right grade 2 invasive ductal carcinoma breast cancer. It measures 1.3 cm and is located in the upper outer quadrant. It is ER/PR positive and HER2 negative with a Ki67 of 5%. She has a biopsied proven positive axillary lymph node.She has had several spine surgeries including a C4-5 discectomy and fusion in 2011 and a C6-7 discectomy in 2000. She has atrial fibrillation and is scheduled for an anterior cervical decompression and discectomy with fusion on 11/11/2023. She reports left arm weakness as a result of her cervical problems.  PATIENT GOALS:   reduce lymphedema risk and learn post op HEP.   PAIN:  Are you having pain? Yes: NPRS scale: 8/10 Pain location: neck and left upper extremity Pain description: throbbing, stabbing, muscle cramps Aggravating factors: AROM Relieving factors: unknown  PRECAUTIONS: Active CA Other: Pt reports cord compression from her cervical neck issues  RED FLAGS: None   HAND DOMINANCE: right  WEIGHT BEARING RESTRICTIONS: No  FALLS:  Has patient fallen in last 6 months? No  LIVING ENVIRONMENT: Patient lives with: her husband Lives in: House/apartment Has following equipment at home: None  OCCUPATION: Chief Strategy Officer; at desk  LEISURE: She is unable ot do much due to her cord compression and reports she can't walk long distance due to back pain  PRIOR LEVEL OF FUNCTION: Independent   OBJECTIVE: Note: Objective measures were completed at Evaluation unless otherwise noted.  COGNITION: Overall cognitive status: Within functional limits for tasks assessed    POSTURE:   Forward head and rounded shoulders posture  UPPER EXTREMITY AROM/PROM:  A/PROM  All limited by neck pain RIGHT   eval **Note De-Identified [place] Obfuscation** Shoulder extension 42  Shoulder flexion 136  Shoulder abduction 140  Shoulder internal rotation 37  Shoulder external rotation 66    (Blank rows = not tested)  A/PROM LEFT   eval  Shoulder extension 35  Shoulder flexion 128  Shoulder abduction 145  Shoulder internal rotation 60  Shoulder external rotation 67    (Blank rows = not tested)  CERVICAL AROM:   Percent limited  Flexion WNL  Extension 50% limited  Right lateral flexion 50% limited  Left lateral flexion 50% limited  Right rotation 75% limited  Left rotation 75% limited    UPPER EXTREMITY STRENGTH: Not tested due to neck pain  LYMPHEDEMA ASSESSMENTS (in cm):   LANDMARK RIGHT   eval  10 cm proximal to olecranon process 31.5  Olecranon process 25.7  10 cm proximal to ulnar styloid process 24.1  Just proximal to ulnar styloid process 16.2  Across hand at thumb web space 17.9  At base of 2nd digit 6.5  (Blank rows = not tested)  LANDMARK LEFT   eval  10 cm proximal to olecranon process 32.6  Olecranon process 25.9  10 cm proximal to ulnar styloid process 23.8  Just proximal to ulnar styloid process 17.6  Across hand at thumb web space 17.2  At base of 2nd digit 6.3  (Blank rows = not tested)  L-DEX LYMPHEDEMA SCREENING:  The patient was assessed using the L-Dex machine today to produce a lymphedema index baseline score. The patient will be reassessed on a regular basis (typically every 3 months) to obtain new L-Dex scores. If the score is > 6.5 points away from his/her baseline score indicating onset of subclinical lymphedema, it will be recommended to wear a compression garment for 4 weeks, 12 hours per day and then be reassessed. If the score continues to be > 6.5 points from baseline at reassessment, we will initiate lymphedema treatment. Assessing in this manner has a 95% rate  of preventing clinically significant lymphedema.   L-DEX FLOWSHEETS - 10/28/23 1100       L-DEX LYMPHEDEMA SCREENING   Measurement Type Unilateral    L-DEX MEASUREMENT EXTREMITY Upper Extremity    POSITION  Standing    DOMINANT SIDE Right    At Risk Side Right    BASELINE SCORE (UNILATERAL) -9.3             QUICK DASH SURVEY:  Cindia Crease - 10/28/23 0001     Open a tight or new jar No difficulty    Do heavy household chores (wash walls, wash floors) Mild difficulty    Carry a shopping bag or briefcase Mild difficulty    Wash your back Severe difficulty    Use a knife to cut food No difficulty    Recreational activities in which you take some force or impact through your arm, shoulder, or hand (golf, hammering, tennis) Moderate difficulty    During the past week, to what extent has your arm, shoulder or hand problem interfered with your normal social activities with family, friends, neighbors, or groups? Modererately    During the past week, to what extent has your arm, shoulder or hand problem limited your work or other regular daily activities Modererately    Arm, shoulder, or hand pain. Moderate    Tingling (pins and needles) in your arm, shoulder, or hand None    Difficulty Sleeping Mild difficulty    DASH Score 31.82 % **Note De-Identified [place] Obfuscation** PATIENT EDUCATION:  Education details: Time spent educating patient on aspects of self-care to maximize post op recovery. Patient was educated on where and how to get a post op compression bra to use to reduce post op edema. Patient was also educated on the use of SOZO screenings and surveillance principles for early identification of lymphedema onset. She was instructed to use the post op pillow in the axilla for pressure and pain relief. Patient educated on lymphedema risk reduction and post op shoulder/posture HEP. Person educated: Patient Education method: Explanation, Demonstration, Handout Education comprehension: Patient verbalized  understanding and returned demonstration  HOME EXERCISE PROGRAM: Patient was instructed today in a home exercise program today for post op shoulder range of motion. These included active assist shoulder flexion in sitting, scapular retraction, wall walking with shoulder abduction, and hands behind head external rotation.  She was encouraged to do these twice a day, holding 3 seconds and repeating 5 times when permitted by her physician.   ASSESSMENT:  CLINICAL IMPRESSION: Patient was diagnosed on 10/19/2023 with right grade 2 invasive ductal carcinoma breast cancer. It measures 1.3 cm and is located in the upper outer quadrant. It is ER/PR positive and HER2 negative with a Ki67 of 5%. She has a biopsied proven positive axillary lymph node. She has had several spine surgeries including a C4-5 discectomy and fusion in 2011 and a C6-7 discectomy in 2000. She has atrial fibrillation and is scheduled for an anterior cervical decompression and discectomy with fusion on 11/11/2023. She reports left arm weakness as a result of her cervical problems.Her multidisciplinary medical team met prior to her assessments to determine a recommended treatment plan. She is planning to have staging scans due to her multiple areas of pain followed by a right lumpectomy and targeted axillary lymph node dissection, Oncotype testing, radiation, and anti-estrogen therapy. She will benefit from a post op PT reassessment to determine needs and from L-Dex screens every 3 months for 2 years to detect subclinical lymphedema.  Pt will benefit from skilled therapeutic intervention to improve on the following deficits: Decreased knowledge of precautions, impaired UE functional use, pain, decreased ROM, postural dysfunction.   PT treatment/interventions: ADL/self-care home management, pt/family education, therapeutic exercise  REHAB POTENTIAL: Good  CLINICAL DECISION MAKING: Stable/uncomplicated  EVALUATION COMPLEXITY:  Low   GOALS: Goals reviewed with patient? YES  LONG TERM GOALS: (STG=LTG)    Name Target Date Goal status  1 Pt will be able to verbalize understanding of pertinent lymphedema risk reduction practices relevant to her dx specifically related to skin care.  Baseline:  No knowledge 10/28/2023 Achieved at eval  2 Pt will be able to return demo and/or verbalize understanding of the post op HEP related to regaining shoulder ROM. Baseline:  No knowledge 10/28/2023 Achieved at eval  3 Pt will be able to verbalize understanding of the importance of viewing the post op After Breast CA Class video for further lymphedema risk reduction education and therapeutic exercise.  Baseline:  No knowledge 10/28/2023 Achieved at eval  4 Pt will demo she has regained full shoulder ROM and function post operatively compared to baselines.  Baseline: See objective measurements taken today. 12/23/2023     PLAN:  PT FREQUENCY/DURATION: EVAL and 1 follow up appointment.   PLAN FOR NEXT SESSION: will reassess 3-4 weeks post op to determine needs.   Patient will follow up at outpatient cancer rehab 3-4 weeks following surgery.  If the patient requires physical therapy at that time, a specific plan will **Note De-Identified Name Obfuscation** be dictated and sent to the referring physician for approval. The patient was educated today on appropriate basic range of motion exercises to begin post operatively and the importance of viewing the After Breast Cancer class video following surgery.  Patient was educated today on lymphedema risk reduction practices as it pertains to recommendations that will benefit the patient immediately following surgery.  She verbalized good understanding.    Physical Therapy Information for After Breast Cancer Surgery/Treatment:  Lymphedema is a swelling condition that you may be at risk for in your arm if you have lymph nodes removed from the armpit area.  After a sentinel node biopsy, the risk is approximately 5-9% and is higher  after an axillary node dissection.  There is treatment available for this condition and it is not life-threatening.  Contact your physician or physical therapist with concerns. You may begin the 4 shoulder/posture exercises (see additional sheet) when permitted by your physician (typically a week after surgery).  If you have drains, you may need to wait until those are removed before beginning range of motion exercises.  A general recommendation is to not lift your arms above shoulder height until drains are removed.  These exercises should be done to your tolerance and gently.  This is not a "no pain/no gain" type of recovery so listen to your body and stretch into the range of motion that you can tolerate, stopping if you have pain.  If you are having immediate reconstruction, ask your plastic surgeon about doing exercises as he or she may want you to wait. We encourage you to view the After Breast Cancer class video following surgery.  You will learn information related to lymphedema risk, prevention and treatment and additional exercises to regain mobility following surgery.   While undergoing any medical procedure or treatment, try to avoid blood pressure being taken or needle sticks from occurring on the arm on the side of cancer.   This recommendation begins after surgery and continues for the rest of your life.  This may help reduce your risk of getting lymphedema (swelling in your arm). An excellent resource for those seeking information on lymphedema is the National Lymphedema Network's web site. It can be accessed at www.lymphnet.org If you notice swelling in your hand, arm or breast at any time following surgery (even if it is many years from now), please contact your doctor or physical therapist to discuss this.  Lymphedema can be treated at any time but it is easier for you if it is treated early on.  If you feel like your shoulder motion is not returning to normal in a reasonable amount of time,  please contact your surgeon or physical therapist.  [facility] [phone]. [address], [address], [place] [address]  [facility] CLASS After Breast Cancer Class  After Breast Cancer Class is a specially designed exercise class video to assist you in a safe recover after having breast cancer surgery.  In this video you will learn how to get back to full function whether your drains were just removed or if you had surgery a month ago. The video can be viewed on this page: [email or web address].boyd-meyer.org/ or on YouTube here: [email or web address]://[email or web address].  Class Goals  Understand specific stretches to improve the flexibility of you chest and shoulder. Learn ways to safely strengthen your upper body and improve your posture. Understand the warning signs of infection and why you may be at risk for an arm infection. Learn about Lymphedema and **Note De-Identified [place] Obfuscation** prevention.  ** You do not need to view this video until after surgery.  Drains should be removed to participate in the recommended exercises on the video.  Patient was instructed today in a home exercise program today for post op shoulder range of motion. These included active assist shoulder flexion in sitting, scapular retraction, wall walking with shoulder abduction, and hands behind head external rotation.  She was encouraged to do these twice a day, holding 3 seconds and repeating 5 times when permitted by her physician.  Rollin Clock, [place] 10/28/23 11:36 AM

## 2023-10-28 NOTE — Telephone Encounter (Signed)
**Note De-Identified [place] Obfuscation** Received order for Oncotype Testing per Dr. Lee Public on CORE bx. Requisition faxed to pathology and [facility]

## 2023-10-29 ENCOUNTER — Other Ambulatory Visit: Payer: Self-pay | Admitting: General Surgery

## 2023-10-29 DIAGNOSIS — Z170 Estrogen receptor positive status [ER+]: Secondary | ICD-10-CM

## 2023-10-29 NOTE — Pre-Procedure Instructions (Signed)
**Note De-Identified Andonian Obfuscation** Surgical Instructions   Your procedure is scheduled on Nov 11, 2023. Report to [facility] Main Entrance "A" at 10:45 A.M., then check in with the Admitting office. Any questions or running late day of surgery: call [phone]  Questions prior to your surgery date: call [phone], Monday-Friday, 8am-4pm. If you experience any cold or flu symptoms such as cough, fever, chills, shortness of breath, etc. between now and your scheduled surgery, please notify us  at the above number.     Remember:  Do not eat or drink after midnight the night before your surgery    Take these medicines the morning of surgery with A SIP OF WATER: anastrozole (ARIMIDEX)  omeprazole  (PRILOSEC)    May take these medicines IF NEEDED: acetaminophen  (TYLENOL )  cyclobenzaprine  (FLEXERIL )  gabapentin  (NEURONTIN )    One week prior to surgery, STOP taking any Aspirin (unless otherwise instructed by your surgeon) Aleve, Naproxen, Ibuprofen, Motrin, Advil, Goody's, BC's, all herbal medications, fish oil, and non-prescription vitamins.                     Do NOT Smoke (Tobacco/Vaping) for 24 hours prior to your procedure.  If you use a CPAP at night, you may bring your mask/headgear for your overnight stay.   You will be asked to remove any contacts, glasses, piercing's, hearing aid's, dentures/partials prior to surgery. Please bring cases for these items if needed.    Patients discharged the day of surgery will not be allowed to drive home, and someone needs to stay with them for 24 hours.  SURGICAL WAITING ROOM VISITATION Patients may have no more than 2 support people in the waiting area - these visitors may rotate.   Pre-op nurse will coordinate an appropriate time for 1 ADULT support person, who may not rotate, to accompany patient in pre-op.  Children under the age of 78 must have an adult with them who is not the patient and must remain in the main waiting area with an adult.  If the patient needs to  stay at the hospital during part of their recovery, the visitor guidelines for inpatient rooms apply.  Please refer to the [facility] website for the visitor guidelines for any additional information.   If you received a COVID test during your pre-op visit  it is requested that you wear a mask when out in public, stay away from anyone that may not be feeling well and notify your surgeon if you develop symptoms. If you have been in contact with anyone that has tested positive in the last 10 days please notify you surgeon.      Pre-operative 5 CHG Bathing Instructions   You can play a key role in reducing the risk of infection after surgery. Your skin needs to be as free of germs as possible. You can reduce the number of germs on your skin by washing with CHG (chlorhexidine gluconate) soap before surgery. CHG is an antiseptic soap that kills germs and continues to kill germs even after washing.   DO NOT use if you have an allergy  to chlorhexidine/CHG or antibacterial soaps. If your skin becomes reddened or irritated, stop using the CHG and notify one of our RNs at [phone].   Please shower with the CHG soap starting 4 days before surgery using the following schedule:     Please keep in mind the following:  DO NOT shave, including legs and underarms, starting the day of your first shower.   You may shave your **Note De-Identified Felicia Schultz Obfuscation** face at any point before/day of surgery.  Place clean sheets on your bed the day you start using CHG soap. Use a clean washcloth (not used since being washed) for each shower. DO NOT sleep with pets once you start using the CHG.   CHG Shower Instructions:  Wash your face and private area with normal soap. If you choose to wash your hair, wash first with your normal shampoo.  After you use shampoo/soap, rinse your hair and body thoroughly to remove shampoo/soap residue.  Turn the water OFF and apply about 3 tablespoons (45 ml) of CHG soap to a CLEAN washcloth.  Apply CHG soap  ONLY FROM YOUR NECK DOWN TO YOUR TOES (washing for 3-5 minutes)  DO NOT use CHG soap on face, private areas, open wounds, or sores.  Pay special attention to the area where your surgery is being performed.  If you are having back surgery, having someone wash your back for you may be helpful. Wait 2 minutes after CHG soap is applied, then you may rinse off the CHG soap.  Pat dry with a clean towel  Put on clean clothes/pajamas   If you choose to wear lotion, please use ONLY the CHG-compatible lotions that are listed below.  Additional instructions for the day of surgery: DO NOT APPLY any lotions, deodorants, cologne, or perfumes.   Do not bring valuables to the hospital. [facility] is not responsible for any belongings/valuables. Do not wear nail polish, gel polish, artificial nails, or any other type of covering on natural nails (fingers and toes) Do not wear jewelry or makeup Put on clean/comfortable clothes.  Please brush your teeth.  Ask your nurse before applying any prescription medications to the skin.     CHG Compatible Lotions   Aveeno Moisturizing lotion  Cetaphil Moisturizing Cream  Cetaphil Moisturizing Lotion  Clairol Herbal Essence Moisturizing Lotion, Dry Skin  Clairol Herbal Essence Moisturizing Lotion, Extra Dry Skin  Clairol Herbal Essence Moisturizing Lotion, Normal Skin  Curel Age Defying Therapeutic Moisturizing Lotion with Alpha Hydroxy  Curel Extreme Care Body Lotion  Curel Soothing Hands Moisturizing Hand Lotion  Curel Therapeutic Moisturizing Cream, Fragrance-Free  Curel Therapeutic Moisturizing Lotion, Fragrance-Free  Curel Therapeutic Moisturizing Lotion, Original Formula  Eucerin Daily Replenishing Lotion  Eucerin Dry Skin Therapy Plus Alpha Hydroxy Crme  Eucerin Dry Skin Therapy Plus Alpha Hydroxy Lotion  Eucerin Original Crme  Eucerin Original Lotion  Eucerin Plus Crme Eucerin Plus Lotion  Eucerin TriLipid Replenishing Lotion  Keri  Anti-Bacterial Hand Lotion  Keri Deep Conditioning Original Lotion Dry Skin Formula Softly Scented  Keri Deep Conditioning Original Lotion, Fragrance Free Sensitive Skin Formula  Keri Lotion Fast Absorbing Fragrance Free Sensitive Skin Formula  Keri Lotion Fast Absorbing Softly Scented Dry Skin Formula  Keri Original Lotion  Keri Skin Renewal Lotion Keri Silky Smooth Lotion  Keri Silky Smooth Sensitive Skin Lotion  Nivea Body Creamy Conditioning Oil  Nivea Body Extra Enriched Lotion  Nivea Body Original Lotion  Nivea Body Sheer Moisturizing Lotion Nivea Crme  Nivea Skin Firming Lotion  NutraDerm 30 Skin Lotion  NutraDerm Skin Lotion  NutraDerm Therapeutic Skin Cream  NutraDerm Therapeutic Skin Lotion  ProShield Protective Hand Cream  Provon moisturizing lotion  Please read over the following fact sheets that you were given.

## 2023-10-30 ENCOUNTER — Other Ambulatory Visit: Payer: Self-pay

## 2023-10-30 ENCOUNTER — Encounter (HOSPITAL_COMMUNITY)
Admission: RE | Admit: 2023-10-30 | Discharge: 2023-10-30 | Disposition: A | Discharge status: Home / Self Care | Source: Ambulatory Visit | Attending: Neurosurgery | Admitting: Neurosurgery

## 2023-10-30 ENCOUNTER — Encounter (HOSPITAL_COMMUNITY): Payer: Self-pay

## 2023-10-30 VITALS — BP 125/79 | HR 59 | Temp 97.8°F | Resp 17 | Ht 64.0 in | Wt 186.9 lb

## 2023-10-30 DIAGNOSIS — Z01818 Encounter for other preprocedural examination: Secondary | ICD-10-CM | POA: Diagnosis present

## 2023-10-30 DIAGNOSIS — I491 Atrial premature depolarization: Secondary | ICD-10-CM | POA: Insufficient documentation

## 2023-10-30 LAB — TYPE AND SCREEN
ABO/RH(D): O POS
Antibody Screen: NEGATIVE

## 2023-10-30 LAB — SURGICAL PCR SCREEN
MRSA, PCR: NEGATIVE
Staphylococcus aureus: NEGATIVE

## 2023-10-30 NOTE — Progress Notes (Signed)
**Note De-Identified Reth Obfuscation** PCP - Rodney Clamp, MD  Cardiologist -   PPM/ICD - denies Device Orders - n/a Rep Notified - n/a  Chest x-ray -  EKG - 10-30-23 Stress Test - 10-12-13 ECHO - 11-24-13 Cardiac Cath - Ablation 2016  Sleep Study - denies CPAP - n/a  DM - denies  Blood Thinner Instructions:denies Aspirin Instructions:n/a  ERAS Protcol - NPO   COVID TEST-    Anesthesia review: yes  Patient denies shortness of breath, fever, cough and chest pain at PAT appointment   All instructions explained to the patient, with a verbal understanding of the material. Patient agrees to go over the instructions while at home for a better understanding. Patient also instructed to self quarantine after being tested for COVID-19. The opportunity to ask questions was provided.

## 2023-11-02 ENCOUNTER — Encounter: Payer: Self-pay | Admitting: *Deleted

## 2023-11-02 NOTE — Progress Notes (Signed)
**Note De-Identified Stickels Obfuscation** Anesthesia Chart Review:  73 year old female with pertinent history including gastric bypass 2005, anemia, right breast cancer, GIB, HTN, paroxysmal A-fib/flutter s/p successful ablation 2015, GERD on PPI.  She  has a history of right intraductal papilloma previously resected in 2017.  She was recently diagnosed with right grade 2 IDC and had follow-up with oncologist Dr. Gudena on 10/28/2023.  It was discussed at that time that her breast surgery will be delayed due to upcoming cervical decompression and fusion surgery.  Therefore, she was recommended to start anastrozole  1 mg daily.  History of prior C4-5 and C6-7 ACDF.  History of difficult airway.  Per anesthesia note 07/19/2020 from [facility], "Pt with very anterior airway, some difficulty with positioning ETT through cords d/t anterior a/w and small mouth opening."  Glide scope was used electively at that time.  Preop labs reviewed, unremarkable.  EKG 10/30/2023: Sinus rhythm with Premature atrial complexes.  Rate 69. No significant change since last tracing.  TTE 11/24/2013: - Left ventricle: The cavity size was mildly dilated. Wall    thickness was normal. Systolic function was normal. The estimated    ejection fraction was in the range of 55% to 60%.  - Mitral valve: There was mild regurgitation.  - Left atrium: The atrium was moderately dilated.     Edilia Gordon [facility]/Anesthesiology Phone [phone] 11/02/2023 3:52 PM

## 2023-11-02 NOTE — Anesthesia Preprocedure Evaluation (Signed)
**Note De-Identified Rosenzweig Obfuscation** Anesthesia Evaluation  Patient identified by MRN, date of birth, ID band Patient awake    Reviewed: Allergy  & Precautions, NPO status , Patient's Chart, lab work & pertinent test results  History of Anesthesia Complications (+) DIFFICULT AIRWAY and history of anesthetic complications  Airway Mallampati: II  TM Distance: <3 FB Neck ROM: Limited  Mouth opening: Limited Mouth Opening  Dental no notable dental hx. (+) Teeth Intact, Dental Advisory Given   Pulmonary neg pulmonary ROS   Pulmonary exam normal        Cardiovascular hypertension, Pt. on medications Normal cardiovascular exam+ dysrhythmias (s/p ablation) Atrial Fibrillation      Neuro/Psych Tremor  negative psych ROS   GI/Hepatic Neg liver ROS,GERD  ,,  Endo/Other  negative endocrine ROS    Renal/GU negative Renal ROS     Musculoskeletal  (+) Arthritis ,    Abdominal  (+) + obese  Peds  Hematology  (+) Blood dyscrasia, anemia   Anesthesia Other Findings   Reproductive/Obstetrics                             Anesthesia Physical Anesthesia Plan  ASA: 3  Anesthesia Plan: General   Post-op Pain Management: Minimal or no pain anticipated, Tylenol  PO (pre-op)* and Celebrex PO (pre-op)*   Induction: Intravenous  PONV Risk Score and Plan: 2 and Propofol  infusion and Treatment may vary due to age or medical condition  Airway Management Planned: Oral ETT and Video Laryngoscope Planned  Additional Equipment: None  Intra-op Plan:   Post-operative Plan: Extubation in OR  Informed Consent: I have reviewed the patients History and Physical, chart, labs and discussed the procedure including the risks, benefits and alternatives for the proposed anesthesia with the patient or authorized representative who has indicated his/her understanding and acceptance.       Plan Discussed with: CRNA and Anesthesiologist  Anesthesia Plan  Comments: (PAT note by Rudy Costain, PA-C: 73 year old female with pertinent history including gastric bypass 2005, anemia, right breast cancer, GIB, HTN, paroxysmal A-fib/flutter s/p successful ablation 2015, GERD on PPI.  She  has a history of right intraductal papilloma previously resected in 2017.  She was recently diagnosed with right grade 2 IDC and had follow-up with oncologist Dr. Gudena on 10/28/2023.  It was discussed at that time that her breast surgery will be delayed due to upcoming cervical decompression and fusion surgery.  Therefore, she was recommended to start anastrozole  1 mg daily.  History of prior C4-5 and C6-7 ACDF.  History of difficult airway.  Per anesthesia note 07/19/2020 from [facility], "Pt with very anterior airway, some difficulty with positioning ETT through cords d/t anterior a/w and small mouth opening."  Glide scope was used electively at that time.  Preop labs reviewed, unremarkable.  EKG 10/30/2023: Sinus rhythm with Premature atrial complexes.  Rate 69. No significant change since last tracing.  TTE 11/24/2013: - Left ventricle: The cavity size was mildly dilated. Wall  thickness was normal. Systolic function was normal. The estimated  ejection fraction was in the range of 55% to 60%.  - Mitral valve: There was mild regurgitation.  - Left atrium: The atrium was moderately dilated.   )       Anesthesia Quick Evaluation

## 2023-11-05 ENCOUNTER — Encounter: Payer: Self-pay | Admitting: *Deleted

## 2023-11-05 ENCOUNTER — Telehealth: Payer: Self-pay | Admitting: *Deleted

## 2023-11-05 ENCOUNTER — Encounter (HOSPITAL_COMMUNITY)
Admission: RE | Admit: 2023-11-05 | Discharge: 2023-11-05 | Disposition: A | Discharge status: Home / Self Care | Source: Ambulatory Visit | Attending: Hematology and Oncology | Admitting: Hematology and Oncology

## 2023-11-05 ENCOUNTER — Ambulatory Visit (HOSPITAL_COMMUNITY)
Admission: RE | Admit: 2023-11-05 | Discharge: 2023-11-05 | Disposition: A | Discharge status: Home / Self Care | Source: Ambulatory Visit | Attending: Hematology and Oncology | Admitting: Hematology and Oncology

## 2023-11-05 DIAGNOSIS — C50411 Malignant neoplasm of upper-outer quadrant of right female breast: Secondary | ICD-10-CM | POA: Insufficient documentation

## 2023-11-05 DIAGNOSIS — Z170 Estrogen receptor positive status [ER+]: Secondary | ICD-10-CM | POA: Insufficient documentation

## 2023-11-05 MED ORDER — TECHNETIUM TC 99M MEDRONATE IV KIT
20.0000 | PACK | Freq: Once | INTRAVENOUS | Status: AC | PRN
  Administered 2023-11-05: 20 via INTRAVENOUS

## 2023-11-05 MED ORDER — IOHEXOL 9 MG/ML PO SOLN
ORAL | Status: AC
  Filled 2023-11-05: qty 1000

## 2023-11-05 MED ORDER — SODIUM CHLORIDE (PF) 0.9 % IJ SOLN
INTRAMUSCULAR | Status: AC
  Filled 2023-11-05: qty 50

## 2023-11-05 MED ORDER — IOHEXOL 9 MG/ML PO SOLN
1000.0000 mL | Freq: Once | ORAL | Status: AC
  Administered 2023-11-05: 1000 mL via ORAL

## 2023-11-05 MED ORDER — IOHEXOL 300 MG/ML  SOLN
100.0000 mL | Freq: Once | INTRAMUSCULAR | Status: AC | PRN
  Administered 2023-11-05: 100 mL via INTRAVENOUS

## 2023-11-05 NOTE — Telephone Encounter (Signed)
**Note De-Identified Capell Obfuscation** Spoke with patient to follow up from [facility] 5/14 and assess navigation needs. Patient denies any questions or concerns at this time. Encouraged her to call should anything arise.

## 2023-11-06 ENCOUNTER — Other Ambulatory Visit (HOSPITAL_COMMUNITY)

## 2023-11-06 ENCOUNTER — Ambulatory Visit (HOSPITAL_COMMUNITY)

## 2023-11-06 ENCOUNTER — Telehealth: Payer: Self-pay | Admitting: Licensed Clinical Social Worker

## 2023-11-06 ENCOUNTER — Encounter: Payer: Self-pay | Admitting: *Deleted

## 2023-11-06 NOTE — Telephone Encounter (Signed)
**Note De-Identified Lingenfelter Obfuscation** [facility] Clinical Social Work  CSW attempted to contact pt by phone to follow up on coping and emotional well being since [facility]. No answer. Left VM with direct contact information.   Anahla Bevis E Imaya Duffy, LCSW

## 2023-11-10 ENCOUNTER — Ambulatory Visit: Payer: Self-pay | Admitting: Hematology and Oncology

## 2023-11-10 NOTE — Telephone Encounter (Signed)
**Note De-Identified Lardizabal Obfuscation** I left a voice mail that the CT scans did not show any evidence of distant metastatic disease other than the enlarged lymph node.

## 2023-11-11 ENCOUNTER — Ambulatory Visit (HOSPITAL_COMMUNITY): Payer: Self-pay | Admitting: Physician Assistant

## 2023-11-11 ENCOUNTER — Other Ambulatory Visit: Payer: Self-pay

## 2023-11-11 ENCOUNTER — Encounter (HOSPITAL_COMMUNITY)
Admission: RE | Disposition: A | Discharge status: Home / Self Care | Payer: Self-pay | Source: Home / Self Care | Attending: Neurosurgery

## 2023-11-11 ENCOUNTER — Ambulatory Visit (HOSPITAL_COMMUNITY)

## 2023-11-11 ENCOUNTER — Encounter (HOSPITAL_COMMUNITY): Payer: Self-pay | Admitting: Neurosurgery

## 2023-11-11 ENCOUNTER — Inpatient Hospital Stay (HOSPITAL_COMMUNITY)
Admission: RE | Admit: 2023-11-11 | Discharge: 2023-11-13 | DRG: 472 | Disposition: A | Discharge status: Home / Self Care | Attending: Neurosurgery | Admitting: Neurosurgery

## 2023-11-11 DIAGNOSIS — Z8249 Family history of ischemic heart disease and other diseases of the circulatory system: Secondary | ICD-10-CM

## 2023-11-11 DIAGNOSIS — S15101A Unspecified injury of right vertebral artery, initial encounter: Secondary | ICD-10-CM | POA: Diagnosis present

## 2023-11-11 DIAGNOSIS — I483 Typical atrial flutter: Secondary | ICD-10-CM | POA: Diagnosis present

## 2023-11-11 DIAGNOSIS — M960 Pseudarthrosis after fusion or arthrodesis: Secondary | ICD-10-CM | POA: Diagnosis present

## 2023-11-11 DIAGNOSIS — I10 Essential (primary) hypertension: Secondary | ICD-10-CM | POA: Diagnosis present

## 2023-11-11 DIAGNOSIS — Z833 Family history of diabetes mellitus: Secondary | ICD-10-CM | POA: Diagnosis not present

## 2023-11-11 DIAGNOSIS — Z882 Allergy status to sulfonamides status: Secondary | ICD-10-CM

## 2023-11-11 DIAGNOSIS — Z83438 Family history of other disorder of lipoprotein metabolism and other lipidemia: Secondary | ICD-10-CM | POA: Diagnosis not present

## 2023-11-11 DIAGNOSIS — Z822 Family history of deafness and hearing loss: Secondary | ICD-10-CM | POA: Diagnosis not present

## 2023-11-11 DIAGNOSIS — Z880 Allergy status to penicillin: Secondary | ICD-10-CM

## 2023-11-11 DIAGNOSIS — M4722 Other spondylosis with radiculopathy, cervical region: Secondary | ICD-10-CM

## 2023-11-11 DIAGNOSIS — Z85828 Personal history of other malignant neoplasm of skin: Secondary | ICD-10-CM | POA: Diagnosis not present

## 2023-11-11 DIAGNOSIS — C50911 Malignant neoplasm of unspecified site of right female breast: Secondary | ICD-10-CM | POA: Diagnosis present

## 2023-11-11 DIAGNOSIS — Z8261 Family history of arthritis: Secondary | ICD-10-CM

## 2023-11-11 DIAGNOSIS — Z881 Allergy status to other antibiotic agents status: Secondary | ICD-10-CM

## 2023-11-11 DIAGNOSIS — K219 Gastro-esophageal reflux disease without esophagitis: Secondary | ICD-10-CM | POA: Diagnosis present

## 2023-11-11 DIAGNOSIS — Z888 Allergy status to other drugs, medicaments and biological substances status: Secondary | ICD-10-CM | POA: Diagnosis not present

## 2023-11-11 DIAGNOSIS — Z79899 Other long term (current) drug therapy: Secondary | ICD-10-CM

## 2023-11-11 DIAGNOSIS — Z9049 Acquired absence of other specified parts of digestive tract: Secondary | ICD-10-CM

## 2023-11-11 DIAGNOSIS — Z841 Family history of disorders of kidney and ureter: Secondary | ICD-10-CM

## 2023-11-11 DIAGNOSIS — I480 Paroxysmal atrial fibrillation: Secondary | ICD-10-CM | POA: Diagnosis present

## 2023-11-11 DIAGNOSIS — M810 Age-related osteoporosis without current pathological fracture: Secondary | ICD-10-CM | POA: Diagnosis present

## 2023-11-11 DIAGNOSIS — I9751 Accidental puncture and laceration of a circulatory system organ or structure during a circulatory system procedure: Secondary | ICD-10-CM | POA: Diagnosis present

## 2023-11-11 DIAGNOSIS — Z79811 Long term (current) use of aromatase inhibitors: Secondary | ICD-10-CM | POA: Diagnosis not present

## 2023-11-11 DIAGNOSIS — Z801 Family history of malignant neoplasm of trachea, bronchus and lung: Secondary | ICD-10-CM

## 2023-11-11 DIAGNOSIS — Z825 Family history of asthma and other chronic lower respiratory diseases: Secondary | ICD-10-CM | POA: Diagnosis not present

## 2023-11-11 DIAGNOSIS — Z9884 Bariatric surgery status: Secondary | ICD-10-CM

## 2023-11-11 DIAGNOSIS — I4891 Unspecified atrial fibrillation: Secondary | ICD-10-CM | POA: Diagnosis not present

## 2023-11-11 DIAGNOSIS — Z823 Family history of stroke: Secondary | ICD-10-CM

## 2023-11-11 DIAGNOSIS — M4802 Spinal stenosis, cervical region: Secondary | ICD-10-CM | POA: Diagnosis present

## 2023-11-11 DIAGNOSIS — Z860100 Personal history of colon polyps, unspecified: Secondary | ICD-10-CM

## 2023-11-11 DIAGNOSIS — Z9889 Other specified postprocedural states: Principal | ICD-10-CM

## 2023-11-11 DIAGNOSIS — Z800 Family history of malignant neoplasm of digestive organs: Secondary | ICD-10-CM

## 2023-11-11 DIAGNOSIS — Z818 Family history of other mental and behavioral disorders: Secondary | ICD-10-CM

## 2023-11-11 HISTORY — DX: Other specified postprocedural states: Z98.890

## 2023-11-11 HISTORY — PX: ANTERIOR CERVICAL DECOMP/DISCECTOMY FUSION: SHX1161

## 2023-11-11 HISTORY — DX: Other specified postprocedural states: R11.2

## 2023-11-11 HISTORY — DX: Failed or difficult intubation, initial encounter: T88.4XXA

## 2023-11-11 LAB — GLUCOSE, CAPILLARY
Glucose-Capillary: 146 mg/dL — ABNORMAL HIGH (ref 70–99)
Glucose-Capillary: 157 mg/dL — ABNORMAL HIGH (ref 70–99)

## 2023-11-11 SURGERY — ANTERIOR CERVICAL DECOMPRESSION/DISCECTOMY FUSION 1 LEVEL/HARDWARE REMOVAL
Anesthesia: General

## 2023-11-11 MED ORDER — CHLORHEXIDINE GLUCONATE CLOTH 2 % EX PADS: 6.0000 | MEDICATED_PAD | Freq: Once | CUTANEOUS | Status: DC

## 2023-11-11 MED ORDER — ORAL CARE MOUTH RINSE: 15.0000 mL | Freq: Once | OROMUCOSAL | Status: AC

## 2023-11-11 MED ORDER — ACETAMINOPHEN 650 MG RE SUPP: 650.0000 mg | RECTAL | Status: DC | PRN

## 2023-11-11 MED ORDER — MIDAZOLAM HCL 2 MG/2ML IJ SOLN
INTRAMUSCULAR | Status: AC
  Filled 2023-11-11: qty 2

## 2023-11-11 MED ORDER — DEXAMETHASONE SODIUM PHOSPHATE 10 MG/ML IJ SOLN
INTRAMUSCULAR | Status: DC | PRN
  Administered 2023-11-11: 10 mg via INTRAVENOUS

## 2023-11-11 MED ORDER — ROCURONIUM BROMIDE 10 MG/ML (PF) SYRINGE
PREFILLED_SYRINGE | INTRAVENOUS | Status: AC
  Filled 2023-11-11: qty 10

## 2023-11-11 MED ORDER — THROMBIN 5000 UNITS EX KIT
PACK | CUTANEOUS | Status: AC
  Filled 2023-11-11: qty 1

## 2023-11-11 MED ORDER — LIDOCAINE 2% (20 MG/ML) 5 ML SYRINGE
INTRAMUSCULAR | Status: DC | PRN
  Administered 2023-11-11: 60 mg via INTRAVENOUS

## 2023-11-11 MED ORDER — LACTATED RINGERS IV SOLN: INTRAVENOUS | Status: DC

## 2023-11-11 MED ORDER — GLYCOPYRROLATE 0.2 MG/ML IJ SOLN
INTRAMUSCULAR | Status: DC | PRN
  Administered 2023-11-11: .2 mg via INTRAVENOUS

## 2023-11-11 MED ORDER — ANASTROZOLE 1 MG PO TABS
1.0000 mg | ORAL_TABLET | Freq: Every day | ORAL | Status: DC
  Administered 2023-11-11 – 2023-11-13 (×3): 1 mg via ORAL
  Filled 2023-11-11 (×3): qty 1

## 2023-11-11 MED ORDER — LIDOCAINE 2% (20 MG/ML) 5 ML SYRINGE
INTRAMUSCULAR | Status: AC
  Filled 2023-11-11: qty 5

## 2023-11-11 MED ORDER — OXYCODONE HCL 5 MG/5ML PO SOLN: 5.0000 mg | Freq: Once | ORAL | Status: DC | PRN

## 2023-11-11 MED ORDER — VANCOMYCIN HCL IN DEXTROSE 1-5 GM/200ML-% IV SOLN
1000.0000 mg | INTRAVENOUS | Status: DC
  Administered 2023-11-12: 1000 mg via INTRAVENOUS
  Filled 2023-11-11 (×2): qty 200

## 2023-11-11 MED ORDER — OXYCODONE HCL 5 MG PO TABS
10.0000 mg | ORAL_TABLET | ORAL | Status: DC | PRN
  Administered 2023-11-11: 10 mg via ORAL
  Filled 2023-11-11: qty 2

## 2023-11-11 MED ORDER — BUPIVACAINE-EPINEPHRINE 0.5% -1:200000 IJ SOLN
INTRAMUSCULAR | Status: DC | PRN
Start: 2023-11-11 — End: 2023-11-11
  Administered 2023-11-11: 10 mL

## 2023-11-11 MED ORDER — ONDANSETRON HCL 4 MG/2ML IJ SOLN
4.0000 mg | Freq: Once | INTRAMUSCULAR | Status: DC | PRN
Start: 2023-11-11 — End: 2023-11-11

## 2023-11-11 MED ORDER — ZOLPIDEM TARTRATE 5 MG PO TABS
5.0000 mg | ORAL_TABLET | Freq: Every evening | ORAL | Status: DC | PRN
Start: 2023-11-11 — End: 2023-11-13
  Administered 2023-11-12: 5 mg via ORAL
  Filled 2023-11-11: qty 1

## 2023-11-11 MED ORDER — GABAPENTIN 300 MG PO CAPS
300.0000 mg | ORAL_CAPSULE | Freq: Every day | ORAL | Status: DC | PRN
  Administered 2023-11-11 – 2023-11-12 (×2): 300 mg via ORAL
  Filled 2023-11-11 (×2): qty 1

## 2023-11-11 MED ORDER — GLYCOPYRROLATE PF 0.2 MG/ML IJ SOSY
PREFILLED_SYRINGE | INTRAMUSCULAR | Status: AC
  Filled 2023-11-11: qty 1

## 2023-11-11 MED ORDER — BISACODYL 10 MG RE SUPP: 10.0000 mg | Freq: Every day | RECTAL | Status: DC | PRN

## 2023-11-11 MED ORDER — ROCURONIUM BROMIDE 10 MG/ML (PF) SYRINGE
PREFILLED_SYRINGE | INTRAVENOUS | Status: DC | PRN
  Administered 2023-11-11: 50 mg via INTRAVENOUS
  Administered 2023-11-11 (×3): 20 mg via INTRAVENOUS
  Administered 2023-11-11: 10 mg via INTRAVENOUS

## 2023-11-11 MED ORDER — FERROUS SULFATE 325 (65 FE) MG PO TABS
325.0000 mg | ORAL_TABLET | ORAL | Status: DC
  Administered 2023-11-11 – 2023-11-13 (×2): 325 mg via ORAL
  Filled 2023-11-11 (×2): qty 1

## 2023-11-11 MED ORDER — DEXAMETHASONE 4 MG PO TABS
4.0000 mg | ORAL_TABLET | Freq: Four times a day (QID) | ORAL | Status: AC
  Administered 2023-11-11 (×2): 4 mg via ORAL
  Filled 2023-11-11 (×2): qty 1

## 2023-11-11 MED ORDER — PHENOL 1.4 % MT LIQD
1.0000 | OROMUCOSAL | Status: DC | PRN
Start: 2023-11-11 — End: 2023-11-13

## 2023-11-11 MED ORDER — ONDANSETRON HCL 4 MG/2ML IJ SOLN
INTRAMUSCULAR | Status: DC | PRN
  Administered 2023-11-11: 4 mg via INTRAVENOUS

## 2023-11-11 MED ORDER — MIDAZOLAM HCL 2 MG/2ML IJ SOLN
INTRAMUSCULAR | Status: DC | PRN
  Administered 2023-11-11: 2 mg via INTRAVENOUS

## 2023-11-11 MED ORDER — THROMBIN 5000 UNITS EX SOLR
OROMUCOSAL | Status: DC | PRN
  Administered 2023-11-11: 5 mL via TOPICAL

## 2023-11-11 MED ORDER — THROMBIN 20000 UNITS EX SOLR
CUTANEOUS | Status: AC
  Filled 2023-11-11: qty 20000

## 2023-11-11 MED ORDER — ONDANSETRON HCL 4 MG PO TABS: 4.0000 mg | ORAL_TABLET | Freq: Four times a day (QID) | ORAL | Status: DC | PRN

## 2023-11-11 MED ORDER — FENTANYL CITRATE (PF) 100 MCG/2ML IJ SOLN
INTRAMUSCULAR | Status: AC
  Filled 2023-11-11: qty 2

## 2023-11-11 MED ORDER — PHENYLEPHRINE 80 MCG/ML (10ML) SYRINGE FOR IV PUSH (FOR BLOOD PRESSURE SUPPORT)
PREFILLED_SYRINGE | INTRAVENOUS | Status: DC | PRN
  Administered 2023-11-11: 80 ug via INTRAVENOUS

## 2023-11-11 MED ORDER — ALUM & MAG HYDROXIDE-SIMETH 200-200-20 MG/5ML PO SUSP: 30.0000 mL | Freq: Four times a day (QID) | ORAL | Status: DC | PRN

## 2023-11-11 MED ORDER — ACETAMINOPHEN 325 MG PO TABS
650.0000 mg | ORAL_TABLET | ORAL | Status: DC | PRN
  Administered 2023-11-13: 650 mg via ORAL
  Filled 2023-11-11: qty 2

## 2023-11-11 MED ORDER — ACETAMINOPHEN 500 MG PO TABS
1000.0000 mg | ORAL_TABLET | Freq: Four times a day (QID) | ORAL | Status: AC
  Administered 2023-11-11 – 2023-11-12 (×4): 1000 mg via ORAL
  Filled 2023-11-11 (×4): qty 2

## 2023-11-11 MED ORDER — FENTANYL CITRATE (PF) 250 MCG/5ML IJ SOLN
INTRAMUSCULAR | Status: AC
  Filled 2023-11-11: qty 5

## 2023-11-11 MED ORDER — DEXAMETHASONE SODIUM PHOSPHATE 10 MG/ML IJ SOLN
INTRAMUSCULAR | Status: AC
  Filled 2023-11-11: qty 1

## 2023-11-11 MED ORDER — SUGAMMADEX SODIUM 200 MG/2ML IV SOLN
INTRAVENOUS | Status: DC | PRN
  Administered 2023-11-11: 200 mg via INTRAVENOUS

## 2023-11-11 MED ORDER — BUPIVACAINE-EPINEPHRINE (PF) 0.5% -1:200000 IJ SOLN
INTRAMUSCULAR | Status: AC
  Filled 2023-11-11: qty 30

## 2023-11-11 MED ORDER — VANCOMYCIN HCL IN DEXTROSE 1-5 GM/200ML-% IV SOLN
1000.0000 mg | INTRAVENOUS | Status: AC
  Administered 2023-11-11: 1000 mg via INTRAVENOUS
  Filled 2023-11-11: qty 200

## 2023-11-11 MED ORDER — FENTANYL CITRATE (PF) 250 MCG/5ML IJ SOLN
INTRAMUSCULAR | Status: DC | PRN
Start: 2023-11-11 — End: 2023-11-11
  Administered 2023-11-11: 100 ug via INTRAVENOUS

## 2023-11-11 MED ORDER — DEXAMETHASONE SODIUM PHOSPHATE 4 MG/ML IJ SOLN
4.0000 mg | Freq: Four times a day (QID) | INTRAMUSCULAR | Status: AC
  Filled 2023-11-11 (×2): qty 1

## 2023-11-11 MED ORDER — SUCCINYLCHOLINE CHLORIDE 200 MG/10ML IV SOSY
PREFILLED_SYRINGE | INTRAVENOUS | Status: AC
  Filled 2023-11-11: qty 10

## 2023-11-11 MED ORDER — PANTOPRAZOLE SODIUM 40 MG IV SOLR
40.0000 mg | Freq: Every day | INTRAVENOUS | Status: DC
  Administered 2023-11-11 – 2023-11-12 (×2): 40 mg via INTRAVENOUS
  Filled 2023-11-11 (×2): qty 10

## 2023-11-11 MED ORDER — OXYCODONE HCL 5 MG PO TABS: 5.0000 mg | ORAL_TABLET | Freq: Once | ORAL | Status: DC | PRN

## 2023-11-11 MED ORDER — PANTOPRAZOLE SODIUM 40 MG PO TBEC: 80.0000 mg | DELAYED_RELEASE_TABLET | Freq: Every day | ORAL | Status: DC

## 2023-11-11 MED ORDER — PROPOFOL 10 MG/ML IV BOLUS
INTRAVENOUS | Status: AC
  Filled 2023-11-11: qty 20

## 2023-11-11 MED ORDER — ONDANSETRON HCL 4 MG/2ML IJ SOLN
INTRAMUSCULAR | Status: AC
  Filled 2023-11-11: qty 2

## 2023-11-11 MED ORDER — ONDANSETRON HCL 4 MG/2ML IJ SOLN: 4.0000 mg | Freq: Four times a day (QID) | INTRAMUSCULAR | Status: DC | PRN

## 2023-11-11 MED ORDER — CITALOPRAM HYDROBROMIDE 20 MG PO TABS
40.0000 mg | ORAL_TABLET | Freq: Every day | ORAL | Status: DC
  Administered 2023-11-11 – 2023-11-13 (×3): 40 mg via ORAL
  Filled 2023-11-11 (×3): qty 2

## 2023-11-11 MED ORDER — CHLORHEXIDINE GLUCONATE 0.12 % MT SOLN
15.0000 mL | Freq: Once | OROMUCOSAL | Status: AC
  Administered 2023-11-11: 15 mL via OROMUCOSAL
  Filled 2023-11-11: qty 15

## 2023-11-11 MED ORDER — 0.9 % SODIUM CHLORIDE (POUR BTL) OPTIME
TOPICAL | Status: DC | PRN
  Administered 2023-11-11: 1000 mL

## 2023-11-11 MED ORDER — OXYCODONE HCL 5 MG PO TABS
5.0000 mg | ORAL_TABLET | ORAL | Status: DC | PRN
  Administered 2023-11-11 – 2023-11-12 (×3): 5 mg via ORAL
  Filled 2023-11-11 (×3): qty 1

## 2023-11-11 MED ORDER — MORPHINE SULFATE (PF) 2 MG/ML IV SOLN
2.0000 mg | INTRAVENOUS | Status: DC | PRN
  Administered 2023-11-11: 2 mg via INTRAVENOUS
  Filled 2023-11-11: qty 1

## 2023-11-11 MED ORDER — MEPERIDINE HCL 25 MG/ML IJ SOLN: 6.2500 mg | INTRAMUSCULAR | Status: DC | PRN

## 2023-11-11 MED ORDER — CYCLOBENZAPRINE HCL 10 MG PO TABS: 10.0000 mg | ORAL_TABLET | Freq: Three times a day (TID) | ORAL | Status: DC | PRN

## 2023-11-11 MED ORDER — PROPRANOLOL HCL ER 60 MG PO CP24
60.0000 mg | ORAL_CAPSULE | Freq: Every day | ORAL | Status: DC
  Administered 2023-11-11 – 2023-11-13 (×3): 60 mg via ORAL
  Filled 2023-11-11 (×3): qty 1

## 2023-11-11 MED ORDER — THROMBIN 20000 UNITS EX SOLR
CUTANEOUS | Status: DC | PRN
  Administered 2023-11-11: 20 mL via TOPICAL

## 2023-11-11 MED ORDER — MENTHOL 3 MG MT LOZG: 1.0000 | LOZENGE | OROMUCOSAL | Status: DC | PRN

## 2023-11-11 MED ORDER — BACITRACIN ZINC 500 UNIT/GM EX OINT
TOPICAL_OINTMENT | CUTANEOUS | Status: AC
  Filled 2023-11-11: qty 28.35

## 2023-11-11 MED ORDER — FENTANYL CITRATE (PF) 100 MCG/2ML IJ SOLN
25.0000 ug | INTRAMUSCULAR | Status: DC | PRN
  Administered 2023-11-11: 50 ug via INTRAVENOUS

## 2023-11-11 MED ORDER — PROPOFOL 10 MG/ML IV BOLUS
INTRAVENOUS | Status: DC | PRN
  Administered 2023-11-11: 15 ug/kg/min via INTRAVENOUS
  Administered 2023-11-11: 130 mg via INTRAVENOUS

## 2023-11-11 MED ORDER — DOCUSATE SODIUM 100 MG PO CAPS
100.0000 mg | ORAL_CAPSULE | Freq: Two times a day (BID) | ORAL | Status: DC
  Administered 2023-11-11 – 2023-11-13 (×4): 100 mg via ORAL
  Filled 2023-11-11 (×4): qty 1

## 2023-11-11 SURGICAL SUPPLY — 53 items
BAG COUNTER SPONGE SURGICOUNT (BAG) ×1 IMPLANT
BAND RUBBER #18 3X1/16 STRL (MISCELLANEOUS) ×2 IMPLANT
BENZOIN TINCTURE PRP APPL 2/3 (GAUZE/BANDAGES/DRESSINGS) ×2 IMPLANT
BIT DRILL NEURO 2X3.1 SFT TUCH (MISCELLANEOUS) ×1 IMPLANT
BLADE SURG 15 STRL LF DISP TIS (BLADE) ×1 IMPLANT
BLADE ULTRA TIP 2M (BLADE) ×1 IMPLANT
BUR BARREL STRAIGHT FLUTE 4.0 (BURR) ×1 IMPLANT
BUR MATCHSTICK NEURO 3.0 LAGG (BURR) ×1 IMPLANT
CANISTER SUCTION 3000ML PPV (SUCTIONS) ×1 IMPLANT
CHIPS BONE CANC-ACS 11X14X8 6D (Bone Implant) IMPLANT
COVER MAYO STAND STRL (DRAPES) ×1 IMPLANT
DERMABOND ADVANCED .7 DNX12 (GAUZE/BANDAGES/DRESSINGS) IMPLANT
DRAPE LAPAROTOMY 100X72 PEDS (DRAPES) ×1 IMPLANT
DRAPE MICROSCOPE SLANT 54X150 (MISCELLANEOUS) IMPLANT
DRAPE SURG 17X23 STRL (DRAPES) ×2 IMPLANT
DRSG OPSITE POSTOP 3X4 (GAUZE/BANDAGES/DRESSINGS) ×1 IMPLANT
ELECTRODE REM PT RTRN 9FT ADLT (ELECTROSURGICAL) ×1 IMPLANT
EVACUATOR SILICONE 100CC (DRAIN) IMPLANT
GAUZE 4X4 16PLY ~~LOC~~+RFID DBL (SPONGE) IMPLANT
GLOVE BIO SURGEON STRL SZ 6.5 (GLOVE) ×1 IMPLANT
GLOVE BIO SURGEON STRL SZ8 (GLOVE) ×1 IMPLANT
GLOVE BIO SURGEON STRL SZ8.5 (GLOVE) ×1 IMPLANT
GLOVE BIOGEL PI IND STRL 6.5 (GLOVE) ×1 IMPLANT
GLOVE EXAM NITRILE XL STR (GLOVE) IMPLANT
GOWN STRL REUS W/ TWL LRG LVL3 (GOWN DISPOSABLE) ×1 IMPLANT
GOWN STRL REUS W/ TWL XL LVL3 (GOWN DISPOSABLE) ×1 IMPLANT
HEMOSTAT POWDER KIT SURGIFOAM (HEMOSTASIS) ×1 IMPLANT
KIT BASIN OR (CUSTOM PROCEDURE TRAY) ×1 IMPLANT
KIT TURNOVER KIT B (KITS) ×1 IMPLANT
MARKER SKIN DUAL TIP RULER LAB (MISCELLANEOUS) ×1 IMPLANT
NDL HYPO 22X1.5 SAFETY MO (MISCELLANEOUS) ×1 IMPLANT
NDL SPNL 18GX3.5 QUINCKE PK (NEEDLE) ×1 IMPLANT
NEEDLE HYPO 22X1.5 SAFETY MO (MISCELLANEOUS) ×1 IMPLANT
NEEDLE SPNL 18GX3.5 QUINCKE PK (NEEDLE) ×1 IMPLANT
NS IRRIG 1000ML POUR BTL (IV SOLUTION) ×1 IMPLANT
PACK LAMINECTOMY NEURO (CUSTOM PROCEDURE TRAY) ×1 IMPLANT
PATTIES SURGICAL .5 X.5 (GAUZE/BANDAGES/DRESSINGS) ×1 IMPLANT
PATTIES SURGICAL 1X1 (DISPOSABLE) ×1 IMPLANT
PIN DISTRACTION 14MM (PIN) ×2 IMPLANT
PLATE 1 LEVEL 16 (Plate) IMPLANT
SCREW BONE FIXED SD 4.0X14 (Screw) IMPLANT
SCREW BONE FIXED SD 4.5XX14 (Screw) IMPLANT
SPIKE FLUID TRANSFER (MISCELLANEOUS) ×1 IMPLANT
SPONGE INTESTINAL PEANUT (DISPOSABLE) ×2 IMPLANT
SPONGE SURGIFOAM ABS GEL 100 (HEMOSTASIS) IMPLANT
SPONGE SURGIFOAM ABS GEL SZ50 (HEMOSTASIS) IMPLANT
STRIP CLOSURE SKIN 1/2X4 (GAUZE/BANDAGES/DRESSINGS) ×1 IMPLANT
SUT VIC AB 0 CT1 27XBRD ANTBC (SUTURE) ×1 IMPLANT
SUT VIC AB 3-0 SH 8-18 (SUTURE) ×1 IMPLANT
SYR 30ML SLIP (SYRINGE) ×1 IMPLANT
TOWEL GREEN STERILE (TOWEL DISPOSABLE) ×1 IMPLANT
TOWEL GREEN STERILE FF (TOWEL DISPOSABLE) ×1 IMPLANT
WATER STERILE IRR 1000ML POUR (IV SOLUTION) ×1 IMPLANT

## 2023-11-11 NOTE — H&P (Signed)
**Note De-Identified Snethen Obfuscation** Subjective: The patient is a 73 year old white female on whom Dr. Lynder Sanger performed a C4-5 and C6-7 anterior cervicectomy fusion and plating on years ago.  She has developed recent neck and arm pain consistent with a cervical radiculopathy.  She failed medical management and was worked up with a cervical myelo CT which demonstrated spondylosis and stenosis at C5-6.  I discussed the various treatment options with her.  She has decided proceed with surgery.  Past Medical History:  Diagnosis Date   Allergy     Anemia    Atrial flutter (HCC)    typical appearing   Basal cell carcinoma     nose - 1985   Difficult intubation    GERD (gastroesophageal reflux disease)    GI bleed    H/O cardiac radiofrequency ablation 2015   H/O gastric bypass 2005   History of diabetes mellitus 11/13/2015   Hypertension    Hypokalemia    Osteoporosis 2000   Paroxysmal atrial fibrillation (HCC)    Personal history of colonic polyps - sessile serrated 11/23/2013   PONV (postoperative nausea and vomiting)     Past Surgical History:  Procedure Laterality Date   APPENDECTOMY     ATRIAL FLUTTER ABLATION N/A 09/05/2014   Procedure: ATRIAL FLUTTER ABLATION;  Surgeon: Jolly Needle, MD;  Location: [facility]ATH [facility];  Service: Cardiovascular;  Laterality: N/A;   BIOPSY  06/23/2023   Procedure: BIOPSY;  Surgeon: Normie Becton., MD;  Location: [facility];  Service: Gastroenterology;;   BREAST BIOPSY Right 10/21/2023   US  RT BREAST BX W LOC DEV 1ST LESION IMG BX SPEC US  GUIDE 10/21/2023 [facility]   BREAST BIOPSY Right 10/21/2023   US  RT BREAST BX W LOC DEV EA ADD LESION IMG BX SPEC US  GUIDE 10/21/2023 [facility]   BREAST EXCISIONAL BIOPSY Right 01/2016   CARPAL TUNNEL RELEASE Bilateral    CHOLECYSTECTOMY     ENTEROSCOPY N/A 06/23/2023   Procedure: ENTEROSCOPY;  Surgeon: Normie Becton., MD;  Location: [facility];  Service: Gastroenterology;  Laterality: N/A;   FOOT SURGERY Bilateral 2012    shorten bones in both feet   GASTRIC BYPASS     removal of ovary Right 1987   SPINE SURGERY  2000/2012   anterior cervical fusion with plating   SUBMUCOSAL TATTOO INJECTION  06/23/2023   Procedure: SUBMUCOSAL TATTOO INJECTION;  Surgeon: Normie Becton., MD;  Location: [facility];  Service: Gastroenterology;;   TOTAL ABDOMINAL HYSTERECTOMY  1999   WRIST SURGERY Right 2018   Dr. Primus Brookes -ganglion excision    Allergies  Allergen Reactions   Amoxicillin -Pot Clavulanate Nausea Only    Severe nausea. Couldn't keep medication down  Severe nausea. Couldn't keep medication down   Clarithromycin Hives   Nitrofurantoin Other (See Comments) and Rash    Unknown Palms and hand itching, turned bright red per pt   Sulfonamide Derivatives Other (See Comments)    Abdominal cramping, nausea   Azithromycin  Other (See Comments)    Z-pack, doesn't do anything Z-pack, doesn't do anything Z-pack, doesn't do anything Z-pack, doesn't do anything    Sulfa Antibiotics Nausea And Vomiting and Other (See Comments)    Unknown    Social History   Tobacco Use   Smoking status: Never   Smokeless tobacco: Never  Substance Use Topics   Alcohol use: No    Family History  Problem Relation Age of Onset   Diabetes Mother    Heart disease Mother    Arthritis Mother    Hearing **Note De-Identified Berthelot Obfuscation** loss Mother    Hyperlipidemia Mother    Hypertension Mother    Kidney disease Mother    Learning disabilities Mother    Miscarriages / [place] Mother    Heart disease Father    Hypertension Father    Alcohol abuse Father    Hyperlipidemia Father    Scleroderma Sister    Early death Sister        schlederma   Miscarriages / Stillbirths Sister    Emphysema Sister    Diabetes Sister    Hyperlipidemia Sister    Hypertension Sister    Mental illness Sister    Miscarriages / [place] Sister    Stroke Sister    Diabetes Sister    Heart disease Brother    Cancer Brother        lung   Stroke Brother    Lung  cancer Brother        smoker   Diabetes Brother    Emphysema Maternal Grandmother    Cancer Maternal Grandmother        throat and lung(smoker)   Esophageal cancer Maternal Grandmother    Colon cancer Maternal Grandfather    Pancreatic cancer Neg Hx    Rectal cancer Neg Hx    Stomach cancer Neg Hx    Prior to Admission medications   Medication Sig Start Date End Date Taking? Authorizing Provider  acetaminophen  (TYLENOL ) 500 MG tablet Take 1,000 mg by mouth daily as needed (pain.).   Yes [provider]  anastrozole  (ARIMIDEX ) 1 MG tablet Take 1 tablet (1 mg total) by mouth daily. 10/28/23  Yes Gudena, Vinay, MD  citalopram  (CELEXA ) 40 MG tablet Take 1 tablet (40 mg total) by mouth daily. Patient taking differently: Take 40 mg by mouth every evening. 08/20/23  Yes Rodney Clamp, MD  Cyanocobalamin  (VITAMIN B 12 PO) Take 1 tablet by mouth every evening.   Yes [provider]  cyclobenzaprine  (FLEXERIL ) 10 MG tablet Take 10 mg by mouth 3 (three) times daily as needed for muscle spasms.   Yes [provider]  ferrous sulfate 325 (65 FE) MG tablet Take 325 mg by mouth 3 (three) times a week.   Yes [provider]  gabapentin  (NEURONTIN ) 100 MG capsule Take 100 mg by mouth daily as needed (pain). 02/15/19  Yes [provider]  gabapentin  (NEURONTIN ) 300 MG capsule Take 300 mg by mouth daily as needed (pain).   Yes [provider]  Multiple Vitamins-Minerals (MULTIVITAMIN GUMMIES ADULT PO) Take 1 tablet by mouth every evening.   Yes [provider]  omeprazole  (PRILOSEC) 40 MG capsule Take 1 capsule (40 mg total) by mouth daily before breakfast. 30 mins before breakfast Patient taking differently: Take 40 mg by mouth at bedtime. 30 mins before breakfast 09/17/23  Yes Kenney Peacemaker, MD  propranolol  ER (INDERAL  LA) 60 MG 24 hr capsule Take 1 capsule (60 mg total) by mouth daily. Patient taking differently: Take 60 mg by mouth every  evening. 08/20/23  Yes Rodney Clamp, MD  Vitamin D , Ergocalciferol , (DRISDOL ) 1.25 MG (50000 UNIT) CAPS capsule Take 50000IU once weekly. Patient taking differently: Take 50,000 Units by mouth every Monday. 08/20/23  Yes Rodney Clamp, MD  estradiol  (ESTRACE ) 0.1 MG/GM vaginal cream Place 1 Applicatorful vaginally at bedtime. Patient taking differently: Place 1 Applicatorful vaginally at bedtime as needed (irriation/dryness). 08/20/23   Rodney Clamp, MD     Review of Systems  Positive ROS: As above  All other **Note De-Identified Fludd Obfuscation** systems have been reviewed and were otherwise negative with the exception of those mentioned in the HPI and as above.  Objective: Vital signs in last 24 hours: Temp:  [97.9 F (36.6 C)] 97.9 F (36.6 C) (05/28 1043) Pulse Rate:  [82] 82 (05/28 1043) Resp:  [18] 18 (05/28 1043) BP: (141)/(87) 141/87 (05/28 1043) SpO2:  [95 %] 95 % (05/28 1043) Weight:  [84.8 kg] 84.8 kg (05/28 1043) Estimated body mass index is 32.08 kg/m as calculated from the following:   Height as of this encounter: 5\' 4"  (1.626 m).   Weight as of this encounter: 84.8 kg.   General Appearance: Alert Head: Normocephalic, without obvious abnormality, atraumatic Eyes: PERRL, conjunctiva/corneas clear, EOM's intact,    Ears: Normal  Throat: Normal  Neck: Her right anterior cervical incision is well-healed.  She has limited cervical range of motion. Back: unremarkable Lungs: Clear to auscultation bilaterally, respirations unlabored Heart: Regular rate and rhythm, no murmur, rub or gallop Abdomen: Soft, non-tender Extremities: Extremities normal, atraumatic, no cyanosis or edema Skin: unremarkable  NEUROLOGIC:   Mental status: alert and oriented,Motor Exam - grossly normal Sensory Exam - grossly normal Reflexes:  Coordination - grossly normal Gait - grossly normal Balance - grossly normal Cranial Nerves: I: smell Not tested  II: visual acuity  OS: Normal  OD: Normal   II: visual fields Full to  confrontation  II: pupils Equal, round, reactive to light  III,VII: ptosis None  III,IV,VI: extraocular muscles  Full ROM  V: mastication Normal  V: facial light touch sensation  Normal  V,VII: corneal reflex  Present  VII: facial muscle function - upper  Normal  VII: facial muscle function - lower Normal  VIII: hearing Not tested  IX: soft palate elevation  Normal  IX,X: gag reflex Present  XI: trapezius strength  5/5  XI: sternocleidomastoid strength 5/5  XI: neck flexion strength  5/5  XII: tongue strength  Normal    Data Review Lab Results  Component Value Date   WBC 5.8 10/28/2023   HGB 12.6 10/28/2023   HCT 38.6 10/28/2023   MCV 88.7 10/28/2023   PLT 211 10/28/2023   Lab Results  Component Value Date   NA 141 10/28/2023   K 3.6 10/28/2023   CL 108 10/28/2023   CO2 29 10/28/2023   BUN 18 10/28/2023   CREATININE 0.58 10/28/2023   GLUCOSE 101 (H) 10/28/2023   No results found for: "INR", "PROTIME"  Assessment/Plan: Cervical spondylosis, cervicalgia, cervical radiculopathy: I have discussed the situation with the patient.  I reviewed her myelo CT with her and pointed out the abnormalities.  We have discussed the various treatment options including surgery.  I have described the surgical treatment option of a C5-6 anterior cervicectomy fusion and plating with possible removal of her old plate.  I have shown her surgical models.  I given her a surgical pamphlet.  We have discussed the risk, benefits, alternatives, expected postoperative course, and likelihood of achieving our goals with surgery.  I have answered all her questions.  She has decided proceed with surgery.   Elder Greening 11/11/2023 1:01 PM

## 2023-11-11 NOTE — Transfer of Care (Signed)
**Note De-Identified Borys Obfuscation** Immediate Anesthesia Transfer of Care Note  Patient: Felicia Schultz  Procedure(s) Performed: ANTERIOR CERVICAL DECOMPRESSION/DISCECTOMY FUSION CERVICAL FIVE-SIX Judge Notice REMOVAL  Patient Location: PACU  Anesthesia Type:General  Level of Consciousness: awake, alert , and oriented  Airway & Oxygen Therapy: Patient Spontanous Breathing  Post-op Assessment: Report given to RN and Post -op Vital signs reviewed and stable  Post vital signs: Reviewed and stable  Last Vitals:  Vitals Value Taken Time  BP 157/81 11/11/23 1641  Temp    Pulse 86 11/11/23 1644  Resp 16 11/11/23 1644  SpO2 95 % 11/11/23 1644  Vitals shown include unfiled device data.  Last Pain:  Vitals:   11/11/23 1058  TempSrc:   PainSc: 6       Patients Stated Pain Goal: 0 (11/11/23 1058)  Complications: No notable events documented.

## 2023-11-11 NOTE — Anesthesia Procedure Notes (Signed)
**Note De-Identified Kosh Obfuscation** Procedure Name: Intubation Date/Time: 11/11/2023 1:56 PM  Performed by: Ancel Baltimore, RNPre-anesthesia Checklist: Patient identified, Emergency Drugs available, Suction available and Patient being monitored Patient Re-evaluated:Patient Re-evaluated prior to induction Oxygen Delivery Method: Circle System Utilized Preoxygenation: Pre-oxygenation with 100% oxygen Induction Type: IV induction Ventilation: Mask ventilation without difficulty Laryngoscope Size: 3 and Glidescope Grade View: Grade I Tube type: Oral Tube size: 7.0 mm Number of attempts: 1 Airway Equipment and Method: Stylet and Oral airway Placement Confirmation: ETT inserted through vocal cords under direct vision, positive ETCO2 and breath sounds checked- equal and bilateral Secured at: 22 cm Tube secured with: Tape Dental Injury: Teeth and Oropharynx as per pre-operative assessment  Comments: Preformed by SRNA, MDA and CRNA present

## 2023-11-11 NOTE — Progress Notes (Signed)
**Note De-Identified Liford Obfuscation** Pharmacy Antibiotic Note  Felicia Schultz is a 73 y.o. female admitted on 11/11/2023 with surgical prophylaxis.  Pharmacy has been consulted for vancomycin dosing.  Patient has drain in vertebral space.  5/28 Vancomycin 1000mg  Q 24 hr with Est AUC: 460 Scr used: 0.8 mg/dL; Vd coeff: 0.5 L/kg  Plan: Vancomcyin 1000mg  q24hr until drain is out    Height: 5\' 4"  (162.6 cm) Weight: 84.8 kg (186 lb 14.4 oz) IBW/kg (Calculated) : 54.7  Temp (24hrs), Avg:97.8 F (36.6 C), Min:97.7 F (36.5 C), Max:97.9 F (36.6 C)  No results for input(s): "WBC", "CREATININE", "LATICACIDVEN", "VANCOTROUGH", "VANCOPEAK", "VANCORANDOM", "GENTTROUGH", "GENTPEAK", "GENTRANDOM", "TOBRATROUGH", "TOBRAPEAK", "TOBRARND", "AMIKACINPEAK", "AMIKACINTROU", "AMIKACIN" in the last 168 hours.  Estimated Creatinine Clearance: 68.9 mL/min (by C-G formula based on SCr of 0.58 mg/dL).    Allergies  Allergen Reactions   Amoxicillin -Pot Clavulanate Nausea Only    Severe nausea. Couldn't keep medication down  Severe nausea. Couldn't keep medication down   Clarithromycin Hives   Nitrofurantoin Other (See Comments) and Rash    Unknown Palms and hand itching, turned bright red per pt   Sulfonamide Derivatives Other (See Comments)    Abdominal cramping, nausea   Azithromycin  Other (See Comments)    Z-pack, doesn't do anything Z-pack, doesn't do anything Z-pack, doesn't do anything Z-pack, doesn't do anything    Sulfa Antibiotics Nausea And Vomiting and Other (See Comments)    Unknown    Antimicrobials this admission: vanc 5/28 >>    Dose adjustments this admission: N/a  Microbiology results   Thank you for allowing pharmacy to be a part of this patient's care.  Dorene Gang, PharmD, BCPS, BCCP Clinical Pharmacist  Please check AMION for all [facility] phone numbers After 10:00 PM, call Main Pharmacy [phone]

## 2023-11-11 NOTE — Op Note (Signed)
**Note De-Identified Simkin Obfuscation** Brief history: The patient is a 73 year old white female whose had a previous anterior cervical discectomy fusion and plating at C4-5 and C6-7 by another physician.  He developed persistent and worsening neck and arm pain.  She has failed medical management.  She was worked up with a cervical mild CT which demonstrated severe degeneration, spondylosis and kyphosis at C5-6.  I discussed the various treatment options with her.  She has decided proceed with surgery.    Preoperative diagnosis:Cervicalgia, cervical pseudoarthrosis, cervical spondylosis, cervical spondylosis  Postoperative diagnosis: Cervicalgia, cervical pseudoarthrosis, cervical spondylosis, cervical spondylosis  Procedure: C5-6 anterior cervical discectomy/decompression; C5-6 interbody arthrodesis with structural allograft interbody prosthesis: anterior cervical plating from C5-6 with Zimmer titanium plate; exploration of cervical fusion/removal of cervical hardware at C4-5  Surgeon: Dr. Pleasant Brilliant  Asst.: Marlana Silvan, NP  Anesthesia: Gen. endotracheal  Estimated blood loss: 150 cc  Drains: 10 mm flat Hemovac drain in the vertebral space  Complications: Right vertebral artery injury  Description of procedure: The patient was brought to the operating room by the anesthesia team. General endotracheal anesthesia was induced. A roll was placed under the patient's shoulders to keep the neck in the neutral position. The patient's anterior cervical region was then prepared with Betadine scrub and Betadine solution. Sterile drapes were applied.  The area to be incised was then injected with Marcaine  with epinephrine solution. I then used a scalpel to make a transverse incision in the patient's left anterior neck, incising through the patient's previous surgical scar. I used the Metzenbaum scissors to dissect through the scar tissue and to divide the platysmal muscle and then to dissect medial to the sternocleidomastoid muscle, jugular  vein, and carotid artery. I carefully dissected down towards the anterior cervical spine identifying the esophagus and retracting it medially. Then using Kitner swabs to clear soft tissue from the anterior cervical spine and to expose the old plate at the Z6-1.  We explored the fusion by unlocking the screws from the old plate and then removed the old plate.  I could not identify a clear arthrodesis at C4-5.  I then used electrocautery to detach the medial border of the longus colli muscle bilaterally from the C5-6 intervertebral disc spaces. I then inserted the Caspar self-retaining retractor underneath the longus colli muscle bilaterally to provide exposure.  We then incised the intervertebral disc at C5-6. We then performed a partial intervertebral discectomy with a pituitary forceps and the Karlin curettes. I then inserted distraction screws into the vertebral bodies at C5-6. We then distracted the interspace. We then used the high-speed drill to decorticate the vertebral endplates at C5-6 , to drill away the remainder of the intervertebral disc, to drill away some posterior spondylosis, and to thin out the posterior longitudinal ligament. I then incised ligament with the arachnoid knife. We then removed the ligament with a Kerrison punches undercutting the vertebral endplates and decompressing the thecal sac. We then performed foraminotomies about the bilateral C6 nerve roots.  While was using electrocautery to expose spondylosis laterally on the right at C5-6 we encountered arterial bleeding which was fairly easily controlled with Gelfoam, which I left in for the remainder of the case.   We now turned our to attention to the interbody fusion. We used the trial spacers to determine the appropriate size for the interbody graft. We then inserted the interbody graft into the distracted interspace at C5-6. We then removed the distraction screws. There was a good snug fit of the prosthesis in the **Note De-Identified Petrakis Obfuscation** interspace.   Having completed the fusion we now turned attention to the anterior spinal instrumentation. We used the high-speed drill to drill away some anterior spondylosis at the disc spaces so that the plate lay down flat. We selected the appropriate length titanium anterior cervical plate. We laid it along the anterior aspect of the vertebral bodies from C5-6.  We used the old screw holes at C5 we then drilled 14 mm holes at the C6. We then secured the plate to the vertebral bodies by placing two 14 mm self-tapping screws at C5 and C6. We then obtained intraoperative radiograph. The demonstrating good position of the instrumentation. We therefore secured the screws the plate the locking each cam. This completed the instrumentation.  We then obtained hemostasis using bipolar electrocautery.  We did not encounter any more bleeding laterally at C5-6.  We irrigated the wound out with bacitracin solution. We then removed the retractor. We inspected the esophagus for any damage. There was none apparent.  We placed a 10 mm flat Jackson-Pratt drain in the prevertebral space and tunneled it out through a separate stab wound.  We then reapproximated patient's platysmal muscle with interrupted 3-0 Vicryl suture. We then reapproximated the subcutaneous tissue with interrupted 3-0 Vicryl suture. The skin was reapproximated with Dermabond.  A sterile dressing was applied. The drapes were removed. All sponge instrument and needle counts were reportedly correct at the end of this case.

## 2023-11-11 NOTE — Plan of Care (Signed)

## 2023-11-11 NOTE — Progress Notes (Signed)
**Note De-Identified Haddox Obfuscation** Subjective:  The patient is somnolent but easily arousable.  She is in no apparent distress.  Objective: Vital signs in last 24 hours: Temp:  [97.7 F (36.5 C)-97.9 F (36.6 C)] 97.7 F (36.5 C) (05/28 1641) Pulse Rate:  [79-82] 79 (05/28 1645) Resp:  [18-22] 22 (05/28 1645) BP: (141-157)/(76-87) 149/76 (05/28 1645) SpO2:  [93 %-99 %] 93 % (05/28 1645) Weight:  [84.8 kg] 84.8 kg (05/28 1043) Estimated body mass index is 32.08 kg/m as calculated from the following:   Height as of this encounter: 5\' 4"  (1.626 m).   Weight as of this encounter: 84.8 kg.   Intake/Output from previous day: No intake/output data recorded. Intake/Output this shift: Total I/O In: 800 [I.V.:800] Out: 200 [Blood:200]  Physical exam the patient is somnolent but he is arousable.  She is moving all 4 extremities well.  Her Dermabond is intact.  There is no hematoma or shift.  Lab Results: No results for input(s): "WBC", "HGB", "HCT", "PLT" in the last 72 hours. BMET No results for input(s): "NA", "K", "CL", "CO2", "GLUCOSE", "BUN", "CREATININE", "CALCIUM " in the last 72 hours.  Studies/Results: DG Cervical Spine 2 or 3 views Result Date: 11/11/2023 CLINICAL DATA:  Elective surgery, intraop. EXAM: CERVICAL SPINE - 2-3 VIEW COMPARISON:  CT 09/01/2023 FINDINGS: Portable cross-table view of the cervical spine submitted from the operating room. The previous fusion at C4 has been removed, there is C5-C6 anterior fusion with interbody spacer. Interbody spacer at C4-C5 remains in place. IMPRESSION: Intraoperative spot view during cervical spine surgery. Electronically Signed   By: Chadwick Colonel M.D.   On: 11/11/2023 16:46    Assessment/Plan: Status post C5-6 anterior cervicectomy fusion plating: The patient is doing well.  I spoke with her husband. We discussed her arterial bleeding and that I recommend ICU observation overnight.  LOS: 0 days     Elder Greening 11/11/2023, 5:02 PM

## 2023-11-11 NOTE — Progress Notes (Signed)
**Note De-Identified Karren Obfuscation** Orthopedic Tech Progress Note Patient Details:  Felicia Schultz [birth year] [id number] Aspen collar was delivered to bedside in PACU Ortho Devices Type of Ortho Device: Aspen cervical collar      Felicia Schultz 11/11/2023, 5:15 PM

## 2023-11-12 ENCOUNTER — Inpatient Hospital Stay (HOSPITAL_COMMUNITY)

## 2023-11-12 ENCOUNTER — Encounter: Payer: Self-pay | Admitting: *Deleted

## 2023-11-12 DIAGNOSIS — C50411 Malignant neoplasm of upper-outer quadrant of right female breast: Secondary | ICD-10-CM

## 2023-11-12 LAB — COMPREHENSIVE METABOLIC PANEL WITH GFR
ALT: 16 U/L (ref 0–44)
AST: 16 U/L (ref 15–41)
Albumin: 2.8 g/dL — ABNORMAL LOW (ref 3.5–5.0)
Alkaline Phosphatase: 78 U/L (ref 38–126)
Anion gap: 7 (ref 5–15)
BUN: 23 mg/dL (ref 8–23)
CO2: 25 mmol/L (ref 22–32)
Calcium: 8.8 mg/dL — ABNORMAL LOW (ref 8.9–10.3)
Chloride: 105 mmol/L (ref 98–111)
Creatinine, Ser: 0.75 mg/dL (ref 0.44–1.00)
GFR, Estimated: >60 mL/min (ref >60)
Glucose, Bld: 170 mg/dL — ABNORMAL HIGH (ref 70–99)
Potassium: 3.7 mmol/L (ref 3.5–5.1)
Sodium: 137 mmol/L (ref 135–145)
Total Bilirubin: 0.3 mg/dL (ref 0.0–1.2)
Total Protein: 5.3 g/dL — ABNORMAL LOW (ref 6.5–8.1)

## 2023-11-12 MED ORDER — CHLORHEXIDINE GLUCONATE CLOTH 2 % EX PADS: 6.0000 | MEDICATED_PAD | Freq: Once | CUTANEOUS | Status: DC

## 2023-11-12 MED ORDER — IOHEXOL 350 MG/ML SOLN
75.0000 mL | Freq: Once | INTRAVENOUS | Status: AC | PRN
  Administered 2023-11-12: 75 mL via INTRAVENOUS

## 2023-11-12 MED ORDER — CIPROFLOXACIN IN D5W 400 MG/200ML IV SOLN
400.0000 mg | Freq: Once | INTRAVENOUS | Status: DC
  Filled 2023-11-12: qty 200

## 2023-11-12 MED ORDER — CHLORHEXIDINE GLUCONATE CLOTH 2 % EX PADS
6.0000 | MEDICATED_PAD | Freq: Every day | CUTANEOUS | Status: DC
  Administered 2023-11-12: 6 via TOPICAL

## 2023-11-12 MED ORDER — ACETAMINOPHEN 500 MG PO TABS: 1000.0000 mg | ORAL_TABLET | ORAL | Status: DC

## 2023-11-12 NOTE — Progress Notes (Signed)
**Note De-Identified Margolis Obfuscation** PT transferred to the unit from [facility]. PT is Ax0x4 . PT VSS and assessment completed. Pt is oriented to the unit. Pt belongings are at the bedside.Call-bell within reach and bed in the lowest position.   11/12/23 1550  Vitals  Temp 98.3 F (36.8 C)  Temp Source Oral  BP (!) 149/76  MAP (mmHg) 99  BP Location Right Arm  BP Method Automatic  Patient Position (if appropriate) Lying  Pulse Rate 66  Pulse Rate Source Monitor  ECG Heart Rate 67  Resp 18  MEWS COLOR  MEWS Score Color Green  Oxygen Therapy  SpO2 93 %  MEWS Score  MEWS Temp 0  MEWS Systolic 0  MEWS Pulse 0  MEWS RR 0  MEWS LOC 0  MEWS Score 0

## 2023-11-12 NOTE — Anesthesia Postprocedure Evaluation (Signed)
**Note De-Identified Coffie Obfuscation** Anesthesia Post Note  Patient: Felicia Schultz  Procedure(s) Performed: ANTERIOR CERVICAL DECOMPRESSION/DISCECTOMY FUSION CERVICAL FIVE-SIX Judge Notice REMOVAL     Patient location during evaluation: PACU Anesthesia Type: General Level of consciousness: awake and alert Pain management: pain level controlled Vital Signs Assessment: post-procedure vital signs reviewed and stable Respiratory status: spontaneous breathing, nonlabored ventilation, respiratory function stable and patient connected to nasal cannula oxygen Cardiovascular status: blood pressure returned to baseline and stable Postop Assessment: no apparent nausea or vomiting Anesthetic complications: no   No notable events documented.  Last Vitals:  Vitals:   11/12/23 0515 11/12/23 0600  BP: 122/60 119/64  Pulse: (!) 54 (!) 51  Resp: 12 12  Temp:    SpO2: 94% 92%    Last Pain:  Vitals:   11/12/23 0600  TempSrc:   PainSc: 2                  Valente Gaskin Sritha Chauncey

## 2023-11-12 NOTE — Plan of Care (Signed)

## 2023-11-12 NOTE — Progress Notes (Signed)
**Note De-identified Bon Obfuscation**   **Note De-Identified Pollan Obfuscation** Providing Compassionate, Quality Care - Together   Subjective: Patient reports no issues overnight.  Objective: Vital signs in last 24 hours: Temp:  [97.7 F (36.5 C)-98 F (36.7 C)] 98 F (36.7 C) (05/29 0000) Pulse Rate:  [51-98] 61 (05/29 0720) Resp:  [12-22] 14 (05/29 0720) BP: (101-170)/(53-102) 137/68 (05/29 0720) SpO2:  [92 %-99 %] 94 % (05/29 0720) FiO2 (%):  [100 %] 100 % (05/28 1641) Weight:  [84.8 kg] 84.8 kg (05/28 1043)  Intake/Output from previous day: 05/28 0701 - 05/29 0700 In: 2246.6 [P.O.:480; I.V.:1766.6] Out: 1360 [Urine:1100; Drains:60; Blood:200] Intake/Output this shift: No intake/output data recorded.  Alert and oriented x 4 PERRLA CN II-XII grossly intact MAE, Strength and sensation intact Incision is covered with Honeycomb dressing and Steri Strips; Dressing is clean, dry, and intact JP drain in place. 60 mL output documented overnight  Lab Results: No results for input(s): "WBC", "HGB", "HCT", "PLT" in the last 72 hours. BMET No results for input(s): "NA", "K", "CL", "CO2", "GLUCOSE", "BUN", "CREATININE", "CALCIUM " in the last 72 hours.  Studies/Results: DG Cervical Spine 2 or 3 views Result Date: 11/11/2023 CLINICAL DATA:  Elective surgery, intraop. EXAM: CERVICAL SPINE - 2-3 VIEW COMPARISON:  CT 09/01/2023 FINDINGS: Portable cross-table view of the cervical spine submitted from the operating room. The previous fusion at C4 has been removed, there is C5-C6 anterior fusion with interbody spacer. Interbody spacer at C4-C5 remains in place. IMPRESSION: Intraoperative spot view during cervical spine surgery. Electronically Signed   By: Chadwick Colonel M.D.   On: 11/11/2023 16:46    Assessment/Plan: Patient underwent C5-6 ACDF by Dr. Larrie Po on 11/11/2023. Her procedure was complicated by a small amount of arterial bleeding that was well-controlled prior to closure.   LOS: 1 day   -Discontinue JP drain. -Move out of ICU today. -Discharge  home tomorrow.  I am in communication with my attending and they agree with the plan for this patient.   Henreitta Locus, DNP, AGNP-C Nurse Practitioner  [facility] [address], [place], [place] [address] P: [phone]    F: [phone]  11/12/2023, 8:21 AM

## 2023-11-12 NOTE — Progress Notes (Signed)
 Patient off the floor to CT.

## 2023-11-12 NOTE — Progress Notes (Signed)
**Note De-Identified Blixt Obfuscation** OT Cancellation Note  Patient Details Name: Felicia Schultz MRN: [id number] DOB: 10/08/1950   Cancelled Treatment:    Reason Eval/Treat Not Completed: Other (comment) (Pt being prepared to be moved to another unit. OT to reattempt to see pt for OT eval at a later time as appropriate/available.)  [name] "Jenine Mix" M., OTR/L, MA Acute Rehab [phone]  Walt Gunner 11/12/2023, 2:05 PM

## 2023-11-13 ENCOUNTER — Other Ambulatory Visit (HOSPITAL_COMMUNITY): Payer: Self-pay

## 2023-11-13 ENCOUNTER — Encounter (HOSPITAL_COMMUNITY): Payer: Self-pay | Admitting: Neurosurgery

## 2023-11-13 ENCOUNTER — Telehealth: Payer: Self-pay | Admitting: Radiation Oncology

## 2023-11-13 MED ORDER — ASPIRIN 81 MG PO TBEC: 81.0000 mg | DELAYED_RELEASE_TABLET | Freq: Every day | ORAL | Status: DC

## 2023-11-13 MED ORDER — OXYCODONE-ACETAMINOPHEN 5-325 MG PO TABS
1.0000 | ORAL_TABLET | ORAL | 0 refills | Status: DC | PRN
  Filled 2023-11-13: qty 30, 5d supply, fill #0

## 2023-11-13 MED ORDER — CYCLOBENZAPRINE HCL 10 MG PO TABS
10.0000 mg | ORAL_TABLET | Freq: Three times a day (TID) | ORAL | 0 refills | Status: AC | PRN
  Filled 2023-11-13: qty 30, 10d supply, fill #0

## 2023-11-13 MED ORDER — DOCUSATE SODIUM 100 MG PO CAPS
100.0000 mg | ORAL_CAPSULE | Freq: Two times a day (BID) | ORAL | 0 refills | Status: DC
  Filled 2023-11-13: qty 10, 5d supply, fill #0

## 2023-11-13 NOTE — Progress Notes (Signed)
 Patient verbalized understanding of dc instructions. All paperwork and belongings given to patient.

## 2023-11-13 NOTE — Progress Notes (Signed)
**Note De-Identified Hlad Obfuscation** Pharmacy Antibiotic Note  Felicia Schultz is a 73 y.o. female s/p surgery on vancomycin for surgical prophylaxis.Vancomycin to stop when the drain is removed  -drain removed 5/29  Plan: -Discontinue vancomycin   Baxter Limber, PharmD Clinical Pharmacist **Pharmacist phone directory can now be found on [email or web address] ([email or web address]).  Listed under [facility].

## 2023-11-13 NOTE — Evaluation (Signed)
**Note De-Identified Gitto Obfuscation** Occupational Therapy Evaluation Patient Details Name: Felicia Schultz MRN: [id number] DOB: [birth year] Today's Date: 11/13/2023   History of Present Illness   Pt is a 73 y/o female presenting on 5/28 for same day C5-6 ACDF. Transferred to ICU due to vertebral artery injury intraoperatively. PMH includes: anemia, aflutter, GI bleed, HTN, afib, PONV, breast cancer, prior cervical surgery.     Clinical Impressions Patient admitted for above and presents with problem list below.  PTA pt was independent and working. Patient was educated on cervical brace mgmt and wearing schedule, cervical precautions, ADL compensatory techniques, AE/DME, mobility progression, safety and recommendations.  Today, pt demonstrated ability to complete bed mobility with supervision, transfers with independence, functional mobility with independence and no AD, and ADLs with up to modified independence after education of compensatory techniques.  Good understanding and adherence to cervical precautions throughout session. At discharge, pt will have support from spouse as needed.  Based on performance today, no further OT needs have been identified.  OT will sign off.       If plan is discharge home, recommend the following:   Assistance with cooking/housework;Assist for transportation     Functional Status Assessment   Patient has had a recent decline in their functional status and demonstrates the ability to make significant improvements in function in a reasonable and predictable amount of time.     Equipment Recommendations   None recommended by OT     Recommendations for Other Services         Precautions/Restrictions   Precautions Precautions: Fall;Cervical Precaution Booklet Issued: Yes (comment) Recall of Precautions/Restrictions: Intact Precaution/Restrictions Comments: reviewed with pt Required Braces or Orthoses: Cervical Brace Cervical Brace: Hard collar;For comfort (per neurosurgery 5/30  brace ad lib) Restrictions Weight Bearing Restrictions Per Provider Order: No     Mobility Bed Mobility Overal bed mobility: Needs Assistance Bed Mobility: Rolling, Sidelying to Sit, Sit to Sidelying Rolling: Supervision Sidelying to sit: Supervision     Sit to sidelying: Supervision General bed mobility comments: for log roll technique    Transfers Overall transfer level: Independent                        Balance Overall balance assessment: Mild deficits observed, not formally tested                                         ADL either performed or assessed with clinical judgement   ADL Overall ADL's : Modified independent                                       General ADL Comments: pt educated on compensatory techniques and able to manage ADLs without assist.  Good adherance to precautions.     Vision   Vision Assessment?: No apparent visual deficits     Perception         Praxis         Pertinent Vitals/Pain Pain Assessment Pain Assessment: 0-10 Pain Score: 4  Pain Location: neck incisonal Pain Descriptors / Indicators: Discomfort, Operative site guarding Pain Intervention(s): Limited activity within patient's tolerance, Monitored during session, Repositioned     Extremity/Trunk Assessment Upper Extremity Assessment Upper Extremity Assessment: Right hand dominant;Overall [facility] for tasks assessed   Lower Extremity Assessment Lower Extremity **Note De-Identified Krabill Obfuscation** Assessment: Overall WFL for tasks assessed   Cervical / Trunk Assessment Cervical / Trunk Assessment: Normal   Communication Communication Communication: No apparent difficulties   Cognition Arousal: Alert Behavior During Therapy: WFL for tasks assessed/performed Cognition: No apparent impairments                               Following commands: Intact       Cueing  General Comments   Cueing Techniques: Verbal cues  VSS on RA, educated on  swapping pads for cervical collar   Exercises     Shoulder Instructions      Home Living Family/patient expects to be discharged to:: Private residence Living Arrangements: Spouse/significant other Available Help at Discharge: Family;Available 24 hours/day Type of Home: House Home Access: Level entry     Home Layout: One level     Bathroom Shower/Tub: Producer, television/film/video: Handicapped height     Home Equipment: Shower seat - built in;Grab bars - tub/shower;Grab bars - toilet          Prior Functioning/Environment Prior Level of Function : Independent/Modified Independent;Driving;Working/employed             Mobility Comments: no AD ADLs 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    OT Problem List: Decreased activity tolerance;Decreased knowledge of use of DME or AE;Decreased knowledge of precautions;Pain   OT Treatment/Interventions:        OT Goals(Current goals can be found in the care plan section)   Acute Rehab OT Goals Patient Stated Goal: home OT Goal Formulation: With patient   OT Frequency:       Co-evaluation              AM-PAC OT "6 Clicks" Daily Activity     Outcome Measure Help from another person eating meals?: None Help from another person taking care of personal grooming?: None Help from another person toileting, which includes using toliet, bedpan, or urinal?: None Help from another person bathing (including washing, rinsing, drying)?: None Help from another person to put on and taking off regular upper body clothing?: None Help from another person to put on and taking off regular lower body clothing?: None 6 Click Score: 24   End of Session Equipment Utilized During Treatment: Cervical collar Nurse Communication: Mobility status  Activity Tolerance: Patient tolerated treatment well Patient left: with call bell/phone within reach;in bed  OT Visit Diagnosis: Pain Pain - part of body:   (neck)                Time: 1610-9604 OT Time Calculation (min): 19 min Charges:  OT General Charges $OT Visit: 1 Visit OT Evaluation $OT Eval Low Complexity: 1 Low  Bary Boss, OT Acute Rehabilitation Services Office [phone] Secure Chat Preferred    Fredrich Jefferson 11/13/2023, 11:34 AM

## 2023-11-13 NOTE — Discharge Summary (Signed)
**Note De-Identified Hy Obfuscation** Physician Discharge Summary     Providing Compassionate, Quality Care - Together   Patient ID: Felicia Schultz MRN: [id number] DOB/AGE: 03/14/1951 73 y.o.  Admit date: 11/11/2023 Discharge date: 11/13/2023  Admission Diagnoses: Cervical spondylosis with radiculopathy  Discharge Diagnoses:  Principal Problem:   Status post surgery Active Problems:   Cervical spondylosis with radiculopathy   Discharged Condition: good  Hospital Course: Patient underwent a C5-6 ACDF by Dr. Larrie Po on 11/11/2023. She was admitted to the ICU following recovery from anesthesia in the PACU due to a vertebral artery injury intraoperatively. Her postoperative course has been uncomplicated. She transferred out of the ICU on 11/12/2023. She is ambulating independently and without difficulty. She is tolerating a normal diet. She is not having any bowel or bladder dysfunction. Her pain is well-controlled with oral pain medication. She will be started on 81 mg ASA on 11/16/2023. She is ready for discharge home.   Consults: None  Significant Diagnostic Studies: radiology: CT ANGIO NECK W OR WO CONTRAST Result Date: 11/13/2023 CLINICAL DATA:  Vertebral artery dissection suspected EXAM: CT ANGIOGRAPHY NECK TECHNIQUE: Multidetector CT imaging of the neck was performed using the standard protocol during bolus administration of intravenous contrast. Multiplanar CT image reconstructions and MIPs were obtained to evaluate the vascular anatomy. Carotid stenosis measurements (when applicable) are obtained utilizing NASCET criteria, using the distal internal carotid diameter as the denominator. RADIATION DOSE REDUCTION: This exam was performed according to the departmental dose-optimization program which includes automated exposure control, adjustment of the mA and/or kV according to patient size and/or use of iterative reconstruction technique. CONTRAST:  75mL OMNIPAQUE  IOHEXOL  350 MG/ML SOLN COMPARISON:  None Available. FINDINGS:  Aortic arch: Standard branching. Imaged portion shows no evidence of aneurysm or dissection. No significant stenosis of the major arch vessel origins. Right carotid system: Mild atherosclerotic calcification at the bifurcation without hemodynamically significant stenosis. Left carotid system: Normal Vertebral arteries: Codominant. There is focal narrowing of the right vertebral artery at the C5 level. The remainder of the right vertebral artery is patent and of normal caliber. The left vertebral artery is normal. Skeleton: C5-6 ACDF. Screw tracks within the C4 vertebral body. No acute fracture. There is fusion at C6-7. Other neck: Multifocal soft tissue gas along the anterior right neck Upper chest: Clear IMPRESSION: 1. Focal narrowing of the right vertebral artery at the C5 level, which may indicate a grade 1 cerebrovascular injury. 2. No other acute vascular abnormality of the neck. 3. Multifocal soft tissue gas along the anterior right neck, likely postsurgical. Electronically Signed   By: Juanetta Nordmann M.D.   On: 11/13/2023 00:37   DG Cervical Spine 2 or 3 views Result Date: 11/11/2023 CLINICAL DATA:  Elective surgery, intraop. EXAM: CERVICAL SPINE - 2-3 VIEW COMPARISON:  CT 09/01/2023 FINDINGS: Portable cross-table view of the cervical spine submitted from the operating room. The previous fusion at C4 has been removed, there is C5-C6 anterior fusion with interbody spacer. Interbody spacer at C4-C5 remains in place. IMPRESSION: Intraoperative spot view during cervical spine surgery. Electronically Signed   By: Chadwick Colonel M.D.   On: 11/11/2023 16:46     Treatments: surgery: C5-6 anterior cervical discectomy/decompression; C5-6 interbody arthrodesis with structural allograft interbody prosthesis: anterior cervical plating from C5-6 with Zimmer titanium plate; exploration of cervical fusion/removal of cervical hardware at C4-5   Discharge Exam: Blood pressure (!) 144/74, pulse 61, temperature 98.5  F (36.9 C), temperature source Oral, resp. rate 16, height 5\' 4"  (1.626 m), weight 84.8 kg, **Note De-Identified Reser Obfuscation** SpO2 96%.  Alert and oriented x 4 PERRLA CN II-XII grossly intact MAE, Strength and sensation intact Incision is closed with Dermabond. Incision is clean, dry, and intact   Disposition: Discharge disposition: 01-Home or Self Care       Discharge Instructions     Call MD for:  difficulty breathing, headache or visual disturbances   Complete by: As directed    Call MD for:  hives   Complete by: As directed    Call MD for:  persistant dizziness or light-headedness   Complete by: As directed    Call MD for:  persistant nausea and vomiting   Complete by: As directed    Call MD for:  redness, tenderness, or signs of infection (pain, swelling, redness, odor or green/yellow discharge around incision site)   Complete by: As directed    Call MD for:  severe uncontrolled pain   Complete by: As directed    Diet - low sodium heart healthy   Complete by: As directed    Increase activity slowly   Complete by: As directed    No dressing needed   Complete by: As directed       Allergies as of 11/13/2023       Reactions   Amoxicillin -pot Clavulanate Nausea Only   Severe nausea. Couldn't keep medication down Severe nausea. Couldn't keep medication down   Clarithromycin Hives   Nitrofurantoin Other (See Comments), Rash   Unknown Palms and hand itching, turned bright red per pt   Sulfonamide Derivatives Other (See Comments)   Abdominal cramping, nausea   Azithromycin  Other (See Comments)   Z-pack, doesn't do anything Z-pack, doesn't do anything Z-pack, doesn't do anything Z-pack, doesn't do anything   Sulfa Antibiotics Nausea And Vomiting, Other (See Comments)   Unknown        Medication List     TAKE these medications    acetaminophen  500 MG tablet Commonly known as: TYLENOL  Take 1,000 mg by mouth daily as needed (pain.).   anastrozole  1 MG tablet Commonly known as:  ARIMIDEX  Take 1 tablet (1 mg total) by mouth daily.   aspirin  EC 81 MG tablet Take 1 tablet (81 mg total) by mouth daily. Swallow whole. Start taking on: November 16, 2023   citalopram  40 MG tablet Commonly known as: CELEXA  Take 1 tablet (40 mg total) by mouth daily. What changed: when to take this   cyclobenzaprine  10 MG tablet Commonly known as: FLEXERIL  Take 1 tablet (10 mg total) by mouth 3 (three) times daily as needed for muscle spasms.   docusate sodium  100 MG capsule Commonly known as: COLACE Take 1 capsule (100 mg total) by mouth 2 (two) times daily.   estradiol  0.1 MG/GM vaginal cream Commonly known as: ESTRACE  Place 1 Applicatorful vaginally at bedtime.   ferrous sulfate  325 (65 FE) MG tablet Take 325 mg by mouth 3 (three) times a week.   gabapentin  300 MG capsule Commonly known as: NEURONTIN  Take 300 mg by mouth daily as needed (pain).   gabapentin  100 MG capsule Commonly known as: NEURONTIN  Take 100 mg by mouth daily as needed (pain).   MULTIVITAMIN GUMMIES ADULT PO Take 1 tablet by mouth every evening.   omeprazole  40 MG capsule Commonly known as: PRILOSEC Take 1 capsule (40 mg total) by mouth daily before breakfast. 30 mins before breakfast What changed: when to take this   oxyCODONE -acetaminophen  5-325 MG tablet Commonly known as: Percocet Take 1 tablet by mouth every 4 (four) hours as **Note De-Identified Capano Obfuscation** needed.   propranolol  ER 60 MG 24 hr capsule Commonly known as: Inderal  LA Take 1 capsule (60 mg total) by mouth daily. What changed: when to take this   VITAMIN B 12 PO Take 1 tablet by mouth every evening.   Vitamin D  (Ergocalciferol ) 1.25 MG (50000 UNIT) Caps capsule Commonly known as: DRISDOL  Take 50000IU once weekly. What changed:  how much to take how to take this when to take this additional instructions               Discharge Care Instructions  (From admission, onward)           Start     Ordered   11/13/23 0000  No dressing needed         11/13/23 4098            Follow-up Information     Garry Kansas, MD. Go on 12/04/2023.   Specialty: Neurosurgery Why: First post op appointment with x-rays is on 12/04/2023 at 8:30 AM. Contact information: [address] [place] [address] [phone]                 Signed: Henreitta Locus, DNP, AGNP-C Nurse Practitioner  [facility] [address], [place], [place] [address] P: [phone]    F: [phone]  11/13/2023, 8:44 AM

## 2023-11-13 NOTE — Plan of Care (Signed)

## 2023-11-13 NOTE — Telephone Encounter (Signed)
Left message for patient to call back to schedule consult per 5/29 referral.

## 2023-11-13 NOTE — Discharge Instructions (Signed)
**Note De-identified Kai Obfuscation** ** **Note De-Identified Crammer Obfuscation** Start 81 mg aspirin on 11/16/2023**  Wound Care Your incision is covered with Dermabond (skin glue). Do not put any creams, lotions, or ointments on incision. You are fine to shower. Let water run over incision and pat dry.  Activity Walk each and every day, increasing distance each day. No lifting greater than 5 lbs.  Avoid excessive neck motion. No driving for 2 weeks; may ride as a passenger locally.  Diet Resume your normal diet.   Return to Work Will be discussed at your follow up appointment.  Call Your Doctor If Any of These Occur Redness, drainage, or swelling at the wound.  Temperature greater than 101 degrees. Severe pain not relieved by pain medication. Incision starts to come apart.  Follow Up Appt Call [phone] today for appointment in 3-4 weeks if you don't already have one or for any problems.  If you have any hardware placed in your spine, you will need an x-ray before your appointment.

## 2023-11-13 NOTE — Progress Notes (Signed)
**Note De-Identified Liebig Obfuscation** Patient is back in her room from CT.

## 2023-11-16 ENCOUNTER — Telehealth: Payer: Self-pay

## 2023-11-16 ENCOUNTER — Other Ambulatory Visit: Payer: Self-pay

## 2023-11-16 ENCOUNTER — Encounter (HOSPITAL_BASED_OUTPATIENT_CLINIC_OR_DEPARTMENT_OTHER): Payer: Self-pay | Admitting: General Surgery

## 2023-11-16 NOTE — Progress Notes (Signed)
**Note De-Identified [place] Obfuscation** Message left for Jenette Mitchell at Dr. Mills [place] office regarding aspirin  orders.

## 2023-11-16 NOTE — Progress Notes (Signed)
**Note De-Identified Heldt Obfuscation** Patient has history of difficult airway. Reviewed by Dr. Jarrell Merritts. Ok for surgery as planned at [facility] 11/24/2023.

## 2023-11-16 NOTE — Transitions of Care (Post Inpatient/ED Visit) (Signed)
**Note De-identified [place] Obfuscation**   **Note De-Identified Mcadams Obfuscation** 11/16/2023  Name: Felicia Schultz MRN: [id number] DOB: 1950/10/10  Today's TOC FU Call Status: Today's TOC FU Call Status:: Unsuccessful Call (1st Attempt) Unsuccessful Call (1st Attempt) Date: 11/16/23  Attempted to reach the patient regarding the most recent Inpatient/ED visit.  Follow Up Plan: Additional outreach attempts will be made to reach the patient to complete the Transitions of Care (Post Inpatient/ED visit) call.   Signature  Germain Kohler, CMA (AAMA)  [facility] [phone]

## 2023-11-17 NOTE — Progress Notes (Signed)
**Note De-Identified Maggi Obfuscation** Patient had C5-6 fusion on 11-12-23 and was told to start ASA 81mg  after surgery. Patient states that she has not started ASA as of yet.

## 2023-11-20 ENCOUNTER — Other Ambulatory Visit: Payer: Self-pay | Admitting: General Surgery

## 2023-11-20 ENCOUNTER — Ambulatory Visit
Admission: RE | Admit: 2023-11-20 | Discharge: 2023-11-20 | Disposition: A | Discharge status: Home / Self Care | Source: Ambulatory Visit | Attending: General Surgery | Admitting: General Surgery

## 2023-11-20 DIAGNOSIS — C50411 Malignant neoplasm of upper-outer quadrant of right female breast: Secondary | ICD-10-CM

## 2023-11-20 DIAGNOSIS — D241 Benign neoplasm of right breast: Secondary | ICD-10-CM

## 2023-11-20 HISTORY — PX: BREAST BIOPSY: SHX20

## 2023-11-23 NOTE — H&P (Signed)
**Note De-Identified [place] Obfuscation** REFERRING PHYSICIAN:  Dru Georges, MD   PROVIDER:  Eppie Hasting, MD   Care Team: Patient Care Team: Rodney Clamp, MD as PCP - General (Family Medicine) Cherlynn Cornfield Chaya Cord, MD as Consulting Provider (Surgical Oncology) Chaneta Comer, MD (Hematology and Oncology) Lanae Pinal, MD (Radiation Oncology) Elder Greening, MD (Neurosurgery) Jeanie Miller, MD (Radiology)    MRN: [id number] DOB: 10/13/1950 DATE OF ENCOUNTER: 10/28/2023   Subjective    Chief Complaint: New Consultation (Breast Cancer)     History of Present Illness: Felicia Schultz is a 73 y.o. female who is seen today as an office consultation at the request of Dr. Gudena for evaluation of New Consultation (Breast Cancer) .     Patient has a new diagnosis of right breast cancer May 2025.  The patient presented with a palpable mass in the right breast.  Diagnostic imaging was performed.  This showed 2 right breast masses.  A 2.4 cm mass was shown at 2:00 which was found to be benign.  This was mixed solid and cystic.  A 1.3 cm mass was noted at 12:00.  A enlarged lymph node was also seen.  Core needle biopsies performed of all 3 of these.  The 2:00 mass was a papilloma.  The 12:00 mass was a grade 2 invasive ductal carcinoma that was strongly ER/PR positive, HER2 negative, Ki-67 of 5%.  The lymph node was also shown to be positive.   Of note, the patient also has been having issues with her cervical spine and is scheduled for cervical decompression and fusion with Dr. Garry Kansas coming up.  She is at the point where it is horribly painful to move her right side due to radiculopathy.  She has had therapy and injections and none of these are working any longer.  Pain medication and other adjunctive medicines are also not effective at all.     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  FINDINGS: In the right breast, there is a circumscribed mass in the upper outer breast, along the posterior aspect of the surgical site, measuring 2.2 cm in size, corresponding to the palpable lump. Posterior and superior to this is a bilobed mass versus 2 contiguous masses, combined maximum length 1.1 cm. Postsurgical architectural distortion with associated surgical vascular clips are centered along the anterior margin of the right breast mass in the upper outer quadrant at 11-12 o'clock. In the right axilla, there is a single enlarged/thickened lymph node.  In the left breast, there are no masses, areas of significant asymmetry, areas of architectural distortion or suspicious calcifications. No mammographic change on the left.  On physical exam, there is a firm palpable mass in the upper outer right breast near 12 o'clock.  Targeted ultrasound is performed, showing a cystic and solid mass at 12 o'clock, 2 cm the nipple, measuring 2.4 x 1.6 x 1.7 cm in size. This corresponds to the palpable mass. The solid component lies along 1 wall, measuring 1.2 x 0.9 x 0.9 cm. Color Doppler blood flow is seen along the periphery of the solid component.  Superior to the palpable mass is a hypoechoic, oval mass with partly **Note De-Identified [place] Obfuscation** ill-defined and a single angular margin measuring 1.3 x 0.6 x 0.6 cm. This mass shows internal blood flow on color Doppler analysis and lies at 12 o'clock, 6 cm from the nipple. Linear hypoechoic tissue/small masses with an apparent associated mildly ectatic duct extends between the cystic and solid mass and the 1.3 cm mass, lesions separated by 3 cm.  Sonographic imaging of the right axilla demonstrates a  single abnormal lymph node measuring 2.2 x 1.6 x 2.0 cm. There is no significant hilar fat. Increased blood flow is seen on color Doppler analysis.  IMPRESSION: 1. Findings highly suspicious for right breast malignancy. Cystic and solid mass in the right breast at 12 o'clock, 2 cm the nipple corresponds to the palpable lump. There is a linearly arranged area of abnormal tissue/masses extending for 3 cm posterosuperior to the mass, the more peripheral of which represents a discrete 1.3 cm mass corresponding to the bilobed mass seen mammographically, at 12 o'clock, 6 cm from the nipple. Single abnormal right axillary lymph node. 2. No evidence of left breast malignancy.  RECOMMENDATION: 1. Ultrasound-guided core needle biopsies of the right breast x2 and right axilla x1. Recommend ultrasound-guided core needle biopsy of the solid component of the cystic and solid mass as well as of the 1.3 cm mass at 12 o'clock, 6 cm the nipple. Ultrasound-guided core needle biopsy of the single abnormal right axillary lymph node.  I have discussed the findings and recommendations with the patient. If applicable, a reminder letter will be sent to the patient regarding the next appointment.  BI-RADS CATEGORY  5: Highly suggestive of malignancy.       Pathology core needle biopsy: 10/21/23 1. Breast, right, needle core biopsy, 12:00 2cmfn (coil clip) :      FRAGMENTS OF AN INTRADUCTAL PAPILLOMA SHOWING APOCRINE METAPLASIA AND USUAL DUCT      HYPERPLASIA      FOCAL CHANGES CONSISTENT WITH PRIOR DUCT RUPTURE      RARE MICROCALCIFICATION PRESENT WITHIN PAPILLOMA/UDH      NEGATIVE FOR ATYPIA AND CARCINOMA       2. Breast, right, needle core biopsy, 12:00 6cmfn (heart clip) :      INVASIVE MODERATELY DIFFERENTIATED DUCTAL ADENOCARCINOMA WITH APOCRINE FEATURES,      GRADE 2 (3+2+1)      TUBULE FORMATION: SCORE 3      NUCLEAR PLEOMORPHISM: SCORE 2      MITOTIC COUNT: SCORE 1      TOTAL SCORE: 6       OVERALL GRADE: GRADE 2 (6/9)      NEGATIVE FOR ANGIOLYMPHATIC INVASION      ADJACENT FRAGMENT OF CYST WALL      TUMOR MEASURES 4.2 MM IN GREATEST LINEAR EXTENT       3. Lymph node, needle/core biopsy, Right axillary node (hydromark clip) :      METASTATIC DUCTAL ADENOCARCINOMA WITH APOCRINE FEATURES      NODAL TISSUE PRESENT    Receptors:10/21/23 Estrogen Receptor:  100%, POSITIVE, STRONG STAINING INTENSITY  Progesterone Receptor:  95%, POSITIVE, STRONG STAINING INTENSITY  Proliferation Marker Ki67:  5%  GROUP 5:   HER2 **NEGATIVE**    Review of Systems: A complete review of systems was obtained from the patient.  I have reviewed this information and discussed as appropriate with the patient.  See HPI as well for other ROS. ROS -for fatigue and pain as described above, also positive for some shortness of breath.  She has some urinary incontinence.  Possible repeat squamous **Note De-Identified Filler Obfuscation** cell skin cancers, mild forgetfulness, hot flashes       Medical History: Past Medical History      Past Medical History:  Diagnosis Date   Anemia     Diabetes mellitus without complication (CMS/HHS-HCC)     GERD (gastroesophageal reflux disease)     Hypertension          Problem List     Patient Active Problem List  Diagnosis   Malignant neoplasm of upper-outer quadrant of right breast in female, estrogen receptor positive (CMS/HHS-HCC)   Intraductal papilloma of right breast   Cervical spinal cord compression (CMS/HHS-HCC)        Past Surgical History       Past Surgical History:  Procedure Laterality Date   ABLATION ARRYTHMIA FOCUS            Allergies       Allergies  Allergen Reactions   Nitrofurantoin Other (See Comments) and Rash      Unknown  Unknown  Palms and hand itching, turned bright red per pt   Unknown   Unknown  Palms and hand itching, turned bright red per pt   Azithromycin  Other (See Comments)      Z-pack, doesn't do anything  Z-pack, doesn't do anything  Z-pack,  doesn't do anything  Z-pack, doesn't do anything  Z-pack, doesn't do anything  Z-pack, doesn't do anything  Z-pack, doesn't do anything  Z-pack, doesn't do anything   Z-pack, doesn't do anything   Z-pack, doesn't do anything  Z-pack, doesn't do anything  Z-pack, doesn't do anything   Z-pack, doesn't do anything  Z-pack, doesn't do anything  Z-pack, doesn't do anything  Z-pack, doesn't do anything   Sulfa (Sulfonamide Antibiotics) Nausea And Vomiting and Other (See Comments)      Unknown   Amoxicillin  Other (See Comments)        Medications Ordered Prior to Encounter        Current Outpatient Medications on File Prior to Visit  Medication Sig Dispense Refill   citalopram  (CELEXA ) 40 MG tablet Take 40 mg by mouth once daily       cyanocobalamin , vitamin B-12, 3,000 mcg Cap Take by mouth       cyclobenzaprine  (FLEXERIL ) 10 MG tablet Take 10 mg by mouth 3 (three) times daily as needed       ergocalciferol , vitamin D2, 1,250 mcg (50,000 unit) capsule Take 50000IU once weekly.       estradioL  (ESTRACE ) 0.01 % (0.1 mg/gram) vaginal cream Place 1 Applicatorful vaginally       ferrous sulfate  325 (65 FE) MG tablet Take 325 mg by mouth       gabapentin  (NEURONTIN ) 300 MG capsule Take 300 mg by mouth       omeprazole  (PRILOSEC) 40 MG DR capsule Take 40 mg by mouth       propranoloL  (INDERAL  LA) 60 MG LA capsule Take 60 mg by mouth once daily        No current facility-administered medications on file prior to visit.        Family History       Family History  Problem Relation Age of Onset   Diabetes Mother     Hyperlipidemia (Elevated cholesterol) Father          Tobacco Use History  Social History       Tobacco Use  Smoking Status Never  Smokeless Tobacco Never        Social History  Social History **Note De-Identified Guzy Obfuscation** Socioeconomic History   Marital status: Married  Tobacco Use   Smoking status: Never   Smokeless tobacco: Never  Vaping Use   Vaping status: Never Used   Substance and Sexual Activity   Alcohol use: Not Currently   Drug use: Never    Social Drivers of Health        Food Insecurity: No Food Insecurity (06/24/2023)    Received from [facility]    Hunger Vital Sign     Worried About Running Out of Food in the Last Year: Never true     Ran Out of Food in the Last Year: Never true  Transportation Needs: No Transportation Needs (06/24/2023)    Received from [facility] - Transportation     Lack of Transportation (Medical): No     Lack of Transportation (Non-Medical): No  Social Connections: Moderately Integrated (06/24/2023)    Received from [facility]    Social Connection and Isolation Panel [NHANES]     Frequency of Communication with Friends and Family: More than three times a week     Frequency of Social Gatherings with Friends and Family: Once a week     Attends Religious Services: 1 to 4 times per year     Active Member of [employer] or Organizations: No     Attends Banker Meetings: Never     Marital Status: Married  Housing Stability: Low Risk  (06/24/2023)    [facility]    Housing Stability Vital Sign     Unable to Pay for Housing in the Last Year: No     Number of Times Moved in the Last Year: 0     Homeless in the Last Year: No        Objective:         Vitals:    10/28/23 1257  BP: (!) 141/64  Pulse: 73  Resp: 18  Temp: 36.4 C (97.5 F)  Weight: 84.7 kg (186 lb 12.8 oz)  Height: 162.6 cm (5\' 4" )    Body mass index is 32.06 kg/m.     Gen:  No acute distress.  Well nourished and well groomed.   Neurological: Alert and oriented to person, place, and time. Coordination normal.  Head: Normocephalic and atraumatic.  Eyes: Conjunctivae are normal. Pupils are equal, round, and reactive to light. No scleral icterus.  Neck: Normal range of motion. Neck supple. No tracheal deviation or thyromegaly present.  Cardiovascular: Normal rate, regular rhythm, normal heart sounds and intact  distal pulses.  Exam reveals no gallop and no friction rub.  No murmur heard. Breast: Vaguely palpable right lymph node, right breast smaller than left.  Ptosis bilaterally.  Breast masses are not definitively palpable to me.  No nipple retraction or nipple discharge.  Super.  Your circumareolar scar well-healed.  Some thickening in the superior breast on the right.  Left breast benign. Respiratory: Effort normal.  No respiratory distress. No chest wall tenderness. Breath sounds normal.  No wheezes, rales or rhonchi.  GI: Soft. Bowel sounds are normal. The abdomen is soft and nontender.  There is no rebound and no guarding.  Musculoskeletal: Normal range of motion. Extremities are nontender.  Lymphadenopathy: No cervical, preauricular, postauricular or axillary adenopathy is present Skin: Skin is warm and dry. No rash noted. No diaphoresis. No erythema. No pallor. No clubbing, cyanosis, or edema.   Psychiatric: Normal mood and affect. Behavior is normal. Judgment and thought content normal. **Note De-Identified [place] Obfuscation** Labs 10/28/23 CBC normal   Assessment and Plan:        ICD-10-CM    1. Intraductal papilloma of right breast  D24.1       2. Malignant neoplasm of upper-outer quadrant of right breast in female, estrogen receptor positive (CMS/HHS-HCC)  C50.411      Z17.0       3. Cervical spinal cord compression (CMS/HHS-HCC)  G95.20         Patient has a new diagnosis of right clinical t1c N1 hormone positive breast cancer.  She also has second right breast papilloma.  She would like these both removed.  This is very reasonable.  Her spine surgery is not elective at this point due to the symptoms that she is having.  She is planning to go ahead and have this done and we are going to start her on neoadjuvant endocrine treatment.  We are going to try to do her breast surgery toward the end of her FMLA for her neck.  She does not have any lifting requirements for her job so she should be able to go back within about a  week of breast surgery.  I am going to have her see plastic surgery to discuss options for symmetry as her right breast is already smaller than her left and the surgery will exacerbate this difference.   I will plan a seed targeted lumpectomy on the right as well as a seed targeted excisional biopsy to go ahead and excise the papilloma.  We will also do a seed targeted lymph node excision along with sentinel lymph node biopsy.   Breast conserving therapy would be followed by radiation.  Oncotype would be performed and then adjuvant radiation would be performed.   I reviewed the process of surgery including seed placement and its rationale.  I discussed risk of surgery including bleeding, infection, heart or lung complications, blood clot, dissatisfaction with the appearance of the breast, contour abnormality, breast pain, breast or arm lymphedema, seroma, possible need for additional surgery, possible cancer recurrence, and more.  Patient understands and wished to proceed.  Again we will try to time this appropriately from surgery.

## 2023-11-24 ENCOUNTER — Ambulatory Visit (HOSPITAL_BASED_OUTPATIENT_CLINIC_OR_DEPARTMENT_OTHER): Payer: Self-pay | Admitting: Anesthesiology

## 2023-11-24 ENCOUNTER — Other Ambulatory Visit: Payer: Self-pay

## 2023-11-24 ENCOUNTER — Encounter (HOSPITAL_BASED_OUTPATIENT_CLINIC_OR_DEPARTMENT_OTHER)
Admission: RE | Disposition: A | Discharge status: Home / Self Care | Payer: Self-pay | Source: Home / Self Care | Attending: General Surgery

## 2023-11-24 ENCOUNTER — Ambulatory Visit
Admission: RE | Admit: 2023-11-24 | Discharge: 2023-11-24 | Disposition: A | Discharge status: Home / Self Care | Source: Ambulatory Visit | Attending: General Surgery | Admitting: General Surgery

## 2023-11-24 ENCOUNTER — Ambulatory Visit (HOSPITAL_BASED_OUTPATIENT_CLINIC_OR_DEPARTMENT_OTHER)
Admission: RE | Admit: 2023-11-24 | Discharge: 2023-11-24 | Disposition: A | Discharge status: Home / Self Care | Attending: General Surgery | Admitting: General Surgery

## 2023-11-24 ENCOUNTER — Encounter (HOSPITAL_BASED_OUTPATIENT_CLINIC_OR_DEPARTMENT_OTHER): Payer: Self-pay | Admitting: General Surgery

## 2023-11-24 DIAGNOSIS — D241 Benign neoplasm of right breast: Secondary | ICD-10-CM | POA: Diagnosis present

## 2023-11-24 DIAGNOSIS — R251 Tremor, unspecified: Secondary | ICD-10-CM | POA: Diagnosis not present

## 2023-11-24 DIAGNOSIS — N6021 Fibroadenosis of right breast: Secondary | ICD-10-CM | POA: Diagnosis not present

## 2023-11-24 DIAGNOSIS — I10 Essential (primary) hypertension: Secondary | ICD-10-CM | POA: Insufficient documentation

## 2023-11-24 DIAGNOSIS — Z170 Estrogen receptor positive status [ER+]: Secondary | ICD-10-CM | POA: Insufficient documentation

## 2023-11-24 DIAGNOSIS — K219 Gastro-esophageal reflux disease without esophagitis: Secondary | ICD-10-CM | POA: Diagnosis not present

## 2023-11-24 DIAGNOSIS — D649 Anemia, unspecified: Secondary | ICD-10-CM | POA: Insufficient documentation

## 2023-11-24 DIAGNOSIS — C773 Secondary and unspecified malignant neoplasm of axilla and upper limb lymph nodes: Secondary | ICD-10-CM | POA: Insufficient documentation

## 2023-11-24 DIAGNOSIS — M1990 Unspecified osteoarthritis, unspecified site: Secondary | ICD-10-CM | POA: Insufficient documentation

## 2023-11-24 DIAGNOSIS — C50411 Malignant neoplasm of upper-outer quadrant of right female breast: Secondary | ICD-10-CM

## 2023-11-24 DIAGNOSIS — Z1721 Progesterone receptor positive status: Secondary | ICD-10-CM | POA: Insufficient documentation

## 2023-11-24 DIAGNOSIS — Z1732 Human epidermal growth factor receptor 2 negative status: Secondary | ICD-10-CM | POA: Diagnosis not present

## 2023-11-24 DIAGNOSIS — I4891 Unspecified atrial fibrillation: Secondary | ICD-10-CM | POA: Diagnosis not present

## 2023-11-24 DIAGNOSIS — C50811 Malignant neoplasm of overlapping sites of right female breast: Secondary | ICD-10-CM | POA: Diagnosis present

## 2023-11-24 DIAGNOSIS — Z79899 Other long term (current) drug therapy: Secondary | ICD-10-CM | POA: Diagnosis not present

## 2023-11-24 HISTORY — PX: BREAST LUMPECTOMY WITH RADIOACTIVE SEED LOCALIZATION: SHX6424

## 2023-11-24 HISTORY — PX: SENTINEL NODE BIOPSY: SHX6608

## 2023-11-24 SURGERY — BREAST LUMPECTOMY WITH RADIOACTIVE SEED LOCALIZATION
Anesthesia: Regional | Site: Breast | Laterality: Right

## 2023-11-24 MED ORDER — MIDAZOLAM HCL 2 MG/2ML IJ SOLN
INTRAMUSCULAR | Status: AC
Start: 2023-11-24 — End: ?
  Filled 2023-11-24: qty 2

## 2023-11-24 MED ORDER — ACETAMINOPHEN 500 MG PO TABS
1000.0000 mg | ORAL_TABLET | Freq: Once | ORAL | Status: AC
  Administered 2023-11-24: 1000 mg via ORAL

## 2023-11-24 MED ORDER — ROCURONIUM BROMIDE 100 MG/10ML IV SOLN
INTRAVENOUS | Status: DC | PRN
  Administered 2023-11-24: 60 mg via INTRAVENOUS

## 2023-11-24 MED ORDER — BUPIVACAINE HCL (PF) 0.5 % IJ SOLN
INTRAMUSCULAR | Status: DC | PRN
  Administered 2023-11-24: 15 mL

## 2023-11-24 MED ORDER — MAGTRACE LYMPHATIC TRACER
INTRAMUSCULAR | Status: DC | PRN
  Administered 2023-11-24: 2 mL via INTRAMUSCULAR

## 2023-11-24 MED ORDER — 0.9 % SODIUM CHLORIDE (POUR BTL) OPTIME
TOPICAL | Status: DC | PRN
  Administered 2023-11-24: 1000 mL

## 2023-11-24 MED ORDER — FENTANYL CITRATE (PF) 100 MCG/2ML IJ SOLN
100.0000 ug | Freq: Once | INTRAMUSCULAR | Status: AC
  Administered 2023-11-24: 50 ug via INTRAVENOUS

## 2023-11-24 MED ORDER — CEFAZOLIN SODIUM-DEXTROSE 2-4 GM/100ML-% IV SOLN
2.0000 g | Freq: Once | INTRAVENOUS | Status: AC
  Administered 2023-11-24: 2 g via INTRAVENOUS

## 2023-11-24 MED ORDER — CELECOXIB 200 MG PO CAPS
200.0000 mg | ORAL_CAPSULE | Freq: Once | ORAL | Status: DC
Start: 2023-11-24 — End: 2023-11-24

## 2023-11-24 MED ORDER — HYDROMORPHONE HCL 1 MG/ML IJ SOLN: 0.2500 mg | INTRAMUSCULAR | Status: DC | PRN

## 2023-11-24 MED ORDER — DEXAMETHASONE SODIUM PHOSPHATE 10 MG/ML IJ SOLN
INTRAMUSCULAR | Status: AC
  Filled 2023-11-24: qty 1

## 2023-11-24 MED ORDER — PHENYLEPHRINE 80 MCG/ML (10ML) SYRINGE FOR IV PUSH (FOR BLOOD PRESSURE SUPPORT)
PREFILLED_SYRINGE | INTRAVENOUS | Status: AC
  Filled 2023-11-24: qty 10

## 2023-11-24 MED ORDER — MIDAZOLAM HCL 5 MG/5ML IJ SOLN
INTRAMUSCULAR | Status: DC | PRN
  Administered 2023-11-24: 1 mg via INTRAVENOUS

## 2023-11-24 MED ORDER — PROPOFOL 10 MG/ML IV BOLUS
INTRAVENOUS | Status: DC | PRN
  Administered 2023-11-24: 150 mg via INTRAVENOUS

## 2023-11-24 MED ORDER — ATROPINE SULFATE 0.4 MG/ML IV SOLN
INTRAVENOUS | Status: AC
  Filled 2023-11-24: qty 1

## 2023-11-24 MED ORDER — LACTATED RINGERS IV SOLN: INTRAVENOUS | Status: DC

## 2023-11-24 MED ORDER — OXYCODONE HCL 5 MG PO TABS
ORAL_TABLET | ORAL | Status: AC
  Filled 2023-11-24: qty 1

## 2023-11-24 MED ORDER — ONDANSETRON HCL 4 MG/2ML IJ SOLN
INTRAMUSCULAR | Status: AC
  Filled 2023-11-24: qty 2

## 2023-11-24 MED ORDER — ACETAMINOPHEN 500 MG PO TABS
ORAL_TABLET | ORAL | Status: AC
  Filled 2023-11-24: qty 2

## 2023-11-24 MED ORDER — OXYCODONE HCL 5 MG PO TABS: 5.0000 mg | ORAL_TABLET | Freq: Four times a day (QID) | ORAL | 0 refills | Status: DC | PRN

## 2023-11-24 MED ORDER — LIDOCAINE 2% (20 MG/ML) 5 ML SYRINGE
INTRAMUSCULAR | Status: AC
  Filled 2023-11-24: qty 5

## 2023-11-24 MED ORDER — CEFAZOLIN SODIUM-DEXTROSE 2-4 GM/100ML-% IV SOLN
INTRAVENOUS | Status: AC
  Filled 2023-11-24: qty 100

## 2023-11-24 MED ORDER — CELECOXIB 200 MG PO CAPS
ORAL_CAPSULE | ORAL | Status: AC
  Filled 2023-11-24: qty 1

## 2023-11-24 MED ORDER — FENTANYL CITRATE (PF) 100 MCG/2ML IJ SOLN
INTRAMUSCULAR | Status: AC
  Filled 2023-11-24: qty 2

## 2023-11-24 MED ORDER — DROPERIDOL 2.5 MG/ML IJ SOLN: 0.6250 mg | Freq: Once | INTRAMUSCULAR | Status: DC | PRN

## 2023-11-24 MED ORDER — FENTANYL CITRATE (PF) 100 MCG/2ML IJ SOLN
INTRAMUSCULAR | Status: DC | PRN
  Administered 2023-11-24 (×2): 25 ug via INTRAVENOUS
  Administered 2023-11-24 (×3): 50 ug via INTRAVENOUS

## 2023-11-24 MED ORDER — ONDANSETRON HCL 4 MG/2ML IJ SOLN
INTRAMUSCULAR | Status: DC | PRN
  Administered 2023-11-24: 4 mg via INTRAVENOUS

## 2023-11-24 MED ORDER — BUPIVACAINE LIPOSOME 1.3 % IJ SUSP
INTRAMUSCULAR | Status: DC | PRN
Start: 2023-11-24 — End: 2023-11-24
  Administered 2023-11-24: 10 mL

## 2023-11-24 MED ORDER — OXYCODONE HCL 5 MG/5ML PO SOLN: 5.0000 mg | Freq: Once | ORAL | Status: AC | PRN

## 2023-11-24 MED ORDER — SUGAMMADEX SODIUM 200 MG/2ML IV SOLN
INTRAVENOUS | Status: DC | PRN
  Administered 2023-11-24: 200 mg via INTRAVENOUS

## 2023-11-24 MED ORDER — LIDOCAINE HCL (CARDIAC) PF 100 MG/5ML IV SOSY
PREFILLED_SYRINGE | INTRAVENOUS | Status: DC | PRN
  Administered 2023-11-24: 20 mg via INTRAVENOUS

## 2023-11-24 MED ORDER — FENTANYL CITRATE (PF) 100 MCG/2ML IJ SOLN
INTRAMUSCULAR | Status: AC
Start: 2023-11-24 — End: ?
  Filled 2023-11-24: qty 2

## 2023-11-24 MED ORDER — MIDAZOLAM HCL 2 MG/2ML IJ SOLN
2.0000 mg | Freq: Once | INTRAMUSCULAR | Status: AC
  Administered 2023-11-24: 1 mg via INTRAVENOUS

## 2023-11-24 MED ORDER — OXYCODONE HCL 5 MG PO TABS
5.0000 mg | ORAL_TABLET | Freq: Once | ORAL | Status: AC | PRN
  Administered 2023-11-24: 5 mg via ORAL

## 2023-11-24 MED ORDER — MIDAZOLAM HCL 2 MG/2ML IJ SOLN
INTRAMUSCULAR | Status: AC
  Filled 2023-11-24: qty 2

## 2023-11-24 MED ORDER — EPHEDRINE 5 MG/ML INJ
INTRAVENOUS | Status: AC
  Filled 2023-11-24: qty 5

## 2023-11-24 MED ORDER — SUCCINYLCHOLINE CHLORIDE 200 MG/10ML IV SOSY
PREFILLED_SYRINGE | INTRAVENOUS | Status: AC
Start: 2023-11-24 — End: ?
  Filled 2023-11-24: qty 10

## 2023-11-24 MED ORDER — PROPOFOL 500 MG/50ML IV EMUL
INTRAVENOUS | Status: DC | PRN
Start: 2023-11-24 — End: 2023-11-24
  Administered 2023-11-24: 125 ug/kg/min via INTRAVENOUS

## 2023-11-24 MED ORDER — LIDOCAINE HCL 1 % IJ SOLN
INTRAMUSCULAR | Status: DC | PRN
  Administered 2023-11-24: 10 mL

## 2023-11-24 MED ORDER — ROCURONIUM BROMIDE 10 MG/ML (PF) SYRINGE
PREFILLED_SYRINGE | INTRAVENOUS | Status: AC
  Filled 2023-11-24: qty 10

## 2023-11-24 SURGICAL SUPPLY — 61 items
BINDER BREAST LRG (GAUZE/BANDAGES/DRESSINGS) IMPLANT
BINDER BREAST MEDIUM (GAUZE/BANDAGES/DRESSINGS) IMPLANT
BINDER BREAST XLRG (GAUZE/BANDAGES/DRESSINGS) IMPLANT
BINDER BREAST XXLRG (GAUZE/BANDAGES/DRESSINGS) IMPLANT
BLADE SURG 10 STRL SS (BLADE) ×3 IMPLANT
BLADE SURG 15 STRL LF DISP TIS (BLADE) ×3 IMPLANT
BNDG COHESIVE 4X5 TAN STRL LF (GAUZE/BANDAGES/DRESSINGS) ×3 IMPLANT
CANISTER SUC SOCK COL 7IN (MISCELLANEOUS) IMPLANT
CANISTER SUCT 1200ML W/VALVE (MISCELLANEOUS) ×3 IMPLANT
CHLORAPREP W/TINT 26 (MISCELLANEOUS) ×3 IMPLANT
CLIP TI LARGE 6 (CLIP) ×3 IMPLANT
CLIP TI MEDIUM 6 (CLIP) ×7 IMPLANT
CLIP TI WIDE RED SMALL 6 (CLIP) ×1 IMPLANT
CNTNR URN SCR LID CUP LEK RST (MISCELLANEOUS) ×6 IMPLANT
COVER BACK TABLE 60X90IN (DRAPES) ×3 IMPLANT
COVER MAYO STAND STRL (DRAPES) ×6 IMPLANT
COVER PROBE CYLINDRICAL 5X96 (MISCELLANEOUS) ×4 IMPLANT
DERMABOND ADVANCED .7 DNX12 (GAUZE/BANDAGES/DRESSINGS) ×3 IMPLANT
DRAPE LAPAROSCOPIC ABDOMINAL (DRAPES) ×2 IMPLANT
DRAPE UTILITY XL STRL (DRAPES) ×3 IMPLANT
ELECT COATED BLADE 2.86 ST (ELECTRODE) ×3 IMPLANT
ELECTRODE BLDE 4.0 EZ CLN MEGD (MISCELLANEOUS) ×1 IMPLANT
ELECTRODE REM PT RTRN 9FT ADLT (ELECTROSURGICAL) ×3 IMPLANT
GAUZE PAD ABD 8X10 STRL (GAUZE/BANDAGES/DRESSINGS) ×2 IMPLANT
GAUZE SPONGE 4X4 12PLY STRL (GAUZE/BANDAGES/DRESSINGS) ×1 IMPLANT
GAUZE SPONGE 4X4 12PLY STRL LF (GAUZE/BANDAGES/DRESSINGS) ×3 IMPLANT
GLOVE BIO SURGEON STRL SZ 6 (GLOVE) ×3 IMPLANT
GLOVE BIOGEL PI IND STRL 6.5 (GLOVE) ×3 IMPLANT
GOWN STRL REUS W/ TWL LRG LVL3 (GOWN DISPOSABLE) ×3 IMPLANT
GOWN STRL REUS W/ TWL XL LVL3 (GOWN DISPOSABLE) ×3 IMPLANT
KIT MARKER MARGIN INK (KITS) ×3 IMPLANT
LIGHT WAVEGUIDE WIDE FLAT (MISCELLANEOUS) ×1 IMPLANT
NDL HYPO 25X1 1.5 SAFETY (NEEDLE) ×2 IMPLANT
NDL SAFETY ECLIPSE 18X1.5 (NEEDLE) ×3 IMPLANT
NEEDLE HYPO 25X1 1.5 SAFETY (NEEDLE) ×6 IMPLANT
NS IRRIG 1000ML POUR BTL (IV SOLUTION) ×3 IMPLANT
PACK BASIN DAY SURGERY FS (CUSTOM PROCEDURE TRAY) ×3 IMPLANT
PACK UNIVERSAL I (CUSTOM PROCEDURE TRAY) ×3 IMPLANT
PENCIL SMOKE EVACUATOR (MISCELLANEOUS) ×3 IMPLANT
SLEEVE SCD COMPRESS KNEE MED (STOCKING) ×3 IMPLANT
SPIKE FLUID TRANSFER (MISCELLANEOUS) ×2 IMPLANT
SPONGE T-LAP 18X18 ~~LOC~~+RFID (SPONGE) ×5 IMPLANT
STAPLER SKIN PROX WIDE 3.9 (STAPLE) ×1 IMPLANT
STOCKINETTE IMPERVIOUS LG (DRAPES) ×3 IMPLANT
STRIP CLOSURE SKIN 1/2X4 (GAUZE/BANDAGES/DRESSINGS) ×2 IMPLANT
SUT ETHILON 2 0 FS 18 (SUTURE) IMPLANT
SUT MNCRL AB 4-0 PS2 18 (SUTURE) ×3 IMPLANT
SUT MON AB 4-0 PC3 18 (SUTURE) ×2 IMPLANT
SUT MON AB 5-0 PS2 18 (SUTURE) IMPLANT
SUT SILK 2 0 SH (SUTURE) IMPLANT
SUT VIC AB 2-0 SH 18 (SUTURE) ×1 IMPLANT
SUT VIC AB 2-0 SH 27XBRD (SUTURE) ×3 IMPLANT
SUT VIC AB 3-0 SH 27X BRD (SUTURE) ×2 IMPLANT
SUT VICRYL 3-0 CR8 SH (SUTURE) ×2 IMPLANT
SYR BULB EAR ULCER 3OZ GRN STR (SYRINGE) ×3 IMPLANT
SYR CONTROL 10ML LL (SYRINGE) ×3 IMPLANT
TOWEL GREEN STERILE FF (TOWEL DISPOSABLE) ×3 IMPLANT
TRACER MAGTRACE VIAL (MISCELLANEOUS) ×1 IMPLANT
TRAY FAXITRON CT DISP (TRAY / TRAY PROCEDURE) ×5 IMPLANT
TUBE CONNECTING 20X1/4 (TUBING) ×3 IMPLANT
YANKAUER SUCT BULB TIP NO VENT (SUCTIONS) ×3 IMPLANT

## 2023-11-24 NOTE — Anesthesia Preprocedure Evaluation (Addendum)
**Note De-Identified [place] Obfuscation** Anesthesia Evaluation  Patient identified by MRN, date of birth, ID band Patient awake    Reviewed: Allergy  & Precautions, NPO status , Patient's Chart, lab work & pertinent test results  History of Anesthesia Complications (+) PONV, DIFFICULT AIRWAY and history of anesthetic complications (prior ACDF, glidescope used last procedure without issue)  Airway Mallampati: II  TM Distance: <3 FB Neck ROM: Limited  Mouth opening: Limited Mouth Opening  Dental no notable dental hx. (+) Teeth Intact, Dental Advisory Given   Pulmonary neg pulmonary ROS   Pulmonary exam normal        Cardiovascular hypertension, Pt. on medications and Pt. on home beta blockers Normal cardiovascular exam+ dysrhythmias (s/p ablation) Atrial Fibrillation      Neuro/Psych Tremor  negative psych ROS   GI/Hepatic Neg liver ROS,GERD  Medicated,,  Endo/Other  negative endocrine ROS    Renal/GU negative Renal ROS  negative genitourinary   Musculoskeletal  (+) Arthritis ,    Abdominal  (+) + obese  Peds  Hematology  (+) Blood dyscrasia, anemia Lab Results      Component                Value               Date                      WBC                      5.8                 10/28/2023                HGB                      12.6                10/28/2023                HCT                      38.6                10/28/2023                MCV                      88.7                10/28/2023                PLT                      211                 10/28/2023             Lab Results      Component                Value               Date                      NA                       137 **Note De-Identified [place] Obfuscation** 11/12/2023                K                        3.7                 11/12/2023                CO2                      25                  11/12/2023                GLUCOSE                  170 (H)             11/12/2023                BUN                       23                  11/12/2023                CREATININE               0.75                11/12/2023                CALCIUM                   8.8 (L)             11/12/2023                GFR                      89.56               08/20/2023                GFRNONAA                 >60                 11/12/2023              Anesthesia Other Findings   Reproductive/Obstetrics                             Anesthesia Physical Anesthesia Plan  ASA: 3  Anesthesia Plan: General and Regional   Post-op Pain Management: Minimal or no pain anticipated, Tylenol  PO (pre-op)* and Celebrex PO (pre-op)*   Induction: Intravenous  PONV Risk Score and Plan: 2 and Propofol  infusion, Treatment may vary due to age or medical condition, Ondansetron , Dexamethasone  and TIVA  Airway Management Planned: Oral ETT, Video Laryngoscope Planned and Mask  Additional Equipment: None  Intra-op Plan:   Post-operative Plan: Extubation in OR  Informed Consent: I have reviewed the patients History and Physical, chart, labs and discussed the procedure including the risks, benefits and alternatives for the proposed anesthesia with the patient or authorized representative who has indicated his/her understanding and acceptance.     Dental advisory given  Plan Discussed with: CRNA and Anesthesiologist **Note De-Identified [place] Obfuscation** Anesthesia Plan Comments: (PAT note by Rudy Costain, PA-C: 73 year old female with pertinent history including gastric bypass 2005, anemia, right breast cancer, GIB, HTN, paroxysmal A-fib/flutter s/p successful ablation 2015, GERD on PPI.  She  has a history of right intraductal papilloma previously resected in 2017.  She was recently diagnosed with right grade 2 IDC and had follow-up with oncologist Dr. Gudena on 10/28/2023.  It was discussed at that time that her breast surgery will be delayed due to upcoming cervical decompression and fusion surgery.  Therefore, she was  recommended to start anastrozole  1 mg daily.  History of prior C4-5 and C6-7 ACDF.  History of difficult airway.  Per anesthesia note 07/19/2020 from [facility], "Pt with very anterior airway, some difficulty with positioning ETT through cords d/t anterior a/w and small mouth opening."  Glide scope was used electively at that time.  TTE 11/24/2013: - Left ventricle: The cavity size was mildly dilated. Wall  thickness was normal. Systolic function was normal. The estimated  ejection fraction was in the range of 55% to 60%.  - Mitral valve: There was mild regurgitation.  - Left atrium: The atrium was moderately dilated.   )       Anesthesia Quick Evaluation

## 2023-11-24 NOTE — Discharge Instructions (Addendum)
**Note De-Identified [place] O[facility]uscation** [facility] [employer] Office Phone Number [phone]  BREAST BIOPSY/ PARTIAL MASTECTOMY: POST OP INSTRUCTIONS  Always review your discharge instruction sheet given to you by the facility where your surgery was performed.  IF YOU HAVE DISABILITY OR FAMILY LEAVE FORMS, YOU MUST BRING THEM TO THE OFFICE FOR PROCESSING.  DO NOT GIVE THEM TO YOUR DOCTOR.  Take 2 tylenol  (acetominophen) three times a day for 3 days.  If you still have pain, add ibuprofen with food in between if able to take this (if you have kidney issues or stomach issues, do not take ibuprofen).  If both of those are not enough, add the narcotic pain pill.  If you find you are needing a lot of this overnight after surgery, call the next morning for a refill.    Prescriptions will not be filled after 5pm or on week-ends. Take your usually prescribed medications unless otherwise directed You should eat very light the first 24 hours after surgery, such as soup, crackers, pudding, etc.  Resume your normal diet the day after surgery. Most patients will experience some swelling and bruising in the breast.  Ice packs and a good support bra will help.  Swelling and bruising can take several days to resolve.  It is common to experience some constipation if taking pain medication after surgery.  Increasing fluid intake and taking a stool softener will usually help or prevent this problem from occurring.  A mild laxative (Milk of Magnesia or Miralax) should be taken according to package directions if there are no bowel movements after 48 hours. Unless discharge instructions indicate otherwise, you may remove your bandages 48 hours after surgery, and you may shower at that time.  You may have steri-strips (small skin tapes) in place directly over the incision.  These strips should be left on the skin at least for for 7-10 days.    ACTIVITIES:  You may resume regular daily activities (gradually increasing) beginning the next day.  Wearing a  good support bra or sports bra (or the breast binder) minimizes pain and swelling.  You may have sexual intercourse when it is comfortable. No heavy lifting for 1-2 weeks (not over around 10 pounds).  You may drive when you no longer are taking prescription pain medication, you can comfortably wear a seatbelt, and you can safely maneuver your car and apply brakes. RETURN TO WORK:  __________3-14 days depending on job. _______________ Elene Griffes should see your doctor in the office for a follow-up appointment approximately two weeks after your surgery.  Your doctor's nurse will typically make your follow-up appointment when she calls you with your pathology report.  Expect your pathology report 3-4 business days after your surgery.  You may call to check if you do not hear from us  after three days.   WHEN TO CALL YOUR DOCTOR: Fever over 101.0 Nausea and/or vomiting. Extreme swelling or bruising. Continued bleeding from incision. Increased pain, redness, or drainage from the incision.  The clinic staff is available to answer your questions during regular business hours.  Please don't hesitate to call and ask to speak to one of the nurses for clinical concerns.  If you have a medical emergency, go to the nearest emergency room or call 911.  A surgeon from [facility] is always on call at the hospital.  For further questions, please visit [email or web address]    Post Anesthesia Home Care Instructions  Activity: Get plenty of rest for the remainder of the day. A responsible individual must stay **Note De-Identified [place] Obfuscation** with you for 24 hours following the procedure.  For the next 24 hours, DO NOT: -Drive a car -Advertising copywriter -Drink alcoholic beverages -Take any medication unless instructed by your physician -Make any legal decisions or sign important papers.  Meals: Start with liquid foods such as gelatin or soup. Progress to regular foods as tolerated. Avoid greasy, spicy, heavy foods. If nausea  and/or vomiting occur, drink only clear liquids until the nausea and/or vomiting subsides. Call your physician if vomiting continues.  Special Instructions/Symptoms: Your throat may feel dry or sore from the anesthesia or the breathing tube placed in your throat during surgery. If this causes discomfort, gargle with warm salt water. The discomfort should disappear within 24 hours.  If you had a scopolamine patch placed behind your ear for the management of post- operative nausea and/or vomiting:  1. The medication in the patch is effective for 72 hours, after which it should be removed.  Wrap patch in a tissue and discard in the trash. Wash hands thoroughly with soap and water. 2. You may remove the patch earlier than 72 hours if you experience unpleasant side effects which may include dry mouth, dizziness or visual disturbances. 3. Avoid touching the patch. Wash your hands with soap and water after contact with the patch.    Next dose of tylenol  if needed is after 1PM

## 2023-11-24 NOTE — Anesthesia Procedure Notes (Addendum)
**Note De-Identified [place] Obfuscation** Procedure Name: Intubation Date/Time: 11/24/2023 9:06 AM  Performed by: Eugenia Hess, CRNAPre-anesthesia Checklist: Patient identified, Emergency Drugs available, Suction available and Patient being monitored Patient Re-evaluated:Patient Re-evaluated prior to induction Oxygen Delivery Method: Circle system utilized Preoxygenation: Pre-oxygenation with 100% oxygen Induction Type: IV induction Ventilation: Mask ventilation without difficulty Laryngoscope Size: Mac, Glidescope and 3 Grade View: Grade I Tube type: Oral Tube size: 7.0 mm Number of attempts: 1 Airway Equipment and Method: Stylet and Oral airway Placement Confirmation: ETT inserted through vocal cords under direct vision, positive ETCO2 and breath sounds checked- equal and bilateral Secured at: 22 cm Tube secured with: Tape Dental Injury: Teeth and Oropharynx as per pre-operative assessment  Difficulty Due To: Difficulty was anticipated, Difficult Airway-  due to neck instability and Difficult Airway- due to cervical collar

## 2023-11-24 NOTE — Anesthesia Postprocedure Evaluation (Signed)
**Note De-Identified [place]m Obfuscation** Anesthesia Post Note  Patient: Felicia Schultz  Procedure(s) Performed: RADIOACTIVE SEED GUIDED RIGHT BREAST LUMPECTOMY TIMES TWO WITH MARGINS (Right: Breast) RADIOACTIVE SEED GUIDED RIGHT AXILLARY SENTINEL NODE BIOPSY, RIGHT AXILLARY SENTINEL LYMPH NODE BIOPSIES (Right: Axilla)     Patient location during evaluation: PACU Anesthesia Type: Regional and General Level of consciousness: awake and alert Pain management: pain level controlled Vital Signs Assessment: post-procedure vital signs reviewed and stable Respiratory status: spontaneous breathing, nonlabored ventilation, respiratory function stable and patient connected to nasal cannula oxygen Cardiovascular status: blood pressure returned to baseline and stable Postop Assessment: no apparent nausea or vomiting Anesthetic complications: no   No notable events documented.  Last Vitals:  Vitals:   11/24/23 1205 11/24/23 1237  BP:  (!) 140/73  Pulse:  62  Resp:  17  Temp:  (!) 36.1 C  SpO2: 92% 93%    Last Pain:  Vitals:   11/24/23 1237  TempSrc: Temporal  PainSc:                  Valente Gaskin Rosco Harriott

## 2023-11-24 NOTE — Interval H&P Note (Signed)
**Note De-Identified [place] Obfuscation** History and Physical Interval Note:  11/24/2023 8:36 AM  Felicia Schultz  has presented today for surgery, with the diagnosis of RIGHT BREAST CANCER AND RIGHT BREAST PAPILLOMA.  The various methods of treatment have been discussed with the patient and family. After consideration of risks, benefits and other options for treatment, the patient has consented to  Procedure(s): BREAST LUMPECTOMY WITH RADIOACTIVE SEED LOCALIZATION (Right) EXCISION, MASS, BREAST, USING RADIOLOGICAL MARKER (Right) BIOPSY, LYMPH NODE, SENTINEL (Right) as a surgical intervention.  The patient's history has been reviewed, patient examined, no change in status, stable for surgery.  I have reviewed the patient's chart and labs.  Questions were answered to the patient's satisfaction.     Lockie Rima

## 2023-11-24 NOTE — Op Note (Signed)
**Note De-Identified [place] Obfuscation** Right breast Radioactive seed localized lumpectomy, right breast seed localized excisional biopsy, right breast seed targeted deep axillary lymph node excision and , right axillary sentinel lymph node mapping and biopsy  Indications: This patient presents with history of right breast cancer, upper outer quadrant cT1cN1 grade 2 invasive ductal carcinoma, ER+/PR+/Her2-, Ki 67 5%, right breast intraductal papilloma  Pre-operative Diagnosis: right breast cancer, right breast papilloma  Post-operative Diagnosis: Same  Surgeon: Lockie Rima   Assistant: n/a  Anesthesia: General endotracheal anesthesia  ASA Class: 3  Procedure Details  The patient was seen in the Holding Room. The risks, benefits, complications, treatment options, and expected outcomes were discussed with the patient. The possibilities of bleeding, infection, the need for additional procedures, failure to diagnose a condition, and creating a complication requiring transfusion or operation were discussed with the patient. The patient concurred with the proposed plan, giving informed consent.  The site of surgery properly noted/marked. The patient was taken to Operating Room # 8, identified, and the procedure verified as right Breast Seed localized Lumpectomy, right breast seed targeted excisional biopsy, right breast seed targeted lymph node excisional biopsy,  with sentinel lymph node biopsy. The right arm, breast, and chest were prepped and draped in standard fashion. A Time Out was held and the above information confirmed. The MagTrace was injected into the subareolar position and massaged for 5 minutes.  The patient's breast had previously been marked by plastic surgery for delayed oncoplastic reduction.   The lumpectomy was performed by creating a curvilinear transverse incision near the previously placed radioactive seeds within the borders defined by plastic surgery.  Dissection was carried down to around the point of maximum  signal intensity for the specimen 6 cm from the nipple first.. The cautery was used to perform the dissection.  Specimen radiograph was obtained after marking the specimen with the margin marker paint kit.  There were several biopsy clips and 1 seed in the specimen.  1 of these clips was definitely the heart-shaped clip which was the clip associated with the malignant specimen.  The second seed was close to this cavity and to the second excisional specimen was taken contiguous with the first as the inferior border to the first specimen.  This was also taken with the cautery.    The specimen was inked with the margin marker paint kit.    Specimen radiography confirmed inclusion of the additional clip and the seed.  There was an additional biopsy clip present here as well.  Radiology was contacted to confirm that the specific clips that we wanted were and the specimens given that the patient had had a lot of previous biopsies.  They did confirm that we had the heart shaped clip and the coil clip between the 2 specimens.  The background signal in the breast was zero.    Additional margins were taken at the six cardinal directional margin(s).  Again, specimen #2 was directly inferior to specimen #1.  Specimen #1 was the malignant specimen going into this and specimen #2 was the papilloma specimen.  The wound was irrigated and reinspected for hemostasis.   The specimen was the the edges of the cavity were marked with large clips.  The skin was then closed with 3-0 vicryl in layers and 4-0 monocryl subcuticular suture.    Using a Neoprobe, right axillary sentinel nodes were identified transcutaneously.  An oblique incision was created below the axillary hairline.  Dissection was carried through the clavipectoral fascia.  A palpable node was **Note De-Identified Steely Obfuscation** immediately identified.  This was grasped with the plastic Babcock.  The lymphovascular channels were clipped with hemoclips.  The cautery was used to dissect this away from  the adjacent tissues.  The radioactive seed was located in this specimen.  There was no mag trace uptake in this lymph node.  There were 2 additional deep level 2 lymph nodes taken.  Counts per second or 50 and 190.  The second nodal specimen did appear to have 2 small lymph nodes stuck together.  The wound was irrigated.  Hemostasis was achieved with cautery.  The axillary incision was closed with a 3-0 vicryl deep dermal interrupted sutures and a 4-0 monocryl subcuticular closure.    Sterile dressings were applied. At the end of the operation, all sponge, instrument, and needle counts were correct.  Findings: grossly clear surgical margins and no adenopathy, anterior margin is skin, posterior margin is pectoralis.  Specimen #2 is the direct inferior margin of the specimen #1  Estimated Blood Loss:  min         Specimens:  Right breast tissue with seed and heart-shaped clip, 6 cm from nipple Right breast tissue with seed and coil shaped clip, 2 cm from nipple Additional margins including anterior, medial, inferior, lateral, superior, anterior, posterior Right axillary sentinel lymph node #1 with seed Right axillary sentinel lymph node #2  Right axillary sentinel lymph node #3         Complications:  None; patient tolerated the procedure well.         Disposition: PACU - hemodynamically stable.         Condition: stable

## 2023-11-24 NOTE — Anesthesia Procedure Notes (Signed)
**Note De-Identified [place] [facility]bfuscation** Anesthesia Regional Block: Pectoralis block   Pre-Anesthetic Checklist: , timeout performed,  Correct Patient, Correct Site, Correct Laterality,  Correct Procedure, Correct Position, site marked,  Risks and benefits discussed,  Surgical consent,  Pre-op evaluation,  At surgeon's request and post-op pain management  Laterality: Right  Prep: Dura Prep       Needles:  Injection technique: Single-shot  Needle Type: Echogenic Stimulator Needle     Needle Length: 5cm  Needle Gauge: 20     Additional Needles:   Procedures:,,,, ultrasound used (permanent image in chart),,    Narrative:  Start time: 11/24/2023 7:30 AM End time: 11/24/2023 7:33 AM Injection made incrementally with aspirations every 5 mL.  Performed by: Personally  Anesthesiologist: Micheal Agent, DO  Additional Notes: Patient identified. Risks/Benefits/Options discussed with patient including but not limited to bleeding, infection, nerve damage, failed block, incomplete pain control. Patient expressed understanding and wished to proceed. All questions were answered. Sterile technique was used throughout the entire procedure. Please see nursing notes for vital signs. Aspirated in 5cc intervals with injection for negative confirmation. Patient was given instructions on fall risk and not to get out of bed. All questions and concerns addressed with instructions to call with any issues or inadequate analgesia.

## 2023-11-24 NOTE — Progress Notes (Signed)
Assisted Dr. Greg Stoltzfus with right, pectoralis, ultrasound guided block. Side rails up, monitors on throughout procedure. See vital signs in flow sheet. Tolerated Procedure well. 

## 2023-11-24 NOTE — Transfer of Care (Signed)
**Note De-Identified [place] Obfuscation** Immediate Anesthesia Transfer of Care Note  Patient: Felicia Schultz  Procedure(s) Performed: RADIOACTIVE SEED GUIDED RIGHT BREAST LUMPECTOMY TIMES TWO WITH MARGINS (Right: Breast) RADIOACTIVE SEED GUIDED RIGHT AXILLARY SENTINEL NODE BIOPSY, RIGHT AXILLARY SENTINEL LYMPH NODE BIOPSIES (Right: Axilla)  Patient Location: PACU  Anesthesia Type:General  Level of Consciousness: awake, alert , oriented, drowsy, and patient cooperative  Airway & Oxygen Therapy: Patient Spontanous Breathing and Patient connected to face mask oxygen  Post-op Assessment: Report given to RN and Post -op Vital signs reviewed and stable  Post vital signs: Reviewed and stable  Last Vitals:  Vitals Value Taken Time  BP    Temp    Pulse    Resp    SpO2      Last Pain:  Vitals:   11/24/23 0711  TempSrc: Temporal  PainSc: 0-No pain      Patients Stated Pain Goal: 3 (11/24/23 0711)  Complications: No notable events documented.

## 2023-11-25 ENCOUNTER — Ambulatory Visit: Payer: Self-pay | Admitting: General Surgery

## 2023-11-25 ENCOUNTER — Encounter (HOSPITAL_BASED_OUTPATIENT_CLINIC_OR_DEPARTMENT_OTHER): Payer: Self-pay | Admitting: General Surgery

## 2023-11-25 LAB — SURGICAL PATHOLOGY

## 2023-11-26 ENCOUNTER — Encounter: Payer: Self-pay | Admitting: *Deleted

## 2023-11-26 ENCOUNTER — Encounter (HOSPITAL_BASED_OUTPATIENT_CLINIC_OR_DEPARTMENT_OTHER): Payer: Self-pay | Admitting: Plastic Surgery

## 2023-11-26 NOTE — Progress Notes (Signed)
**Note De-identified [place]waites Obfuscation**   **Note De-Identified [place]ke Obfuscation** 11/26/23 1100  PAT Phone Screen  Is the patient taking a GLP-1 receptor agonist? No  Do You Have Diabetes? No  Do You Have Hypertension? (S)  No (takes betablocker for tremors PRN)  Have You Ever Been to the ER for Asthma? No  Have You Taken Oral Steroids in the Past 3 Months? No  Do you Take Phenteramine or any Other Diet Drugs? No  Recent  Lab Work, EKG, CXR? No  Do you have a history of heart problems? No  Pat Appointment Scheduled (S)  Yes (coming for anesthesia consult for airway check and EKG pt states she had some afib during anesthesia last time.)

## 2023-11-28 NOTE — H&P (Signed)
**Note De-Identified [place] Obfuscation** Subjective Patient ID: Felicia Schultz is a 73 y.o. female.     HPI   Returns for follow up discussion oncoplastic breast reconstruction. Presented with palpable right breast mass. MMG/US  showed right breast cystic and solid mass at 12 o'clock, 2 cmfn, with linearly arranged area of abnormal tissue/masses extending for 3 cm posterosuperior to the mass, the more peripheral of which represents a discrete 1.3 cm mass at 12 o'clock, 6 cmfn. Single abnormal right axillary LN present. Biopsies labeled right breast 12 oclock 2 cmfn papilloma, right breast 12 o clock 6 cmfn IDC, ER/PR+, Her2 -. LN positive for metastatic disease.    Oncotype sent on biopsy, 2. Staging scans negative for distant disease Anastrozole  started as breast treatment delayed secondary previously scheduled ACDF surgery.    Patient has had prior right breast excision benign papilloma and Dr. Cherlynn Cornfield has discussed that there will be more asymmetry following lumpectomy.   Patient underwent lumpectomy SLN with final pathology 0.5 cm IDC arising and associated with intraductal papilloma, margins clear, 1/3 SLN +.   Current 36 C, patient states this cup fits both breasts equally.   PMH significant for vocal cord paresis, atrial fibrillation post ablation, gastric bypass 2005. Highest weight 263 lb, lost 150 lb following bypass with some weight regain over last 4 years.   Lives with spouse.   Review of Systems  All other systems reviewed and are negative.     Objective Physical Exam  Cardiovascular: Normal rate, regular rhythm and normal heart sounds.    Pulmonary/Chest Effort normal and breath sounds normal.    Skin   Fitzpatrick 2    Lymph: no palpable axillary adenopathy   Breasts: palpable right areolar mass (papilloma), right areolar scar SN to nipple R 28.5 L 29 cm BW R 20 L 20 cm Nipple to IMF R 11 L 12 cm   Assessment/Plan Malignant neoplasm of upper-outer quadrant of right breast in female, estrogen  receptor positive  s/p lumpectomy SLN     Plan staged oncoplastic reconstruction 7-10 d post lumpectomy to ensure pathologic clearance. Reviewed mastopexy with anchor type scars, drains, post operative visits and limitations, recovery. Diminished sensation nipple and breast skin, risk of nipple loss, wound healing problems, asymmetry. Discussed will have some contraction of breast volume and increased firmness with radiation, less ptosis with aging. This can result in asymmetries long term. Discussed changes with wt gain, loss, aging. Discussed lumpectomy alone can result in NAC displacement, distortion contour breast following lumpectomy and RT, asymmetry breast volume and NAC position. Reviewed purpose of this type reconstruction to prevent these. Reviewed breast lift or trying to correct NAC displacement post RT more difficult. Counseled I cannot assure her cup size.  Reviewed can defer surgery until after therapies complete. In this setting would wait at least 6 months from end RT for any surgery. Reviewed increased risks complications in setting RT. Reviewed any complications from oncoplastic reconstruction procedure may delay start adjuvant therapies.    We discussed volume reducing vs volume preservation techniques. She has small soft tissue volume in distribution LICAP flap and do not feel this could reliably reach anticipated lumpectomy defect location. TAP flap may have more reach and volume with additional donor site back. Following discussion patient agrees to volume reduction and plan bilateral mastopexy.    Additional risks including but not limited to bleeding, hematoma, seroma, need for additional procedures, infection, damage to adjacent structures, blood clots in legs or lungs reviewed. Completed ASPS consent for mastopexy. **Note De-Identified Kassis Obfuscation** Drain teaching completed. Will give any needed Rx on day of surgery.  Alger Infield, MD [facility]  Office/ physician access line  after hours [phone]

## 2023-11-30 ENCOUNTER — Encounter (HOSPITAL_BASED_OUTPATIENT_CLINIC_OR_DEPARTMENT_OTHER)
Admission: RE | Admit: 2023-11-30 | Discharge: 2023-11-30 | Disposition: A | Discharge status: Home / Self Care | Source: Ambulatory Visit | Attending: Plastic Surgery | Admitting: Plastic Surgery

## 2023-11-30 DIAGNOSIS — Z01818 Encounter for other preprocedural examination: Secondary | ICD-10-CM | POA: Diagnosis present

## 2023-11-30 DIAGNOSIS — Z01810 Encounter for preprocedural cardiovascular examination: Secondary | ICD-10-CM | POA: Insufficient documentation

## 2023-11-30 MED ORDER — CHLORHEXIDINE GLUCONATE CLOTH 2 % EX PADS
6.0000 | MEDICATED_PAD | Freq: Once | CUTANEOUS | Status: DC
Start: 2023-11-30 — End: 2023-12-04

## 2023-11-30 MED ORDER — CHLORHEXIDINE GLUCONATE CLOTH 2 % EX PADS: 6.0000 | MEDICATED_PAD | Freq: Once | CUTANEOUS | Status: DC

## 2023-11-30 NOTE — Anesthesia Preprocedure Evaluation (Addendum)
**Note De-Identified Lomeli Obfuscation** Anesthesia Evaluation  Patient identified by MRN, date of birth, ID band Patient awake    Reviewed: Allergy  & Precautions, NPO status , Patient's Chart, lab work & pertinent test results  History of Anesthesia Complications (+) PONV, DIFFICULT AIRWAY and history of anesthetic complications  Airway Mallampati: III  TM Distance: >3 FB Neck ROM: Full  Mouth opening: Limited Mouth Opening Comment: In soft collar Dental no notable dental hx. (+) Teeth Intact, Dental Advisory Given   Pulmonary neg pulmonary ROS   Pulmonary exam normal breath sounds clear to auscultation       Cardiovascular hypertension, (-) angina (-) Past MI Normal cardiovascular exam Rhythm:Regular Rate:Normal     Neuro/Psych negative neurological ROS     GI/Hepatic ,GERD  Medicated and Controlled,,  Endo/Other    Renal/GU      Musculoskeletal   Abdominal   Peds  Hematology   Anesthesia Other Findings All: Nsaids, Sulfa, Nitrofurantoin, clarithromycin, Azithromycin , Amoxicillin   Reproductive/Obstetrics                             Anesthesia Physical Anesthesia Plan  ASA: 2  Anesthesia Plan: General   Post-op Pain Management: Precedex and Ofirmev  IV (intra-op)*   Induction: Intravenous  PONV Risk Score and Plan: 4 or greater and Treatment may vary due to age or medical condition, Ondansetron , Propofol  infusion, TIVA and Midazolam   Airway Management Planned: Video Laryngoscope Planned and Oral ETT  Additional Equipment: None  Intra-op Plan:   Post-operative Plan: Extubation in OR  Informed Consent: I have reviewed the patients History and Physical, chart, labs and discussed the procedure including the risks, benefits and alternatives for the proposed anesthesia with the patient or authorized representative who has indicated his/her understanding and acceptance.     Dental advisory given  Plan Discussed with:  CRNA and Surgeon  Anesthesia Plan Comments: (Chart reviewed preop with RN- ACDF in May 2025, still in C collar until f/u appt the week of the 23rd. Subsequently had lumpectomy done at day surgery with ETT (glidescope, no issues). Per pt, was told she was in and out of Afib (has hx Afib, but no recurrences since ablation in 2016). I see no documentation of Afib during her last surgery (documentation of frequent PACs, which is also what her preop EKG shows today) so I am unsure who told her she was in and out of Afib. Cleared for day surgery again. -Theran Vandergrift  TIVA)       Anesthesia Quick Evaluation

## 2023-11-30 NOTE — Progress Notes (Signed)

## 2023-12-01 ENCOUNTER — Ambulatory Visit: Admitting: Dermatology

## 2023-12-01 ENCOUNTER — Encounter: Payer: Self-pay | Admitting: Dermatology

## 2023-12-01 VITALS — BP 109/63 | HR 73

## 2023-12-01 DIAGNOSIS — D492 Neoplasm of unspecified behavior of bone, soft tissue, and skin: Secondary | ICD-10-CM

## 2023-12-01 DIAGNOSIS — Q859 Phakomatosis, unspecified: Secondary | ICD-10-CM

## 2023-12-01 DIAGNOSIS — D485 Neoplasm of uncertain behavior of skin: Secondary | ICD-10-CM

## 2023-12-01 DIAGNOSIS — Z85828 Personal history of other malignant neoplasm of skin: Secondary | ICD-10-CM

## 2023-12-01 NOTE — Progress Notes (Signed)
**Note De-identified Underberg Obfuscation**   **Note De-Identified Traxler Obfuscation** New Patient Visit   Subjective  Felicia Schultz is a 73 y.o. female who presents for the following: growth Pt has a growth on the nose to be evaluated. She has a hx of skin cancer in 1985, BCC, repaired with flap. She says lesions on nose has bled at times. She is currently undergoing care for breast cancer.   The following portions of the chart were reviewed this encounter and updated as appropriate: medications, allergies, medical history  Review of Systems:  No other skin or systemic complaints except as noted in HPI or Assessment and Plan.  Objective  Well appearing patient in no apparent distress; mood and affect are within normal limits.   A focused examination was performed of the following areas: nose  Relevant exam findings are noted in the Assessment and Plan.  Mid Tip of Nose 6mm pearly white/flesh colored nodule   Assessment & Plan   NEOPLASM OF UNCERTAIN BEHAVIOR OF SKIN Mid Tip of Nose Skin / nail biopsy Type of biopsy: tangential   Informed consent: discussed and consent obtained   Timeout: patient name, date of birth, surgical site, and procedure verified   Procedure prep:  Patient was prepped and draped in usual sterile fashion Prep type:  Isopropyl alcohol Anesthesia: the lesion was anesthetized in a standard fashion   Anesthetic:  1% lidocaine  w/ epinephrine  1-100,000 buffered w/ 8.4% NaHCO3 Instrument used: DermaBlade   Hemostasis achieved with: pressure and aluminum chloride   Outcome: patient tolerated procedure well   Post-procedure details: sterile dressing applied and wound care instructions given   Dressing type: petrolatum gauze and pressure dressing   Specimen 1 - Surgical pathology Differential Diagnosis: R/O NMSC vs other  Check Margins: No  HISTORY OF BASAL CELL CARCINOMA OF THE SKIN - No evidence of recurrence today - Recommend regular full body skin exams - Recommend daily broad spectrum sunscreen SPF 30+ to sun-exposed areas, reapply  every 2 hours as needed.  - Call if any new or changing lesions are noted between office visits  Return for TBSE.  I, Wilson Hasten, CMA, am acting as scribe for Deneise Finlay, MD.   Documentation: I have reviewed the above documentation for accuracy and completeness, and I agree with the above.  Deneise Finlay, MD

## 2023-12-01 NOTE — Patient Instructions (Addendum)

## 2023-12-03 ENCOUNTER — Encounter (HOSPITAL_BASED_OUTPATIENT_CLINIC_OR_DEPARTMENT_OTHER)
Admission: RE | Disposition: A | Discharge status: Home / Self Care | Payer: Self-pay | Source: Home / Self Care | Attending: Plastic Surgery

## 2023-12-03 ENCOUNTER — Ambulatory Visit (HOSPITAL_BASED_OUTPATIENT_CLINIC_OR_DEPARTMENT_OTHER): Payer: Self-pay | Admitting: Anesthesiology

## 2023-12-03 ENCOUNTER — Other Ambulatory Visit: Payer: Self-pay

## 2023-12-03 ENCOUNTER — Encounter (HOSPITAL_BASED_OUTPATIENT_CLINIC_OR_DEPARTMENT_OTHER): Payer: Self-pay | Admitting: Plastic Surgery

## 2023-12-03 ENCOUNTER — Ambulatory Visit: Payer: Self-pay | Admitting: Dermatology

## 2023-12-03 ENCOUNTER — Ambulatory Visit (HOSPITAL_BASED_OUTPATIENT_CLINIC_OR_DEPARTMENT_OTHER)
Admission: RE | Admit: 2023-12-03 | Discharge: 2023-12-03 | Disposition: A | Discharge status: Home / Self Care | Attending: Plastic Surgery | Admitting: Plastic Surgery

## 2023-12-03 DIAGNOSIS — Z1732 Human epidermal growth factor receptor 2 negative status: Secondary | ICD-10-CM | POA: Diagnosis not present

## 2023-12-03 DIAGNOSIS — Z9884 Bariatric surgery status: Secondary | ICD-10-CM | POA: Insufficient documentation

## 2023-12-03 DIAGNOSIS — Z170 Estrogen receptor positive status [ER+]: Secondary | ICD-10-CM

## 2023-12-03 DIAGNOSIS — C50411 Malignant neoplasm of upper-outer quadrant of right female breast: Secondary | ICD-10-CM | POA: Diagnosis present

## 2023-12-03 DIAGNOSIS — I4891 Unspecified atrial fibrillation: Secondary | ICD-10-CM | POA: Diagnosis not present

## 2023-12-03 DIAGNOSIS — I491 Atrial premature depolarization: Secondary | ICD-10-CM | POA: Insufficient documentation

## 2023-12-03 DIAGNOSIS — Z1721 Progesterone receptor positive status: Secondary | ICD-10-CM | POA: Insufficient documentation

## 2023-12-03 DIAGNOSIS — Z01818 Encounter for other preprocedural examination: Secondary | ICD-10-CM

## 2023-12-03 DIAGNOSIS — N6315 Unspecified lump in the right breast, overlapping quadrants: Secondary | ICD-10-CM | POA: Insufficient documentation

## 2023-12-03 HISTORY — PX: MASTOPEXY: SHX5358

## 2023-12-03 LAB — SURGICAL PATHOLOGY

## 2023-12-03 SURGERY — MASTOPEXY
Anesthesia: General | Site: Breast | Laterality: Bilateral

## 2023-12-03 MED ORDER — LACTATED RINGERS IV SOLN: INTRAVENOUS | Status: DC

## 2023-12-03 MED ORDER — HYDROMORPHONE HCL 1 MG/ML IJ SOLN
INTRAMUSCULAR | Status: AC
  Filled 2023-12-03: qty 0.5

## 2023-12-03 MED ORDER — PROPOFOL 500 MG/50ML IV EMUL
INTRAVENOUS | Status: DC | PRN
Start: 2023-12-03 — End: 2023-12-03

## 2023-12-03 MED ORDER — ONDANSETRON HCL 4 MG/2ML IJ SOLN
INTRAMUSCULAR | Status: AC
  Filled 2023-12-03: qty 6

## 2023-12-03 MED ORDER — DEXMEDETOMIDINE HCL IN NACL 80 MCG/20ML IV SOLN
INTRAVENOUS | Status: DC | PRN
Start: 2023-12-03 — End: 2023-12-03

## 2023-12-03 MED ORDER — HYDROMORPHONE HCL 1 MG/ML IJ SOLN
0.2500 mg | INTRAMUSCULAR | Status: DC | PRN
  Administered 2023-12-03: 0.25 mg via INTRAVENOUS

## 2023-12-03 MED ORDER — GABAPENTIN 300 MG PO CAPS
ORAL_CAPSULE | ORAL | Status: AC
  Filled 2023-12-03: qty 1

## 2023-12-03 MED ORDER — PROPOFOL 10 MG/ML IV BOLUS: INTRAVENOUS | Status: DC | PRN

## 2023-12-03 MED ORDER — ACETAMINOPHEN 10 MG/ML IV SOLN: 1000.0000 mg | Freq: Once | INTRAVENOUS | Status: DC | PRN

## 2023-12-03 MED ORDER — LIDOCAINE 2% (20 MG/ML) 5 ML SYRINGE
INTRAMUSCULAR | Status: AC
  Filled 2023-12-03: qty 15

## 2023-12-03 MED ORDER — GABAPENTIN 300 MG PO CAPS: 300.0000 mg | ORAL_CAPSULE | Freq: Three times a day (TID) | ORAL | 2 refills | Status: AC | PRN

## 2023-12-03 MED ORDER — PHENYLEPHRINE 80 MCG/ML (10ML) SYRINGE FOR IV PUSH (FOR BLOOD PRESSURE SUPPORT): PREFILLED_SYRINGE | INTRAVENOUS | Status: DC | PRN

## 2023-12-03 MED ORDER — 0.9 % SODIUM CHLORIDE (POUR BTL) OPTIME
TOPICAL | Status: DC | PRN
  Administered 2023-12-03: 200 mL

## 2023-12-03 MED ORDER — DEXAMETHASONE SODIUM PHOSPHATE 10 MG/ML IJ SOLN
INTRAMUSCULAR | Status: DC | PRN
Start: 2023-12-03 — End: 2023-12-03

## 2023-12-03 MED ORDER — ROCURONIUM BROMIDE 10 MG/ML (PF) SYRINGE
PREFILLED_SYRINGE | INTRAVENOUS | Status: AC
  Filled 2023-12-03: qty 30

## 2023-12-03 MED ORDER — AMISULPRIDE (ANTIEMETIC) 5 MG/2ML IV SOLN: 10.0000 mg | Freq: Once | INTRAVENOUS | Status: DC | PRN

## 2023-12-03 MED ORDER — MIDAZOLAM HCL 2 MG/2ML IJ SOLN: INTRAMUSCULAR | Status: DC | PRN

## 2023-12-03 MED ORDER — OXYCODONE HCL 5 MG PO TABS
5.0000 mg | ORAL_TABLET | Freq: Once | ORAL | Status: AC | PRN
  Administered 2023-12-03: 5 mg via ORAL

## 2023-12-03 MED ORDER — PROPOFOL 500 MG/50ML IV EMUL
INTRAVENOUS | Status: AC
  Filled 2023-12-03: qty 50

## 2023-12-03 MED ORDER — ROCURONIUM BROMIDE 10 MG/ML (PF) SYRINGE: PREFILLED_SYRINGE | INTRAVENOUS | Status: DC | PRN

## 2023-12-03 MED ORDER — OXYCODONE HCL 5 MG/5ML PO SOLN: 5.0000 mg | Freq: Once | ORAL | Status: AC | PRN

## 2023-12-03 MED ORDER — FENTANYL CITRATE (PF) 100 MCG/2ML IJ SOLN
INTRAMUSCULAR | Status: AC
Start: 2023-12-03 — End: 2023-12-03
  Filled 2023-12-03: qty 2

## 2023-12-03 MED ORDER — MIDAZOLAM HCL 2 MG/2ML IJ SOLN
INTRAMUSCULAR | Status: AC
  Filled 2023-12-03: qty 2

## 2023-12-03 MED ORDER — HYDROMORPHONE HCL 1 MG/ML IJ SOLN: INTRAMUSCULAR | Status: DC | PRN

## 2023-12-03 MED ORDER — GABAPENTIN 300 MG PO CAPS
300.0000 mg | ORAL_CAPSULE | ORAL | Status: AC
Start: 2023-12-03 — End: 2023-12-03
  Administered 2023-12-03: 300 mg via ORAL

## 2023-12-03 MED ORDER — CEFAZOLIN SODIUM-DEXTROSE 2-4 GM/100ML-% IV SOLN
INTRAVENOUS | Status: AC
  Filled 2023-12-03: qty 100

## 2023-12-03 MED ORDER — LIDOCAINE 2% (20 MG/ML) 5 ML SYRINGE: INTRAMUSCULAR | Status: DC | PRN

## 2023-12-03 MED ORDER — ONDANSETRON HCL 4 MG/2ML IJ SOLN: INTRAMUSCULAR | Status: DC | PRN

## 2023-12-03 MED ORDER — DEXAMETHASONE SODIUM PHOSPHATE 10 MG/ML IJ SOLN
INTRAMUSCULAR | Status: AC
  Filled 2023-12-03: qty 3

## 2023-12-03 MED ORDER — ACETAMINOPHEN 500 MG PO TABS
1000.0000 mg | ORAL_TABLET | ORAL | Status: AC
  Administered 2023-12-03: 1000 mg via ORAL

## 2023-12-03 MED ORDER — BUPIVACAINE HCL (PF) 0.5 % IJ SOLN
INTRAMUSCULAR | Status: AC
  Filled 2023-12-03: qty 30

## 2023-12-03 MED ORDER — PHENYLEPHRINE HCL-NACL 20-0.9 MG/250ML-% IV SOLN: INTRAVENOUS | Status: DC | PRN

## 2023-12-03 MED ORDER — ONDANSETRON HCL 4 MG/2ML IJ SOLN: 4.0000 mg | Freq: Once | INTRAMUSCULAR | Status: DC | PRN

## 2023-12-03 MED ORDER — FENTANYL CITRATE (PF) 100 MCG/2ML IJ SOLN: INTRAMUSCULAR | Status: DC | PRN

## 2023-12-03 MED ORDER — BUPIVACAINE HCL (PF) 0.5 % IJ SOLN
INTRAMUSCULAR | Status: DC | PRN
  Administered 2023-12-03: 30 mL

## 2023-12-03 MED ORDER — SUGAMMADEX SODIUM 200 MG/2ML IV SOLN
INTRAVENOUS | Status: DC | PRN
  Administered 2023-12-03: 180 mg via INTRAVENOUS

## 2023-12-03 MED ORDER — CEFAZOLIN SODIUM-DEXTROSE 2-3 GM-%(50ML) IV SOLR: INTRAVENOUS | Status: DC | PRN

## 2023-12-03 MED ORDER — EPHEDRINE 5 MG/ML INJ
INTRAVENOUS | Status: AC
  Filled 2023-12-03: qty 15

## 2023-12-03 MED ORDER — OXYCODONE HCL 5 MG PO TABS
ORAL_TABLET | ORAL | Status: AC
  Filled 2023-12-03: qty 1

## 2023-12-03 MED ORDER — CEFAZOLIN SODIUM-DEXTROSE 2-4 GM/100ML-% IV SOLN
2.0000 g | INTRAVENOUS | Status: DC
Start: 2023-12-03 — End: 2023-12-04

## 2023-12-03 MED ORDER — ALBUMIN HUMAN 5 % IV SOLN: INTRAVENOUS | Status: DC | PRN

## 2023-12-03 MED ORDER — ACETAMINOPHEN 500 MG PO TABS
ORAL_TABLET | ORAL | Status: AC
Start: 2023-12-03 — End: 2023-12-03
  Filled 2023-12-03: qty 2

## 2023-12-03 SURGICAL SUPPLY — 54 items
BAG DECANTER FOR FLEXI CONT (MISCELLANEOUS) ×1 IMPLANT
BINDER BREAST LRG (GAUZE/BANDAGES/DRESSINGS) IMPLANT
BINDER BREAST MEDIUM (GAUZE/BANDAGES/DRESSINGS) IMPLANT
BINDER BREAST XLRG (GAUZE/BANDAGES/DRESSINGS) IMPLANT
BINDER BREAST XXLRG (GAUZE/BANDAGES/DRESSINGS) IMPLANT
BLADE SURG 10 STRL SS (BLADE) ×1 IMPLANT
BLADE SURG 15 STRL LF DISP TIS (BLADE) IMPLANT
BNDG GAUZE DERMACEA FLUFF 4 (GAUZE/BANDAGES/DRESSINGS) ×2 IMPLANT
CANISTER SUCT 1200ML W/VALVE (MISCELLANEOUS) ×1 IMPLANT
CHLORAPREP W/TINT 26 (MISCELLANEOUS) ×1 IMPLANT
COVER MAYO STAND STRL (DRAPES) ×1 IMPLANT
DERMABOND ADVANCED .7 DNX12 (GAUZE/BANDAGES/DRESSINGS) ×1 IMPLANT
DRAIN CHANNEL 15F RND FF W/TCR (WOUND CARE) IMPLANT
DRAIN CHANNEL 19F RND (DRAIN) IMPLANT
DRAPE TOP ARMCOVERS (MISCELLANEOUS) ×1 IMPLANT
DRAPE U-SHAPE 76X120 STRL (DRAPES) ×1 IMPLANT
DRAPE UTILITY XL STRL (DRAPES) ×1 IMPLANT
DRSG TEGADERM 2-3/8X2-3/4 SM (GAUZE/BANDAGES/DRESSINGS) IMPLANT
ELECT COATED BLADE 2.86 ST (ELECTRODE) ×1 IMPLANT
ELECTRODE BLDE 4.0 EZ CLN MEGD (MISCELLANEOUS) IMPLANT
ELECTRODE REM PT RTRN 9FT ADLT (ELECTROSURGICAL) ×1 IMPLANT
EVACUATOR SILICONE 100CC (DRAIN) IMPLANT
GAUZE PAD ABD 8X10 STRL (GAUZE/BANDAGES/DRESSINGS) ×2 IMPLANT
GAUZE SPONGE 4X4 12PLY STRL (GAUZE/BANDAGES/DRESSINGS) IMPLANT
GAUZE SPONGE 4X4 12PLY STRL LF (GAUZE/BANDAGES/DRESSINGS) IMPLANT
GLOVE BIO SURGEON STRL SZ 6 (GLOVE) ×1 IMPLANT
GOWN STRL REUS W/ TWL LRG LVL3 (GOWN DISPOSABLE) ×2 IMPLANT
MARKER SKIN DUAL TIP RULER LAB (MISCELLANEOUS) IMPLANT
NDL HYPO 25X1 1.5 SAFETY (NEEDLE) IMPLANT
NEEDLE HYPO 25X1 1.5 SAFETY (NEEDLE) ×1 IMPLANT
NS IRRIG 1000ML POUR BTL (IV SOLUTION) ×1 IMPLANT
PACK BASIN DAY SURGERY FS (CUSTOM PROCEDURE TRAY) ×1 IMPLANT
PENCIL SMOKE EVACUATOR (MISCELLANEOUS) ×1 IMPLANT
PIN SAFETY STERILE (MISCELLANEOUS) IMPLANT
SHEET MEDIUM DRAPE 40X70 STRL (DRAPES) IMPLANT
SLEEVE SCD COMPRESS KNEE MED (STOCKING) ×1 IMPLANT
SPIKE FLUID TRANSFER (MISCELLANEOUS) IMPLANT
SPONGE T-LAP 18X18 ~~LOC~~+RFID (SPONGE) ×1 IMPLANT
STAPLER SKIN PROX WIDE 3.9 (STAPLE) ×1 IMPLANT
STRIP CLOSURE SKIN 1/2X4 (GAUZE/BANDAGES/DRESSINGS) IMPLANT
SUT ETHILON 2 0 FS 18 (SUTURE) IMPLANT
SUT MNCRL AB 4-0 PS2 18 (SUTURE) ×1 IMPLANT
SUT PDS AB 2-0 CT2 27 (SUTURE) IMPLANT
SUT VIC AB 3-0 PS1 18XBRD (SUTURE) ×1 IMPLANT
SUT VIC AB 3-0 SH 27X BRD (SUTURE) IMPLANT
SUT VIC AB 4-0 PS2 18 (SUTURE) ×1 IMPLANT
SUTURE STRATFX MNCRL+ 3-0 PS-2 (SUTURE) IMPLANT
SYR BULB IRRIG 60ML STRL (SYRINGE) ×1 IMPLANT
SYR CONTROL 10ML LL (SYRINGE) IMPLANT
TAPE MEASURE VINYL STERILE (MISCELLANEOUS) ×1 IMPLANT
TOWEL GREEN STERILE FF (TOWEL DISPOSABLE) ×2 IMPLANT
TUBE CONNECTING 20X1/4 (TUBING) ×1 IMPLANT
UNDERPAD 30X36 HEAVY ABSORB (UNDERPADS AND DIAPERS) ×2 IMPLANT
YANKAUER SUCT BULB TIP NO VENT (SUCTIONS) ×1 IMPLANT

## 2023-12-03 NOTE — Anesthesia Procedure Notes (Signed)
**Note De-Identified Malmquist Obfuscation** Procedure Name: Intubation Date/Time: 12/03/2023 12:47 PM  Performed by: Steffani Edman, CRNAPre-anesthesia Checklist: Patient identified, Emergency Drugs available, Suction available and Patient being monitored Patient Re-evaluated:Patient Re-evaluated prior to induction Oxygen Delivery Method: Circle System Utilized Preoxygenation: Pre-oxygenation with 100% oxygen Induction Type: IV induction Ventilation: Mask ventilation without difficulty Laryngoscope Size: Glidescope and 3 Grade View: Grade I Tube type: Oral Tube size: 7.0 mm Number of attempts: 1 Airway Equipment and Method: Stylet and Oral airway Placement Confirmation: ETT inserted through vocal cords under direct vision, positive ETCO2 and breath sounds checked- equal and bilateral Secured at: 22 cm Tube secured with: Tape Dental Injury: Teeth and Oropharynx as per pre-operative assessment  Difficulty Due To: Difficulty was anticipated, Difficult Airway- due to cervical collar and Difficult Airway-  due to neck instability Future Recommendations: Recommend- induction with short-acting agent, and alternative techniques readily available Comments: Neck brace in place during intubation.

## 2023-12-03 NOTE — Interval H&P Note (Signed)
**Note De-Identified Winski Obfuscation** History and Physical Interval Note:  12/03/2023 12:09 PM  Felicia Schultz  has presented today for surgery, with the diagnosis of C50.411.  The various methods of treatment have been discussed with the patient and family. After consideration of risks, benefits and other options for treatment, the patient has consented to  Procedure(s): MASTOPEXY (Bilateral) as a surgical intervention.  The patient's history has been reviewed, patient examined, no change in status, stable for surgery.  I have reviewed the patient's chart and labs.  Questions were answered to the patient's satisfaction.     Lisabeth Rider [name]

## 2023-12-03 NOTE — Transfer of Care (Signed)
**Note De-Identified Misenheimer Obfuscation** Immediate Anesthesia Transfer of Care Note  Patient: Felicia Schultz  Procedure(s) Performed: MASTOPEXY (Bilateral: Breast)  Patient Location: PACU  Anesthesia Type:General  Level of Consciousness: drowsy and patient cooperative  Airway & Oxygen Therapy: Patient Spontanous Breathing and Patient connected to face mask oxygen  Post-op Assessment: Report given to RN and Post -op Vital signs reviewed and stable  Post vital signs: Reviewed and stable  Last Vitals:  Vitals Value Taken Time  BP 136/79 12/03/23 16:07  Temp    Pulse 69 12/03/23 16:10  Resp 13 12/03/23 16:10  SpO2 100 % 12/03/23 16:10  Vitals shown include unfiled device data.  Last Pain:  Vitals:   12/03/23 1109  TempSrc: Temporal  PainSc: 0-No pain      Patients Stated Pain Goal: 3 (12/03/23 1109)  Complications: No notable events documented.

## 2023-12-03 NOTE — Discharge Instructions (Signed)
**Note De-Identified Card Obfuscation** No Tylenol  until 5:15pm today, if needed.

## 2023-12-03 NOTE — Op Note (Signed)
**Note De-Identified Harju Obfuscation** Operative Note   DATE OF OPERATION: 6.19.2025  LOCATION: [facility]-outpatient  SURGICAL DIVISION: Plastic Surgery  PREOPERATIVE DIAGNOSES:  1. Right breast cancer UOQ ER+  POSTOPERATIVE DIAGNOSES:  same  PROCEDURE:  Rigth oncoplastic reconstruction left breast mastopexy  SURGEON: Alger Infield MD MBA  ASSISTANT: none  ANESTHESIA:  General.   EBL: 45 ml  COMPLICATIONS: None immediate.   INDICATIONS FOR PROCEDURE:  The patient, Felicia Schultz, is a 73 y.o. female born on Apr 21, 1951, is here for staged breast reconstruction following right lumpectomy.    FINDINGS: Right breast tissue 45 g Left breast tissue 156 g  DESCRIPTION OF PROCEDURE:  The patient was marked standing in the preoperative area to mark sternal notch, chest midline, anterior axillary lines, inframammary folds. The location of new nipple areolar complex was marked at level of on inframammary fold on anterior surface breast by palpation. This was marked symmetric over bilateral breasts. With aid of Wise pattern marker, location of new nipple areolar complex and vertical limbs (7 cm) were marked by displacement of breasts along meridian. The patient was taken to the operating room. SCDs were placed and IV antibiotics were given. The patient's operative site was prepped and draped in a sterile fashion. A time out was performed and all information was confirmed to be correct.     I began on right breast. Incision from prior lumpectomy opened and seroma drained. Defect present superior breast and upper outer right breast from just cephalad to NAC approximately 2 cm from skin surface to pectoralis muscle. Inferior pedicle marked and nipple areolar complex incised with 45 mm diameter marker. Pedicle deepithlialized and developed to chest wall. Pedicle developed to chest wall. Medial and lateral flaps developed.Pedicle advanced superiorly into lumpectomy defect. Breast tailor tacked closed.   I then directed  attention to left breast where superior pedicle designed. NAC incised with 45 mm diameter marker. The pedicle was deepithelialized. Pedicle developed to chest wall. Breast tissue resected over lower pole. Medial and lateral flaps developed. Breast tailor tacked closed. Patient brought to upright sitting position and assessed for symmetry. Patient returned to supine position. Breast cavities irrigated and hemostasis obtained. Local anesthetic infiltrated throughout each breast. 15 Fr JP placed in each breast and secured with 2-0 nylon. Closure completed bilateral with interrupted and short running 3-0 vicryl used to approximate dermis along remainder inframammary fold and vertical limb. NAC inset with 3-0 vicryl in dermis. Skin closure completed with 4-0 monocryl subcuticular throughout vertical limbs and NAC. Skin closure completed with 3-0 Strattafix along each IMF. Tissue adhesive applied. Dry dressing and breast binder applied.  The patient was allowed to wake from anesthesia, extubated and taken to the recovery room in satisfactory condition.   SPECIMENS: Right breast tissue Left breast tissue  DRAINS: 15 Fr JP in right and left breast  Alger Infield, MD [facility] Plastic & Reconstructive Surgery  Office/ physician access line after hours [phone]

## 2023-12-04 ENCOUNTER — Encounter (HOSPITAL_BASED_OUTPATIENT_CLINIC_OR_DEPARTMENT_OTHER): Payer: Self-pay | Admitting: Plastic Surgery

## 2023-12-04 LAB — SURGICAL PATHOLOGY

## 2023-12-04 NOTE — Anesthesia Postprocedure Evaluation (Signed)
**Note De-Identified Moretto Obfuscation** Anesthesia Post Note  Patient: Felicia Schultz  Procedure(s) Performed: MASTOPEXY (Bilateral: Breast)     Patient location during evaluation: PACU Anesthesia Type: General Level of consciousness: awake and alert Pain management: pain level controlled Vital Signs Assessment: post-procedure vital signs reviewed and stable Respiratory status: spontaneous breathing, nonlabored ventilation, respiratory function stable and patient connected to nasal cannula oxygen Cardiovascular status: blood pressure returned to baseline and stable Postop Assessment: no apparent nausea or vomiting Anesthetic complications: no  No notable events documented.  Last Vitals:  Vitals:   12/03/23 1645 12/03/23 1700  BP: 138/79 139/80  Pulse: 67   Resp: 15   Temp:  (!) 36.3 C  SpO2: 96% 94%    Last Pain:  Vitals:   12/03/23 1700  TempSrc:   PainSc: 5    Pain Goal: Patients Stated Pain Goal: 3 (12/03/23 1700)                 Rosalita Combe

## 2023-12-08 ENCOUNTER — Inpatient Hospital Stay: Admitting: Hematology and Oncology

## 2023-12-08 NOTE — Assessment & Plan Note (Deleted)
**Note De-Identified Dasch Obfuscation** 10/21/2023:Palpable right breast lump: 2 masses: Anterior mass 2.4 cm (intraductal papilloma previously resected 2017), posterior mass 1.3 cm grade 2 IDC with apocrine features ER 100%, PR 95%, HER2 negative, Ki-67 5%, 1 enlarged lymph node: Biopsy positive  11/06/2023: CT CAP and bone scan: Negative   Recommendations: 1.  11/24/2023 right lumpectomy: Grade 2 IDC 0.5 cm with IDC papilloma 6 mm, margins negative, LVI not identified, 1/3 lymph node positive, ER 100%, PR 95%, Ki-67 5%, HER2 negative by FISH, mastopexy: Benign 2. Oncotype DX testing to determine if chemotherapy would be of any benefit followed by 3. Adjuvant radiation therapy followed by 4. Adjuvant antiestrogen therapy -------------------------------------------------------------------------------------------------------------------------------------------- Pathology counseling: I discussed the final pathology report of the patient provided  a copy of this report. I discussed the margins as well as lymph node surgeries. We also discussed the final staging along with previously performed ER/PR and HER-2/neu testing.  Return to clinic based upon Oncotype DX test result

## 2023-12-20 NOTE — Assessment & Plan Note (Signed)
**Note De-Identified Gearhart Obfuscation** 10/21/2023:Palpable right breast lump: 2 masses: Anterior mass 2.4 cm (intraductal papilloma previously resected 2017), posterior mass 1.3 cm grade 2 IDC with apocrine features ER 100%, PR 95%, HER2 negative, Ki-67 5%, 1 enlarged lymph node: Biopsy positive  11/06/23: CT and bone scan: No distant mets Recommendations: 1. Breast conserving surgery : Grade 2 IDC 0.5 cm, 1/3 Ln Positive 2. Oncotype DX testing to determine if chemotherapy would be of any benefit followed by 3. Adjuvant radiation therapy followed by 4. Adjuvant antiestrogen therapy

## 2023-12-21 ENCOUNTER — Other Ambulatory Visit: Payer: Self-pay | Admitting: General Surgery

## 2023-12-21 ENCOUNTER — Inpatient Hospital Stay: Attending: Licensed Clinical Social Worker | Admitting: Hematology and Oncology

## 2023-12-21 VITALS — BP 120/64 | HR 87 | Temp 98.8°F | Resp 16 | Ht 64.0 in | Wt 182.8 lb

## 2023-12-21 DIAGNOSIS — Z1732 Human epidermal growth factor receptor 2 negative status: Secondary | ICD-10-CM | POA: Diagnosis not present

## 2023-12-21 DIAGNOSIS — Z79899 Other long term (current) drug therapy: Secondary | ICD-10-CM | POA: Diagnosis not present

## 2023-12-21 DIAGNOSIS — C50411 Malignant neoplasm of upper-outer quadrant of right female breast: Secondary | ICD-10-CM | POA: Diagnosis present

## 2023-12-21 DIAGNOSIS — Z170 Estrogen receptor positive status [ER+]: Secondary | ICD-10-CM | POA: Insufficient documentation

## 2023-12-21 DIAGNOSIS — Z880 Allergy status to penicillin: Secondary | ICD-10-CM | POA: Insufficient documentation

## 2023-12-21 DIAGNOSIS — H3530 Unspecified macular degeneration: Secondary | ICD-10-CM | POA: Insufficient documentation

## 2023-12-21 DIAGNOSIS — E041 Nontoxic single thyroid nodule: Secondary | ICD-10-CM | POA: Diagnosis not present

## 2023-12-21 DIAGNOSIS — Z79811 Long term (current) use of aromatase inhibitors: Secondary | ICD-10-CM | POA: Insufficient documentation

## 2023-12-21 DIAGNOSIS — Z882 Allergy status to sulfonamides status: Secondary | ICD-10-CM | POA: Diagnosis not present

## 2023-12-21 DIAGNOSIS — Z1721 Progesterone receptor positive status: Secondary | ICD-10-CM | POA: Insufficient documentation

## 2023-12-21 DIAGNOSIS — Z881 Allergy status to other antibiotic agents status: Secondary | ICD-10-CM | POA: Insufficient documentation

## 2023-12-21 DIAGNOSIS — Z886 Allergy status to analgesic agent status: Secondary | ICD-10-CM | POA: Insufficient documentation

## 2023-12-21 MED ORDER — ANASTROZOLE 1 MG PO TABS: 1.0000 mg | ORAL_TABLET | Freq: Every day | ORAL | 3 refills | Status: AC

## 2023-12-21 NOTE — Progress Notes (Signed)
**Note De-Identified Felicia Obfuscation** Patient Care Team: Kennyth Worth HERO, MD as PCP - General (Family Medicine) Odean Potts, MD as Consulting Physician (Hematology and Oncology) Glean Stephane BROCKS, RN (Inactive) as Oncology Nurse Navigator Tyree Nanetta SAILOR, RN as Oncology Nurse Navigator Aron Shoulders, MD as Consulting Physician (General Surgery) Izell Domino, MD as Attending Physician (Radiation Oncology)  DIAGNOSIS:  Encounter Diagnosis  Name Primary?   Malignant neoplasm of upper-outer quadrant of right breast in female, estrogen receptor positive (HCC) Yes    SUMMARY OF ONCOLOGIC HISTORY: Oncology History  Malignant neoplasm of upper-outer quadrant of right breast in female, estrogen receptor positive (HCC)  10/21/2023 Initial Diagnosis   Palpable right breast lump: 2 masses: Anterior mass 2.4 cm (intraductal papilloma previously resected 2017), posterior mass 1.3 cm grade 2 IDC with apocrine features ER 100%, PR 95%, HER2 negative, Ki-67 5%, 1 enlarged lymph node: Biopsy positive   10/28/2023 Cancer Staging   Staging form: Breast, AJCC 8th Edition - Clinical: Stage IB (cT1c, cN1, cM0, G2, ER+, PR+, HER2-) - Signed by Odean Potts, MD on 10/28/2023 Stage prefix: Initial diagnosis Histologic grading system: 3 grade system   11/24/2023 Surgery   Right lumpectomy: Grade 2 IDC 0.5 cm with IDC papilloma 6 mm, margins negative, LVI not identified, 1/3 lymph node positive, ER 100%, PR 95%, Ki-67 5%, HER2 negative by FISH     CHIEF COMPLIANT: Follow-up after right lumpectomy to discuss pathology report  HISTORY OF PRESENT ILLNESS:   History of Present Illness Felicia Schultz is a 73 year old female with breast cancer who presents for follow-up after lumpectomy and reconstruction surgery.  She experienced an emotional reaction to the reconstruction surgery, feeling surprised by the surgical changes despite preoperative information. The pathology report shows a small grade two tumor with negative margins and no vascular  invasion. One of three lymph nodes contained cancer. The cancer is estrogen and progesterone receptor positive, stage one, with an oncotype score of two. She is on hormone therapy and is concerned about potential vision side effects, though she has not experienced any. Radiation therapy is scheduled to start next week, pending plastic surgeon clearance. She is managing her work schedule to accommodate treatment and is considering FMLA paperwork to protect her schedule.     ALLERGIES:  is allergic to amoxicillin -pot clavulanate, clarithromycin, nitrofurantoin, sulfonamide derivatives, azithromycin , nsaids, sulfa antibiotics, and other.  MEDICATIONS:  Current Outpatient Medications  Medication Sig Dispense Refill   acetaminophen  (TYLENOL ) 500 MG tablet Take 1,000 mg by mouth daily as needed (pain.).     anastrozole  (ARIMIDEX ) 1 MG tablet Take 1 tablet (1 mg total) by mouth daily. 90 tablet 0   citalopram  (CELEXA ) 40 MG tablet Take 1 tablet (40 mg total) by mouth daily. 90 tablet 3   Cyanocobalamin  (VITAMIN B 12 PO) Take 1 tablet by mouth every evening.     cyclobenzaprine  (FLEXERIL ) 10 MG tablet Take 1 tablet (10 mg total) by mouth 3 (three) times daily as needed for muscle spasms. 30 tablet 0   estradiol  (ESTRACE ) 0.1 MG/GM vaginal cream Place 1 Applicatorful vaginally at bedtime. 42.5 g 11   gabapentin  (NEURONTIN ) 300 MG capsule Take 1 capsule (300 mg total) by mouth every 8 (eight) hours as needed. (Patient taking differently: Take 100 mg by mouth every 8 (eight) hours as needed. When working.) 20 capsule 2   omeprazole  (PRILOSEC) 40 MG capsule Take 1 capsule (40 mg total) by mouth daily before breakfast. 30 mins before breakfast (Patient taking differently: Take 40 mg by **Note De-Identified Westfall Obfuscation** mouth daily.) 90 capsule 3   oxyCODONE  (OXY IR/ROXICODONE ) 5 MG immediate release tablet Take 1 tablet (5 mg total) by mouth every 6 (six) hours as needed for severe pain (pain score 7-10). 15 tablet 0   propranolol  ER (INDERAL   LA) 60 MG 24 hr capsule Take 1 capsule (60 mg total) by mouth daily. (Patient taking differently: Take 60 mg by mouth every evening. As needed mainly once a month.) 90 capsule 3   Vitamin D , Ergocalciferol , (DRISDOL ) 1.25 MG (50000 UNIT) CAPS capsule Take 50000IU once weekly. 12 capsule 3   ferrous sulfate  325 (65 FE) MG tablet Take 325 mg by mouth 3 (three) times a week.     gabapentin  (NEURONTIN ) 100 MG capsule Take 100 mg by mouth daily as needed (pain).     gabapentin  (NEURONTIN ) 300 MG capsule Take 300 mg by mouth daily as needed (pain).     Multiple Vitamins-Minerals (MULTIVITAMIN GUMMIES ADULT PO) Take 1 tablet by mouth every evening.     No current facility-administered medications for this visit.    PHYSICAL EXAMINATION: ECOG PERFORMANCE STATUS: 1 - Symptomatic but completely ambulatory  Vitals:   12/21/23 1445  BP: 120/64  Pulse: 87  Resp: 16  Temp: 98.8 F (37.1 C)  SpO2: 97%   Filed Weights   12/21/23 1445  Weight: 182 lb 12.8 oz (82.9 kg)    Physical Exam   (exam performed in the presence of a chaperone)  LABORATORY DATA:  I have reviewed the data as listed    Latest Ref Rng & Units 11/12/2023   10:10 PM 10/28/2023   12:28 PM 08/20/2023   10:07 AM  CMP  Glucose 70 - 99 mg/dL 829  898  885   BUN 8 - 23 mg/dL 23  18  22    Creatinine 0.44 - 1.00 mg/dL 9.24  9.41  9.35   Sodium 135 - 145 mmol/L 137  141  140   Potassium 3.5 - 5.1 mmol/L 3.7  3.6  3.7   Chloride 98 - 111 mmol/L 105  108  107   CO2 22 - 32 mmol/L 25  29  26    Calcium  8.9 - 10.3 mg/dL 8.8  8.9  9.3   Total Protein 6.5 - 8.1 g/dL 5.3  6.5  6.5   Total Bilirubin 0.0 - 1.2 mg/dL 0.3  0.6  0.5   Alkaline Phos 38 - 126 U/L 78  118  112   AST 15 - 41 U/L 16  17  15    ALT 0 - 44 U/L 16  19  14      Lab Results  Component Value Date   WBC 5.8 10/28/2023   HGB 12.6 10/28/2023   HCT 38.6 10/28/2023   MCV 88.7 10/28/2023   PLT 211 10/28/2023   NEUTROABS 3.7 10/28/2023    ASSESSMENT & PLAN:   Malignant neoplasm of upper-outer quadrant of right breast in female, estrogen receptor positive (HCC) 10/21/2023:Palpable right breast lump: 2 masses: Anterior mass 2.4 cm (intraductal papilloma previously resected 2017), posterior mass 1.3 cm grade 2 IDC with apocrine features ER 100%, PR 95%, HER2 negative, Ki-67 5%, 1 enlarged lymph node: Biopsy positive  11/06/23: CT and bone scan: No distant mets Recommendations: 1. Breast conserving surgery : Right lumpectomy: Grade 2 IDC 0.5 cm, 1/3 Ln Positive, margins negative, ER 100%, PR 95%, Ki67 5%, HER2 negative by FISH 2. Oncotype DX testing: 2 (distant recurrence at 9 years: 1%) 3. Adjuvant radiation therapy started May 2025 prior **Note De-Identified Kinnard Obfuscation** to surgery 4. Adjuvant antiestrogen therapy  Anastrozole  toxicities: Tolerating it extremely well. Return to clinic in 4 months for survivorship care plan visit Assessment & Plan Malignant neoplasm of upper-outer quadrant of right breast Post-lumpectomy stage 1 breast cancer with ER/PR positivity. Tumor grade 2, negative margins, no vascular invasion. 1/3 lymph nodes positive. Oncotype score 2, very low recurrence risk. Anastrozole  well tolerated. No evidence of anastrozole  causing macular degeneration. Recurrence risk extremely low. - Continue anastrozole  therapy. - Proceed with radiation therapy. - Send a year's prescription of anastrozole  to [facility] in [place]. - Remove acetaminophen  from medication list. - Complete FMLA paperwork if needed.  Thyroid  nodule Thyroid  nodule identified on MRI. Evaluation needed to rule out malignancy. - Perform thyroid  ultrasound.  Cataract Cataract diagnosed five years ago, currently asymptomatic. - Monitor for changes in vision or symptoms.      No orders of the defined types were placed in this encounter.  The patient has a good understanding of the overall plan. she agrees with it. she will call with any problems that may develop before the next visit  here. Total time spent: 30 mins including face to face time and time spent for planning, charting and co-ordination of care   Felicia K Ari Engelbrecht, MD 12/21/23

## 2023-12-22 ENCOUNTER — Ambulatory Visit
Admission: RE | Admit: 2023-12-22 | Discharge: 2023-12-22 | Disposition: A | Discharge status: Home / Self Care | Source: Ambulatory Visit | Attending: General Surgery | Admitting: General Surgery

## 2023-12-22 DIAGNOSIS — E041 Nontoxic single thyroid nodule: Secondary | ICD-10-CM

## 2023-12-23 ENCOUNTER — Other Ambulatory Visit: Payer: Self-pay | Admitting: General Surgery

## 2023-12-23 ENCOUNTER — Ambulatory Visit: Payer: Self-pay | Admitting: General Surgery

## 2023-12-23 DIAGNOSIS — E041 Nontoxic single thyroid nodule: Secondary | ICD-10-CM

## 2023-12-24 ENCOUNTER — Encounter: Payer: Self-pay | Admitting: General Surgery

## 2023-12-28 ENCOUNTER — Other Ambulatory Visit (HOSPITAL_COMMUNITY)
Admission: RE | Admit: 2023-12-28 | Discharge: 2023-12-28 | Disposition: A | Discharge status: Home / Self Care | Source: Ambulatory Visit | Attending: Student | Admitting: Student

## 2023-12-28 ENCOUNTER — Ambulatory Visit
Admission: RE | Admit: 2023-12-28 | Discharge: 2023-12-28 | Disposition: A | Discharge status: Home / Self Care | Source: Ambulatory Visit | Attending: General Surgery | Admitting: General Surgery

## 2023-12-28 DIAGNOSIS — E041 Nontoxic single thyroid nodule: Secondary | ICD-10-CM | POA: Insufficient documentation

## 2023-12-30 LAB — CYTOLOGY - NON PAP

## 2023-12-30 NOTE — Therapy (Signed)
**Note De-Identified Solar Obfuscation** OUTPATIENT PHYSICAL THERAPY BREAST CANCER POST OP FOLLOW UP   Patient Name: Felicia Schultz MRN: [id number] DOB:1950/12/13, 73 y.o., female Today's Date: 12/31/2023  END OF SESSION:  PT End of Session - 12/31/23 1359     Visit Number 2    Number of Visits 2    Date for PT Re-Evaluation 12/31/23    PT Start Time 1402    PT Stop Time 1500    PT Time Calculation (min) 58 min    Activity Tolerance Patient tolerated treatment well    Behavior During Therapy [facility] for tasks assessed/performed          Past Medical History:  Diagnosis Date   Allergy     Anemia    Atrial flutter (HCC)    typical appearing   Basal cell carcinoma     nose - 1985   Difficult intubation    GERD (gastroesophageal reflux disease)    GI bleed    H/O cardiac radiofrequency ablation 2015   H/O gastric bypass 2005   History of diabetes mellitus 11/13/2015   Hypokalemia    Osteoporosis 2000   Paroxysmal atrial fibrillation (HCC)    Personal history of colonic polyps - sessile serrated 11/23/2013   PONV (postoperative nausea and vomiting)    Past Surgical History:  Procedure Laterality Date   ANTERIOR CERVICAL DECOMP/DISCECTOMY FUSION N/A 11/11/2023   Procedure: ANTERIOR CERVICAL DECOMPRESSION/DISCECTOMY FUSION CERVICAL FIVE-SIX LONEY REMOVAL;  Surgeon: Mavis Purchase, MD;  Location: [facility] OR;  Service: Neurosurgery;  Laterality: N/A;  ACDF,IP,PLATE/SCREWS R43;ENDD REMOVAL OLD PLATE   APPENDECTOMY     ATRIAL FLUTTER ABLATION N/A 09/05/2014   Procedure: ATRIAL FLUTTER ABLATION;  Surgeon: Lynwood Rakers, MD;  Location: [facility] CATH LAB;  Service: Cardiovascular;  Laterality: N/A;   BIOPSY  06/23/2023   Procedure: BIOPSY;  Surgeon: Wilhelmenia Aloha Raddle., MD;  Location: [facility];  Service: Gastroenterology;;   BREAST BIOPSY Right 10/21/2023   US  RT BREAST BX W LOC DEV 1ST LESION IMG BX SPEC US  GUIDE 10/21/2023 [facility]   BREAST BIOPSY Right 10/21/2023   US  RT BREAST BX W LOC DEV EA ADD LESION IMG BX SPEC  US  GUIDE 10/21/2023 [facility]   BREAST BIOPSY Right 11/20/2023   US  RT RADIOACTIVE SEED EA ADD LESION 11/20/2023 [facility]   BREAST BIOPSY Right 11/20/2023   US  RT RADIOACTIVE SEED EA ADD LESION 11/20/2023 [facility]   BREAST BIOPSY  11/20/2023   US  RT RADIOACTIVE SEED LOC 11/20/2023 [facility]   BREAST EXCISIONAL BIOPSY Right 01/2016   BREAST LUMPECTOMY WITH RADIOACTIVE SEED LOCALIZATION Right 11/24/2023   Procedure: RADIOACTIVE SEED GUIDED RIGHT BREAST LUMPECTOMY TIMES TWO WITH MARGINS;  Surgeon: Aron Shoulders, MD;  Location: [facility];  Service: General;  Laterality: Right;   CARPAL TUNNEL RELEASE Bilateral    CHOLECYSTECTOMY     ENTEROSCOPY N/A 06/23/2023   Procedure: ENTEROSCOPY;  Surgeon: Wilhelmenia Aloha Raddle., MD;  Location: [facility];  Service: Gastroenterology;  Laterality: N/A;   FOOT SURGERY Bilateral 2012   shorten bones in both feet   GASTRIC BYPASS     MASTOPEXY Bilateral 12/03/2023   Procedure: MASTOPEXY;  Surgeon: Arelia Filippo, MD;  Location: [facility];  Service: Plastics;  Laterality: Bilateral;   removal of ovary Right 1987   SENTINEL NODE BIOPSY Right 11/24/2023   Procedure: RADIOACTIVE SEED GUIDED RIGHT AXILLARY SENTINEL NODE BIOPSY, RIGHT AXILLARY SENTINEL LYMPH NODE BIOPSIES;  Surgeon: Aron Shoulders, MD;  Location: [facility];  Service: General; **Note De-Identified Matchett Obfuscation** Laterality: Right;   SPINE SURGERY  2000/2012   anterior cervical fusion with plating   SUBMUCOSAL TATTOO INJECTION  06/23/2023   Procedure: SUBMUCOSAL TATTOO INJECTION;  Surgeon: Wilhelmenia Aloha Raddle., MD;  Location: [facility];  Service: Gastroenterology;;   TOTAL ABDOMINAL HYSTERECTOMY  1999   WRIST SURGERY Right 2018   Dr. Ahmad -ganglion excision   Patient Active Problem List   Diagnosis Date Noted   Status post surgery 11/11/2023   Cervical spondylosis with radiculopathy 11/11/2023   Malignant neoplasm of upper-outer quadrant of right  breast in female, estrogen receptor positive (HCC) 10/26/2023   Obesity 08/20/2023   Thyroid  nodule 06/30/2023   Vitamin B12 deficiency 08/18/2022   Tremor 08/14/2021   History of atrial fibrillation 08/03/2020   Bilateral dry eyes 03/28/2020   Vitreous membranes and strands, right 03/28/2020   Nuclear sclerotic cataract of right eye 03/28/2020   Nuclear sclerotic cataract of left eye 03/28/2020   Posterior vitreous detachment of left eye 03/28/2020   Posterior vitreous detachment of right eye 03/28/2020   Hyperglycemia 02/18/2019   S/P gastric bypass 07/09/2018   Achilles tendinosis 04/30/2018   Spondylosis of lumbar joint 05/28/2017   Plantar fasciitis, bilateral 05/28/2017   Chronic pain of right wrist 05/28/2017   Haglund's deformity of right heel 02/02/2017   Postmenopausal symptoms 06/02/2016   Cervical spondylosis 07/19/2014   GERD (gastroesophageal reflux disease) 07/19/2014   Vitamin D  deficiency 07/19/2014   History of colonic polyps 11/23/2013   Allergic rhinitis 11/11/2011    PCP:   REFERRING PROVIDER: Jina Nephew, MD  REFERRING DIAG: Right Breast Cancer  THERAPY DIAG:  Abnormal posture  Malignant neoplasm of upper-outer quadrant of right breast in female, estrogen receptor positive (HCC)  Cervicalgia  Rationale for Evaluation and Treatment: Rehabilitation  ONSET DATE: 10/19/2023  SUBJECTIVE:                                                                                                                                                                                           SUBJECTIVE STATEMENT: My breast is doing pretty well. I see the plastic surgeon today. I feel like the stitches may be little infected. I'm s till not able to reach my back so my husband helps me. I haven't done the exercises because of the pull where my stitches are.  Hard getting dressed, so my husband helps. I have a steam mop that I tried to use 2-3 weeks ago but I couldn't do more  than a few swipes. She has radiation simulation tomorrow if approved by Dr. Arelia  PERTINENT HISTORY:  Patient was diagnosed on 10/19/2023 with right grade 2 **Note De-Identified Bourquin Obfuscation** invasive ductal carcinoma breast cancer. It measures 1.3 cm and is located in the upper outer quadrant. It is ER/PR positive and HER2 negative with a Ki67 of 5%. She has a biopsied proven positive axillary lymph node.She has had several spine surgeries including a C4-5 discectomy and fusion in 2011 and a C6-7 discectomy in 2000. She has atrial fibrillation and is scheduled for an anterior cervical decompression and discectomy with fusion on 11/11/2023. She reports left arm weakness as a result of her cervical problems.  She is now s/p 11/24/2023  right lumpectomy with SLNB , 12/03/2023 Right onconplastic reconstruction and left breast reduction by Dr. Arelia. Drains removed on 12/10/2023  PATIENT GOALS:  Reassess how my recovery is going related to arm function, pain, and swelling.  PAIN:  Are you having pain? Yes:  NPRS scale:  1/10 at rest,  4/10 unless she goes too far its a 10. In shoulder, Neck pain 4/10, worst 9/10 Pain location: shoulders dull ache, more fatigue at rest. With use can get excruciating Pain description: 1/10 at rest,  4/10 unless she goes too far its a 10. Aggravating factors: reaching behind back, dressing, disturbed sleep Relieving factors: tylenol , gabapentin   PRECAUTIONS: Recent Surgery, right UE Lymphedema risk, Multiple cervical surgeries with left arm weakness, cord compression  RED FLAGS: None   ACTIVITY LEVEL / LEISURE: back to some walking   OBJECTIVE:   PATIENT SURVEYS:  QUICK DASH: 50%  OBSERVATIONS: T junctions  not healed well, several stitches noted under right breast that are exposed, Left breast T junction not healed and more redness under left breast with yellow tissue noted. Seeing MD tomorrow  POSTURE:  Forward head ,rounded shoulders  LYMPHEDEMA ASSESSMENT:   UPPER EXTREMITY  AROM/PROM:   A/PROM   All limited by neck pain RIGHT   eval   RIGHT 12/31/2023  Shoulder extension 42 45  Shoulder flexion 136 140  Shoulder abduction 140 142  Shoulder internal rotation 37 45  Shoulder external rotation 66 85                          (Blank rows = not tested)   A/PROM LEFT   eval LEFT 12/31/2023  Shoulder extension 35 39  Shoulder flexion 128 148  Shoulder abduction 145 145  Shoulder internal rotation 60 50  Shoulder external rotation 67 80                          (Blank rows = not tested)   CERVICAL AROM:     Percent limited  Flexion WNL  Extension 50% limited  Right lateral flexion 50% limited  Left lateral flexion 50% limited  Right rotation 75% limited  Left rotation 75% limited      UPPER EXTREMITY STRENGTH: Not tested due to neck pain   LYMPHEDEMA ASSESSMENTS (in cm):    LANDMARK RIGHT   eval RIGHT 12/31/2023  10 cm proximal to olecranon process 31.5 31.5  Olecranon process 25.7 26  10  cm proximal to ulnar styloid process 24.1 23.9  Just proximal to ulnar styloid process 16.2 16.8  Across hand at thumb web space 17.9 18.1  At base of 2nd digit 6.5 6.6  (Blank rows = not tested)   LANDMARK LEFT   eval LEFT 12/31/2023  10 cm proximal to olecranon process 32.6 32.4  Olecranon process 25.9 26.1  10 cm proximal to ulnar styloid process 23.8 24.0  Just proximal **Note De-Identified Fiorentino Obfuscation** to ulnar styloid process 17.6 17.0  Across hand at thumb web space 17.2 18.0  At base of 2nd digit 6.3 6.8  (Blank rows = not tested)    Surgery type/Date:11/24/2023 Right lumpectomy with SLNB , 12/03/2023 Right onconplastic reconstruction and left breast reduction  Number of lymph nodes removed: 1+/3 Current/past treatment (chemo, radiation, hormone therapy): radiation, hormone therapy Other symptoms:  Heaviness/tightness Yes, tightness in breast Pain Yes Pitting edema No Infections No Decreased scar mobility Yes Stemmer sign No  PATIENT EDUCATION:  Education details:  SOZO screens, video,post op exercises supine where possible, scar massage, continue exs for duration of radiation and beyond to prevent tightness Person educated: Patient Education method: Explanation, Demonstration, and Handouts Education comprehension: verbalized understanding and returned demonstration  HOME EXERCISE PROGRAM: Reviewed previously given post op HEP with flexion and stargazer in supine x 5,wall slide in standing, scapular retraction in standing   ASSESSMENT:  CLINICAL IMPRESSION:  Pt is s/p Right lumpectomy with SLNB on 11/24/2023 , On 12/03/2023 She had a Right onconplastic reconstruction and left breast reduction .In addition,she has had several spine surgeries including a C4-5 discectomy and fusion in 2011 and a C6-7 discectomy in 2000.SABRA She has atrial fibrillation and is recently s/p an anterior cervical decompression and discectomy with fusion on 11/11/2023. She reports left arm weakness as a result of her cervical problems. After performing her exercises one time her range of motion now exceeds the range of motion she had prior to surgery. Her reconstruction scars are still healing and she sees Dr. Arelia tomorrow. There are several stitches that are exposed and there is some yellowish tissue in several areas of her incisions. There is no sign of lymphedema. She was educated in scar massage to her healed axillary incision. There are no further needs for PT presently, however, she was advised to contact us  if she has any questions or concerns. Her first 3 month SOZO screen was scheduled and she was advised to watch the ABC video.  Pt will benefit from skilled therapeutic intervention to improve on the following deficits: Decreased knowledge of precautions, impaired UE functional use, pain, decreased ROM, postural dysfunction.   PT treatment/interventions: ADL/Self care home management, 97110-Therapeutic exercises, 97535- Self Care, 02859- Manual therapy, and Scar  mobilization   GOALS: Goals reviewed with patient? Yes  LONG TERM GOALS:  (STG=LTG)  GOALS Name Target Date  Goal status  1 Pt will demonstrate she has regained full shoulder ROM and function post operatively compared to baselines.  Baseline: MET 12/31/2023     PLAN:  PT FREQUENCY/DURATION: No further needs identified  PLAN FOR NEXT SESSION: Pt discharged to independent management. Advised to contact us  with any questions or concerns.   [facility]  [address], [address]  [place] [address]  [phone]  After Breast Cancer Class Video It is recommended you view the ABC class video to be educated on lymphedema risk reduction. This video lasts for about 30 minutes. It can be viewed on our website here: [email or web address]  Scar massage You can begin gentle scar massage to you incision sites. Gently place one hand on the incision and move the skin (without sliding on the skin) in various directions. Do this for a few minutes and then you can gently massage either coconut oil or vitamin E cream into the scars.  Compression garment You should continue wearing your compression bra until you feel like you no longer have swelling.  Home exercise Program Continue doing the **Note De-Identified Smelcer Obfuscation** exercises you were given until you feel like you can do them without feeling any tightness at the end.   Walking Program Studies show that 30 minutes of walking per day (fast enough to elevate your heart rate) can significantly reduce the risk of a cancer recurrence. If you can't walk due to other medical reasons, we encourage you to find another activity you could do (like a stationary bike or water exercise).  Posture After breast cancer surgery, people frequently sit with rounded shoulders posture because it puts their incisions on slack and feels better. If you sit like this and scar tissue forms in that position,  you can become very tight and have pain sitting or standing with good posture. Try to be aware of your posture and sit and stand up tall to heal properly.  Follow up PT: It is recommended you return every 3 months for the first 3 years following surgery to be assessed on the SOZO machine for an L-Dex score. This helps prevent clinically significant lymphedema in 95% of patients. These follow up screens are 10 minute appointments that you are not billed for.  Grayce JINNY Sheldon, PT 12/31/2023, 3:02 PM

## 2023-12-31 ENCOUNTER — Ambulatory Visit: Attending: General Surgery

## 2023-12-31 ENCOUNTER — Ambulatory Visit: Payer: Self-pay | Admitting: General Surgery

## 2023-12-31 DIAGNOSIS — M542 Cervicalgia: Secondary | ICD-10-CM | POA: Insufficient documentation

## 2023-12-31 DIAGNOSIS — Z170 Estrogen receptor positive status [ER+]: Secondary | ICD-10-CM | POA: Diagnosis present

## 2023-12-31 DIAGNOSIS — R293 Abnormal posture: Secondary | ICD-10-CM | POA: Diagnosis present

## 2023-12-31 DIAGNOSIS — C50411 Malignant neoplasm of upper-outer quadrant of right female breast: Secondary | ICD-10-CM | POA: Insufficient documentation

## 2023-12-31 NOTE — Patient Instructions (Signed)
 [facility]  [address], [address]  [place] [address]  [phone]  After Breast Cancer Class Video It is recommended you view the ABC class video to be educated on lymphedema risk reduction. This video lasts for about 30 minutes. It can be viewed on our website here: [email or web address]  Scar massage You can begin gentle scar massage to you incision sites. Gently place one hand on the incision and move the skin (without sliding on the skin) in various directions. Do this for a few minutes and then you can gently massage either coconut oil or vitamin E cream into the scars.  Compression garment You should continue wearing your compression bra until you feel like you no longer have swelling.  Home exercise Program Continue doing the exercises you were given until you feel like you can do them without feeling any tightness at the end.   Walking Program Studies show that 30 minutes of walking per day (fast enough to elevate your heart rate) can significantly reduce the risk of a cancer recurrence. If you can't walk due to other medical reasons, we encourage you to find another activity you could do (like a stationary bike or water exercise).  Posture After breast cancer surgery, people frequently sit with rounded shoulders posture because it puts their incisions on slack and feels better. If you sit like this and scar tissue forms in that position, you can become very tight and have pain sitting or standing with good posture. Try to be aware of your posture and sit and stand up tall to heal properly.  Follow up PT: It is recommended you return every 3 months for the first 3 years following surgery to be assessed on the SOZO machine for an L-Dex score. This helps prevent clinically significant lymphedema in 95% of patients. These follow up screens are 10 minute appointments that you are not billed  for.

## 2023-12-31 NOTE — Progress Notes (Signed)
**Note De-Identified [facility] [facility]** Radiation Oncology         [phone] ________________________________  Name: Felicia Schultz MRN: [id number]  Date: 01/01/2024  DOB: 18-May-1951  Follow-Up Visit Note  Outpatient  CC: Kennyth Worth HERO, MD  Odean Potts, MD  Diagnosis:   No diagnosis found.   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     Since her breast consultation date of 10/28/23, she underwent the following imaging (dates and results as follows):  -- CT CAP with contrast on 11/05/23 demonstrated a 1.3 cm right axillary lymph node concerning for metastatic disease. No other sites concerning for metastatic disease were demonstrated, however a indeterminate 1.7 cm left thyroid  nodule was present warranting further evaluation.  -- Whole body bone scan on 11/05/23 showed no evidence of osseous metastatic disease.   Of note: she recently had spinal surgery (cervical spine decompression and hardware removal) on 11/11/23.   She opted to proceed with a right breast lumpectomies x 2 with right axillary sentinel lymph node excisions on 11/24/23 under the care of Dr. Aron. Pathology from the procedure revealed:  -- Right Breast - 6 cmfn Lumpectomy : tumor size of 0.5 cm; histology of grade 2 invasive ductal carcinoma arising from and associated with an intraductal papilloma; margin status to invasive disease of 1 mm from the posterior margin; nodal  status of 1/3 right axillary SLN excisions positive for metastatic carcinoma (metastatic focus the size of 14 mm in the greatest extent; negative for extranodal extension);  ER status: 100% positive and PR status 95% positive, both with strong staining intensity; Proliferation marker Ki67 at 5%; Her2 status negative; Grade 2. -- Right Breast - 2 cmfn Lumpectomy : negative for malignancy with benign findings including UDH, adenosis, and calcifications present.   Oncotype testing was ordered on the final surgical specimen and showed a recurrence score of 2 which indicates a very low recurrence risk. It also indicated no significant benefit from adjuvant chemotherapy.   She then opted to proceed with bilateral mastopexy's on 12/03/23 under the care of Dr. Arelia. Tissue samples from both breasts were sent for pathology and both showed no evidence of malignancy.   In the setting of her staging CT CAP findings, she also underwent a thyroid  US  on 12/22/23 which redemonstrated the left thyroid  nodule measuring 2.7 cm. FNA of the nodule was recommended and accordingly performed on 07/14. 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    She has already began antiestrogen therapy with anastrozole  (as recommended per Dr. Odean) which she is tolerating well.   Symptomatically, the patient reports: ***        ALLERGIES:  is allergic to amoxicillin -pot clavulanate, clarithromycin, nitrofurantoin, sulfonamide derivatives, azithromycin , nsaids, sulfa antibiotics, and other.  Meds: Current Outpatient Medications  Medication Sig Dispense Refill   acetaminophen  (TYLENOL ) 500 MG tablet Take 1,000 mg by mouth daily as needed (pain.).     anastrozole  (ARIMIDEX ) 1 MG **Note De-Identified [place] [facility]ation** tablet Take 1 tablet (1 mg total) by mouth daily. 90 tablet 3   citalopram  (CELEXA ) 40 MG tablet Take 1 tablet (40 mg total) by mouth daily. 90 tablet 3   Cyanocobalamin  (VITAMIN B 12 PO) Take 1 tablet by mouth every  evening.     cyclobenzaprine  (FLEXERIL ) 10 MG tablet Take 1 tablet (10 mg total) by mouth 3 (three) times daily as needed for muscle spasms. 30 tablet 0   estradiol  (ESTRACE ) 0.1 MG/GM vaginal cream Place 1 Applicatorful vaginally at bedtime. 42.5 g 11   ferrous sulfate  325 (65 FE) MG tablet Take 325 mg by mouth 3 (three) times a week.     gabapentin  (NEURONTIN ) 100 MG capsule Take 100 mg by mouth daily as needed (pain).     gabapentin  (NEURONTIN ) 300 MG capsule Take 300 mg by mouth daily as needed (pain).     gabapentin  (NEURONTIN ) 300 MG capsule Take 1 capsule (300 mg total) by mouth every 8 (eight) hours as needed. (Patient taking differently: Take 100 mg by mouth every 8 (eight) hours as needed. When working.) 20 capsule 2   Multiple Vitamins-Minerals (MULTIVITAMIN GUMMIES ADULT PO) Take 1 tablet by mouth every evening.     omeprazole  (PRILOSEC) 40 MG capsule Take 1 capsule (40 mg total) by mouth daily before breakfast. 30 mins before breakfast (Patient taking differently: Take 40 mg by mouth daily.) 90 capsule 3   propranolol  ER (INDERAL  LA) 60 MG 24 hr capsule Take 1 capsule (60 mg total) by mouth daily. (Patient taking differently: Take 60 mg by mouth every evening. As needed mainly once a month.) 90 capsule 3   Vitamin D , Ergocalciferol , (DRISDOL ) 1.25 MG (50000 UNIT) CAPS capsule Take 50000IU once weekly. 12 capsule 3   No current facility-administered medications for this encounter.    Physical Findings:  vitals were not taken for this visit. .     General: Alert and oriented, in no acute distress HEENT: Head is normocephalic. Extraocular movements are intact. Oropharynx is clear. Neck: Neck is supple, no palpable cervical or supraclavicular lymphadenopathy. Heart: Regular in rate and rhythm with no murmurs, rubs, or gallops. Chest: Clear to auscultation bilaterally, with no rhonchi, wheezes, or rales. Abdomen: Soft, nontender, nondistended, with no rigidity or  guarding. Extremities: No cyanosis or edema. Lymphatics: see Neck Exam Musculoskeletal: symmetric strength and muscle tone throughout. Neurologic: No obvious focalities. Speech is fluent.  Psychiatric: Judgment and insight are intact. Affect is appropriate. Breast exam reveals ***  Lab Findings: Lab Results  Component Value Date   WBC 5.8 10/28/2023   HGB 12.6 10/28/2023   HCT 38.6 10/28/2023   MCV 88.7 10/28/2023   PLT 211 10/28/2023    @LASTCHEMISTRY @  Radiographic Findings: US  FNA BX THYROID  1ST LESION AFIRMA Result Date: 12/28/2023 INDICATION: Indeterminate left mid thyroid  nodule EXAM: ULTRASOUND GUIDED FINE NEEDLE ASPIRATION OF INDETERMINATE THYROID  NODULE COMPARISON:  Ultrasound thyroid  12/22/2023 MEDICATIONS: 1% lidocaine  3 mL COMPLICATIONS: None immediate. TECHNIQUE: Informed written consent was obtained from the patient after a discussion of the risks, benefits and alternatives to treatment. Questions regarding the procedure were encouraged and answered. A timeout was performed prior to the initiation of the procedure. Pre-procedural ultrasound scanning demonstrated unchanged size and appearance of the indeterminate nodule within the left thyroid . The procedure was planned. The neck was prepped in the usual sterile fashion, and a sterile drape was applied covering the operative field. A timeout was performed prior to the initiation of the procedure. Local anesthesia was provided with 1% **Note De-Identified [place] [facility]** lidocaine . Under direct ultrasound guidance, 5 FNA biopsies were performed of the left mid thyroid  nodule with a 25 gauge needle. Multiple ultrasound images were saved for procedural documentation purposes. The samples were prepared and submitted to pathology. Two of the samples were prepared for Afirma testing. Limited post procedural scanning was negative for hematoma or additional complication. Dressings were placed. The patient tolerated the above procedures procedure well without immediate  postprocedural complication. FINDINGS: Nodule reference number based on prior diagnostic ultrasound: 1 Maximum size: 2.7 cm Location: Left; mid ACR TI-RADS risk category: TR 4 Reason for biopsy: meets ACR TI-RADS criteria Ultrasound imaging confirms appropriate placement of the needles within the thyroid  nodule. IMPRESSION: Technically successful ultrasound guided fine needle aspiration of left mid thyroid  nodule. Procedure performed by Warren Dais, NP Electronically Signed   By: Ester Sides M.D.   On: 12/28/2023 16:13   US  THYROID  Result Date: 12/22/2023 CLINICAL DATA:  LEFT thyroid  nodule seen on MRI EXAM: THYROID  ULTRASOUND TECHNIQUE: Ultrasound examination of the thyroid  gland and adjacent soft tissues was performed. COMPARISON:  None available. FINDINGS: Parenchymal Echotexture: Mildly heterogenous Isthmus: 0.3 cm Right lobe: 3.7 x 1.5 x 1.4 cm Left lobe: 4.0 x 1.7 x 2.2 cm _________________________________________________________ Estimated total number of nodules >/= 1 cm: 1 Number of spongiform nodules >/=  2 cm not described below (TR1): 0 Number of mixed cystic and solid nodules >/= 1.5 cm not described below (TR2): 0 _________________________________________________________ Nodule # 1: Location: LEFT; mid Maximum size: 2.7 cm; Other 2 dimensions: 1.4 x 1.9 cm Composition: solid/almost completely solid (2) Echogenicity: hypoechoic (2) Shape: not taller-than-wide (0) Margins: smooth (0) Echogenic foci: none (0) ACR TI-RADS total points: 4. ACR TI-RADS risk category: TR4 (4-6 points). ACR TI-RADS recommendations: **Given size (>/= 1.5 cm) and appearance, fine needle aspiration of this moderately suspicious nodule should be considered based on TI-RADS criteria. IMPRESSION: Nodule 1 (TI-RADS 4), measuring 2.7 cm, located in the mid LEFT thyroid  lobe, meets criteria for FNA. The above is in keeping with the ACR TI-RADS recommendations - J Am Coll Radiol 2017;14:587-595. Electronically Signed   By:  Aliene Lloyd M.D.   On: 12/22/2023 17:19    Impression/Plan: We discussed adjuvant radiotherapy today.  I recommend *** in order to ***.  I reviewed the logistics, benefits, risks, and potential side effects of this treatment in detail. Risks may include but not necessary be limited to acute and late injury tissue in the radiation fields such as skin irritation (change in color/pigmentation, itching, dryness, pain, peeling). She may experience fatigue. We also discussed possible risk of long term cosmetic changes or scar tissue. There is also a smaller risk for lung toxicity, ***cardiac toxicity, ***brachial plexopathy, ***lymphedema, ***musculoskeletal changes, ***rib fragility or ***induction of a second malignancy, ***late chronic non-healing soft tissue wound.    The patient asked good questions which I answered to her satisfaction. She is enthusiastic about proceeding with treatment. A consent form has been *** signed and placed in her chart.  A total of *** medically necessary complex treatment devices will be fabricated and supervised by me: *** fields with MLCs for custom blocks to protect heart, and lungs;  and, a Vac-lok. MORE COMPLEX DEVICES MAY BE MADE IN DOSIMETRY FOR FIELD IN FIELD BEAMS FOR DOSE HOMOGENEITY.  I have requested : 3D Simulation which is medically necessary to give adequate dose to at risk tissues while sparing lungs and heart.  I have requested a DVH of the following structures: lungs, heart, *** lumpectomy **Note De-Identified [facility]uscation** cavity.    The patient will receive *** Gy in *** fractions to the *** with *** fields.  This will be *** followed by a boost.  On date of service, in total, I spent *** minutes on this encounter. Patient was seen in person.  _____________________________________   Lauraine Golden, MD  This document serves as a record of services personally performed by Lauraine Golden, MD. It was created on her behalf by Dorthy Fuse, a trained medical scribe. The creation of this record  is based on the scribe's personal observations and the provider's statements to them. This document has been checked and approved by the attending provider.

## 2024-01-01 ENCOUNTER — Ambulatory Visit: Admitting: Family Medicine

## 2024-01-01 ENCOUNTER — Ambulatory Visit
Admission: RE | Admit: 2024-01-01 | Discharge: 2024-01-01 | Disposition: A | Discharge status: Home / Self Care | Source: Ambulatory Visit | Attending: Radiation Oncology | Admitting: Radiation Oncology

## 2024-01-01 ENCOUNTER — Encounter: Payer: Self-pay | Admitting: Radiation Oncology

## 2024-01-01 ENCOUNTER — Encounter: Payer: Self-pay | Admitting: Family Medicine

## 2024-01-01 VITALS — BP 110/64 | HR 72 | Temp 97.8°F | Wt 176.8 lb

## 2024-01-01 VITALS — BP 140/71 | HR 75 | Temp 97.5°F | Resp 18 | Ht 64.0 in | Wt 179.0 lb

## 2024-01-01 DIAGNOSIS — C50411 Malignant neoplasm of upper-outer quadrant of right female breast: Secondary | ICD-10-CM | POA: Diagnosis not present

## 2024-01-01 DIAGNOSIS — Z170 Estrogen receptor positive status [ER+]: Secondary | ICD-10-CM | POA: Diagnosis not present

## 2024-01-01 DIAGNOSIS — Z510 Encounter for antineoplastic radiation therapy: Secondary | ICD-10-CM | POA: Insufficient documentation

## 2024-01-01 DIAGNOSIS — Z79811 Long term (current) use of aromatase inhibitors: Secondary | ICD-10-CM | POA: Insufficient documentation

## 2024-01-01 DIAGNOSIS — M47812 Spondylosis without myelopathy or radiculopathy, cervical region: Secondary | ICD-10-CM

## 2024-01-01 DIAGNOSIS — R300 Dysuria: Secondary | ICD-10-CM

## 2024-01-01 DIAGNOSIS — Z79899 Other long term (current) drug therapy: Secondary | ICD-10-CM | POA: Insufficient documentation

## 2024-01-01 DIAGNOSIS — E041 Nontoxic single thyroid nodule: Secondary | ICD-10-CM | POA: Insufficient documentation

## 2024-01-01 HISTORY — DX: Malignant neoplasm of unspecified site of unspecified female breast: C50.919

## 2024-01-01 LAB — COMPREHENSIVE METABOLIC PANEL WITH GFR
ALT: 13 U/L (ref 0–35)
AST: 16 U/L (ref 0–37)
Albumin: 4.1 g/dL (ref 3.5–5.2)
Alkaline Phosphatase: 101 U/L (ref 39–117)
BUN: 22 mg/dL (ref 6–23)
CO2: 29 meq/L (ref 19–32)
Calcium: 9.9 mg/dL (ref 8.4–10.5)
Chloride: 107 meq/L (ref 96–112)
Creatinine, Ser: 1.02 mg/dL (ref 0.40–1.20)
GFR: 55.64 mL/min — ABNORMAL LOW (ref >60.00)
Glucose, Bld: 101 mg/dL — ABNORMAL HIGH (ref 70–99)
Potassium: 4 meq/L (ref 3.5–5.1)
Sodium: 142 meq/L (ref 135–145)
Total Bilirubin: 0.6 mg/dL (ref 0.2–1.2)
Total Protein: 6.6 g/dL (ref 6.0–8.3)

## 2024-01-01 LAB — CBC WITH DIFFERENTIAL/PLATELET
Basophils Absolute: 0 K/uL (ref 0.0–0.1)
Basophils Relative: 0.7 % (ref 0.0–3.0)
Eosinophils Absolute: 0.1 K/uL (ref 0.0–0.7)
Eosinophils Relative: 1.2 % (ref 0.0–5.0)
HCT: 37.7 % (ref 36.0–46.0)
Hemoglobin: 12.5 g/dL (ref 12.0–15.0)
Lymphocytes Relative: 22.4 % (ref 12.0–46.0)
Lymphs Abs: 1.3 K/uL (ref 0.7–4.0)
MCHC: 33.2 g/dL (ref 30.0–36.0)
MCV: 91.5 fl (ref 78.0–100.0)
Monocytes Absolute: 0.4 K/uL (ref 0.1–1.0)
Monocytes Relative: 6.8 % (ref 3.0–12.0)
Neutro Abs: 3.9 K/uL (ref 1.4–7.7)
Neutrophils Relative %: 68.9 % (ref 43.0–77.0)
Platelets: 211 K/uL (ref 150.0–400.0)
RBC: 4.12 Mil/uL (ref 3.87–5.11)
RDW: 13.8 % (ref 11.5–15.5)
WBC: 5.6 K/uL (ref 4.0–10.5)

## 2024-01-01 LAB — POC URINALSYSI DIPSTICK (AUTOMATED)
Bilirubin, UA: POSITIVE
Blood, UA: NEGATIVE
Glucose, UA: NEGATIVE
Ketones, UA: POSITIVE
Nitrite, UA: POSITIVE
Protein, UA: POSITIVE — AB
Spec Grav, UA: >=1.03 — AB (ref 1.010–1.025)
Urobilinogen, UA: 1 U/dL
pH, UA: 5.5 (ref 5.0–8.0)

## 2024-01-01 MED ORDER — LEVOFLOXACIN 500 MG PO TABS: 500.0000 mg | ORAL_TABLET | Freq: Every day | ORAL | 0 refills | Status: AC

## 2024-01-01 NOTE — Patient Instructions (Addendum)
**Note De-Identified [place] [facility]** It was very nice to see you today!  I think you probably have a UTI.  We will start Levaquin .  We will contact you once we get urine culture results back  We will check blood work today as well.  Return if symptoms worsen or fail to improve.   Take care, Dr Kennyth  PLEASE NOTE:  If you had any lab tests, please let us  know if you have not heard back within a few days. You may see your results on mychart before we have a chance to review them but we will give you a call once they are reviewed by us .   If we ordered any referrals today, please let us  know if you have not heard from their office within the next week.   If you had any urgent prescriptions sent in today, please check with the pharmacy within an hour of our visit to make sure the prescription was transmitted appropriately.   Please try these tips to maintain a healthy lifestyle:  Eat at least 3 REAL meals and 1-2 snacks per day.  Aim for no more than 5 hours between eating.  If you eat breakfast, please do so within one hour of getting up.   Each meal should contain half fruits/vegetables, one quarter protein, and one quarter carbs (no bigger than a computer mouse)  Cut down on sweet beverages. This includes juice, soda, and sweet tea.   Drink at least 1 glass of water with each meal and aim for at least 8 glasses per day  Exercise at least 150 minutes every week.

## 2024-01-01 NOTE — Progress Notes (Signed)
**Note De-Identified [place] [facility]** Location of Breast Cancer: Malignant Neoplasm of Upper-Outer quadrant of Right Breast, Estrogen Receptor Positive    Histology per Pathology Report:    Receptor Status: ER(100%Positive), PR (95%Positive), Her2-neu (Negative), Ki-67(5%)  Did patient present with symptoms (if so, please note symptoms) or was this found on screening mammography?:  Patient felt a lump Mammogram   Past/Anticipated interventions by surgeon, if anyBETHA Nephew, MD 12/21/2023 Right Breast Radioactive Seed Lumpectomy   Past/Anticipated interventions by medical oncology, if any: Gudena, MD  Lymphedema issues, if any:   None   Pain issues, if any:  None  SAFETY ISSUES: Prior radiation? None Pacemaker/ICD? None Possible current pregnancy?None Is the patient on methotrexate? None  Current Complaints / other details:  None BP (!) 140/71 (BP Location: Left Arm, Patient Position: Sitting, Cuff Size: Large)   Pulse 75   Temp (!) 97.5 F (36.4 C)   Resp 18   Ht 5' 4 (1.626 m)   Wt 179 lb (81.2 kg)   SpO2 99%   BMI 30.73 kg/m    Wt Readings from Last 3 Encounters:  01/01/24 179 lb (81.2 kg)  01/01/24 176 lb 12.8 oz (80.2 kg)  12/21/23 182 lb 12.8 oz (82.9 kg)

## 2024-01-01 NOTE — Assessment & Plan Note (Signed)
**Note De-Identified [place] [facility]** She is doing very well status post surgery a couple of months ago.

## 2024-01-01 NOTE — Progress Notes (Signed)
**Note De-identified [facility] [facility]**   **Note De-Identified [place] [facility]** Felicia Schultz is a 73 y.o. female who presents today for an office visit.  Assessment/Plan:  New/Acute Problems: Dysuria  UA and history consistent with UTI.  Will empirically start Levaquin  while we await culture results.  She has intolerance to beta-lactam's, sulfa, and Macrobid.  Encourage hydration.  We discussed reasons to return to care.  We will check labs today per patient request.  Chronic Problems Addressed Today: Breast cancer Had lengthy discussion with patient today regarding her recent diagnosis and treatment.  This has been a very stressful period of time for her however she is currently managing well as can be expected.  She has been on anastrozole  and is having some hot flashes with this but otherwise is tolerating well.  She will has upcoming radiation therapy.  Cervical spondylosis She is doing very well status post surgery a couple of months ago.     Subjective:  HPI:  Patient here with dysuria. This has been going on for several months. She thought that she had a kidney stones several months ago but pain has persisted. Pain comes and goes. She has been taking cranberry with some improvement. Also increased urgency and bladder spasms. No reported fevers or chills. Since our last visit she has been following with oncology and surgery due to recent breast cancer diagnosis. This obviously has been very stressful for her however she does feel like things are manageable. She has already had her surgery and is currently undergoing radiation therapy.       Objective:  Physical Exam: BP 110/64   Pulse 72   Temp 97.8 F (36.6 C)   Wt 176 lb 12.8 oz (80.2 kg)   SpO2 97%   BMI 30.35 kg/m   Wt Readings from Last 3 Encounters:  01/01/24 176 lb 12.8 oz (80.2 kg)  12/21/23 182 lb 12.8 oz (82.9 kg)  12/03/23 182 lb 12.2 oz (82.9 kg)    Gen: No acute distress, resting comfortably Neuro: Grossly normal, moves all extremities Psych: Normal affect and thought content       [name] M. Kennyth, MD 01/01/2024 11:33 AM

## 2024-01-04 ENCOUNTER — Ambulatory Visit: Payer: Self-pay | Admitting: Family Medicine

## 2024-01-04 DIAGNOSIS — Z510 Encounter for antineoplastic radiation therapy: Secondary | ICD-10-CM | POA: Diagnosis not present

## 2024-01-04 LAB — URINE CULTURE
MICRO NUMBER:: 16717678
SPECIMEN QUALITY:: ADEQUATE

## 2024-01-04 NOTE — Progress Notes (Signed)
**Note De-Identified [place] [facility]** Her urine culture confirms UTI.  The antibiotic we started her on should treat this.  She should let us  know if her symptoms are not improving.  All of her blood work is stable compared to her previous values.

## 2024-01-06 ENCOUNTER — Ambulatory Visit: Admitting: Radiation Oncology

## 2024-01-06 ENCOUNTER — Encounter: Payer: Self-pay | Admitting: Hematology and Oncology

## 2024-01-07 ENCOUNTER — Ambulatory Visit

## 2024-01-07 ENCOUNTER — Other Ambulatory Visit: Payer: Self-pay

## 2024-01-07 ENCOUNTER — Ambulatory Visit
Admission: RE | Admit: 2024-01-07 | Discharge: 2024-01-07 | Disposition: A | Discharge status: Home / Self Care | Source: Ambulatory Visit | Attending: Radiation Oncology | Admitting: Radiation Oncology

## 2024-01-07 DIAGNOSIS — Z510 Encounter for antineoplastic radiation therapy: Secondary | ICD-10-CM | POA: Diagnosis not present

## 2024-01-07 LAB — RAD ONC ARIA SESSION SUMMARY
Course Elapsed Days: 0
Plan Fractions Treated to Date: 1
Plan Fractions Treated to Date: 1
Plan Prescribed Dose Per Fraction: 2 Gy
Plan Prescribed Dose Per Fraction: 2 Gy
Plan Total Fractions Prescribed: 25
Plan Total Fractions Prescribed: 25
Plan Total Prescribed Dose: 50 Gy
Plan Total Prescribed Dose: 50 Gy
Reference Point Dosage Given to Date: 2 Gy
Reference Point Dosage Given to Date: 2 Gy
Reference Point Session Dosage Given: 2 Gy
Reference Point Session Dosage Given: 2 Gy
Session Number: 1

## 2024-01-08 ENCOUNTER — Encounter: Payer: Self-pay | Admitting: *Deleted

## 2024-01-08 ENCOUNTER — Ambulatory Visit
Admission: RE | Admit: 2024-01-08 | Discharge: 2024-01-08 | Disposition: A | Discharge status: Home / Self Care | Source: Ambulatory Visit | Attending: Radiation Oncology | Admitting: Radiation Oncology

## 2024-01-08 ENCOUNTER — Other Ambulatory Visit: Payer: Self-pay

## 2024-01-08 DIAGNOSIS — C50411 Malignant neoplasm of upper-outer quadrant of right female breast: Secondary | ICD-10-CM

## 2024-01-08 DIAGNOSIS — Z510 Encounter for antineoplastic radiation therapy: Secondary | ICD-10-CM | POA: Diagnosis not present

## 2024-01-08 LAB — RAD ONC ARIA SESSION SUMMARY
Course Elapsed Days: 1
Plan Fractions Treated to Date: 2
Plan Fractions Treated to Date: 2
Plan Prescribed Dose Per Fraction: 2 Gy
Plan Prescribed Dose Per Fraction: 2 Gy
Plan Total Fractions Prescribed: 25
Plan Total Fractions Prescribed: 25
Plan Total Prescribed Dose: 50 Gy
Plan Total Prescribed Dose: 50 Gy
Reference Point Dosage Given to Date: 4 Gy
Reference Point Dosage Given to Date: 4 Gy
Reference Point Session Dosage Given: 2 Gy
Reference Point Session Dosage Given: 2 Gy
Session Number: 2

## 2024-01-11 ENCOUNTER — Ambulatory Visit
Admission: RE | Admit: 2024-01-11 | Discharge: 2024-01-11 | Disposition: A | Discharge status: Home / Self Care | Source: Ambulatory Visit | Attending: Radiation Oncology | Admitting: Radiation Oncology

## 2024-01-11 ENCOUNTER — Other Ambulatory Visit: Payer: Self-pay

## 2024-01-11 DIAGNOSIS — Z510 Encounter for antineoplastic radiation therapy: Secondary | ICD-10-CM | POA: Diagnosis not present

## 2024-01-11 LAB — RAD ONC ARIA SESSION SUMMARY
Course Elapsed Days: 4
Plan Fractions Treated to Date: 3
Plan Fractions Treated to Date: 3
Plan Prescribed Dose Per Fraction: 2 Gy
Plan Prescribed Dose Per Fraction: 2 Gy
Plan Total Fractions Prescribed: 25
Plan Total Fractions Prescribed: 25
Plan Total Prescribed Dose: 50 Gy
Plan Total Prescribed Dose: 50 Gy
Reference Point Dosage Given to Date: 6 Gy
Reference Point Dosage Given to Date: 6 Gy
Reference Point Session Dosage Given: 2 Gy
Reference Point Session Dosage Given: 2 Gy
Session Number: 3

## 2024-01-12 ENCOUNTER — Other Ambulatory Visit: Payer: Self-pay

## 2024-01-12 ENCOUNTER — Encounter (HOSPITAL_COMMUNITY): Payer: Self-pay

## 2024-01-12 ENCOUNTER — Ambulatory Visit
Admission: RE | Admit: 2024-01-12 | Discharge: 2024-01-12 | Disposition: A | Discharge status: Home / Self Care | Source: Ambulatory Visit | Attending: Radiation Oncology | Admitting: Radiation Oncology

## 2024-01-12 DIAGNOSIS — Z510 Encounter for antineoplastic radiation therapy: Secondary | ICD-10-CM | POA: Diagnosis not present

## 2024-01-12 LAB — RAD ONC ARIA SESSION SUMMARY
Course Elapsed Days: 5
Plan Fractions Treated to Date: 4
Plan Fractions Treated to Date: 4
Plan Prescribed Dose Per Fraction: 2 Gy
Plan Prescribed Dose Per Fraction: 2 Gy
Plan Total Fractions Prescribed: 25
Plan Total Fractions Prescribed: 25
Plan Total Prescribed Dose: 50 Gy
Plan Total Prescribed Dose: 50 Gy
Reference Point Dosage Given to Date: 8 Gy
Reference Point Dosage Given to Date: 8 Gy
Reference Point Session Dosage Given: 2 Gy
Reference Point Session Dosage Given: 2 Gy
Session Number: 4

## 2024-01-13 ENCOUNTER — Other Ambulatory Visit: Payer: Self-pay

## 2024-01-13 ENCOUNTER — Ambulatory Visit
Admission: RE | Admit: 2024-01-13 | Discharge: 2024-01-13 | Disposition: A | Discharge status: Home / Self Care | Source: Ambulatory Visit | Attending: Radiation Oncology

## 2024-01-13 DIAGNOSIS — Z510 Encounter for antineoplastic radiation therapy: Secondary | ICD-10-CM | POA: Diagnosis not present

## 2024-01-13 LAB — RAD ONC ARIA SESSION SUMMARY
Course Elapsed Days: 6
Plan Fractions Treated to Date: 5
Plan Fractions Treated to Date: 5
Plan Prescribed Dose Per Fraction: 2 Gy
Plan Prescribed Dose Per Fraction: 2 Gy
Plan Total Fractions Prescribed: 25
Plan Total Fractions Prescribed: 25
Plan Total Prescribed Dose: 50 Gy
Plan Total Prescribed Dose: 50 Gy
Reference Point Dosage Given to Date: 10 Gy
Reference Point Dosage Given to Date: 10 Gy
Reference Point Session Dosage Given: 2 Gy
Reference Point Session Dosage Given: 2 Gy
Session Number: 5

## 2024-01-14 ENCOUNTER — Other Ambulatory Visit: Payer: Self-pay

## 2024-01-14 ENCOUNTER — Ambulatory Visit
Admission: RE | Admit: 2024-01-14 | Discharge: 2024-01-14 | Disposition: A | Discharge status: Home / Self Care | Source: Ambulatory Visit | Attending: Radiation Oncology | Admitting: Radiation Oncology

## 2024-01-14 DIAGNOSIS — Z510 Encounter for antineoplastic radiation therapy: Secondary | ICD-10-CM | POA: Diagnosis not present

## 2024-01-14 LAB — RAD ONC ARIA SESSION SUMMARY
Course Elapsed Days: 7
Plan Fractions Treated to Date: 6
Plan Fractions Treated to Date: 6
Plan Prescribed Dose Per Fraction: 2 Gy
Plan Prescribed Dose Per Fraction: 2 Gy
Plan Total Fractions Prescribed: 25
Plan Total Fractions Prescribed: 25
Plan Total Prescribed Dose: 50 Gy
Plan Total Prescribed Dose: 50 Gy
Reference Point Dosage Given to Date: 12 Gy
Reference Point Dosage Given to Date: 12 Gy
Reference Point Session Dosage Given: 2 Gy
Reference Point Session Dosage Given: 2 Gy
Session Number: 6

## 2024-01-15 ENCOUNTER — Other Ambulatory Visit: Payer: Self-pay

## 2024-01-15 ENCOUNTER — Ambulatory Visit
Admission: RE | Admit: 2024-01-15 | Discharge: 2024-01-15 | Disposition: A | Discharge status: Home / Self Care | Source: Ambulatory Visit | Attending: Radiation Oncology | Admitting: Radiation Oncology

## 2024-01-15 DIAGNOSIS — C50411 Malignant neoplasm of upper-outer quadrant of right female breast: Secondary | ICD-10-CM | POA: Diagnosis present

## 2024-01-15 DIAGNOSIS — Z510 Encounter for antineoplastic radiation therapy: Secondary | ICD-10-CM | POA: Diagnosis present

## 2024-01-15 DIAGNOSIS — Z170 Estrogen receptor positive status [ER+]: Secondary | ICD-10-CM | POA: Diagnosis not present

## 2024-01-15 DIAGNOSIS — Z79811 Long term (current) use of aromatase inhibitors: Secondary | ICD-10-CM | POA: Insufficient documentation

## 2024-01-15 DIAGNOSIS — Z79899 Other long term (current) drug therapy: Secondary | ICD-10-CM | POA: Diagnosis not present

## 2024-01-15 DIAGNOSIS — E041 Nontoxic single thyroid nodule: Secondary | ICD-10-CM | POA: Diagnosis not present

## 2024-01-15 LAB — RAD ONC ARIA SESSION SUMMARY
Course Elapsed Days: 8
Plan Fractions Treated to Date: 7
Plan Fractions Treated to Date: 7
Plan Prescribed Dose Per Fraction: 2 Gy
Plan Prescribed Dose Per Fraction: 2 Gy
Plan Total Fractions Prescribed: 25
Plan Total Fractions Prescribed: 25
Plan Total Prescribed Dose: 50 Gy
Plan Total Prescribed Dose: 50 Gy
Reference Point Dosage Given to Date: 14 Gy
Reference Point Dosage Given to Date: 14 Gy
Reference Point Session Dosage Given: 2 Gy
Reference Point Session Dosage Given: 2 Gy
Session Number: 7

## 2024-01-18 ENCOUNTER — Ambulatory Visit
Admission: RE | Admit: 2024-01-18 | Discharge: 2024-01-18 | Disposition: A | Discharge status: Home / Self Care | Source: Ambulatory Visit | Attending: Radiation Oncology | Admitting: Radiation Oncology

## 2024-01-18 ENCOUNTER — Other Ambulatory Visit: Payer: Self-pay

## 2024-01-18 DIAGNOSIS — Z510 Encounter for antineoplastic radiation therapy: Secondary | ICD-10-CM | POA: Diagnosis not present

## 2024-01-18 LAB — RAD ONC ARIA SESSION SUMMARY
Course Elapsed Days: 11
Plan Fractions Treated to Date: 8
Plan Fractions Treated to Date: 8
Plan Prescribed Dose Per Fraction: 2 Gy
Plan Prescribed Dose Per Fraction: 2 Gy
Plan Total Fractions Prescribed: 25
Plan Total Fractions Prescribed: 25
Plan Total Prescribed Dose: 50 Gy
Plan Total Prescribed Dose: 50 Gy
Reference Point Dosage Given to Date: 16 Gy
Reference Point Dosage Given to Date: 16 Gy
Reference Point Session Dosage Given: 2 Gy
Reference Point Session Dosage Given: 2 Gy
Session Number: 8

## 2024-01-19 ENCOUNTER — Ambulatory Visit
Admission: RE | Admit: 2024-01-19 | Discharge: 2024-01-19 | Disposition: A | Discharge status: Home / Self Care | Source: Ambulatory Visit | Attending: Radiation Oncology | Admitting: Radiation Oncology

## 2024-01-19 ENCOUNTER — Other Ambulatory Visit: Payer: Self-pay

## 2024-01-19 DIAGNOSIS — Z510 Encounter for antineoplastic radiation therapy: Secondary | ICD-10-CM | POA: Diagnosis not present

## 2024-01-19 LAB — RAD ONC ARIA SESSION SUMMARY
Course Elapsed Days: 12
Plan Fractions Treated to Date: 9
Plan Fractions Treated to Date: 9
Plan Prescribed Dose Per Fraction: 2 Gy
Plan Prescribed Dose Per Fraction: 2 Gy
Plan Total Fractions Prescribed: 25
Plan Total Fractions Prescribed: 25
Plan Total Prescribed Dose: 50 Gy
Plan Total Prescribed Dose: 50 Gy
Reference Point Dosage Given to Date: 18 Gy
Reference Point Dosage Given to Date: 18 Gy
Reference Point Session Dosage Given: 2 Gy
Reference Point Session Dosage Given: 2 Gy
Session Number: 9

## 2024-01-20 ENCOUNTER — Ambulatory Visit
Admission: RE | Admit: 2024-01-20 | Discharge: 2024-01-20 | Disposition: A | Discharge status: Home / Self Care | Source: Ambulatory Visit | Attending: Radiation Oncology | Admitting: Radiation Oncology

## 2024-01-20 ENCOUNTER — Other Ambulatory Visit: Payer: Self-pay

## 2024-01-20 DIAGNOSIS — Z510 Encounter for antineoplastic radiation therapy: Secondary | ICD-10-CM | POA: Diagnosis not present

## 2024-01-20 LAB — RAD ONC ARIA SESSION SUMMARY
Course Elapsed Days: 13
Plan Fractions Treated to Date: 10
Plan Fractions Treated to Date: 10
Plan Prescribed Dose Per Fraction: 2 Gy
Plan Prescribed Dose Per Fraction: 2 Gy
Plan Total Fractions Prescribed: 25
Plan Total Fractions Prescribed: 25
Plan Total Prescribed Dose: 50 Gy
Plan Total Prescribed Dose: 50 Gy
Reference Point Dosage Given to Date: 20 Gy
Reference Point Dosage Given to Date: 20 Gy
Reference Point Session Dosage Given: 2 Gy
Reference Point Session Dosage Given: 2 Gy
Session Number: 10

## 2024-01-21 ENCOUNTER — Ambulatory Visit
Admission: RE | Admit: 2024-01-21 | Discharge: 2024-01-21 | Disposition: A | Discharge status: Home / Self Care | Source: Ambulatory Visit | Attending: Radiation Oncology | Admitting: Radiation Oncology

## 2024-01-21 ENCOUNTER — Other Ambulatory Visit: Payer: Self-pay

## 2024-01-21 DIAGNOSIS — Z510 Encounter for antineoplastic radiation therapy: Secondary | ICD-10-CM | POA: Diagnosis not present

## 2024-01-21 LAB — RAD ONC ARIA SESSION SUMMARY
Course Elapsed Days: 14
Plan Fractions Treated to Date: 11
Plan Fractions Treated to Date: 11
Plan Prescribed Dose Per Fraction: 2 Gy
Plan Prescribed Dose Per Fraction: 2 Gy
Plan Total Fractions Prescribed: 25
Plan Total Fractions Prescribed: 25
Plan Total Prescribed Dose: 50 Gy
Plan Total Prescribed Dose: 50 Gy
Reference Point Dosage Given to Date: 22 Gy
Reference Point Dosage Given to Date: 22 Gy
Reference Point Session Dosage Given: 2 Gy
Reference Point Session Dosage Given: 2 Gy
Session Number: 11

## 2024-01-22 ENCOUNTER — Ambulatory Visit
Admission: RE | Admit: 2024-01-22 | Discharge: 2024-01-22 | Disposition: A | Discharge status: Home / Self Care | Source: Ambulatory Visit | Attending: Radiation Oncology | Admitting: Radiation Oncology

## 2024-01-22 ENCOUNTER — Other Ambulatory Visit: Payer: Self-pay

## 2024-01-22 DIAGNOSIS — Z510 Encounter for antineoplastic radiation therapy: Secondary | ICD-10-CM | POA: Diagnosis not present

## 2024-01-22 LAB — RAD ONC ARIA SESSION SUMMARY
Course Elapsed Days: 15
Plan Fractions Treated to Date: 12
Plan Fractions Treated to Date: 12
Plan Prescribed Dose Per Fraction: 2 Gy
Plan Prescribed Dose Per Fraction: 2 Gy
Plan Total Fractions Prescribed: 25
Plan Total Fractions Prescribed: 25
Plan Total Prescribed Dose: 50 Gy
Plan Total Prescribed Dose: 50 Gy
Reference Point Dosage Given to Date: 24 Gy
Reference Point Dosage Given to Date: 24 Gy
Reference Point Session Dosage Given: 2 Gy
Reference Point Session Dosage Given: 2 Gy
Session Number: 12

## 2024-01-25 ENCOUNTER — Ambulatory Visit
Admission: RE | Admit: 2024-01-25 | Discharge: 2024-01-25 | Disposition: A | Discharge status: Home / Self Care | Source: Ambulatory Visit | Attending: Radiation Oncology

## 2024-01-25 ENCOUNTER — Other Ambulatory Visit: Payer: Self-pay

## 2024-01-25 DIAGNOSIS — Z510 Encounter for antineoplastic radiation therapy: Secondary | ICD-10-CM | POA: Diagnosis not present

## 2024-01-25 LAB — RAD ONC ARIA SESSION SUMMARY
Course Elapsed Days: 18
Plan Fractions Treated to Date: 13
Plan Fractions Treated to Date: 13
Plan Prescribed Dose Per Fraction: 2 Gy
Plan Prescribed Dose Per Fraction: 2 Gy
Plan Total Fractions Prescribed: 25
Plan Total Fractions Prescribed: 25
Plan Total Prescribed Dose: 50 Gy
Plan Total Prescribed Dose: 50 Gy
Reference Point Dosage Given to Date: 26 Gy
Reference Point Dosage Given to Date: 26 Gy
Reference Point Session Dosage Given: 2 Gy
Reference Point Session Dosage Given: 2 Gy
Session Number: 13

## 2024-01-26 ENCOUNTER — Other Ambulatory Visit: Payer: Self-pay

## 2024-01-26 ENCOUNTER — Ambulatory Visit
Admission: RE | Admit: 2024-01-26 | Discharge: 2024-01-26 | Discharge status: Home / Self Care | Source: Ambulatory Visit | Attending: Radiation Oncology

## 2024-01-26 DIAGNOSIS — Z510 Encounter for antineoplastic radiation therapy: Secondary | ICD-10-CM | POA: Diagnosis not present

## 2024-01-26 LAB — RAD ONC ARIA SESSION SUMMARY
Course Elapsed Days: 19
Plan Fractions Treated to Date: 14
Plan Fractions Treated to Date: 14
Plan Prescribed Dose Per Fraction: 2 Gy
Plan Prescribed Dose Per Fraction: 2 Gy
Plan Total Fractions Prescribed: 25
Plan Total Fractions Prescribed: 25
Plan Total Prescribed Dose: 50 Gy
Plan Total Prescribed Dose: 50 Gy
Reference Point Dosage Given to Date: 28 Gy
Reference Point Dosage Given to Date: 28 Gy
Reference Point Session Dosage Given: 2 Gy
Reference Point Session Dosage Given: 2 Gy
Session Number: 14

## 2024-01-27 ENCOUNTER — Other Ambulatory Visit: Payer: Self-pay

## 2024-01-27 ENCOUNTER — Ambulatory Visit
Admission: RE | Admit: 2024-01-27 | Discharge: 2024-01-27 | Disposition: A | Discharge status: Home / Self Care | Source: Ambulatory Visit | Attending: Radiation Oncology | Admitting: Radiation Oncology

## 2024-01-27 DIAGNOSIS — Z510 Encounter for antineoplastic radiation therapy: Secondary | ICD-10-CM | POA: Diagnosis not present

## 2024-01-27 LAB — RAD ONC ARIA SESSION SUMMARY
Course Elapsed Days: 20
Plan Fractions Treated to Date: 15
Plan Fractions Treated to Date: 15
Plan Prescribed Dose Per Fraction: 2 Gy
Plan Prescribed Dose Per Fraction: 2 Gy
Plan Total Fractions Prescribed: 25
Plan Total Fractions Prescribed: 25
Plan Total Prescribed Dose: 50 Gy
Plan Total Prescribed Dose: 50 Gy
Reference Point Dosage Given to Date: 30 Gy
Reference Point Dosage Given to Date: 30 Gy
Reference Point Session Dosage Given: 2 Gy
Reference Point Session Dosage Given: 2 Gy
Session Number: 15

## 2024-01-28 ENCOUNTER — Ambulatory Visit
Admission: RE | Admit: 2024-01-28 | Discharge: 2024-01-28 | Disposition: A | Discharge status: Home / Self Care | Source: Ambulatory Visit | Attending: Radiation Oncology | Admitting: Radiation Oncology

## 2024-01-28 ENCOUNTER — Other Ambulatory Visit: Payer: Self-pay

## 2024-01-28 DIAGNOSIS — Z510 Encounter for antineoplastic radiation therapy: Secondary | ICD-10-CM | POA: Diagnosis not present

## 2024-01-28 LAB — RAD ONC ARIA SESSION SUMMARY
Course Elapsed Days: 21
Plan Fractions Treated to Date: 16
Plan Fractions Treated to Date: 16
Plan Prescribed Dose Per Fraction: 2 Gy
Plan Prescribed Dose Per Fraction: 2 Gy
Plan Total Fractions Prescribed: 25
Plan Total Fractions Prescribed: 25
Plan Total Prescribed Dose: 50 Gy
Plan Total Prescribed Dose: 50 Gy
Reference Point Dosage Given to Date: 32 Gy
Reference Point Dosage Given to Date: 32 Gy
Reference Point Session Dosage Given: 2 Gy
Reference Point Session Dosage Given: 2 Gy
Session Number: 16

## 2024-01-29 ENCOUNTER — Ambulatory Visit
Admission: RE | Admit: 2024-01-29 | Discharge: 2024-01-29 | Disposition: A | Discharge status: Home / Self Care | Source: Ambulatory Visit | Attending: Radiation Oncology | Admitting: Radiation Oncology

## 2024-01-29 ENCOUNTER — Other Ambulatory Visit: Payer: Self-pay

## 2024-01-29 DIAGNOSIS — Z510 Encounter for antineoplastic radiation therapy: Secondary | ICD-10-CM | POA: Diagnosis not present

## 2024-01-29 LAB — RAD ONC ARIA SESSION SUMMARY
Course Elapsed Days: 22
Plan Fractions Treated to Date: 17
Plan Fractions Treated to Date: 17
Plan Prescribed Dose Per Fraction: 2 Gy
Plan Prescribed Dose Per Fraction: 2 Gy
Plan Total Fractions Prescribed: 25
Plan Total Fractions Prescribed: 25
Plan Total Prescribed Dose: 50 Gy
Plan Total Prescribed Dose: 50 Gy
Reference Point Dosage Given to Date: 34 Gy
Reference Point Dosage Given to Date: 34 Gy
Reference Point Session Dosage Given: 2 Gy
Reference Point Session Dosage Given: 2 Gy
Session Number: 17

## 2024-02-01 ENCOUNTER — Ambulatory Visit
Admission: RE | Admit: 2024-02-01 | Discharge: 2024-02-01 | Disposition: A | Discharge status: Home / Self Care | Source: Ambulatory Visit | Attending: Radiation Oncology | Admitting: Radiation Oncology

## 2024-02-01 ENCOUNTER — Ambulatory Visit
Admission: RE | Admit: 2024-02-01 | Discharge: 2024-02-01 | Disposition: A | Discharge status: Home / Self Care | Source: Ambulatory Visit | Attending: Radiation Oncology

## 2024-02-01 ENCOUNTER — Other Ambulatory Visit: Payer: Self-pay

## 2024-02-01 DIAGNOSIS — Z510 Encounter for antineoplastic radiation therapy: Secondary | ICD-10-CM | POA: Diagnosis not present

## 2024-02-01 LAB — RAD ONC ARIA SESSION SUMMARY
Course Elapsed Days: 25
Plan Fractions Treated to Date: 18
Plan Fractions Treated to Date: 18
Plan Prescribed Dose Per Fraction: 2 Gy
Plan Prescribed Dose Per Fraction: 2 Gy
Plan Total Fractions Prescribed: 25
Plan Total Fractions Prescribed: 25
Plan Total Prescribed Dose: 50 Gy
Plan Total Prescribed Dose: 50 Gy
Reference Point Dosage Given to Date: 36 Gy
Reference Point Dosage Given to Date: 36 Gy
Reference Point Session Dosage Given: 2 Gy
Reference Point Session Dosage Given: 2 Gy
Session Number: 18

## 2024-02-02 ENCOUNTER — Other Ambulatory Visit: Payer: Self-pay

## 2024-02-02 ENCOUNTER — Ambulatory Visit
Admission: RE | Admit: 2024-02-02 | Discharge: 2024-02-02 | Disposition: A | Discharge status: Home / Self Care | Source: Ambulatory Visit | Attending: Radiation Oncology | Admitting: Radiation Oncology

## 2024-02-02 DIAGNOSIS — Z510 Encounter for antineoplastic radiation therapy: Secondary | ICD-10-CM | POA: Diagnosis not present

## 2024-02-02 LAB — RAD ONC ARIA SESSION SUMMARY
Course Elapsed Days: 26
Plan Fractions Treated to Date: 19
Plan Fractions Treated to Date: 19
Plan Prescribed Dose Per Fraction: 2 Gy
Plan Prescribed Dose Per Fraction: 2 Gy
Plan Total Fractions Prescribed: 25
Plan Total Fractions Prescribed: 25
Plan Total Prescribed Dose: 50 Gy
Plan Total Prescribed Dose: 50 Gy
Reference Point Dosage Given to Date: 38 Gy
Reference Point Dosage Given to Date: 38 Gy
Reference Point Session Dosage Given: 2 Gy
Reference Point Session Dosage Given: 2 Gy
Session Number: 19

## 2024-02-03 ENCOUNTER — Other Ambulatory Visit: Payer: Self-pay

## 2024-02-03 ENCOUNTER — Ambulatory Visit
Admission: RE | Admit: 2024-02-03 | Discharge: 2024-02-03 | Disposition: A | Discharge status: Home / Self Care | Source: Ambulatory Visit | Attending: Radiation Oncology | Admitting: Radiation Oncology

## 2024-02-03 DIAGNOSIS — Z510 Encounter for antineoplastic radiation therapy: Secondary | ICD-10-CM | POA: Diagnosis not present

## 2024-02-03 LAB — RAD ONC ARIA SESSION SUMMARY
Course Elapsed Days: 27
Plan Fractions Treated to Date: 20
Plan Fractions Treated to Date: 20
Plan Prescribed Dose Per Fraction: 2 Gy
Plan Prescribed Dose Per Fraction: 2 Gy
Plan Total Fractions Prescribed: 25
Plan Total Fractions Prescribed: 25
Plan Total Prescribed Dose: 50 Gy
Plan Total Prescribed Dose: 50 Gy
Reference Point Dosage Given to Date: 40 Gy
Reference Point Dosage Given to Date: 40 Gy
Reference Point Session Dosage Given: 2 Gy
Reference Point Session Dosage Given: 2 Gy
Session Number: 20

## 2024-02-04 ENCOUNTER — Other Ambulatory Visit: Payer: Self-pay

## 2024-02-04 ENCOUNTER — Ambulatory Visit
Admission: RE | Admit: 2024-02-04 | Discharge: 2024-02-04 | Disposition: A | Discharge status: Home / Self Care | Source: Ambulatory Visit | Attending: Radiation Oncology

## 2024-02-04 DIAGNOSIS — Z510 Encounter for antineoplastic radiation therapy: Secondary | ICD-10-CM | POA: Diagnosis not present

## 2024-02-04 LAB — RAD ONC ARIA SESSION SUMMARY
Course Elapsed Days: 28
Plan Fractions Treated to Date: 21
Plan Fractions Treated to Date: 21
Plan Prescribed Dose Per Fraction: 2 Gy
Plan Prescribed Dose Per Fraction: 2 Gy
Plan Total Fractions Prescribed: 25
Plan Total Fractions Prescribed: 25
Plan Total Prescribed Dose: 50 Gy
Plan Total Prescribed Dose: 50 Gy
Reference Point Dosage Given to Date: 42 Gy
Reference Point Dosage Given to Date: 42 Gy
Reference Point Session Dosage Given: 2 Gy
Reference Point Session Dosage Given: 2 Gy
Session Number: 21

## 2024-02-05 ENCOUNTER — Other Ambulatory Visit: Payer: Self-pay

## 2024-02-05 ENCOUNTER — Ambulatory Visit
Admission: RE | Admit: 2024-02-05 | Discharge: 2024-02-05 | Disposition: A | Discharge status: Home / Self Care | Source: Ambulatory Visit | Attending: Radiation Oncology | Admitting: Radiation Oncology

## 2024-02-05 DIAGNOSIS — Z510 Encounter for antineoplastic radiation therapy: Secondary | ICD-10-CM | POA: Diagnosis not present

## 2024-02-05 LAB — RAD ONC ARIA SESSION SUMMARY
Course Elapsed Days: 29
Plan Fractions Treated to Date: 22
Plan Fractions Treated to Date: 22
Plan Prescribed Dose Per Fraction: 2 Gy
Plan Prescribed Dose Per Fraction: 2 Gy
Plan Total Fractions Prescribed: 25
Plan Total Fractions Prescribed: 25
Plan Total Prescribed Dose: 50 Gy
Plan Total Prescribed Dose: 50 Gy
Reference Point Dosage Given to Date: 44 Gy
Reference Point Dosage Given to Date: 44 Gy
Reference Point Session Dosage Given: 2 Gy
Reference Point Session Dosage Given: 2 Gy
Session Number: 22

## 2024-02-08 ENCOUNTER — Ambulatory Visit
Admission: RE | Admit: 2024-02-08 | Discharge: 2024-02-08 | Disposition: A | Discharge status: Home / Self Care | Source: Ambulatory Visit | Attending: Radiation Oncology | Admitting: Radiation Oncology

## 2024-02-08 ENCOUNTER — Other Ambulatory Visit: Payer: Self-pay

## 2024-02-08 ENCOUNTER — Ambulatory Visit
Admission: RE | Admit: 2024-02-08 | Discharge: 2024-02-08 | Disposition: A | Discharge status: Home / Self Care | Source: Ambulatory Visit | Attending: Radiation Oncology

## 2024-02-08 DIAGNOSIS — Z510 Encounter for antineoplastic radiation therapy: Secondary | ICD-10-CM | POA: Diagnosis not present

## 2024-02-08 LAB — RAD ONC ARIA SESSION SUMMARY
Course Elapsed Days: 32
Plan Fractions Treated to Date: 23
Plan Fractions Treated to Date: 23
Plan Prescribed Dose Per Fraction: 2 Gy
Plan Prescribed Dose Per Fraction: 2 Gy
Plan Total Fractions Prescribed: 25
Plan Total Fractions Prescribed: 25
Plan Total Prescribed Dose: 50 Gy
Plan Total Prescribed Dose: 50 Gy
Reference Point Dosage Given to Date: 46 Gy
Reference Point Dosage Given to Date: 46 Gy
Reference Point Session Dosage Given: 2 Gy
Reference Point Session Dosage Given: 2 Gy
Session Number: 23

## 2024-02-08 LAB — HM DIABETES EYE EXAM

## 2024-02-09 ENCOUNTER — Encounter: Payer: Self-pay | Admitting: Family Medicine

## 2024-02-09 ENCOUNTER — Other Ambulatory Visit: Payer: Self-pay

## 2024-02-09 ENCOUNTER — Ambulatory Visit
Admission: RE | Admit: 2024-02-09 | Discharge: 2024-02-09 | Disposition: A | Discharge status: Home / Self Care | Source: Ambulatory Visit | Attending: Radiation Oncology

## 2024-02-09 ENCOUNTER — Ambulatory Visit

## 2024-02-09 DIAGNOSIS — Z510 Encounter for antineoplastic radiation therapy: Secondary | ICD-10-CM | POA: Diagnosis not present

## 2024-02-09 LAB — RAD ONC ARIA SESSION SUMMARY
Course Elapsed Days: 33
Plan Fractions Treated to Date: 24
Plan Fractions Treated to Date: 24
Plan Prescribed Dose Per Fraction: 2 Gy
Plan Prescribed Dose Per Fraction: 2 Gy
Plan Total Fractions Prescribed: 25
Plan Total Fractions Prescribed: 25
Plan Total Prescribed Dose: 50 Gy
Plan Total Prescribed Dose: 50 Gy
Reference Point Dosage Given to Date: 48 Gy
Reference Point Dosage Given to Date: 48 Gy
Reference Point Session Dosage Given: 2 Gy
Reference Point Session Dosage Given: 2 Gy
Session Number: 24

## 2024-02-10 ENCOUNTER — Ambulatory Visit
Admission: RE | Admit: 2024-02-10 | Discharge: 2024-02-10 | Disposition: A | Discharge status: Home / Self Care | Source: Ambulatory Visit | Attending: Radiation Oncology | Admitting: Radiation Oncology

## 2024-02-10 ENCOUNTER — Other Ambulatory Visit: Payer: Self-pay

## 2024-02-10 DIAGNOSIS — Z510 Encounter for antineoplastic radiation therapy: Secondary | ICD-10-CM | POA: Diagnosis not present

## 2024-02-10 LAB — RAD ONC ARIA SESSION SUMMARY
Course Elapsed Days: 34
Plan Fractions Treated to Date: 25
Plan Fractions Treated to Date: 25
Plan Prescribed Dose Per Fraction: 2 Gy
Plan Prescribed Dose Per Fraction: 2 Gy
Plan Total Fractions Prescribed: 25
Plan Total Fractions Prescribed: 25
Plan Total Prescribed Dose: 50 Gy
Plan Total Prescribed Dose: 50 Gy
Reference Point Dosage Given to Date: 50 Gy
Reference Point Dosage Given to Date: 50 Gy
Reference Point Session Dosage Given: 2 Gy
Reference Point Session Dosage Given: 2 Gy
Session Number: 25

## 2024-02-12 NOTE — Radiation Completion Notes (Signed)
**Note De-Identified [place] [facility]** Patient Name: Felicia Schultz, Felicia Schultz MRN: [id number] Date of Birth: [birth year] Referring Physician: WORTH KITTY, M.D. Date of Service: 2024-02-12 Radiation Oncologist: Lauraine Golden, M.D. [facility] [place]                             RADIATION ONCOLOGY END OF TREATMENT NOTE     Diagnosis: C50.411 Malignant neoplasm of upper-outer quadrant of right female breast Staging on 2023-10-28: Malignant neoplasm of upper-outer quadrant of right breast in female, estrogen receptor positive (HCC) T=cT1c, N=cN1, M=cM0 Intent: Curative     ==========DELIVERED PLANS==========  First Treatment Date: 2024-01-07 Last Treatment Date: 2024-02-10   Plan Name: Breast_R Site: Breast, Right Technique: 3D Mode: Photon Dose Per Fraction: 2 Gy Prescribed Dose (Delivered / Prescribed): 50 Gy / 50 Gy Prescribed Fxs (Delivered / Prescribed): 25 / 25   Plan Name: Breast_R_ScvP Site: Breast, Right Technique: 3D Mode: Photon Dose Per Fraction: 2 Gy Prescribed Dose (Delivered / Prescribed): 50 Gy / 50 Gy Prescribed Fxs (Delivered / Prescribed): 25 / 25     ==========ON TREATMENT VISIT DATES========== 2024-01-11, 2024-01-15, 2024-01-25, 2024-02-01, 2024-02-08     ==========UPCOMING VISITS========== 04/25/2024 [facility] ONCOLOGY [facility] PLAN VISIT Crawford Morna Pickle, NP  04/19/2024 [facility]-DERMATOLOGY FOLLOW UP 15 Corey Rufus HERO, MD  03/17/2024 [facility]-RADIATION ONC FOLLOW UP 20 Golden Lauraine, MD  02/25/2024 [facility] AT [facility] SCREEN Claudene Glendale BROCKS, PT        ==========APPENDIX - ON TREATMENT VISIT NOTES==========   See weekly On Treatment Notes in Epic for details in the Media tab (listed as Progress notes on the On Treatment Visit Dates listed above).

## 2024-02-25 ENCOUNTER — Ambulatory Visit: Attending: General Surgery

## 2024-02-25 DIAGNOSIS — C50411 Malignant neoplasm of upper-outer quadrant of right female breast: Secondary | ICD-10-CM | POA: Insufficient documentation

## 2024-02-25 DIAGNOSIS — Z170 Estrogen receptor positive status [ER+]: Secondary | ICD-10-CM | POA: Insufficient documentation

## 2024-02-25 NOTE — Therapy (Signed)
**Note De-Identified Maris Obfuscation** OUTPATIENT PHYSICAL THERAPY SOZO SCREENING NOTE   Patient Name: Felicia Schultz MRN: [id number] DOB:January 06, 1951, 73 y.o., female Today's Date: 02/25/2024  PCP: Kennyth Worth HERO, MD REFERRING PROVIDER: Aron Shoulders, MD   PT End of Session - 02/25/24 [phone]     Visit Number 2   unchanged due to screen   PT Start Time 0848          Past Medical History:  Diagnosis Date   Allergy     Anemia    Atrial flutter (HCC)    typical appearing   Basal cell carcinoma     nose - 1985   Breast cancer (HCC)    Difficult intubation    GERD (gastroesophageal reflux disease)    GI bleed    H/O cardiac radiofrequency ablation 2015   H/O gastric bypass 2005   History of diabetes mellitus 11/13/2015   Hypokalemia    Osteoporosis 2000   Paroxysmal atrial fibrillation (HCC)    Personal history of colonic polyps - sessile serrated 11/23/2013   PONV (postoperative nausea and vomiting)    Past Surgical History:  Procedure Laterality Date   ANTERIOR CERVICAL DECOMP/DISCECTOMY FUSION N/A 11/11/2023   Procedure: ANTERIOR CERVICAL DECOMPRESSION/DISCECTOMY FUSION CERVICAL FIVE-SIX LONEY REMOVAL;  Surgeon: Mavis Purchase, MD;  Location: [facility] OR;  Service: Neurosurgery;  Laterality: N/A;  ACDF,IP,PLATE/SCREWS R43;ENDD REMOVAL OLD PLATE   APPENDECTOMY     ATRIAL FLUTTER ABLATION N/A 09/05/2014   Procedure: ATRIAL FLUTTER ABLATION;  Surgeon: Lynwood Rakers, MD;  Location: [facility] CATH LAB;  Service: Cardiovascular;  Laterality: N/A;   BIOPSY  06/23/2023   Procedure: BIOPSY;  Surgeon: Wilhelmenia Aloha Raddle., MD;  Location: [facility];  Service: Gastroenterology;;   BREAST BIOPSY Right 10/21/2023   US  RT BREAST BX W LOC DEV 1ST LESION IMG BX SPEC US  GUIDE 10/21/2023 [facility]   BREAST BIOPSY Right 10/21/2023   US  RT BREAST BX W LOC DEV EA ADD LESION IMG BX SPEC US  GUIDE 10/21/2023 [facility]   BREAST BIOPSY Right 11/20/2023   US  RT RADIOACTIVE SEED EA ADD LESION 11/20/2023 [facility]   BREAST  BIOPSY Right 11/20/2023   US  RT RADIOACTIVE SEED EA ADD LESION 11/20/2023 [facility]   BREAST BIOPSY  11/20/2023   US  RT RADIOACTIVE SEED LOC 11/20/2023 [facility]   BREAST EXCISIONAL BIOPSY Right 01/2016   BREAST LUMPECTOMY WITH RADIOACTIVE SEED LOCALIZATION Right 11/24/2023   Procedure: RADIOACTIVE SEED GUIDED RIGHT BREAST LUMPECTOMY TIMES TWO WITH MARGINS;  Surgeon: Aron Shoulders, MD;  Location: [facility];  Service: General;  Laterality: Right;   CARPAL TUNNEL RELEASE Bilateral    CHOLECYSTECTOMY     ENTEROSCOPY N/A 06/23/2023   Procedure: ENTEROSCOPY;  Surgeon: Wilhelmenia Aloha Raddle., MD;  Location: [facility];  Service: Gastroenterology;  Laterality: N/A;   FOOT SURGERY Bilateral 2012   shorten bones in both feet   GASTRIC BYPASS     MASTOPEXY Bilateral 12/03/2023   Procedure: MASTOPEXY;  Surgeon: Arelia Filippo, MD;  Location: [facility];  Service: Plastics;  Laterality: Bilateral;   removal of ovary Right 1987   SENTINEL NODE BIOPSY Right 11/24/2023   Procedure: RADIOACTIVE SEED GUIDED RIGHT AXILLARY SENTINEL NODE BIOPSY, RIGHT AXILLARY SENTINEL LYMPH NODE BIOPSIES;  Surgeon: Aron Shoulders, MD;  Location: [facility];  Service: General;  Laterality: Right;   SPINE SURGERY  2000/2012   anterior cervical fusion with plating   SUBMUCOSAL TATTOO INJECTION  06/23/2023   Procedure: SUBMUCOSAL TATTOO INJECTION;  Surgeon: Wilhelmenia Aloha Raddle., **Note De-Identified Giddings Obfuscation** MD;  Location: [facility];  Service: Gastroenterology;;   TOTAL ABDOMINAL HYSTERECTOMY  1999   WRIST SURGERY Right 2018   Dr. Ahmad -ganglion excision   Patient Active Problem List   Diagnosis Date Noted   Status post surgery 11/11/2023   Cervical spondylosis with radiculopathy 11/11/2023   Malignant neoplasm of upper-outer quadrant of right breast in female, estrogen receptor positive (HCC) 10/26/2023   Obesity 08/20/2023   Thyroid  nodule 06/30/2023   Vitamin B12 deficiency 08/18/2022    Tremor 08/14/2021   History of atrial fibrillation 08/03/2020   Bilateral dry eyes 03/28/2020   Vitreous membranes and strands, right 03/28/2020   Nuclear sclerotic cataract of right eye 03/28/2020   Nuclear sclerotic cataract of left eye 03/28/2020   Posterior vitreous detachment of left eye 03/28/2020   Posterior vitreous detachment of right eye 03/28/2020   Hyperglycemia 02/18/2019   S/P gastric bypass 07/09/2018   Achilles tendinosis 04/30/2018   Spondylosis of lumbar joint 05/28/2017   Plantar fasciitis, bilateral 05/28/2017   Chronic pain of right wrist 05/28/2017   Haglund's deformity of right heel 02/02/2017   Postmenopausal symptoms 06/02/2016   Cervical spondylosis 07/19/2014   GERD (gastroesophageal reflux disease) 07/19/2014   Vitamin D  deficiency 07/19/2014   History of colonic polyps 11/23/2013   Allergic rhinitis 11/11/2011    REFERRING DIAG: right breast cancer at risk for lymphedema  THERAPY DIAG:  Malignant neoplasm of upper-outer quadrant of right breast in female, estrogen receptor positive (HCC)  PERTINENT HISTORY:  Patient was diagnosed on 10/19/2023 with right grade 2 invasive ductal carcinoma breast cancer. It measures 1.3 cm and is located in the upper outer quadrant. It is ER/PR positive and HER2 negative with a Ki67 of 5%. She has a biopsied proven positive axillary lymph node.She has had several spine surgeries including a C4-5 discectomy and fusion in 2011 and a C6-7 discectomy in 2000. She has atrial fibrillation and is scheduled for an anterior cervical decompression and discectomy with fusion on 11/11/2023. She reports left arm weakness as a result of her cervical problems.  She is now s/p 11/24/2023  right lumpectomy with SLNB , 12/03/2023 Right onconplastic reconstruction and left breast reduction by Dr. Arelia. Drains removed on 12/10/2023    PRECAUTIONS: right UE Lymphedema risk,   SUBJECTIVE:  Pt reports finishing radiation 2 weeks ago. Doing  well but having a little fatigue with work. She has not noticed any swelling or changes in the right UE  PAIN:  Are you having pain? No  SOZO SCREENING:  Patient was assessed today using the SOZO machine to determine the lymphedema index score. This was compared to her baseline score. It was determined that she is NOT within the recommended range when compared to her baseline and so she was fitted for a compression garment while in the clinic today. It is recommended she return in 1 month to be reassessed. If she continues to measure outside the recommended range, physical therapy treatment will be recommended at that time and a referral requested. Took measurements on 2 spots of pts right and left UE ; 10 cm above prox ulna and 10 cm above Olecranon. Left non-dominant arm measures larger than right. No pitting edema and visual appearance is that right UE is smaller. Baseline and Re-eval also show right arm as smaller which may skew figures. Radiation completed 2 weeks ago. Discussed with Eward and she suggested no sleeve presently but return in 1 month for a repeat SOZO. Pt will make a follow **Note De-Identified Glaeser Obfuscation** up appt in 1 month for CATHAY Grayce JINNY Canary, PT 02/25/2024, 8:49 AM

## 2024-03-01 ENCOUNTER — Inpatient Hospital Stay: Attending: Licensed Clinical Social Worker | Admitting: Physician Assistant

## 2024-03-01 VITALS — BP 138/68 | HR 84 | Temp 97.7°F | Resp 20 | Wt 180.6 lb

## 2024-03-01 DIAGNOSIS — Z823 Family history of stroke: Secondary | ICD-10-CM | POA: Diagnosis not present

## 2024-03-01 DIAGNOSIS — N644 Mastodynia: Secondary | ICD-10-CM | POA: Insufficient documentation

## 2024-03-01 DIAGNOSIS — Z822 Family history of deafness and hearing loss: Secondary | ICD-10-CM | POA: Diagnosis not present

## 2024-03-01 DIAGNOSIS — Z170 Estrogen receptor positive status [ER+]: Secondary | ICD-10-CM | POA: Insufficient documentation

## 2024-03-01 DIAGNOSIS — Z801 Family history of malignant neoplasm of trachea, bronchus and lung: Secondary | ICD-10-CM | POA: Insufficient documentation

## 2024-03-01 DIAGNOSIS — Z833 Family history of diabetes mellitus: Secondary | ICD-10-CM | POA: Insufficient documentation

## 2024-03-01 DIAGNOSIS — Z79899 Other long term (current) drug therapy: Secondary | ICD-10-CM | POA: Insufficient documentation

## 2024-03-01 DIAGNOSIS — Z83438 Family history of other disorder of lipoprotein metabolism and other lipidemia: Secondary | ICD-10-CM | POA: Insufficient documentation

## 2024-03-01 DIAGNOSIS — Z9884 Bariatric surgery status: Secondary | ICD-10-CM | POA: Diagnosis not present

## 2024-03-01 DIAGNOSIS — Z1721 Progesterone receptor positive status: Secondary | ICD-10-CM | POA: Diagnosis not present

## 2024-03-01 DIAGNOSIS — Z886 Allergy status to analgesic agent status: Secondary | ICD-10-CM | POA: Insufficient documentation

## 2024-03-01 DIAGNOSIS — Z1732 Human epidermal growth factor receptor 2 negative status: Secondary | ICD-10-CM | POA: Insufficient documentation

## 2024-03-01 DIAGNOSIS — Z882 Allergy status to sulfonamides status: Secondary | ICD-10-CM | POA: Insufficient documentation

## 2024-03-01 DIAGNOSIS — C50411 Malignant neoplasm of upper-outer quadrant of right female breast: Secondary | ICD-10-CM | POA: Insufficient documentation

## 2024-03-01 DIAGNOSIS — Z818 Family history of other mental and behavioral disorders: Secondary | ICD-10-CM | POA: Insufficient documentation

## 2024-03-01 DIAGNOSIS — Z880 Allergy status to penicillin: Secondary | ICD-10-CM | POA: Diagnosis not present

## 2024-03-01 DIAGNOSIS — Z79811 Long term (current) use of aromatase inhibitors: Secondary | ICD-10-CM | POA: Diagnosis not present

## 2024-03-01 DIAGNOSIS — Z90710 Acquired absence of both cervix and uterus: Secondary | ICD-10-CM | POA: Diagnosis not present

## 2024-03-01 DIAGNOSIS — Z8261 Family history of arthritis: Secondary | ICD-10-CM | POA: Insufficient documentation

## 2024-03-01 DIAGNOSIS — Z860101 Personal history of adenomatous and serrated colon polyps: Secondary | ICD-10-CM | POA: Insufficient documentation

## 2024-03-01 DIAGNOSIS — Z841 Family history of disorders of kidney and ureter: Secondary | ICD-10-CM | POA: Diagnosis not present

## 2024-03-01 DIAGNOSIS — Z853 Personal history of malignant neoplasm of breast: Secondary | ICD-10-CM | POA: Insufficient documentation

## 2024-03-01 DIAGNOSIS — Z85828 Personal history of other malignant neoplasm of skin: Secondary | ICD-10-CM | POA: Diagnosis not present

## 2024-03-01 DIAGNOSIS — Z800 Family history of malignant neoplasm of digestive organs: Secondary | ICD-10-CM | POA: Insufficient documentation

## 2024-03-01 DIAGNOSIS — Z86018 Personal history of other benign neoplasm: Secondary | ICD-10-CM | POA: Diagnosis not present

## 2024-03-01 DIAGNOSIS — Z825 Family history of asthma and other chronic lower respiratory diseases: Secondary | ICD-10-CM | POA: Insufficient documentation

## 2024-03-01 DIAGNOSIS — Z8349 Family history of other endocrine, nutritional and metabolic diseases: Secondary | ICD-10-CM | POA: Insufficient documentation

## 2024-03-01 DIAGNOSIS — N6311 Unspecified lump in the right breast, upper outer quadrant: Secondary | ICD-10-CM

## 2024-03-01 DIAGNOSIS — Z811 Family history of alcohol abuse and dependence: Secondary | ICD-10-CM | POA: Insufficient documentation

## 2024-03-01 DIAGNOSIS — Z923 Personal history of irradiation: Secondary | ICD-10-CM | POA: Insufficient documentation

## 2024-03-01 DIAGNOSIS — Z881 Allergy status to other antibiotic agents status: Secondary | ICD-10-CM | POA: Diagnosis not present

## 2024-03-01 DIAGNOSIS — Z9049 Acquired absence of other specified parts of digestive tract: Secondary | ICD-10-CM | POA: Insufficient documentation

## 2024-03-01 DIAGNOSIS — Z8249 Family history of ischemic heart disease and other diseases of the circulatory system: Secondary | ICD-10-CM | POA: Insufficient documentation

## 2024-03-01 NOTE — Progress Notes (Signed)
**Note De-Identified Vandenbrink Obfuscation** Symptom Management Consult Note [facility]    Patient Care Team: Felicia Worth HERO, MD as PCP - General (Family Medicine) Felicia Potts, MD as Consulting Physician (Hematology and Oncology) Felicia Nanetta SAILOR, RN as Oncology Nurse Navigator Felicia Shoulders, MD as Consulting Physician (General Surgery) Felicia Domino, MD as Attending Physician (Radiation Oncology)    Name / MRN / DOBCally Schultz  [id number]  [birth year]   Date of visit: 03/01/2024   Chief Complaint/Reason for visit: breast pain   Current Therapy: anastrozole        ASSESSMENT AND PLAN Patient is a 73 y.o. female with oncologic history of malignant neoplasm of upper-outer quadrant of right breast in female, estrogen receptor positive followed by Dr. Odean.  I have viewed most recent oncology note and lab work.  #Malignant neoplasm of upper-outer quadrant of right breast in female, estrogen receptor positive - s/p right lumpectomy 11/24/23 and reconstruction 12/03/23. Final radiation treatment was 02/10/24 - Next appointment with oncologist is for survivorship on 04/25/24   #Right breast mass New tender lump in right breast post-radiation. Differential includes new mass, papilloma, or seroma. Inconsistent with radiation recall, infection, seroma. - Order right breast mammogram, ultrasound, and biopsy if needed. Will hold off on antibiotics at this time in the absence of systemic symptoms. - Will coordinate with breast center for scheduling and follow-up.   Strict ED precautions discussed should symptoms worsen.   HEME/ONC HISTORY Oncology History  Malignant neoplasm of upper-outer quadrant of right breast in female, estrogen receptor positive (HCC)  10/21/2023 Initial Diagnosis   Palpable right breast lump: 2 masses: Anterior mass 2.4 cm (intraductal papilloma previously resected 2017), posterior mass 1.3 cm grade 2 IDC with apocrine features ER 100%, PR 95%, HER2 negative, Ki-67 5%, 1 enlarged  lymph node: Biopsy positive   10/28/2023 Cancer Staging   Staging form: Breast, AJCC 8th Edition - Clinical: Stage IB (cT1c, cN1, cM0, G2, ER+, PR+, HER2-) - Signed by Felicia Potts, MD on 10/28/2023 Stage prefix: Initial diagnosis Histologic grading system: 3 grade system   11/24/2023 Surgery   Right lumpectomy: Grade 2 IDC 0.5 cm with IDC papilloma 6 mm, margins negative, LVI not identified, 1/3 lymph node positive, ER 100%, PR 95%, Ki-67 5%, HER2 negative by FISH       INTERVAL HISTORY  Discussed the use of AI scribe software for clinical note transcription with the patient, who gave verbal consent to proceed.    Felicia Schultz is a 73 y.o. female with oncologic history as above presenting to [facility] today with chief complaint of right breast mass. Patient presents unaccompanied to visit today.  She completed radiation therapy on February 10, 2024, and has been doing well since then. This morning, she discovered a tender lump in her right breast while showering. The lump is tender to touch but does not cause pain unless touched. There is no associated bruising or history of trauma to the area. No fevers. She recently engaged in physical activity involving lifting boxes, which she managed with some difficulty due to her left-sided weakness from a previous neck surgery. She is right-handed and reports using her right side more during physical activities.  Her past medical history includes breast cancer, for which she underwent a reduction surgery in July 2025, and a history of papilloma in the right breast, one of which was associated with her cancer diagnosis. She also has a history of neck surgery for a nerve impingement on the left side, which **Note De-Identified Felten Obfuscation** is still healing. She denies any muscle aches in her arms, legs, or back, except for the area affected by her neck surgery.        ROS  All other systems are reviewed and are negative for acute change except as noted in the HPI.    Allergies   Allergen Reactions   Amoxicillin -Pot Clavulanate Nausea Only    Severe nausea. Couldn't keep medication down  Severe nausea. Couldn't keep medication down   Clarithromycin Hives   Nitrofurantoin Other (See Comments) and Rash    Unknown Palms and hand itching, turned bright red per pt   Sulfonamide Derivatives Other (See Comments)    Abdominal cramping, nausea   Azithromycin  Other (See Comments)    Z-pack, doesn't do anything Z-pack, doesn't do anything Z-pack, doesn't do anything Z-pack, doesn't do anything    Nsaids Other (See Comments)    Gastric bleeding 06/2023    Sulfa Antibiotics Nausea And Vomiting and Other (See Comments)    Unknown   Other Hives and Rash    Steri strips     Past Medical History:  Diagnosis Date   Allergy     Anemia    Atrial flutter (HCC)    typical appearing   Basal cell carcinoma     nose - 1985   Breast cancer (HCC)    Difficult intubation    GERD (gastroesophageal reflux disease)    GI bleed    H/O cardiac radiofrequency ablation 2015   H/O gastric bypass 2005   History of diabetes mellitus 11/13/2015   Hypokalemia    Osteoporosis 2000   Paroxysmal atrial fibrillation (HCC)    Personal history of colonic polyps - sessile serrated 11/23/2013   PONV (postoperative nausea and vomiting)      Past Surgical History:  Procedure Laterality Date   ANTERIOR CERVICAL DECOMP/DISCECTOMY FUSION N/A 11/11/2023   Procedure: ANTERIOR CERVICAL DECOMPRESSION/DISCECTOMY FUSION CERVICAL FIVE-SIX LONEY REMOVAL;  Surgeon: Felicia Purchase, MD;  Location: [facility] OR;  Service: Neurosurgery;  Laterality: N/A;  ACDF,IP,PLATE/SCREWS R43;ENDD REMOVAL OLD PLATE   APPENDECTOMY     ATRIAL FLUTTER ABLATION N/A 09/05/2014   Procedure: ATRIAL FLUTTER ABLATION;  Surgeon: Felicia Rakers, MD;  Location: [facility] CATH LAB;  Service: Cardiovascular;  Laterality: N/A;   BIOPSY  06/23/2023   Procedure: BIOPSY;  Surgeon: Felicia Aloha Raddle., MD;  [facility];   Service: Gastroenterology;;   BREAST BIOPSY Right 10/21/2023   US  RT BREAST BX W LOC DEV 1ST LESION IMG BX SPEC US  GUIDE 10/21/2023 [facility]   BREAST BIOPSY Right 10/21/2023   US  RT BREAST BX W LOC DEV EA ADD LESION IMG BX SPEC US  GUIDE 10/21/2023 [facility]   BREAST BIOPSY Right 11/20/2023   US  RT RADIOACTIVE SEED EA ADD LESION 11/20/2023 [facility]   BREAST BIOPSY Right 11/20/2023   US  RT RADIOACTIVE SEED EA ADD LESION 11/20/2023 [facility]   BREAST BIOPSY  11/20/2023   US  RT RADIOACTIVE SEED LOC 11/20/2023 [facility]   BREAST EXCISIONAL BIOPSY Right 01/2016   BREAST LUMPECTOMY WITH RADIOACTIVE SEED LOCALIZATION Right 11/24/2023   Procedure: RADIOACTIVE SEED GUIDED RIGHT BREAST LUMPECTOMY TIMES TWO WITH MARGINS;  Surgeon: Felicia Shoulders, MD;  Location: [facility];  Service: General;  Laterality: Right;   CARPAL TUNNEL RELEASE Bilateral    CHOLECYSTECTOMY     ENTEROSCOPY N/A 06/23/2023   Procedure: ENTEROSCOPY;  Surgeon: Felicia Aloha Raddle., MD;  Location: [facility];  Service: Gastroenterology;  Laterality: N/A;   FOOT SURGERY Bilateral 2012 **Note De-Identified Moffa Obfuscation** shorten bones in both feet   GASTRIC BYPASS     MASTOPEXY Bilateral 12/03/2023   Procedure: MASTOPEXY;  Surgeon: Arelia Filippo, MD;  Location: [facility];  Service: Plastics;  Laterality: Bilateral;   removal of ovary Right 1987   SENTINEL NODE BIOPSY Right 11/24/2023   Procedure: RADIOACTIVE SEED GUIDED RIGHT AXILLARY SENTINEL NODE BIOPSY, RIGHT AXILLARY SENTINEL LYMPH NODE BIOPSIES;  Surgeon: Felicia Shoulders, MD;  Location: [facility];  Service: General;  Laterality: Right;   SPINE SURGERY  2000/2012   anterior cervical fusion with plating   SUBMUCOSAL TATTOO INJECTION  06/23/2023   Procedure: SUBMUCOSAL TATTOO INJECTION;  Surgeon: Felicia Aloha Raddle., MD;  Location: [facility];  Service: Gastroenterology;;   TOTAL ABDOMINAL HYSTERECTOMY  1999   WRIST SURGERY Right 2018    Dr. Ahmad -ganglion excision    Social History   Socioeconomic History   Marital status: Married    Spouse name: Not on file   Number of children: 0   Years of education: Not on file   Highest education level: Not on file  Occupational History   Occupation: Designer, fashion/clothing: [employer]  Tobacco Use   Smoking status: Never   Smokeless tobacco: Never  Vaping Use   Vaping status: Never Used  Substance and Sexual Activity   Alcohol use: No   Drug use: No   Sexual activity: Not on file  Other Topics Concern   Not on file  Social History Narrative   Married, no children   Therapist, sports for [employer].   Lives in [place].   1-2 caffeine/day   Social Drivers of Corporate investment banker Strain: Not on file  Food Insecurity: No Food Insecurity (11/11/2023)   Hunger Vital Sign    Worried About Running Out of Food in the Last Year: Never true    Ran Out of Food in the Last Year: Never true  Transportation Needs: No Transportation Needs (11/11/2023)   PRAPARE - Administrator, Civil Service (Medical): No    Lack of Transportation (Non-Medical): No  Physical Activity: Not on file  Stress: Not on file  Social Connections: Moderately Integrated (11/11/2023)   Social Connection and Isolation Panel    Frequency of Communication with Friends and Family: More than three times a week    Frequency of Social Gatherings with Friends and Family: Once a week    Attends Religious Services: 1 to 4 times per year    Active Member of [employer] or Organizations: No    Attends Banker Meetings: Never    Marital Status: Married  Catering manager Violence: Not At Risk (11/11/2023)   Humiliation, Afraid, Rape, and Kick questionnaire    Fear of Current or Ex-Partner: No    Emotionally Abused: No    Physically Abused: No    Sexually Abused: No    Family History  Problem Relation Age of Onset   Diabetes Mother    Heart  disease Mother    Arthritis Mother    Hearing loss Mother    Hyperlipidemia Mother    Hypertension Mother    Kidney disease Mother    Learning disabilities Mother    Miscarriages / Stillbirths Mother    Heart disease Father    Hypertension Father    Alcohol abuse Father    Hyperlipidemia Father    Scleroderma Sister    Early death Sister **Note De-Identified Lamarca Obfuscation** schlederma   Miscarriages / Stillbirths Sister    Emphysema Sister    Diabetes Sister    Hyperlipidemia Sister    Hypertension Sister    Mental illness Sister    Miscarriages / [place] Sister    Stroke Sister    Diabetes Sister    Heart disease Brother    Cancer Brother        lung   Stroke Brother    Lung cancer Brother        smoker   Diabetes Brother    Emphysema Maternal Grandmother    Cancer Maternal Grandmother        throat and lung(smoker)   Esophageal cancer Maternal Grandmother    Colon cancer Maternal Grandfather    Pancreatic cancer Neg Hx    Rectal cancer Neg Hx    Stomach cancer Neg Hx      Current Outpatient Medications:    acetaminophen  (TYLENOL ) 500 MG tablet, Take 1,000 mg by mouth daily as needed (pain.)., Disp: , Rfl:    anastrozole  (ARIMIDEX ) 1 MG tablet, Take 1 tablet (1 mg total) by mouth daily., Disp: 90 tablet, Rfl: 3   citalopram  (CELEXA ) 40 MG tablet, Take 1 tablet (40 mg total) by mouth daily., Disp: 90 tablet, Rfl: 3   Cyanocobalamin  (VITAMIN B 12 PO), Take 1 tablet by mouth every evening., Disp: , Rfl:    cyclobenzaprine  (FLEXERIL ) 10 MG tablet, Take 1 tablet (10 mg total) by mouth 3 (three) times daily as needed for muscle spasms., Disp: 30 tablet, Rfl: 0   estradiol  (ESTRACE ) 0.1 MG/GM vaginal cream, Place 1 Applicatorful vaginally at bedtime., Disp: 42.5 g, Rfl: 11   ferrous sulfate  325 (65 FE) MG tablet, Take 325 mg by mouth 3 (three) times a week., Disp: , Rfl:    gabapentin  (NEURONTIN ) 100 MG capsule, Take 100 mg by mouth daily as needed (pain)., Disp: , Rfl:    gabapentin  (NEURONTIN )  300 MG capsule, Take 300 mg by mouth daily as needed (pain)., Disp: , Rfl:    gabapentin  (NEURONTIN ) 300 MG capsule, Take 1 capsule (300 mg total) by mouth every 8 (eight) hours as needed. (Patient taking differently: Take 100 mg by mouth every 8 (eight) hours as needed. When working.), Disp: 20 capsule, Rfl: 2   Multiple Vitamins-Minerals (MULTIVITAMIN GUMMIES ADULT PO), Take 1 tablet by mouth every evening., Disp: , Rfl:    omeprazole  (PRILOSEC) 40 MG capsule, Take 1 capsule (40 mg total) by mouth daily before breakfast. 30 mins before breakfast, Disp: 90 capsule, Rfl: 3   propranolol  ER (INDERAL  LA) 60 MG 24 hr capsule, Take 1 capsule (60 mg total) by mouth daily., Disp: 90 capsule, Rfl: 3   Vitamin D , Ergocalciferol , (DRISDOL ) 1.25 MG (50000 UNIT) CAPS capsule, Take 50000IU once weekly., Disp: 12 capsule, Rfl: 3  PHYSICAL EXAM ECOG FS:1 - Symptomatic but completely ambulatory    Vitals:   03/01/24 1420  BP: 138/68  Pulse: 84  Resp: 20  Temp: 97.7 F (36.5 C)  SpO2: 100%  Weight: 180 lb 9.6 oz (81.9 kg)   Physical Exam Vitals and nursing note reviewed. Exam conducted with a chaperone present.  Constitutional:      Appearance: She is not ill-appearing or toxic-appearing.  HENT:     Head: Normocephalic.  Eyes:     Conjunctiva/sclera: Conjunctivae normal.  Cardiovascular:     Rate and Rhythm: Normal rate and regular rhythm.     Pulses: Normal pulses.     Heart sounds: Normal **Note De-Identified Danh Obfuscation** heart sounds.  Pulmonary:     Effort: Pulmonary effort is normal.     Breath sounds: Normal breath sounds.  Chest:       Comments: Left breast with no palpable masses. Abdominal:     General: There is no distension.  Musculoskeletal:     Cervical back: Normal range of motion.  Lymphadenopathy:     Upper Body:     Right upper body: No axillary adenopathy.     Left upper body: No axillary adenopathy.  Skin:    General: Skin is warm and dry.  Neurological:     Mental Status: She is alert.         LABORATORY DATA I have reviewed the data as listed    Latest Ref Rng & Units 01/01/2024   11:31 AM 10/28/2023   12:28 PM 08/20/2023   10:07 AM  CBC  WBC 4.0 - 10.5 K/uL 5.6  5.8  5.9   Hemoglobin 12.0 - 15.0 g/dL 87.4  87.3  87.8   Hematocrit 36.0 - 46.0 % 37.7  38.6  37.3   Platelets 150.0 - 400.0 K/uL 211.0  211  247.0         Latest Ref Rng & Units 01/01/2024   11:31 AM 11/12/2023   10:10 PM 10/28/2023   12:28 PM  CMP  Glucose 70 - 99 mg/dL 898  829  898   BUN 6 - 23 mg/dL 22  23  18    Creatinine 0.40 - 1.20 mg/dL 8.97  9.24  9.41   Sodium 135 - 145 mEq/L 142  137  141   Potassium 3.5 - 5.1 mEq/L 4.0  3.7  3.6   Chloride 96 - 112 mEq/L 107  105  108   CO2 19 - 32 mEq/L 29  25  29    Calcium  8.4 - 10.5 mg/dL 9.9  8.8  8.9   Total Protein 6.0 - 8.3 g/dL 6.6  5.3  6.5   Total Bilirubin 0.2 - 1.2 mg/dL 0.6  0.3  0.6   Alkaline Phos 39 - 117 U/L 101  78  118   AST 0 - 37 U/L 16  16  17    ALT 0 - 35 U/L 13  16  19         RADIOGRAPHIC STUDIES (from last 24 hours if applicable) I have personally reviewed the radiological images as listed and agreed with the findings in the report. No results found.      Visit Diagnosis: 1. Malignant neoplasm of upper-outer quadrant of right breast in female, estrogen receptor positive (HCC)   2. Mass of upper outer quadrant of right breast      Orders Placed This Encounter  Procedures   MM DIAG BREAST TOMO UNI RIGHT    Standing Status:   Future    Expected Date:   03/02/2024    Expiration Date:   03/01/2025    Reason for Exam (SYMPTOM  OR DIAGNOSIS REQUIRED):   hx r breast cancer, new tender mass    [facility]erred [facility]   US  BREAST COMPLETE UNI RIGHT INC AXILLA    Standing Status:   Future    Expected Date:   03/02/2024    Expiration Date:   03/01/2025    Reason for Exam (SYMPTOM  OR DIAGNOSIS REQUIRED):   hx right breast cancer, new tender right breast mass    Preferred Imaging Location?:   [facility]   US  RT BREAST BX W **Note De-Identified Sloma Obfuscation** LOC DEV 1ST LESION IMG BX SPEC US  GUIDE    Standing Status:   Future    Expected Date:   03/02/2024    Expiration Date:   03/01/2025    Reason for Exam (SYMPTOM  OR DIAGNOSIS REQUIRED):   hx right breast cancer, new tender right breast mass    Preferred location?:   [facility]    All questions were answered. The patient knows to call the clinic with any problems, questions or concerns. No barriers to learning was detected.  A total of more than 30 minutes were spent on this encounter with face-to-face time and non-face-to-face time, including preparing to see the patient, ordering tests and/or medications, counseling the patient and coordination of care as outlined above.    Thank you for allowing me to participate in the care of this patient.    Jolene Guyett E  Walisiewicz, PA-C Department of Hematology/Oncology [facility] at [facility] Phone: [phone]  [phone]    03/01/2024 3:39 PM

## 2024-03-04 ENCOUNTER — Ambulatory Visit
Admission: RE | Admit: 2024-03-04 | Discharge: 2024-03-04 | Disposition: A | Discharge status: Home / Self Care | Source: Ambulatory Visit | Attending: Physician Assistant | Admitting: Physician Assistant

## 2024-03-04 ENCOUNTER — Telehealth: Payer: Self-pay | Admitting: Physician Assistant

## 2024-03-04 DIAGNOSIS — Z170 Estrogen receptor positive status [ER+]: Secondary | ICD-10-CM

## 2024-03-04 HISTORY — DX: Personal history of irradiation: Z92.3

## 2024-03-04 NOTE — Telephone Encounter (Signed)
**Note De-Identified Jeancharles Obfuscation** I notified Dlisa Barnwell Nardo by phone regarding mammogram results. Mammogram from earlier today shows small fluid collection is seen in the palpable region at 11 o'clock measuring 3.4 x 0.7 x 1.9 cm, most likely a seroma. No malignancy. Patient reports her pain has significantly improved. She plans to monitor the area and knows to follow up with general surgeon's office if pain recurs. Discussed signs of symptoms of infection to watch for. All of patient's questions were answered and she expressed understanding of the plan provided.

## 2024-03-09 NOTE — Progress Notes (Addendum)
**Note De-identified Ahlgren Obfuscation**  **Note De-Identified Quesnel Obfuscation** Catalena Stanhope Oltmann presents today for follow-up after completing radiation to her right breast on 02/10/2024. Patient is doing well  Pain:Patient denies pain  First two weeks,was painful.  Skin: Area on collarbone burnt and turned into a blister. Breast  ROM: Patient denies issues with range of motion. Does physical therapy exercises  Lymphedema: Patient denies MedOnc F/U: 05/05/2024 Other issues of note:  Patient feels more fatigued and sometimes gets body aches and tylenol . She will see her neurologist tomorrow and will discuss her symptoms.  Pt reports Yes No Comments  Tamoxifen []  []    Letrozole []  []    Anastrazole [x]  []  Patient is doing well with the medication.  Mammogram [x]  Date: 03/04/2024 []      Encouraged to call our office with any questions or concerns.

## 2024-03-17 ENCOUNTER — Ambulatory Visit
Admission: RE | Admit: 2024-03-17 | Discharge: 2024-03-17 | Disposition: A | Discharge status: Home / Self Care | Source: Ambulatory Visit | Attending: Radiation Oncology | Admitting: Radiation Oncology

## 2024-03-17 DIAGNOSIS — Z170 Estrogen receptor positive status [ER+]: Secondary | ICD-10-CM

## 2024-03-24 ENCOUNTER — Ambulatory Visit: Attending: General Surgery | Admitting: Physical Therapy

## 2024-03-24 ENCOUNTER — Encounter: Payer: Self-pay | Admitting: Physical Therapy

## 2024-03-24 DIAGNOSIS — Z483 Aftercare following surgery for neoplasm: Secondary | ICD-10-CM | POA: Insufficient documentation

## 2024-03-24 NOTE — Therapy (Signed)
**Note De-Identified Hamblen Obfuscation** OUTPATIENT PHYSICAL THERAPY SOZO SCREENING NOTE   Patient Name: Felicia Schultz MRN: [id number] DOB:19-Nov-1950, 73 y.o., female Today's Date: 03/24/2024  PCP: Kennyth Worth HERO, MD REFERRING PROVIDER: Aron Shoulders, MD   PT End of Session - 03/24/24 1117     Visit Number 2    PT Start Time 1102    PT Stop Time 1114    PT Time Calculation (min) 12 min    Activity Tolerance Patient tolerated treatment well    Behavior During Therapy [facility] for tasks assessed/performed          Past Medical History:  Diagnosis Date   Allergy     Anemia    Atrial flutter (HCC)    typical appearing   Basal cell carcinoma     nose - 1985   Breast cancer (HCC)    Difficult intubation    GERD (gastroesophageal reflux disease)    GI bleed    H/O cardiac radiofrequency ablation 2015   H/O gastric bypass 2005   History of diabetes mellitus 11/13/2015   Hypokalemia    Osteoporosis 2000   Paroxysmal atrial fibrillation (HCC)    Personal history of colonic polyps - sessile serrated 11/23/2013   Personal history of radiation therapy    PONV (postoperative nausea and vomiting)    Past Surgical History:  Procedure Laterality Date   ANTERIOR CERVICAL DECOMP/DISCECTOMY FUSION N/A 11/11/2023   Procedure: ANTERIOR CERVICAL DECOMPRESSION/DISCECTOMY FUSION CERVICAL FIVE-SIX LONEY REMOVAL;  Surgeon: Mavis Purchase, MD;  Location: [facility] OR;  Service: Neurosurgery;  Laterality: N/A;  ACDF,IP,PLATE/SCREWS R43;ENDD REMOVAL OLD PLATE   APPENDECTOMY     ATRIAL FLUTTER ABLATION N/A 09/05/2014   Procedure: ATRIAL FLUTTER ABLATION;  Surgeon: Lynwood Rakers, MD;  Location: [facility] CATH LAB;  Service: Cardiovascular;  Laterality: N/A;   BIOPSY  06/23/2023   Procedure: BIOPSY;  Surgeon: Wilhelmenia Aloha Raddle., MD;  Location: [facility];  Service: Gastroenterology;;   BREAST BIOPSY Right 10/21/2023   US  RT BREAST BX W LOC DEV 1ST LESION IMG BX SPEC US  GUIDE 10/21/2023 [facility]   BREAST BIOPSY Right 10/21/2023   US   RT BREAST BX W LOC DEV EA ADD LESION IMG BX SPEC US  GUIDE 10/21/2023 [facility]   BREAST BIOPSY Right 11/20/2023   US  RT RADIOACTIVE SEED EA ADD LESION 11/20/2023 [facility]   BREAST BIOPSY Right 11/20/2023   US  RT RADIOACTIVE SEED EA ADD LESION 11/20/2023 [facility]   BREAST BIOPSY  11/20/2023   US  RT RADIOACTIVE SEED LOC 11/20/2023 [facility]   BREAST EXCISIONAL BIOPSY Right 01/2016   BREAST LUMPECTOMY     BREAST LUMPECTOMY WITH RADIOACTIVE SEED LOCALIZATION Right 11/24/2023   Procedure: RADIOACTIVE SEED GUIDED RIGHT BREAST LUMPECTOMY TIMES TWO WITH MARGINS;  Surgeon: Aron Shoulders, MD;  Location: [facility];  Service: General;  Laterality: Right;   CARPAL TUNNEL RELEASE Bilateral    CHOLECYSTECTOMY     ENTEROSCOPY N/A 06/23/2023   Procedure: ENTEROSCOPY;  Surgeon: Wilhelmenia Aloha Raddle., MD;  Location: [facility];  Service: Gastroenterology;  Laterality: N/A;   FOOT SURGERY Bilateral 2012   shorten bones in both feet   GASTRIC BYPASS     MASTOPEXY Bilateral 12/03/2023   Procedure: MASTOPEXY;  Surgeon: Arelia Filippo, MD;  Location: [facility];  Service: Plastics;  Laterality: Bilateral;   REDUCTION MAMMAPLASTY     removal of ovary Right 1987   SENTINEL NODE BIOPSY Right 11/24/2023   Procedure: RADIOACTIVE SEED GUIDED RIGHT AXILLARY SENTINEL NODE BIOPSY, RIGHT AXILLARY SENTINEL LYMPH **Note De-Identified Meggett Obfuscation** NODE BIOPSIES;  Surgeon: Aron Shoulders, MD;  Location: [facility];  Service: General;  Laterality: Right;   SPINE SURGERY  2000/2012   anterior cervical fusion with plating   SUBMUCOSAL TATTOO INJECTION  06/23/2023   Procedure: SUBMUCOSAL TATTOO INJECTION;  Surgeon: Wilhelmenia Aloha Raddle., MD;  Location: [facility];  Service: Gastroenterology;;   TOTAL ABDOMINAL HYSTERECTOMY  1999   WRIST SURGERY Right 2018   Dr. Ahmad -ganglion excision   Patient Active Problem List   Diagnosis Date Noted   Status post surgery 11/11/2023    Cervical spondylosis with radiculopathy 11/11/2023   Malignant neoplasm of upper-outer quadrant of right breast in female, estrogen receptor positive (HCC) 10/26/2023   Obesity 08/20/2023   Thyroid  nodule 06/30/2023   Vitamin B12 deficiency 08/18/2022   Tremor 08/14/2021   History of atrial fibrillation 08/03/2020   Bilateral dry eyes 03/28/2020   Vitreous membranes and strands, right 03/28/2020   Nuclear sclerotic cataract of right eye 03/28/2020   Nuclear sclerotic cataract of left eye 03/28/2020   Posterior vitreous detachment of left eye 03/28/2020   Posterior vitreous detachment of right eye 03/28/2020   Hyperglycemia 02/18/2019   S/P gastric bypass 07/09/2018   Achilles tendinosis 04/30/2018   Spondylosis of lumbar joint 05/28/2017   Plantar fasciitis, bilateral 05/28/2017   Chronic pain of right wrist 05/28/2017   Haglund's deformity of right heel 02/02/2017   Postmenopausal symptoms 06/02/2016   Cervical spondylosis 07/19/2014   GERD (gastroesophageal reflux disease) 07/19/2014   Vitamin D  deficiency 07/19/2014   History of colonic polyps 11/23/2013   Allergic rhinitis 11/11/2011    REFERRING DIAG: right breast cancer at risk for lymphedema  THERAPY DIAG:  Aftercare following surgery for neoplasm  PERTINENT HISTORY: Patient was diagnosed on 10/19/2023 with right grade 2 invasive ductal carcinoma breast cancer. It measures 1.3 cm and is located in the upper outer quadrant. It is ER/PR positive and HER2 negative with a Ki67 of 5%. She has a biopsied proven positive axillary lymph node.She has had several spine surgeries including a C4-5 discectomy and fusion in 2011 and a C6-7 discectomy in 2000. She has atrial fibrillation and is scheduled for an anterior cervical decompression and discectomy with fusion on 11/11/2023. She reports left arm weakness as a result of her cervical problems.  She is now s/p 11/24/2023  right lumpectomy with SLNB , 12/03/2023 Right onconplastic  reconstruction and left breast reduction by Dr. Arelia. Drains removed on 12/10/2023  PRECAUTIONS: right UE Lymphedema risk  SUBJECTIVE: Pt here for f/u SOZO screen  PAIN:  Are you having pain? No  SOZO SCREENING:  L-DEX FLOWSHEETS - 03/24/24 1100       L-DEX LYMPHEDEMA SCREENING   Measurement Type Unilateral    L-DEX MEASUREMENT EXTREMITY Upper Extremity    POSITION  Standing    DOMINANT SIDE Right    At Risk Side Right    BASELINE SCORE (UNILATERAL) -9.3    L-DEX SCORE (UNILATERAL) 0.7    VALUE CHANGE (UNILAT) 10           Patient was assessed today using the SOZO machine to determine the lymphedema index score. This was compared to her baseline score. It was determined that she is NOT within the recommended range when compared to her baseline. She had a SOZO screen 1 month ago and was also out of recommended range, although she is now measuring almost equal in both arms. Right arm was assessed for any sign of edema and there was no **Note De-Identified Diaz Obfuscation** edema noted. Because the past 2 screens have been close to each other (although up form baseline) and pt reports no signs of edema, she would like to retest in 2 months. This seems reasonable and she knows to contact us  if things change between now and then. We discussed the possibility that her initial baseline was off for unknown reasons since she has been consistent since then. Will re-screen in 05/2024.   Eward Wonda Sharps, [place] 03/24/24 11:22 AM

## 2024-04-05 ENCOUNTER — Other Ambulatory Visit: Payer: Self-pay | Admitting: *Deleted

## 2024-04-05 DIAGNOSIS — C50411 Malignant neoplasm of upper-outer quadrant of right female breast: Secondary | ICD-10-CM

## 2024-04-05 NOTE — Progress Notes (Signed)
**Note De-Identified Balke Obfuscation** Received call from pt with complaint of right breast discomfort as well as right axilla cording.  Per MD pt needing to be seen by PT for further evaluation and tx.  Orders placed, pt notified and verbalized understanding.

## 2024-04-08 ENCOUNTER — Encounter: Payer: Self-pay | Admitting: Rehabilitation

## 2024-04-08 ENCOUNTER — Ambulatory Visit: Attending: Hematology and Oncology | Admitting: Rehabilitation

## 2024-04-08 DIAGNOSIS — Z483 Aftercare following surgery for neoplasm: Secondary | ICD-10-CM | POA: Diagnosis present

## 2024-04-08 DIAGNOSIS — Z170 Estrogen receptor positive status [ER+]: Secondary | ICD-10-CM | POA: Insufficient documentation

## 2024-04-08 DIAGNOSIS — M25611 Stiffness of right shoulder, not elsewhere classified: Secondary | ICD-10-CM | POA: Insufficient documentation

## 2024-04-08 DIAGNOSIS — C50411 Malignant neoplasm of upper-outer quadrant of right female breast: Secondary | ICD-10-CM | POA: Insufficient documentation

## 2024-04-08 NOTE — Therapy (Signed)
**Note De-Identified Bunda Obfuscation** OUTPATIENT PHYSICAL THERAPY  UPPER EXTREMITY ONCOLOGY EVALUATION  Patient Name: Felicia Schultz MRN: [id number] DOB:Feb 16, 1951, 73 y.o., female Today's Date: 04/08/2024  END OF SESSION:  PT End of Session - 04/08/24 1048     Visit Number 1    Number of Visits 5    Date for Recertification  05/06/24    Authorization Type none needed    Activity Tolerance Patient tolerated treatment well    Behavior During Therapy [facility] for tasks assessed/performed          Past Medical History:  Diagnosis Date   Allergy     Anemia    Atrial flutter (HCC)    typical appearing   Basal cell carcinoma     nose - 1985   Breast cancer (HCC)    Difficult intubation    GERD (gastroesophageal reflux disease)    GI bleed    H/O cardiac radiofrequency ablation 2015   H/O gastric bypass 2005   History of diabetes mellitus 11/13/2015   Hypokalemia    Osteoporosis 2000   Paroxysmal atrial fibrillation (HCC)    Personal history of colonic polyps - sessile serrated 11/23/2013   Personal history of radiation therapy    PONV (postoperative nausea and vomiting)    Past Surgical History:  Procedure Laterality Date   ANTERIOR CERVICAL DECOMP/DISCECTOMY FUSION N/A 11/11/2023   Procedure: ANTERIOR CERVICAL DECOMPRESSION/DISCECTOMY FUSION CERVICAL FIVE-SIX LONEY REMOVAL;  Surgeon: Mavis Purchase, MD;  Location: [facility] OR;  Service: Neurosurgery;  Laterality: N/A;  ACDF,IP,PLATE/SCREWS R43;ENDD REMOVAL OLD PLATE   APPENDECTOMY     ATRIAL FLUTTER ABLATION N/A 09/05/2014   Procedure: ATRIAL FLUTTER ABLATION;  Surgeon: Lynwood Rakers, MD;  Location: [facility] CATH LAB;  Service: Cardiovascular;  Laterality: N/A;   BIOPSY  06/23/2023   Procedure: BIOPSY;  Surgeon: Wilhelmenia Aloha Raddle., MD;  Location: [facility];  Service: Gastroenterology;;   BREAST BIOPSY Right 10/21/2023   US  RT BREAST BX W LOC DEV 1ST LESION IMG BX SPEC US  GUIDE 10/21/2023 [facility]   BREAST BIOPSY Right 10/21/2023   US  RT BREAST BX W LOC  DEV EA ADD LESION IMG BX SPEC US  GUIDE 10/21/2023 [facility]MO[facility]   BREAST BIOPSY Right 11/20/2023   US  RT RADIOACTIVE SEED EA ADD LESION 11/20/2023 [facility]   BREAST BIOPSY Right 11/20/2023   US  RT RADIOACTIVE SEED EA ADD LESION 11/20/2023 [facility]   BREAST BIOPSY  11/20/2023   US  RT RADIOACTIVE SEED LOC 11/20/2023 [facility]   BREAST EXCISIONAL BIOPSY Right 01/2016   BREAST LUMPECTOMY     BREAST LUMPECTOMY WITH RADIOACTIVE SEED LOCALIZATION Right 11/24/2023   Procedure: RADIOACTIVE SEED GUIDED RIGHT BREAST LUMPECTOMY TIMES TWO WITH MARGINS;  Surgeon: Aron Shoulders, MD;  Location: [facility];  Service: General;  Laterality: Right;   CARPAL TUNNEL RELEASE Bilateral    CHOLECYSTECTOMY     ENTEROSCOPY N/A 06/23/2023   Procedure: ENTEROSCOPY;  Surgeon: Wilhelmenia Aloha Raddle., MD;  Location: [facility];  Service: Gastroenterology;  Laterality: N/A;   FOOT SURGERY Bilateral 2012   shorten bones in both feet   GASTRIC BYPASS     MASTOPEXY Bilateral 12/03/2023   Procedure: MASTOPEXY;  Surgeon: Arelia Filippo, MD;  Location: [facility];  Service: Plastics;  Laterality: Bilateral;   REDUCTION MAMMAPLASTY     removal of ovary Right 1987   SENTINEL NODE BIOPSY Right 11/24/2023   Procedure: RADIOACTIVE SEED GUIDED RIGHT AXILLARY SENTINEL NODE BIOPSY, RIGHT AXILLARY SENTINEL LYMPH NODE BIOPSIES;  Surgeon: Aron Shoulders, MD; **Note De-Identified Nedrow Obfuscation** Location: [facility];  Service: General;  Laterality: Right;   SPINE SURGERY  2000/2012   anterior cervical fusion with plating   SUBMUCOSAL TATTOO INJECTION  06/23/2023   Procedure: SUBMUCOSAL TATTOO INJECTION;  Surgeon: Wilhelmenia Aloha Raddle., MD;  Location: [facility];  Service: Gastroenterology;;   TOTAL ABDOMINAL HYSTERECTOMY  1999   WRIST SURGERY Right 2018   Dr. Ahmad -ganglion excision   Patient Active Problem List   Diagnosis Date Noted   Status post surgery 11/11/2023   Cervical  spondylosis with radiculopathy 11/11/2023   Malignant neoplasm of upper-outer quadrant of right breast in female, estrogen receptor positive (HCC) 10/26/2023   Obesity 08/20/2023   Thyroid  nodule 06/30/2023   Vitamin B12 deficiency 08/18/2022   Tremor 08/14/2021   History of atrial fibrillation 08/03/2020   Bilateral dry eyes 03/28/2020   Vitreous membranes and strands, right 03/28/2020   Nuclear sclerotic cataract of right eye 03/28/2020   Nuclear sclerotic cataract of left eye 03/28/2020   Posterior vitreous detachment of left eye 03/28/2020   Posterior vitreous detachment of right eye 03/28/2020   Hyperglycemia 02/18/2019   S/P gastric bypass 07/09/2018   Achilles tendinosis 04/30/2018   Spondylosis of lumbar joint 05/28/2017   Plantar fasciitis, bilateral 05/28/2017   Chronic pain of right wrist 05/28/2017   Haglund's deformity of right heel 02/02/2017   Postmenopausal symptoms 06/02/2016   Cervical spondylosis 07/19/2014   GERD (gastroesophageal reflux disease) 07/19/2014   Vitamin D  deficiency 07/19/2014   History of colonic polyps 11/23/2013   Allergic rhinitis 11/11/2011    PCP: Worth Kitty, MD  REFERRING PROVIDER: Mackey Chad, MD  REFERRING DIAG: Cording  THERAPY DIAG:  Aftercare following surgery for neoplasm  Malignant neoplasm of upper-outer quadrant of right breast in female, estrogen receptor positive (HCC)  Stiffness of right shoulder, not elsewhere classified  ONSET DATE: 02/26/24  Rationale for Evaluation and Treatment: Rehabilitation  SUBJECTIVE:                                                                                                                                                                                           SUBJECTIVE STATEMENT: I first noticed a hard line in the arm when I go to pick up my purse or back pack.   PERTINENT HISTORY: Patient was diagnosed on 10/19/2023 with right grade 2 invasive ductal carcinoma breast cancer. It  measures 1.3 cm and is located in the upper outer quadrant. It is ER/PR positive and HER2 negative with a Ki67 of 5%. She has a biopsied proven positive axillary lymph node.She has had several spine surgeries including a C4-5 discectomy and fusion in **Note De-Identified Sandler Obfuscation** 2011 and a C6-7 discectomy in 2000. She has atrial fibrillation and is scheduled for an anterior cervical decompression and discectomy with fusion on 11/11/2023. She reports left arm weakness as a result of her cervical problems.  She is now s/p 11/24/2023  right lumpectomy with SLNB with 1/3 lymph nodes positive , 12/03/2023 Right onconplastic reconstruction and left breast reduction by Dr. Arelia. Drains removed on 12/10/2023.  Completed radiation 02/10/24 without significant skin changes.    PAIN:  Are you having pain? No It doesn't hurt unless I move, but I do have a general ache in the joints from the medication around 8/10  PRECAUTIONS: Other: Cervical cord compression   RED FLAGS: None   WEIGHT BEARING RESTRICTIONS: No  FALLS:  Has patient fallen in last 6 months? 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     OBJECTIVE: Note: Objective measures were completed at Evaluation unless otherwise noted.  COGNITION: Overall cognitive status: Within functional limits for tasks assessed   PALPATION: 1 very visible cord noted in the axilla with pull into the upper arm and towards the incision and 2 other cords noted with more skin stretch  OBSERVATIONS / OTHER ASSESSMENTS: cord noted with AROM in the axilla   SENSATION: WNL  POSTURE: WNL  UPPER EXTREMITY  AROM/PROM:  A/PROM RIGHT   baseline  04/08/24  Shoulder extension 42 42  Shoulder flexion 136 135  Shoulder abduction 140 132 - cord evident   Shoulder internal rotation 37 40  Shoulder external rotation 66 80    (Blank rows = not tested)  A/PROM LEFT   eval  Shoulder extension 35  Shoulder flexion 128  Shoulder abduction 145  Shoulder internal rotation 60  Shoulder external rotation 67    (Blank rows = not tested)  CERVICAL AROM: All within normal limits:    Percent limited  Flexion WNL  Extension 50% limited  Right lateral flexion 50% limited  Left lateral flexion 50% limited  Right rotation 75% limited  Left rotation 75% limited     UPPER EXTREMITY STRENGTH: Not tested due to neck pain   L-DEX LYMPHEDEMA SCREENING:Pt is currently doing SOZO screens  QUICK DASH SURVEY: 45% from 37% at baseline                                                                                                                            TREATMENT DATE:  Pt permission and consent throughout each step of examination and treatment with modification and draping if requested when working on sensitive areas. **Note De-Identified Plucinski Obfuscation** Pt is aware that a clinic chaperone is not available.   04/08/24 Eval performed briefly due to pt here recently Then STM and cording release to the Rt axilla including PROM with pinning into flexion, D2, and abduction Education on HEP per below with demo only.      PATIENT EDUCATION:  Education details: per today's note Person educated: Patient Education method: Explanation, Demonstration, and handout Education comprehension: verbalized understanding, returned demonstration, and needs further education  HOME EXERCISE PROGRAM: Access Code: [email or web address] URL: [email or web address] Date: 04/08/2024 Prepared by: Saddie Raw  Exercises - Standing Shoulder Abduction Slides at Wall  - 1 x daily - 7 x weekly - 1-3 sets - 10 reps - 15-20 seconds hold - Supine Shoulder Flexion  Extension AAROM with Dowel  - 1 x daily - 7 x weekly - 10 reps - 5 seconds hold - Doorway Pec Stretch at 120 Degrees Abduction  - 1 x daily - 7 x weekly - 1 sets - 3 reps - 30 hold  ASSESSMENT:  CLINICAL IMPRESSION: Patient is a 73 y.o. female who was seen today for physical therapy evaluation and treatment for her new onset of cording around 6 weeks ago after cleaning out 3 storage units.  She never had cording up until this point and reports she is having the most problem with 8/10 tamoxifen related pain in bil shoulders.  3 significant cords noted with pull noted even with skin stretch near the elbow creating movement in the axilla.  Her ROM is only mildly limited into abduction. She tolerated first session of MT well and reports feeling better already.      OBJECTIVE IMPAIRMENTS: decreased activity tolerance, decreased knowledge of condition, decreased knowledge of use of DME, decreased ROM, and increased fascial restrictions.   ACTIVITY LIMITATIONS: carrying and lifting  PARTICIPATION LIMITATIONS: yard work  PERSONAL FACTORS: Age, Fitness, and 1-2 comorbidities: SLNB, radiation are also affecting patient's functional outcome.   REHAB POTENTIAL: Excellent  CLINICAL DECISION MAKING: Stable/uncomplicated  EVALUATION COMPLEXITY: Low  GOALS: Goals reviewed with patient? Yes  SHORT TERM GOALS: Target date: 04/08/24  Pt will be ind with initial stretches for cording Baseline: Goal status: MET  2.  Pt will be educated on cording anatomy and physiology  Baseline:  Goal status: INITIAL   LONG TERM GOALS: Target date: 05/06/24  Pt will improve shoulder abduction to baseline of 140deg Baseline:  Goal status: INITIAL  2.  Pt will be edu on final HEP Baseline:  Goal status: INITIAL   PLAN:  PT FREQUENCY: 1x/week  PT DURATION: 4 weeks  PLANNED INTERVENTIONS: [id number]- PT Re-evaluation, 97110-Therapeutic exercises, 97530- Therapeutic activity, 97112- Neuromuscular  re-education, 97535- Self Care, [id number]- Manual therapy, Patient/Family education, Balance training, Joint mobilization, Therapeutic exercises, Therapeutic activity, Neuromuscular re-education, Gait training, and Self Care  PLAN FOR NEXT SESSION: Rt cording work , review HEP  Raw Saddie SAUNDERS, PT 04/08/2024, 10:49 AM

## 2024-04-13 ENCOUNTER — Ambulatory Visit: Admitting: Rehabilitation

## 2024-04-19 ENCOUNTER — Encounter: Payer: Self-pay | Admitting: Dermatology

## 2024-04-19 ENCOUNTER — Ambulatory Visit (INDEPENDENT_AMBULATORY_CARE_PROVIDER_SITE_OTHER): Admitting: Dermatology

## 2024-04-19 VITALS — BP 101/70 | HR 74 | Temp 98.5°F

## 2024-04-19 DIAGNOSIS — Z85828 Personal history of other malignant neoplasm of skin: Secondary | ICD-10-CM

## 2024-04-19 DIAGNOSIS — Z1283 Encounter for screening for malignant neoplasm of skin: Secondary | ICD-10-CM

## 2024-04-19 DIAGNOSIS — L814 Other melanin hyperpigmentation: Secondary | ICD-10-CM

## 2024-04-19 DIAGNOSIS — L821 Other seborrheic keratosis: Secondary | ICD-10-CM

## 2024-04-19 DIAGNOSIS — D1801 Hemangioma of skin and subcutaneous tissue: Secondary | ICD-10-CM | POA: Diagnosis not present

## 2024-04-19 DIAGNOSIS — L578 Other skin changes due to chronic exposure to nonionizing radiation: Secondary | ICD-10-CM

## 2024-04-19 DIAGNOSIS — D229 Melanocytic nevi, unspecified: Secondary | ICD-10-CM

## 2024-04-19 DIAGNOSIS — W908XXA Exposure to other nonionizing radiation, initial encounter: Secondary | ICD-10-CM

## 2024-04-19 NOTE — Progress Notes (Unsigned)
**Note De-identified Graddy Obfuscation**   **Note De-Identified Lampton Obfuscation** Total Body Skin Exam (TBSE) Visit   Subjective  Felicia Schultz is a 73 y.o. female who presents for the following: Skin Cancer Screening and Full Body Skin Exam  Felicia Schultz presents today for follow up visit for TBSE. Felicia Schultz was last evaluated on 12/01/23 . Felicia Schultz denies medication changes. Felicia Schultz reports she does have spots, moles and lesions of concern to be evaluated. Felicia Schultz reports throughout her lifetime she has had moderate sun exposure. Currently, Felicia Schultz reports if she has excessive sun exposure, she does apply sunscreen and/or wears protective coverings. Felicia Schultz reports she has hx of bx. Felicia Schultz admits to  family history of skin cancers, Felicia Schultz reports that her mother had a BCC on her nose. The Felicia Schultz has spots, moles and lesions to be evaluated, some may be new or changing.  The following portions of the chart were reviewed this encounter and updated as appropriate: medications, allergies, medical history  Review of Systems:  No other skin or systemic complaints except as noted in HPI or Assessment and Plan.  Objective  Well appearing Felicia Schultz in no apparent distress; mood and affect are within normal limits.  A full examination was performed including scalp, head, eyes, ears, nose, lips, neck, chest, axillae, abdomen, back, buttocks, bilateral upper extremities, bilateral lower extremities, hands, feet, fingers, toes, fingernails, and toenails. All findings within normal limits unless otherwise noted below.   Relevant physical exam findings are noted in the Assessment and Plan.    Assessment & Plan   LENTIGINES, SEBORRHEIC KERATOSES, HEMANGIOMAS - Benign normal skin lesions - Benign-appearing - Call for any changes  MELANOCYTIC NEVI - Tan-brown and/or pink-flesh-colored symmetric macules and papules - Benign appearing on exam today - Observation - Call clinic for new or changing moles - Recommend daily use of broad spectrum spf 30+ sunscreen to sun-exposed areas.    ACTINIC DAMAGE - Chronic condition, secondary to cumulative UV/sun exposure - diffuse scaly erythematous macules with underlying dyspigmentation - Recommend daily broad spectrum sunscreen SPF 30+ to sun-exposed areas, reapply every 2 hours as needed.  - Staying in the shade or wearing long sleeves, sun glasses (UVA+UVB protection) and wide brim hats (4-inch brim around the entire circumference of the hat) are also recommended for sun protection.  - Call for new or changing lesions.  SKIN CANCER SCREENING PERFORMED TODAY.  HISTORY OF BASAL CELL CARCINOMA OF THE SKIN - No evidence of recurrence today - Recommend regular full body skin exams - Recommend daily broad spectrum sunscreen SPF 30+ to sun-exposed areas, reapply every 2 hours as needed.  - Call if any new or changing lesions are noted between office visits    Return in about 6 months (around 10/17/2024) for TBSE follow up.  I, Doyce Pan, CMA, am acting as scribe for RUFUS HERO HOLY, MD.   Documentation: I have reviewed the above documentation for accuracy and completeness, and I agree with the above.  RUFUS HERO HOLY, MD

## 2024-04-19 NOTE — Patient Instructions (Signed)

## 2024-04-20 ENCOUNTER — Encounter: Payer: Self-pay | Admitting: Rehabilitation

## 2024-04-20 ENCOUNTER — Ambulatory Visit: Attending: Hematology and Oncology | Admitting: Rehabilitation

## 2024-04-20 DIAGNOSIS — Z170 Estrogen receptor positive status [ER+]: Secondary | ICD-10-CM | POA: Insufficient documentation

## 2024-04-20 DIAGNOSIS — Z483 Aftercare following surgery for neoplasm: Secondary | ICD-10-CM | POA: Insufficient documentation

## 2024-04-20 DIAGNOSIS — M25611 Stiffness of right shoulder, not elsewhere classified: Secondary | ICD-10-CM | POA: Insufficient documentation

## 2024-04-20 DIAGNOSIS — R293 Abnormal posture: Secondary | ICD-10-CM | POA: Insufficient documentation

## 2024-04-20 DIAGNOSIS — C50411 Malignant neoplasm of upper-outer quadrant of right female breast: Secondary | ICD-10-CM | POA: Diagnosis present

## 2024-04-20 NOTE — Therapy (Signed)
**Note De-Identified Jakubek Obfuscation** OUTPATIENT PHYSICAL THERAPY  UPPER EXTREMITY ONCOLOGY EVALUATION  Patient Name: Felicia Schultz MRN: [id number] DOB:Dec 16, 1950, 73 y.o., female Today's Date: 04/20/2024  END OF SESSION:  PT End of Session - 04/20/24 1001     Visit Number 2    Number of Visits 5    Date for Recertification  05/06/24    Authorization Type none needed    PT Start Time 1001    PT Stop Time 1050    PT Time Calculation (min) 49 min    Activity Tolerance Patient tolerated treatment well    Behavior During Therapy [facility] for tasks assessed/performed           Past Medical History:  Diagnosis Date   Allergy     Anemia    Atrial flutter (HCC)    typical appearing   Basal cell carcinoma     nose - 1985   Breast cancer (HCC)    Difficult intubation    GERD (gastroesophageal reflux disease)    GI bleed    H/O cardiac radiofrequency ablation 2015   H/O gastric bypass 2005   History of diabetes mellitus 11/13/2015   Hypokalemia    Osteoporosis 2000   Paroxysmal atrial fibrillation (HCC)    Personal history of colonic polyps - sessile serrated 11/23/2013   Personal history of radiation therapy    PONV (postoperative nausea and vomiting)    Past Surgical History:  Procedure Laterality Date   ANTERIOR CERVICAL DECOMP/DISCECTOMY FUSION N/A 11/11/2023   Procedure: ANTERIOR CERVICAL DECOMPRESSION/DISCECTOMY FUSION CERVICAL FIVE-SIX LONEY REMOVAL;  Surgeon: Mavis Purchase, MD;  Location: [facility];  Service: Neurosurgery;  Laterality: N/A;  ACDF,IP,PLATE/SCREWS R43;ENDD REMOVAL OLD PLATE   APPENDECTOMY     ATRIAL FLUTTER ABLATION N/A 09/05/2014   Procedure: ATRIAL FLUTTER ABLATION;  Surgeon: Lynwood Rakers, MD;  Location: [facility];  Service: Cardiovascular;  Laterality: N/A;   BIOPSY  06/23/2023   Procedure: BIOPSY;  Surgeon: Wilhelmenia Aloha Raddle., MD;  Location: [facility];  Service: Gastroenterology;;   BREAST BIOPSY Right 10/21/2023   US  RT BREAST BX W LOC DEV 1ST LESION IMG BX SPEC US  GUIDE  10/21/2023 [facility]   BREAST BIOPSY Right 10/21/2023   US  RT BREAST BX W LOC DEV EA ADD LESION IMG BX SPEC US  GUIDE 10/21/2023 [facility]   BREAST BIOPSY Right 11/20/2023   US  RT RADIOACTIVE SEED EA ADD LESION 11/20/2023 [facility]   BREAST BIOPSY Right 11/20/2023   US  RT RADIOACTIVE SEED EA ADD LESION 11/20/2023 [facility]   BREAST BIOPSY  11/20/2023   US  RT RADIOACTIVE SEED LOC 11/20/2023 [facility]   BREAST EXCISIONAL BIOPSY Right 01/2016   BREAST LUMPECTOMY     BREAST LUMPECTOMY WITH RADIOACTIVE SEED LOCALIZATION Right 11/24/2023   Procedure: RADIOACTIVE SEED GUIDED RIGHT BREAST LUMPECTOMY TIMES TWO WITH MARGINS;  Surgeon: Aron Shoulders, MD;  Location: [facility];  Service: General;  Laterality: Right;   CARPAL TUNNEL RELEASE Bilateral    CHOLECYSTECTOMY     ENTEROSCOPY N/A 06/23/2023   Procedure: ENTEROSCOPY;  Surgeon: Wilhelmenia Aloha Raddle., MD;  Location: [facility];  Service: Gastroenterology;  Laterality: N/A;   FOOT SURGERY Bilateral 2012   shorten bones in both feet   GASTRIC BYPASS     MASTOPEXY Bilateral 12/03/2023   Procedure: MASTOPEXY;  Surgeon: Arelia Filippo, MD;  Location: [facility];  Service: Plastics;  Laterality: Bilateral;   REDUCTION MAMMAPLASTY     removal of ovary Right 1987   SENTINEL NODE BIOPSY Right **Note De-Identified Etcheverry Obfuscation** 11/24/2023   Procedure: RADIOACTIVE SEED GUIDED RIGHT AXILLARY SENTINEL NODE BIOPSY, RIGHT AXILLARY SENTINEL LYMPH NODE BIOPSIES;  Surgeon: Aron Shoulders, MD;  Location: [facility];  Service: General;  Laterality: Right;   SPINE SURGERY  2000/2012   anterior cervical fusion with plating   SUBMUCOSAL TATTOO INJECTION  06/23/2023   Procedure: SUBMUCOSAL TATTOO INJECTION;  Surgeon: Wilhelmenia Aloha Raddle., MD;  Location: [facility];  Service: Gastroenterology;;   TOTAL ABDOMINAL HYSTERECTOMY  1999   WRIST SURGERY Right 2018   Dr. Ahmad -ganglion excision   Patient Active  Problem List   Diagnosis Date Noted   Status post surgery 11/11/2023   Cervical spondylosis with radiculopathy 11/11/2023   Malignant neoplasm of upper-outer quadrant of right breast in female, estrogen receptor positive (HCC) 10/26/2023   Obesity 08/20/2023   Thyroid  nodule 06/30/2023   Vitamin B12 deficiency 08/18/2022   Tremor 08/14/2021   History of atrial fibrillation 08/03/2020   Bilateral dry eyes 03/28/2020   Vitreous membranes and strands, right 03/28/2020   Nuclear sclerotic cataract of right eye 03/28/2020   Nuclear sclerotic cataract of left eye 03/28/2020   Posterior vitreous detachment of left eye 03/28/2020   Posterior vitreous detachment of right eye 03/28/2020   Hyperglycemia 02/18/2019   S/P gastric bypass 07/09/2018   Achilles tendinosis 04/30/2018   Spondylosis of lumbar joint 05/28/2017   Plantar fasciitis, bilateral 05/28/2017   Chronic pain of right wrist 05/28/2017   Haglund's deformity of right heel 02/02/2017   Postmenopausal symptoms 06/02/2016   Cervical spondylosis 07/19/2014   GERD (gastroesophageal reflux disease) 07/19/2014   Vitamin D  deficiency 07/19/2014   History of colonic polyps 11/23/2013   Allergic rhinitis 11/11/2011    PCP: Worth Kitty, MD  REFERRING PROVIDER: Mackey Chad, MD  REFERRING DIAG: Cording  THERAPY DIAG:  Aftercare following surgery for neoplasm  Malignant neoplasm of upper-outer quadrant of right breast in female, estrogen receptor positive (HCC)  Stiffness of right shoulder, not elsewhere classified  Abnormal posture  ONSET DATE: 02/26/24  Rationale for Evaluation and Treatment: Rehabilitation  SUBJECTIVE:                                                                                                                                                                                           SUBJECTIVE STATEMENT:  Patient reports that she is feeling a lot better and has not been able to feel the cording herself.  She has been compliant with her HEP. The patient reports that while she is feeling better she would like to come for one more visit to make sure the cording is all the way gone. **Note De-Identified Ator Obfuscation** Eval: I first noticed a hard line in the arm when I go to pick up my purse or back pack.   PERTINENT HISTORY: Patient was diagnosed on 10/19/2023 with right grade 2 invasive ductal carcinoma breast cancer. It measures 1.3 cm and is located in the upper outer quadrant. It is ER/PR positive and HER2 negative with a Ki67 of 5%. She has a biopsied proven positive axillary lymph node.She has had several spine surgeries including a C4-5 discectomy and fusion in 2011 and a C6-7 discectomy in 2000. She has atrial fibrillation and is scheduled for an anterior cervical decompression and discectomy with fusion on 11/11/2023. She reports left arm weakness as a result of her cervical problems.  She is now s/p 11/24/2023  right lumpectomy with SLNB with 1/3 lymph nodes positive , 12/03/2023 Right onconplastic reconstruction and left breast reduction by Dr. Arelia. Drains removed on 12/10/2023.  Completed radiation 02/10/24 without significant skin changes.    PAIN:  Are you having pain? No It doesn't hurt unless I move, but I do have a general ache in the joints from the medication around 8/10  PRECAUTIONS: Other: Cervical cord compression   RED FLAGS: None   WEIGHT BEARING RESTRICTIONS: No  FALLS:  Has patient fallen in last 6 months? 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     OBJECTIVE: Note: Objective measures were completed at Evaluation unless  otherwise noted.  COGNITION: Overall cognitive status: Within functional limits for tasks assessed   PALPATION: 1 very visible cord noted in the axilla with pull into the upper arm and towards the incision and 2 other cords noted with more skin stretch  OBSERVATIONS / OTHER ASSESSMENTS: cord noted with AROM in the axilla   SENSATION: WNL  POSTURE: WNL  UPPER EXTREMITY AROM/PROM:  A/PROM RIGHT   baseline  04/08/24 04/20/24  Shoulder extension 42 42   Shoulder flexion 136 135 150  Shoulder abduction 140 132 - cord evident  140 - cord evident  Shoulder internal rotation 37 40   Shoulder external rotation 66 80     (Blank rows = not tested)  A/PROM LEFT   eval  Shoulder extension 35  Shoulder flexion 128  Shoulder abduction 145  Shoulder internal rotation 60  Shoulder external rotation 67    (Blank rows = not tested)  CERVICAL AROM: All within normal limits:    Percent limited  Flexion WNL  Extension 50% limited  Right lateral flexion 50% limited  Left lateral flexion 50% limited  Right rotation 75% limited  Left rotation 75% limited     UPPER EXTREMITY STRENGTH: Not tested due to neck pain   L-DEX LYMPHEDEMA SCREENING:Pt is currently doing SOZO screens  QUICK DASH SURVEY: 45% from 37% at baseline **Note De-Identified Hagins Obfuscation** TREATMENT DATE:  Pt permission and consent throughout each step of examination and treatment with modification and draping if requested when working on sensitive areas.  04/08/24 Therapeutic Exercise: Re-measured ROM Standing shoulder abduction wall walks x 10 R only Supine shoulder flexion with dowel Doorway pec stretch 3 x 20 sec hold Manual Therapy:  STM to cording at axilla including PROM with pinning into flexion, D2, and abduction PROM into flexion, abduction and ER  04/08/24 Eval performed briefly due to pt here recently Then STM and  cording release to the Rt axilla including PROM with pinning into flexion, D2, and abduction Education on HEP per below with demo only.      PATIENT EDUCATION:  Education details: per today's note Person educated: Patient Education method: Explanation, Demonstration, and handout Education comprehension: verbalized understanding, returned demonstration, and needs further education  HOME EXERCISE PROGRAM: Access Code: [email or web address] URL: [email or web address] Date: 04/08/2024 Prepared by: Saddie Raw  Exercises - Standing Shoulder Abduction Slides at Wall  - 1 x daily - 7 x weekly - 1-3 sets - 10 reps - 15-20 seconds hold - Supine Shoulder Flexion Extension AAROM with Dowel  - 1 x daily - 7 x weekly - 10 reps - 5 seconds hold - Doorway Pec Stretch at 120 Degrees Abduction  - 1 x daily - 7 x weekly - 1 sets - 3 reps - 30 hold  ASSESSMENT:  CLINICAL IMPRESSION: Patient tolerates the continuation of manual therapy to address cording at the axilla. Patient's ROM has improved and is back to baseline; she also reports she is feeling a lot better. However, cording is still observed during shoulder abduction and the patient reports feeling a slight pulling sensation. Because of this, the patient verbalizes that she would like to come in for one more visit to ensure the cording is all the way gone but she will call and cancel ahead of time if it improves on its own over the weekend.   OBJECTIVE IMPAIRMENTS: decreased activity tolerance, decreased knowledge of condition, decreased knowledge of use of DME, decreased ROM, and increased fascial restrictions.   ACTIVITY LIMITATIONS: carrying and lifting  PARTICIPATION LIMITATIONS: yard work  PERSONAL FACTORS: Age, Fitness, and 1-2 comorbidities: SLNB, radiation are also affecting patient's functional outcome.   REHAB POTENTIAL: Excellent  CLINICAL DECISION MAKING: Stable/uncomplicated  EVALUATION COMPLEXITY: Low  GOALS: Goals  reviewed with patient? Yes  SHORT TERM GOALS: Target date: 04/08/24  Pt will be ind with initial stretches for cording Baseline: Goal status: MET  2.  Pt will be educated on cording anatomy and physiology  Baseline:  Goal status: INITIAL   LONG TERM GOALS: Target date: 05/06/24  Pt will improve shoulder abduction to baseline of 140deg Baseline:  Goal status: INITIAL  2.  Pt will be edu on final HEP Baseline:  Goal status: INITIAL   PLAN:  PT FREQUENCY: 1x/week  PT DURATION: 4 weeks  PLANNED INTERVENTIONS: 97164- PT Re-evaluation, 97110-Therapeutic exercises, 97530- Therapeutic activity, 97112- Neuromuscular re-education, 97535- Self Care, [id number]- Manual therapy, Patient/Family education, Balance training, Joint mobilization, Therapeutic exercises, Therapeutic activity, Neuromuscular re-education, Gait training, and Self Care  PLAN FOR NEXT SESSION: Rt cording work , review HEP  Randall Pack, SPT  04/20/2024, 11:14 AM  I agree with the following treatment note after reviewing documentation. This session was performed under the supervision of a licensed clinician.  Saddie Raw, PT 04/20/24, 11:16 AM

## 2024-04-25 ENCOUNTER — Inpatient Hospital Stay: Attending: Licensed Clinical Social Worker | Admitting: Adult Health

## 2024-04-25 ENCOUNTER — Encounter: Payer: Self-pay | Admitting: Adult Health

## 2024-04-25 VITALS — BP 133/57 | HR 80 | Temp 97.9°F | Resp 17 | Wt 178.3 lb

## 2024-04-25 DIAGNOSIS — Z801 Family history of malignant neoplasm of trachea, bronchus and lung: Secondary | ICD-10-CM | POA: Insufficient documentation

## 2024-04-25 DIAGNOSIS — Z860101 Personal history of adenomatous and serrated colon polyps: Secondary | ICD-10-CM | POA: Insufficient documentation

## 2024-04-25 DIAGNOSIS — Z79899 Other long term (current) drug therapy: Secondary | ICD-10-CM | POA: Diagnosis not present

## 2024-04-25 DIAGNOSIS — Z170 Estrogen receptor positive status [ER+]: Secondary | ICD-10-CM | POA: Insufficient documentation

## 2024-04-25 DIAGNOSIS — Z1732 Human epidermal growth factor receptor 2 negative status: Secondary | ICD-10-CM | POA: Diagnosis not present

## 2024-04-25 DIAGNOSIS — Z880 Allergy status to penicillin: Secondary | ICD-10-CM | POA: Insufficient documentation

## 2024-04-25 DIAGNOSIS — Z90710 Acquired absence of both cervix and uterus: Secondary | ICD-10-CM | POA: Diagnosis not present

## 2024-04-25 DIAGNOSIS — Z9049 Acquired absence of other specified parts of digestive tract: Secondary | ICD-10-CM | POA: Insufficient documentation

## 2024-04-25 DIAGNOSIS — Z882 Allergy status to sulfonamides status: Secondary | ICD-10-CM | POA: Insufficient documentation

## 2024-04-25 DIAGNOSIS — Z8419 Family history of other disorders of kidney and ureter: Secondary | ICD-10-CM | POA: Diagnosis not present

## 2024-04-25 DIAGNOSIS — Z886 Allergy status to analgesic agent status: Secondary | ICD-10-CM | POA: Insufficient documentation

## 2024-04-25 DIAGNOSIS — Z881 Allergy status to other antibiotic agents status: Secondary | ICD-10-CM | POA: Diagnosis not present

## 2024-04-25 DIAGNOSIS — R5383 Other fatigue: Secondary | ICD-10-CM | POA: Insufficient documentation

## 2024-04-25 DIAGNOSIS — Z818 Family history of other mental and behavioral disorders: Secondary | ICD-10-CM | POA: Insufficient documentation

## 2024-04-25 DIAGNOSIS — Z923 Personal history of irradiation: Secondary | ICD-10-CM | POA: Diagnosis not present

## 2024-04-25 DIAGNOSIS — Z9884 Bariatric surgery status: Secondary | ICD-10-CM | POA: Diagnosis not present

## 2024-04-25 DIAGNOSIS — Z833 Family history of diabetes mellitus: Secondary | ICD-10-CM | POA: Insufficient documentation

## 2024-04-25 DIAGNOSIS — Z13820 Encounter for screening for osteoporosis: Secondary | ICD-10-CM

## 2024-04-25 DIAGNOSIS — M810 Age-related osteoporosis without current pathological fracture: Secondary | ICD-10-CM | POA: Insufficient documentation

## 2024-04-25 DIAGNOSIS — Z1721 Progesterone receptor positive status: Secondary | ICD-10-CM | POA: Insufficient documentation

## 2024-04-25 DIAGNOSIS — Z85828 Personal history of other malignant neoplasm of skin: Secondary | ICD-10-CM | POA: Insufficient documentation

## 2024-04-25 DIAGNOSIS — N6310 Unspecified lump in the right breast, unspecified quadrant: Secondary | ICD-10-CM | POA: Insufficient documentation

## 2024-04-25 DIAGNOSIS — C50411 Malignant neoplasm of upper-outer quadrant of right female breast: Secondary | ICD-10-CM | POA: Diagnosis present

## 2024-04-25 DIAGNOSIS — Z823 Family history of stroke: Secondary | ICD-10-CM | POA: Insufficient documentation

## 2024-04-25 DIAGNOSIS — Z800 Family history of malignant neoplasm of digestive organs: Secondary | ICD-10-CM | POA: Insufficient documentation

## 2024-04-25 DIAGNOSIS — Z822 Family history of deafness and hearing loss: Secondary | ICD-10-CM | POA: Diagnosis not present

## 2024-04-25 DIAGNOSIS — Z83438 Family history of other disorder of lipoprotein metabolism and other lipidemia: Secondary | ICD-10-CM | POA: Insufficient documentation

## 2024-04-25 DIAGNOSIS — Z825 Family history of asthma and other chronic lower respiratory diseases: Secondary | ICD-10-CM | POA: Insufficient documentation

## 2024-04-25 DIAGNOSIS — Z79811 Long term (current) use of aromatase inhibitors: Secondary | ICD-10-CM | POA: Insufficient documentation

## 2024-04-25 DIAGNOSIS — Z8261 Family history of arthritis: Secondary | ICD-10-CM | POA: Insufficient documentation

## 2024-04-25 DIAGNOSIS — Z811 Family history of alcohol abuse and dependence: Secondary | ICD-10-CM | POA: Diagnosis not present

## 2024-04-26 ENCOUNTER — Other Ambulatory Visit: Payer: Self-pay | Admitting: General Surgery

## 2024-04-26 DIAGNOSIS — N6489 Other specified disorders of breast: Secondary | ICD-10-CM

## 2024-04-26 NOTE — Progress Notes (Signed)
**Note De-Identified Kayes Obfuscation** SURVIVORSHIP VISIT:  BRIEF ONCOLOGIC HISTORY:  Oncology History  Malignant neoplasm of upper-outer quadrant of right breast in female, estrogen receptor positive (HCC)  10/21/2023 Initial Diagnosis   Palpable right breast lump: 2 masses: Anterior mass 2.4 cm (intraductal papilloma previously resected 2017), posterior mass 1.3 cm grade 2 IDC with apocrine features ER 100%, PR 95%, HER2 negative, Ki-67 5%, 1 enlarged lymph node: Biopsy positive   10/28/2023 Cancer Staging   Staging form: Breast, AJCC 8th Edition - Clinical: Stage IB (cT1c, cN1, cM0, G2, ER+, PR+, HER2-) - Signed by Odean Potts, MD on 10/28/2023 Stage prefix: Initial diagnosis Histologic grading system: 3 grade system   10/2023 -  Anti-estrogen oral therapy   Anastrozole    11/06/2023 Imaging   CT and bone scan: No distant mets    11/24/2023 Surgery   Right lumpectomy: Grade 2 IDC 0.5 cm with IDC papilloma 6 mm, margins negative, LVI not identified, 1/3 lymph node positive, ER 100%, PR 95%, Ki-67 5%, HER2 negative by FISH   11/24/2023 Oncotype testing   2/1%   11/24/2023 Cancer Staging   Staging form: Breast, AJCC 8th Edition - Pathologic stage from 11/24/2023: Stage IA (pT1a, pN1a, cM0, G2, ER+, PR+, HER2-, Oncotype DX score: 2) - Signed by Crawford Morna Pickle, NP on 04/25/2024 Stage prefix: Initial diagnosis Multigene prognostic tests performed: Oncotype DX Recurrence score range: Less than 11 Histologic grading system: 3 grade system   01/07/2024 - 02/10/2024 Radiation Therapy   Plan Name: Breast_R Site: Breast, Right Technique: 3D Mode: Photon Dose Per Fraction: 2 Gy Prescribed Dose (Delivered / Prescribed): 50 Gy / 50 Gy Prescribed Fxs (Delivered / Prescribed): 25 / 25   Plan Name: Breast_R_ScvP Site: Breast, Right Technique: 3D Mode: Photon Dose Per Fraction: 2 Gy Prescribed Dose (Delivered / Prescribed): 50 Gy / 50 Gy Prescribed Fxs (Delivered / Prescribed): 25 / 25     INTERVAL HISTORY:  Discussed  the use of AI scribe software for clinical note transcription with the patient, who gave verbal consent to proceed.  History of Present Illness Felicia Schultz is a 73 year old female with stage 1A invasive ductal carcinoma of the right breast who presents for a survivorship visit post-cancer treatment.  She was diagnosed with stage 1A invasive ductal carcinoma of the right breast, estrogen and progesterone receptor positive. She underwent a lumpectomy with removal of three lymph nodes, one of which was positive. She is on anastrozole , experiencing joint aches, pain, and fatigue.  She has a history of radiation therapy with side effects of tiredness, skin discoloration, and breast swelling. She is at risk for lymphedema due to the lumpectomy and radiation.  A new lump in her breast was evaluated with a mammogram and was consistent with seroma.  She feels it is getting bigger. She is monitoring for changes in size or pain. Cording has improved with physical therapy.  She is due for a bone density test, scheduled at a different location. She follows up with regular mammograms and bone density testing as part of her post-treatment surveillance. No cough, chest pain, or palpitations, but occasional shortness of breath attributed to fatigue.    REVIEW OF SYSTEMS:  Review of Systems  Constitutional:  Positive for fatigue. Negative for appetite change, chills, fever and unexpected weight change.  HENT:   Negative for hearing loss, lump/mass and trouble swallowing.   Eyes:  Negative for eye problems and icterus.  Respiratory:  Negative for chest tightness, cough and shortness of breath.   Cardiovascular: **Note De-Identified Screws Obfuscation** Negative for chest pain, leg swelling and palpitations.  Gastrointestinal:  Negative for abdominal distention, abdominal pain, constipation, diarrhea, nausea and vomiting.  Endocrine: Negative for hot flashes.  Genitourinary:  Negative for difficulty urinating.   Musculoskeletal:  Positive for  arthralgias.  Skin:  Negative for itching and rash.  Neurological:  Negative for dizziness, extremity weakness, headaches and numbness.  Hematological:  Negative for adenopathy. Does not bruise/bleed easily.  Psychiatric/Behavioral:  Negative for depression. The patient is not nervous/anxious.    Breast: Denies any new nodularity, masses, tenderness, nipple changes, or nipple discharge.       PAST MEDICAL/SURGICAL HISTORY:  Past Medical History:  Diagnosis Date   Allergy     Anemia    Atrial flutter (HCC)    typical appearing   Basal cell carcinoma     nose - 1985   Breast cancer (HCC)    Difficult intubation    GERD (gastroesophageal reflux disease)    GI bleed    H/O cardiac radiofrequency ablation 2015   H/O gastric bypass 2005   History of diabetes mellitus 11/13/2015   Hypokalemia    Osteoporosis 2000   Paroxysmal atrial fibrillation (HCC)    Personal history of colonic polyps - sessile serrated 11/23/2013   Personal history of radiation therapy    PONV (postoperative nausea and vomiting)    Past Surgical History:  Procedure Laterality Date   ANTERIOR CERVICAL DECOMP/DISCECTOMY FUSION N/A 11/11/2023   Procedure: ANTERIOR CERVICAL DECOMPRESSION/DISCECTOMY FUSION CERVICAL FIVE-SIX LONEY REMOVAL;  Surgeon: Mavis Purchase, MD;  Location: [facility];  Service: Neurosurgery;  Laterality: N/A;  ACDF,IP,PLATE/SCREWS R43;ENDD REMOVAL OLD PLATE   APPENDECTOMY     ATRIAL FLUTTER ABLATION N/A 09/05/2014   Procedure: ATRIAL FLUTTER ABLATION;  Surgeon: Lynwood Rakers, MD;  Location: [facility];  Service: Cardiovascular;  Laterality: N/A;   BIOPSY  06/23/2023   Procedure: BIOPSY;  Surgeon: Wilhelmenia Aloha Raddle., MD;  Location: [facility];  Service: Gastroenterology;;   BREAST BIOPSY Right 10/21/2023   US  RT BREAST BX W LOC DEV 1ST LESION IMG BX SPEC US  GUIDE 10/21/2023 [facility]MO[facility]   BREAST BIOPSY Right 10/21/2023   US  RT BREAST BX W LOC DEV EA ADD LESION IMG BX SPEC US   GUIDE 10/21/2023 [facility]MO[facility]   BREAST BIOPSY Right 11/20/2023   US  RT RADIOACTIVE SEED EA ADD LESION 11/20/2023 [facility]   BREAST BIOPSY Right 11/20/2023   US  RT RADIOACTIVE SEED EA ADD LESION 11/20/2023 [facility]   BREAST BIOPSY  11/20/2023   US  RT RADIOACTIVE SEED LOC 11/20/2023 [facility]   BREAST EXCISIONAL BIOPSY Right 01/2016   BREAST LUMPECTOMY     BREAST LUMPECTOMY WITH RADIOACTIVE SEED LOCALIZATION Right 11/24/2023   Procedure: RADIOACTIVE SEED GUIDED RIGHT BREAST LUMPECTOMY TIMES TWO WITH MARGINS;  Surgeon: Aron Shoulders, MD;  Location: [facility];  Service: General;  Laterality: Right;   CARPAL TUNNEL RELEASE Bilateral    CHOLECYSTECTOMY     ENTEROSCOPY N/A 06/23/2023   Procedure: ENTEROSCOPY;  Surgeon: Wilhelmenia Aloha Raddle., MD;  Location: [facility];  Service: Gastroenterology;  Laterality: N/A;   FOOT SURGERY Bilateral 2012   shorten bones in both feet   GASTRIC BYPASS     MASTOPEXY Bilateral 12/03/2023   Procedure: MASTOPEXY;  Surgeon: Arelia Filippo, MD;  Location: [facility];  Service: Plastics;  Laterality: Bilateral;   REDUCTION MAMMAPLASTY     removal of ovary Right 1987   SENTINEL NODE BIOPSY Right 11/24/2023   Procedure: RADIOACTIVE SEED GUIDED RIGHT **Note De-Identified [place] Obfuscation** AXILLARY SENTINEL NODE BIOPSY, RIGHT AXILLARY SENTINEL LYMPH NODE BIOPSIES;  Surgeon: Aron Shoulders, MD;  Location: [facility];  Service: General;  Laterality: Right;   SPINE SURGERY  2000/2012   anterior cervical fusion with plating   SUBMUCOSAL TATTOO INJECTION  06/23/2023   Procedure: SUBMUCOSAL TATTOO INJECTION;  Surgeon: Wilhelmenia Aloha Raddle., MD;  Location: [facility];  Service: Gastroenterology;;   TOTAL ABDOMINAL HYSTERECTOMY  1999   WRIST SURGERY Right 2018   Dr. Ahmad -ganglion excision     ALLERGIES:  Allergies  Allergen Reactions   Amoxicillin -Pot Clavulanate Nausea Only    Severe nausea. Couldn't keep medication  down  Severe nausea. Couldn't keep medication down   Clarithromycin Hives   Nitrofurantoin Other (See Comments) and Rash    Unknown Palms and hand itching, turned bright red per pt   Sulfonamide Derivatives Other (See Comments)    Abdominal cramping, nausea   Azithromycin  Other (See Comments)    Z-pack, doesn't do anything Z-pack, doesn't do anything Z-pack, doesn't do anything Z-pack, doesn't do anything    Nsaids Other (See Comments)    Gastric bleeding 06/2023    Sulfa Antibiotics Nausea And Vomiting and Other (See Comments)    Unknown   Other Hives and Rash    Steri strips     CURRENT MEDICATIONS:  Outpatient Encounter Medications as of 04/25/2024  Medication Sig   acetaminophen  (TYLENOL ) 500 MG tablet Take 1,000 mg by mouth daily as needed (pain.).   anastrozole  (ARIMIDEX ) 1 MG tablet Take 1 tablet (1 mg total) by mouth daily.   citalopram  (CELEXA ) 40 MG tablet Take 1 tablet (40 mg total) by mouth daily.   Cyanocobalamin  (VITAMIN B 12 PO) Take 1 tablet by mouth every evening.   cyclobenzaprine  (FLEXERIL ) 10 MG tablet Take 1 tablet (10 mg total) by mouth 3 (three) times daily as needed for muscle spasms.   estradiol  (ESTRACE ) 0.1 MG/GM vaginal cream Place 1 Applicatorful vaginally at bedtime.   ferrous sulfate  325 (65 FE) MG tablet Take 325 mg by mouth 3 (three) times a week.   gabapentin  (NEURONTIN ) 100 MG capsule Take 100 mg by mouth daily as needed (pain).   gabapentin  (NEURONTIN ) 300 MG capsule Take 300 mg by mouth daily as needed (pain).   gabapentin  (NEURONTIN ) 300 MG capsule Take 1 capsule (300 mg total) by mouth every 8 (eight) hours as needed. (Patient taking differently: Take 100 mg by mouth every 8 (eight) hours as needed. When working.)   Multiple Vitamins-Minerals (MULTIVITAMIN GUMMIES ADULT PO) Take 1 tablet by mouth every evening.   omeprazole  (PRILOSEC) 40 MG capsule Take 1 capsule (40 mg total) by mouth daily before breakfast. 30 mins before breakfast    propranolol  ER (INDERAL  LA) 60 MG 24 hr capsule Take 1 capsule (60 mg total) by mouth daily.   Vitamin D , Ergocalciferol , (DRISDOL ) 1.25 MG (50000 UNIT) CAPS capsule Take 50000IU once weekly.   No facility-administered encounter medications on file as of 04/25/2024.     ONCOLOGIC FAMILY HISTORY:  Family History  Problem Relation Age of Onset   Diabetes Mother    Heart disease Mother    Arthritis Mother    Hearing loss Mother    Hyperlipidemia Mother    Hypertension Mother    Kidney disease Mother    Learning disabilities Mother    Miscarriages / Stillbirths Mother    Heart disease Father    Hypertension Father    Alcohol abuse Father    Hyperlipidemia Father **Note De-Identified Buendia Obfuscation** Scleroderma Sister    Early death Sister        schlederma   Miscarriages / Stillbirths Sister    Emphysema Sister    Diabetes Sister    Hyperlipidemia Sister    Hypertension Sister    Mental illness Sister    Miscarriages / Stillbirths Sister    Stroke Sister    Diabetes Sister    Heart disease Brother    Cancer Brother        lung   Stroke Brother    Lung cancer Brother        smoker   Diabetes Brother    Emphysema Maternal Grandmother    Cancer Maternal Grandmother        throat and lung(smoker)   Esophageal cancer Maternal Grandmother    Colon cancer Maternal Grandfather    Pancreatic cancer Neg Hx    Rectal cancer Neg Hx    Stomach cancer Neg Hx      SOCIAL HISTORY:  Social History   Socioeconomic History   Marital status: Married    Spouse name: Not on file   Number of children: 0   Years of education: Not on file   Highest education level: Not on file  Occupational History   Occupation: Designer, Fashion/clothing: [employer]  Tobacco Use   Smoking status: Never   Smokeless tobacco: Never  Vaping Use   Vaping status: Never Used  Substance and Sexual Activity   Alcohol use: No   Drug use: No   Sexual activity: Not on file  Other Topics Concern   Not on file   Social History Narrative   Married, no children   Therapist, sports for [employer].   Lives in [place].   1-2 caffeine/day   Social Drivers of Corporate Investment Banker Strain: Not on file  Food Insecurity: No Food Insecurity (04/25/2024)   Hunger Vital Sign    Worried About Running Out of Food in the Last Year: Never true    Ran Out of Food in the Last Year: Never true  Transportation Needs: No Transportation Needs (04/25/2024)   PRAPARE - Administrator, Civil Service (Medical): No    Lack of Transportation (Non-Medical): No  Physical Activity: Insufficiently Active (04/25/2024)   Exercise Vital Sign    Days of Exercise per Week: 2 days    Minutes of Exercise per Session: 30 min  Stress: Not on file  Social Connections: Unknown (04/25/2024)   Social Connection and Isolation Panel    Frequency of Communication with Friends and Family: Never    Frequency of Social Gatherings with Friends and Family: Never    Attends Religious Services: Never    Database Administrator or Organizations: Not on file    Attends Banker Meetings: Not on file    Marital Status: Not on file  Intimate Partner Violence: Not At Risk (04/25/2024)   Humiliation, Afraid, Rape, and Kick questionnaire    Fear of Current or Ex-Partner: No    Emotionally Abused: No    Physically Abused: No    Sexually Abused: No     OBSERVATIONS/OBJECTIVE:  BP (!) 133/57 (BP Location: Left Arm, Patient Position: Sitting)   Pulse 80   Temp 97.9 F (36.6 C) (Temporal)   Resp 17   Wt 178 lb 4.8 oz (80.9 kg)   SpO2 96%   BMI 30.61 kg/m  GENERAL: Patient is a well appearing female in **Note De-Identified Fiser Obfuscation** no acute distress HEENT:  Sclerae anicteric.  Oropharynx clear and moist. No ulcerations or evidence of oropharyngeal candidiasis. Neck is supple.  NODES:  No cervical, supraclavicular, or axillary lymphadenopathy palpated.  BREAST EXAM: Right breast s/p lumpectomy and radiation, no sign of local  recurrence, seroma noted in axillary tail, left breast benign LUNGS:  Clear to auscultation bilaterally.  No wheezes or rhonchi. HEART:  Regular rate and rhythm. No murmur appreciated. ABDOMEN:  Soft, nontender.  Positive, normoactive bowel sounds. No organomegaly palpated. MSK:  No focal spinal tenderness to palpation. Full range of motion bilaterally in the upper extremities. EXTREMITIES:  No peripheral edema.   SKIN:  Clear with no obvious rashes or skin changes. No nail dyscrasia. NEURO:  Nonfocal. Well oriented.  Appropriate affect.   LABORATORY DATA:  None for this visit.  DIAGNOSTIC IMAGING:  None for this visit.      ASSESSMENT AND PLAN:  Felicia Schultz is a pleasant 73 y.o. female with Stage IA right breast invasive ductal carcinoma, ER+/PR+/HER2-, diagnosed in 10/2023, treated with lumpectomy, adjuvant radiation therapy, and anti-estrogen therapy with Anastrozole  beginning in 10/2023.  She presents to the Survivorship Clinic for our initial meeting and routine follow-up post-completion of treatment for breast cancer.    1. Stage IA right breast cancer:  Felicia Schultz is continuing to recover from definitive treatment for breast cancer. She will follow-up with her medical oncologist, Dr.  Odean in 6 months with history and physical exam per surveillance protocol.  She will continue her anti-estrogen therapy with Anastrozole . Thus far, she is tolerating the Anastrozole  well, with minimal side effects. Her mammogram is due 10/2024; orders placed today.   Today, a comprehensive survivorship care plan and treatment summary was reviewed with the patient today detailing her breast cancer diagnosis, treatment course, potential late/long-term effects of treatment, appropriate follow-up care with recommendations for the future, and patient education resources.  A copy of this summary, along with a letter will be sent to the patient's primary care provider [facility] mail/fax/In Basket message after today's visit.     2. Bone health:  Given Felicia Schultz's age/history of breast cancer and her current treatment regimen including anti-estrogen therapy with Anastrozole , she is at risk for bone demineralization.  Bone density testing was ordered today to be completed before the end of the year.  She was given education on specific activities to promote bone health.  3. Cancer screening:  Due to Felicia Schultz history and her age, she should receive screening for skin cancers, colon cancer, and gynecologic cancers.  The information and recommendations are listed on the patient's comprehensive care plan/treatment summary and were reviewed in detail with the patient.    4. Health maintenance and wellness promotion: Felicia Schultz was encouraged to consume 5-7 servings of fruits and vegetables per day. We reviewed the Nutrition Rainbow handout.  She was also encouraged to engage in moderate to vigorous exercise for 30 minutes per day most days of the week.  She was instructed to limit her alcohol consumption and continue to abstain from tobacco use.     5. Support services/counseling: It is not uncommon for this period of the patient's cancer care trajectory to be one of many emotions and stressors.   She was given information regarding our available services and encouraged to contact me with any questions or for help enrolling in any of our support group/programs.   6 Breast seroma: Sent message to Dr. Aron regarding this for f/u and management. **Note De-Identified Ragone Obfuscation** Follow up instructions:    -Return to cancer center 6 months -See Dr. Aron in 04/2024  -Mammogram due in 10/2024 -DEXA 04/2024 -She is welcome to return back to the Survivorship Clinic at any time; no additional follow-up needed at this time.  -Consider referral back to survivorship as a long-term survivor for continued surveillance  The patient was provided an opportunity to ask questions and all were answered. The patient agreed with the plan and demonstrated an understanding of  the instructions.   Total encounter time:40 minutes*in face-to-face visit time, chart review, lab review, care coordination, order entry, and documentation of the encounter time.    Morna Kendall, NP 04/26/24 3:02 PM Medical Oncology and Hematology [facility] [address] [place], [place] [address] Tel. [phone]    Fax. [phone]  *Total Encounter Time as defined by the Centers for Medicare and Medicaid Services includes, in addition to the face-to-face time of a patient visit (documented in the note above) non-face-to-face time: obtaining and reviewing outside history, ordering and reviewing medications, tests or procedures, care coordination (communications with other health care professionals or caregivers) and documentation in the medical record.

## 2024-04-27 ENCOUNTER — Inpatient Hospital Stay: Admission: RE | Admit: 2024-04-27 | Discharge: 2024-04-27 | Attending: General Surgery | Admitting: General Surgery

## 2024-04-27 ENCOUNTER — Ambulatory Visit
Admission: RE | Admit: 2024-04-27 | Discharge: 2024-04-27 | Disposition: A | Discharge status: Home / Self Care | Source: Ambulatory Visit | Attending: Adult Health | Admitting: Adult Health

## 2024-04-27 ENCOUNTER — Ambulatory Visit: Admitting: Rehabilitation

## 2024-04-27 DIAGNOSIS — N6489 Other specified disorders of breast: Secondary | ICD-10-CM

## 2024-04-27 DIAGNOSIS — Z170 Estrogen receptor positive status [ER+]: Secondary | ICD-10-CM

## 2024-05-04 ENCOUNTER — Encounter: Payer: Self-pay | Admitting: Rehabilitation

## 2024-05-04 ENCOUNTER — Ambulatory Visit: Admitting: Rehabilitation

## 2024-05-04 DIAGNOSIS — Z483 Aftercare following surgery for neoplasm: Secondary | ICD-10-CM | POA: Diagnosis not present

## 2024-05-04 DIAGNOSIS — R293 Abnormal posture: Secondary | ICD-10-CM

## 2024-05-04 DIAGNOSIS — M25611 Stiffness of right shoulder, not elsewhere classified: Secondary | ICD-10-CM

## 2024-05-04 DIAGNOSIS — C50411 Malignant neoplasm of upper-outer quadrant of right female breast: Secondary | ICD-10-CM

## 2024-05-04 NOTE — Therapy (Signed)
**Note De-Identified [place] Obfuscation** OUTPATIENT PHYSICAL THERAPY  UPPER EXTREMITY ONCOLOGY EVALUATION  Patient Name: Felicia Schultz MRN: [id number] DOB:September 11, 1950, 73 y.o., female Today's Date: 05/04/2024  END OF SESSION:  PT End of Session - 05/04/24 1051     Visit Number 3    Number of Visits 5    Date for Recertification  05/06/24    Authorization Type none needed    PT Start Time 1058    PT Stop Time 1145    PT Time Calculation (min) 47 min    Activity Tolerance Patient tolerated treatment well    Behavior During Therapy [facility] for tasks assessed/performed           Past Medical History:  Diagnosis Date   Allergy     Anemia    Atrial flutter (HCC)    typical appearing   Basal cell carcinoma     nose - 1985   Breast cancer (HCC)    Difficult intubation    GERD (gastroesophageal reflux disease)    GI bleed    H/O cardiac radiofrequency ablation 2015   H/O gastric bypass 2005   History of diabetes mellitus 11/13/2015   Hypokalemia    Osteoporosis 2000   Paroxysmal atrial fibrillation (HCC)    Personal history of colonic polyps - sessile serrated 11/23/2013   Personal history of radiation therapy    PONV (postoperative nausea and vomiting)    Past Surgical History:  Procedure Laterality Date   ANTERIOR CERVICAL DECOMP/DISCECTOMY FUSION N/A 11/11/2023   Procedure: ANTERIOR CERVICAL DECOMPRESSION/DISCECTOMY FUSION CERVICAL FIVE-SIX LONEY REMOVAL;  Surgeon: Mavis Purchase, MD;  Location: [facility] OR;  Service: Neurosurgery;  Laterality: N/A;  ACDF,IP,PLATE/SCREWS R43;ENDD REMOVAL OLD PLATE   APPENDECTOMY     ATRIAL FLUTTER ABLATION N/A 09/05/2014   Procedure: ATRIAL FLUTTER ABLATION;  Surgeon: Lynwood Rakers, MD;  Location: [facility] CATH LAB;  Service: Cardiovascular;  Laterality: N/A;   BIOPSY  06/23/2023   Procedure: BIOPSY;  Surgeon: Wilhelmenia Aloha Raddle., MD;  Location: [facility];  Service: Gastroenterology;;   BREAST BIOPSY Right 10/21/2023   US  RT BREAST BX W LOC DEV 1ST LESION IMG BX SPEC US  GUIDE  10/21/2023 [facility]   BREAST BIOPSY Right 10/21/2023   US  RT BREAST BX W LOC DEV EA ADD LESION IMG BX SPEC US  GUIDE 10/21/2023 [facility]   BREAST BIOPSY Right 11/20/2023   US  RT RADIOACTIVE SEED EA ADD LESION 11/20/2023 [facility]   BREAST BIOPSY Right 11/20/2023   US  RT RADIOACTIVE SEED EA ADD LESION 11/20/2023 [facility]   BREAST BIOPSY  11/20/2023   US  RT RADIOACTIVE SEED LOC 11/20/2023 [facility]   BREAST EXCISIONAL BIOPSY Right 01/2016   BREAST LUMPECTOMY     BREAST LUMPECTOMY WITH RADIOACTIVE SEED LOCALIZATION Right 11/24/2023   Procedure: RADIOACTIVE SEED GUIDED RIGHT BREAST LUMPECTOMY TIMES TWO WITH MARGINS;  Surgeon: Aron Shoulders, MD;  Location: [facility];  Service: General;  Laterality: Right;   CARPAL TUNNEL RELEASE Bilateral    CHOLECYSTECTOMY     ENTEROSCOPY N/A 06/23/2023   Procedure: ENTEROSCOPY;  Surgeon: Wilhelmenia Aloha Raddle., MD;  Location: [facility];  Service: Gastroenterology;  Laterality: N/A;   FOOT SURGERY Bilateral 2012   shorten bones in both feet   GASTRIC BYPASS     MASTOPEXY Bilateral 12/03/2023   Procedure: MASTOPEXY;  Surgeon: Arelia Filippo, MD;  Location: [facility];  Service: Plastics;  Laterality: Bilateral;   REDUCTION MAMMAPLASTY     removal of ovary Right 1987   SENTINEL NODE BIOPSY Right **Note De-Identified [place] Obfuscation** 11/24/2023   Procedure: RADIOACTIVE SEED GUIDED RIGHT AXILLARY SENTINEL NODE BIOPSY, RIGHT AXILLARY SENTINEL LYMPH NODE BIOPSIES;  Surgeon: Aron Shoulders, MD;  Location: [facility];  Service: General;  Laterality: Right;   SPINE SURGERY  2000/2012   anterior cervical fusion with plating   SUBMUCOSAL TATTOO INJECTION  06/23/2023   Procedure: SUBMUCOSAL TATTOO INJECTION;  Surgeon: Wilhelmenia Aloha Raddle., MD;  Location: [facility];  Service: Gastroenterology;;   TOTAL ABDOMINAL HYSTERECTOMY  1999   WRIST SURGERY Right 2018   Dr. Ahmad -ganglion excision   Patient Active  Problem List   Diagnosis Date Noted   Status post surgery 11/11/2023   Cervical spondylosis with radiculopathy 11/11/2023   Malignant neoplasm of upper-outer quadrant of right breast in female, estrogen receptor positive (HCC) 10/26/2023   Obesity 08/20/2023   Thyroid  nodule 06/30/2023   Vitamin B12 deficiency 08/18/2022   Tremor 08/14/2021   History of atrial fibrillation 08/03/2020   Bilateral dry eyes 03/28/2020   Vitreous membranes and strands, right 03/28/2020   Nuclear sclerotic cataract of right eye 03/28/2020   Nuclear sclerotic cataract of left eye 03/28/2020   Posterior vitreous detachment of left eye 03/28/2020   Posterior vitreous detachment of right eye 03/28/2020   Hyperglycemia 02/18/2019   S/P gastric bypass 07/09/2018   Achilles tendinosis 04/30/2018   Spondylosis of lumbar joint 05/28/2017   Plantar fasciitis, bilateral 05/28/2017   Chronic pain of right wrist 05/28/2017   Haglund's deformity of right heel 02/02/2017   Postmenopausal symptoms 06/02/2016   Cervical spondylosis 07/19/2014   GERD (gastroesophageal reflux disease) 07/19/2014   Vitamin D  deficiency 07/19/2014   History of colonic polyps 11/23/2013   Allergic rhinitis 11/11/2011    PCP: Worth Kitty, MD  REFERRING PROVIDER: Mackey Chad, MD  REFERRING DIAG: Cording  THERAPY DIAG:  Aftercare following surgery for neoplasm  Malignant neoplasm of upper-outer quadrant of right breast in female, estrogen receptor positive (HCC)  Stiffness of right shoulder, not elsewhere classified  Abnormal posture  ONSET DATE: 02/26/24  Rationale for Evaluation and Treatment: Rehabilitation  SUBJECTIVE:                                                                                                                                                                                           SUBJECTIVE STATEMENT:  Patient reports everything has been feeling about the same since her last visit. She does feel like  she can still feel the cording but it does not limit her. She states it is one thousand percent better since she started physical therapy  Eval: I first noticed a hard line in the arm when I **Note De-Identified [place]uzzy Obfuscation** go to pick up my purse or back pack.   PERTINENT HISTORY: Patient was diagnosed on 10/19/2023 with right grade 2 invasive ductal carcinoma breast cancer. It measures 1.3 cm and is located in the upper outer quadrant. It is ER/PR positive and HER2 negative with a Ki67 of 5%. She has a biopsied proven positive axillary lymph node. She has had several spine surgeries including a C4-5 discectomy and fusion in 2011 and a C6-7 discectomy in 2000. She has atrial fibrillation and is scheduled for an anterior cervical decompression and discectomy with fusion on 11/11/2023. She reports left arm weakness as a result of her cervical problems.  She is now s/p 11/24/2023  right lumpectomy with SLNB with 1/3 lymph nodes positive , 12/03/2023 Right onconplastic reconstruction and left breast reduction by Dr. Arelia. Drains removed on 12/10/2023.  Completed radiation 02/10/24 without significant skin changes.    PAIN:  Are you having pain? No   PRECAUTIONS: Other: Cervical cord compression   RED FLAGS: None   WEIGHT BEARING RESTRICTIONS: No  FALLS:  Has patient fallen in last 6 months? 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     OBJECTIVE: Note: Objective measures were completed at Evaluation unless otherwise noted.  COGNITION: Overall cognitive status: Within functional limits for tasks assessed   PALPATION: 1 very visible cord noted in  the axilla with pull into the upper arm and towards the incision and 2 other cords noted with more skin stretch  OBSERVATIONS / OTHER ASSESSMENTS: cord noted with AROM in the axilla   SENSATION: WNL  POSTURE: WNL  UPPER EXTREMITY AROM/PROM:  A/PROM RIGHT   baseline  04/08/24 04/20/24 05/04/24  Shoulder extension 42 42    Shoulder flexion 136 135 150 146  Shoulder abduction 140 132 - cord evident  140 - cord evident 157  Shoulder internal rotation 37 40    Shoulder external rotation 66 80      (Blank rows = not tested)  A/PROM LEFT   eval  Shoulder extension 35  Shoulder flexion 128  Shoulder abduction 145  Shoulder internal rotation 60  Shoulder external rotation 67    (Blank rows = not tested)  CERVICAL AROM: All within normal limits:    Percent limited  Flexion WNL  Extension 50% limited  Right lateral flexion 50% limited  Left lateral flexion 50% limited  Right rotation 75% limited  Left rotation 75% limited     UPPER EXTREMITY STRENGTH: Not tested due to neck pain   L-DEX LYMPHEDEMA SCREENING:Pt is currently doing SOZO screens  QUICK DASH SURVEY: 45% from 37% at baseline **Note De-Identified [place] Obfuscation** TREATMENT DATE:  Pt permission and consent throughout each step of examination and treatment with modification and draping if requested when working on sensitive areas.  05/04/24 Therapeutic Exercise: Re-measured ROM & assessed goals Standing shoulder abduction wall walks x 10 R only Doorway pec stretch 3 x 20 sec hold Standing L stretch at wall 5 x 5 sec hold with VC and demonstration for correct form Updated HEP Manual Therapy:  STM to cording at axilla including PROM with pinning into flexion, D2, and abduction PROM into flexion, abduction and ER  04/20/24 Therapeutic Exercise: Re-measured ROM Standing shoulder abduction wall walks x 10 R only Supine shoulder  flexion with dowel Doorway pec stretch 3 x 20 sec hold Manual Therapy:  STM to cording at axilla including PROM with pinning into flexion, D2, and abduction PROM into flexion, abduction and ER  04/08/24 Eval performed briefly due to pt here recently Then STM and cording release to the Rt axilla including PROM with pinning into flexion, D2, and abduction Education on HEP per below with demo only.    PATIENT EDUCATION:  Education details: per today's note Person educated: Patient Education method: Explanation, Demonstration, and handout Education comprehension: verbalized understanding, returned demonstration, and needs further education  HOME EXERCISE PROGRAM: Access Code: [email or web address] URL: [email or web address] Date: 04/08/2024 Prepared by: Saddie Raw  Exercises - Standing Shoulder Abduction Slides at Wall  - 1 x daily - 7 x weekly - 1-3 sets - 10 reps - 15-20 seconds hold - Supine Shoulder Flexion Extension AAROM with Dowel  - 1 x daily - 7 x weekly - 10 reps - 5 seconds hold - Doorway Pec Stretch at 120 Degrees Abduction  - 1 x daily - 7 x weekly - 1 sets - 3 reps - 30 hold  Access Code: [email or web address] URL: [email or web address] Date: 05/04/2024 Prepared by: Delon Pack  Exercises - Standing 'L' Stretch at Counter  - 1-2 x daily - 7 x weekly - 1 sets - 5 reps - 5 sec hold - Doorway Pec Stretch at 90 Degrees Abduction  - 1 x daily - 7 x weekly - 1 sets - 3 reps - 20-30 sec hold - Doorway Pec Stretch at 60 Degrees Abduction with Arm Straight  - 1 x daily - 7 x weekly - 1 sets - 3 reps - 20-30 sec hold  ASSESSMENT:  CLINICAL IMPRESSION: Patient has improved shoulder ROM with no visible cording observed. Mild cording is palpated in the Rt axilla however, this has significantly improved since the patient's initial evaluation. The patient has met all her goals and reports she is ready to be discharged as she no longer has issues with stiffness or mobility. The  patient's HEP is updated to include standing doorway stretch with the arms placed lower and an L stretch. The patient will continue with regular SOZO screening, with her next screening on 05/23/24.   OBJECTIVE IMPAIRMENTS: decreased activity tolerance, decreased knowledge of condition, decreased knowledge of use of DME, decreased ROM, and increased fascial restrictions.   ACTIVITY LIMITATIONS: carrying and lifting  PARTICIPATION LIMITATIONS: yard work  PERSONAL FACTORS: Age, Fitness, and 1-2 comorbidities: SLNB, radiation are also affecting patient's functional outcome.   REHAB POTENTIAL: Excellent  CLINICAL DECISION MAKING: Stable/uncomplicated  EVALUATION COMPLEXITY: Low  GOALS: Goals reviewed with patient? Yes  SHORT TERM GOALS: Target date: 04/08/24  Pt will be ind with initial stretches for cording Baseline: Goal status: MET  2.  Pt will be educated on cording anatomy and physiology  Baseline: **Note De-Identified [place] Obfuscation** Goal status: MET   LONG TERM GOALS: Target date: 05/06/24  Pt will improve shoulder abduction to baseline of 140deg Baseline:  Goal status: MET  2.  Pt will be edu on final HEP Baseline:  Goal status: MET   PLAN:  PT FREQUENCY: 1x/week  PT DURATION: 4 weeks  PLANNED INTERVENTIONS: 97164- PT Re-evaluation, 97110-Therapeutic exercises, 97530- Therapeutic activity, 97112- Neuromuscular re-education, 97535- Self Care, [id number]- Manual therapy, Patient/Family education, Balance training, Joint mobilization, Therapeutic exercises, Therapeutic activity, Neuromuscular re-education, Gait training, and Self Care  PLAN FOR NEXT SESSION: Rt cording work , review HEP  Randall Pack, SPT  05/04/2024, 11:56 AM  I agree with the following treatment note after reviewing documentation. This session was performed under the supervision of a licensed clinician.  Saddie Raw, PT 05/04/24, 11:56 AM   PHYSICAL THERAPY DISCHARGE SUMMARY  Visits from Start of Care: 3   Current functional  level related to goals / functional outcomes: See above   Remaining deficits: Lymphedema risk    Education / Equipment: Final HEP  Plan: Patient agrees to discharge.  Patient is being discharged due to meeting the stated rehab goals.    Saddie Raw, PT 05/04/24, 11:57 AM

## 2024-05-23 ENCOUNTER — Ambulatory Visit

## 2024-05-23 ENCOUNTER — Other Ambulatory Visit

## 2024-05-24 ENCOUNTER — Other Ambulatory Visit (HOSPITAL_COMMUNITY)

## 2024-05-25 ENCOUNTER — Inpatient Hospital Stay (HOSPITAL_COMMUNITY): Admission: RE | Admit: 2024-05-25 | Discharge: 2024-05-25 | Attending: Adult Health | Admitting: Adult Health

## 2024-05-25 DIAGNOSIS — Z13820 Encounter for screening for osteoporosis: Secondary | ICD-10-CM | POA: Insufficient documentation

## 2024-05-25 DIAGNOSIS — Z79811 Long term (current) use of aromatase inhibitors: Secondary | ICD-10-CM | POA: Diagnosis present

## 2024-05-26 ENCOUNTER — Ambulatory Visit: Payer: Self-pay

## 2024-05-30 ENCOUNTER — Encounter: Payer: Self-pay | Admitting: Adult Health

## 2024-05-30 ENCOUNTER — Inpatient Hospital Stay: Attending: Licensed Clinical Social Worker | Admitting: Adult Health

## 2024-05-30 DIAGNOSIS — Z170 Estrogen receptor positive status [ER+]: Secondary | ICD-10-CM

## 2024-05-30 NOTE — Patient Instructions (Signed)
 Bone Health Bones protect organs, store calcium, anchor muscles, and support the whole body. Keeping your bones strong is important, especially as you get older. You can take actions to help keep your bones strong and healthy. Why is keeping my bones healthy important?  Keeping your bones healthy is important because your body constantly replaces bone cells. Cells get old, and new cells take their place. As we age, we lose bone cells because the body may not be able to make enough new cells to replace the old cells. The amount of bone cells and bone tissue you have is referred to as bone mass. The higher your bone mass, the stronger your bones. The aging process leads to an overall loss of bone mass in the body, which can increase the likelihood of: Broken bones. A condition in which the bones become weak and brittle (osteoporosis). A large decline in bone mass occurs in older adults. In women, it occurs about the time of menopause. What actions can I take to keep my bones healthy? Good health habits are important for maintaining healthy bones. This includes eating nutritious foods and exercising regularly. To have healthy bones, you need to get enough of the right minerals and vitamins. Most nutrition experts recommend getting these nutrients from the foods that you eat. In some cases, taking supplements may also be recommended. Doing certain types of exercise is also important for bone health. What are the nutritional recommendations for healthy bones?  Eating a well-balanced diet with plenty of calcium and vitamin D will help to protect your bones. Nutritional recommendations vary from person to person. Ask your health care provider what is healthy for you. Here are some general guidelines. Get enough calcium Calcium is the most important (essential) mineral for bone health. Most people can get enough calcium from their diet, but supplements may be recommended for people who are at risk for  osteoporosis. Good sources of calcium include: Dairy products, such as low-fat or nonfat milk, cheese, and yogurt. Dark green leafy vegetables, such as bok choy and broccoli. Foods that have calcium added to them (are fortified). Foods that may be fortified with calcium include orange juice, cereal, bread, soy beverages, and tofu products. Nuts, such as almonds. Follow these recommended amounts for daily calcium intake: Infants, 0-6 months: 200 mg. Infants, 6-12 months: 260 mg. Children, age 647-3: 700 mg. Children, age 64-8: 1,000 mg. Children, age 642-13: 1,300 mg. Teens, age 38-18: 1,300 mg. Adults, age 71-50: 1,000 mg. Adults, age 23-70: Men: 1,000 mg. Women: 1,200 mg. Adults, age 97 or older: 1,200 mg. Pregnant and breastfeeding females: Teens: 1,300 mg. Adults: 1,000 mg. Get enough vitamin D Vitamin D is the most essential vitamin for bone health. It helps the body absorb calcium. Sunlight stimulates the skin to make vitamin D, so be sure to get enough sunlight. If you live in a cold climate or you do not get outside often, your health care provider may recommend that you take vitamin D supplements. Good sources of vitamin D in your diet include: Egg yolks. Saltwater fish. Milk and cereal fortified with vitamin D. Follow these recommended amounts for daily vitamin D intake: Infants, 0-12 months: 400 international units (IU). Children and teens, age 647-18: 600 international units. Adults, age 31 or younger: 600 international units. Adults, age 9 or older: 600-1,000 international units. Get other important nutrients Other nutrients that are important for bone health include: Phosphorus. This mineral is found in meat, poultry, dairy foods, nuts, and legumes. The  recommended daily intake for adult men and adult women is 700 mg. Magnesium. This mineral is found in seeds, nuts, dark green vegetables, and legumes. The recommended daily intake for adult men is 400-420 mg. For adult women,  it is 310-320 mg. Vitamin K. This vitamin is found in green leafy vegetables. The recommended daily intake is 120 mcg for adult men and 90 mcg for adult women. What type of physical activity is best for building and maintaining healthy bones? Weight-bearing and strength-building activities are important for building and maintaining healthy bones. Weight-bearing activities cause muscles and bones to work against gravity. Strength-building activities increase the strength of the muscles that support bones. Weight-bearing and muscle-building activities include: Walking and hiking. Jogging and running. Dancing. Gym exercises. Lifting weights. Tennis and racquetball. Climbing stairs. Aerobics. Adults should get at least 30 minutes of moderate physical activity on most days. Children should get at least 60 minutes of moderate physical activity on most days. Ask your health care provider what type of exercise is best for you. How can I find out if my bone mass is low? Bone mass can be measured with an X-ray test called a bone mineral density (BMD) test. This test is recommended for all women who are age 19 or older. It may also be recommended for: Men who are age 55 or older. People who are at risk for osteoporosis because of: Having a long-term disease that weakens bones, such as kidney disease or rheumatoid arthritis. Having menopause earlier than normal. Taking medicine that weakens bones, such as steroids, thyroid hormones, or hormone treatment for breast cancer or prostate cancer. Smoking. Drinking three or more alcoholic drinks a day. Being underweight. Sedentary lifestyle. If you find that you have a low bone mass, you may be able to prevent osteoporosis or further bone loss by changing your diet and lifestyle. Where can I find more information? Bone Health & Osteoporosis Foundation: https://carlson-fletcher.info/ [employer]: www.bones.http://www.myers.net/ International Osteoporosis  Foundation: Investment banker, operational.iofbonehealth.org Summary The aging process leads to an overall loss of bone mass in the body, which can increase the likelihood of broken bones and osteoporosis. Eating a well-balanced diet with plenty of calcium and vitamin D will help to protect your bones. Weight-bearing and strength-building activities are also important for building and maintaining strong bones. Bone mass can be measured with an X-ray test called a bone mineral density (BMD) test. This information is not intended to replace advice given to you by your health care provider. Make sure you discuss any questions you have with your health care provider. Document Revised: 11/14/2020 Document Reviewed: 11/14/2020 Elsevier Patient Education  2024 [employer].

## 2024-05-30 NOTE — Assessment & Plan Note (Addendum)
**Note De-Identified [place] Obfuscation** 10/21/2023:Palpable right breast lump: 2 masses: Anterior mass 2.4 cm (intraductal papilloma previously resected 2017), posterior mass 1.3 cm grade 2 IDC with apocrine features ER 100%, PR 95%, HER2 negative, Ki-67 5%, 1 enlarged lymph node: Biopsy positive  11/06/23: CT and bone scan: No distant mets Recommendations: 1. Breast conserving surgery : Grade 2 IDC 0.5 cm, 1/3 Ln Positive 2. Oncotype DX testing to determine if chemotherapy would be of any benefit followed by 3. Adjuvant radiation therapy followed by 4. Adjuvant antiestrogen therapy with Anastrozole  (10/2023) _________________________________________________________________________________________________________________________________________________________

## 2024-05-30 NOTE — Progress Notes (Signed)
**Note De-Identified [place] Obfuscation** [facility] Cancer Follow up:    Kennyth Worth HERO, MD [address] [place] [address]   DIAGNOSIS:  Cancer Staging  Malignant neoplasm of upper-outer quadrant of right breast in female, estrogen receptor positive (HCC) Staging form: Breast, AJCC 8th Edition - Clinical: Stage IB (cT1c, cN1, cM0, G2, ER+, PR+, HER2-) - Signed by Odean Potts, MD on 10/28/2023 Stage prefix: Initial diagnosis Histologic grading system: 3 grade system - Pathologic stage from 11/24/2023: Stage IA (pT1a, pN1a, cM0, G2, ER+, PR+, HER2-, Oncotype DX score: 2) - Signed by Crawford Morna Pickle, NP on 04/25/2024 Stage prefix: Initial diagnosis Multigene prognostic tests performed: Oncotype DX Recurrence score range: Less than 11 Histologic grading system: 3 grade system    SUMMARY OF ONCOLOGIC HISTORY: Oncology History  Malignant neoplasm of upper-outer quadrant of right breast in female, estrogen receptor positive (HCC)  10/21/2023 Initial Diagnosis   Palpable right breast lump: 2 masses: Anterior mass 2.4 cm (intraductal papilloma previously resected 2017), posterior mass 1.3 cm grade 2 IDC with apocrine features ER 100%, PR 95%, HER2 negative, Ki-67 5%, 1 enlarged lymph node: Biopsy positive   10/28/2023 Cancer Staging   Staging form: Breast, AJCC 8th Edition - Clinical: Stage IB (cT1c, cN1, cM0, G2, ER+, PR+, HER2-) - Signed by Odean Potts, MD on 10/28/2023 Stage prefix: Initial diagnosis Histologic grading system: 3 grade system   10/2023 -  Anti-estrogen oral therapy   Anastrozole    11/06/2023 Imaging   CT and bone scan: No distant mets    11/24/2023 Surgery   Right lumpectomy: Grade 2 IDC 0.5 cm with IDC papilloma 6 mm, margins negative, LVI not identified, 1/3 lymph node positive, ER 100%, PR 95%, Ki-67 5%, HER2 negative by FISH   11/24/2023 Oncotype testing   2/1%   11/24/2023 Cancer Staging   Staging form: Breast, AJCC 8th Edition - Pathologic stage from 11/24/2023: Stage IA  (pT1a, pN1a, cM0, G2, ER+, PR+, HER2-, Oncotype DX score: 2) - Signed by Crawford Morna Pickle, NP on 04/25/2024 Stage prefix: Initial diagnosis Multigene prognostic tests performed: Oncotype DX Recurrence score range: Less than 11 Histologic grading system: 3 grade system   01/07/2024 - 02/10/2024 Radiation Therapy   Plan Name: Breast_R Site: Breast, Right Technique: 3D Mode: Photon Dose Per Fraction: 2 Gy Prescribed Dose (Delivered / Prescribed): 50 Gy / 50 Gy Prescribed Fxs (Delivered / Prescribed): 25 / 25   Plan Name: Breast_R_ScvP Site: Breast, Right Technique: 3D Mode: Photon Dose Per Fraction: 2 Gy Prescribed Dose (Delivered / Prescribed): 50 Gy / 50 Gy Prescribed Fxs (Delivered / Prescribed): 25 / 25     CURRENT THERAPY: Anastrozole   INTERVAL HISTORY:  Discussed the use of AI scribe software for clinical note transcription with the patient, who gave verbal consent to proceed.  History of Present Illness Felicia Schultz is a 73 year old female with invasive breast cancer in remission who presents for evaluation of osteopenia following recent bone density testing.  She underwent bone density testing on May 25, 2024, which showed osteopenia with a T-score of -2.3 in the left forearm. She is anxious about the meaning of these results and asks about management, including whether she should take calcium  and what weight-bearing exercises are recommended. She previously used a chewable calcium  supplement briefly and asks if she should restart it and how to structure an exercise program.  She reports upper respiratory symptoms since Thursday with fever, chills, and congestion and suspects influenza. She has not received an influenza vaccine this season **Note De-Identified [employer] Obfuscation** and feels too unwell to leave home, so she is considering virtual care for further evaluation.  She also asks about the status of a previously ordered blood test referred to as the guardant reveal test, noting she has not been  contacted about scheduling or results.     Patient Active Problem List   Diagnosis Date Noted   Status post surgery 11/11/2023   Cervical spondylosis with radiculopathy 11/11/2023   Malignant neoplasm of upper-outer quadrant of right breast in female, estrogen receptor positive (HCC) 10/26/2023   Obesity 08/20/2023   Thyroid  nodule 06/30/2023   Vitamin B12 deficiency 08/18/2022   Tremor 08/14/2021   History of atrial fibrillation 08/03/2020   Bilateral dry eyes 03/28/2020   Vitreous membranes and strands, right 03/28/2020   Nuclear sclerotic cataract of right eye 03/28/2020   Nuclear sclerotic cataract of left eye 03/28/2020   Posterior vitreous detachment of left eye 03/28/2020   Posterior vitreous detachment of right eye 03/28/2020   Hyperglycemia 02/18/2019   S/P gastric bypass 07/09/2018   Achilles tendinosis 04/30/2018   Spondylosis of lumbar joint 05/28/2017   Plantar fasciitis, bilateral 05/28/2017   Chronic pain of right wrist 05/28/2017   Haglund's deformity of right heel 02/02/2017   Postmenopausal symptoms 06/02/2016   Cervical spondylosis 07/19/2014   GERD (gastroesophageal reflux disease) 07/19/2014   Vitamin D  deficiency 07/19/2014   History of colonic polyps 11/23/2013   Allergic rhinitis 11/11/2011    is allergic to amoxicillin -pot clavulanate, clarithromycin, nitrofurantoin, sulfonamide derivatives, azithromycin , nsaids, sulfa antibiotics, and other.  MEDICAL HISTORY: Past Medical History:  Diagnosis Date   Allergy     Anemia    Atrial flutter (HCC)    typical appearing   Basal cell carcinoma     nose - 1985   Breast cancer (HCC)    Difficult intubation    GERD (gastroesophageal reflux disease)    GI bleed    H/O cardiac radiofrequency ablation 2015   H/O gastric bypass 2005   History of diabetes mellitus 11/13/2015   Hypokalemia    Osteoporosis 2000   Paroxysmal atrial fibrillation (HCC)    Personal history of colonic polyps - sessile serrated  11/23/2013   Personal history of radiation therapy    PONV (postoperative nausea and vomiting)     SURGICAL HISTORY: Past Surgical History:  Procedure Laterality Date   ANTERIOR CERVICAL DECOMP/DISCECTOMY FUSION N/A 11/11/2023   Procedure: ANTERIOR CERVICAL DECOMPRESSION/DISCECTOMY FUSION CERVICAL FIVE-SIX LONEY REMOVAL;  Surgeon: Mavis Purchase, MD;  Location: [facility] OR;  Service: Neurosurgery;  Laterality: N/A;  ACDF,IP,PLATE/SCREWS R43;ENDD REMOVAL OLD PLATE   APPENDECTOMY     ATRIAL FLUTTER ABLATION N/A 09/05/2014   Procedure: ATRIAL FLUTTER ABLATION;  Surgeon: Lynwood Rakers, MD;  Location: [facility] CATH LAB;  Service: Cardiovascular;  Laterality: N/A;   BIOPSY  06/23/2023   Procedure: BIOPSY;  Surgeon: Wilhelmenia Aloha Raddle., MD;  Location: [facility];  Service: Gastroenterology;;   BREAST BIOPSY Right 10/21/2023   US  RT BREAST BX W LOC DEV 1ST LESION IMG BX SPEC US  GUIDE 10/21/2023 [facility]   BREAST BIOPSY Right 10/21/2023   US  RT BREAST BX W LOC DEV EA ADD LESION IMG BX SPEC US  GUIDE 10/21/2023 [facility]   BREAST BIOPSY Right 11/20/2023   US  RT RADIOACTIVE SEED EA ADD LESION 11/20/2023 [facility]   BREAST BIOPSY Right 11/20/2023   US  RT RADIOACTIVE SEED EA ADD LESION 11/20/2023 [facility]   BREAST BIOPSY  11/20/2023   US  RT RADIOACTIVE SEED LOC 11/20/2023 [facility] **Note De-Identified [place] Obfuscation** BREAST EXCISIONAL BIOPSY Right 01/2016   BREAST LUMPECTOMY     BREAST LUMPECTOMY WITH RADIOACTIVE SEED LOCALIZATION Right 11/24/2023   Procedure: RADIOACTIVE SEED GUIDED RIGHT BREAST LUMPECTOMY TIMES TWO WITH MARGINS;  Surgeon: Aron Shoulders, MD;  Location: [facility];  Service: General;  Laterality: Right;   CARPAL TUNNEL RELEASE Bilateral    CHOLECYSTECTOMY     ENTEROSCOPY N/A 06/23/2023   Procedure: ENTEROSCOPY;  Surgeon: Wilhelmenia Aloha Raddle., MD;  Location: [facility];  Service: Gastroenterology;  Laterality: N/A;   FOOT SURGERY Bilateral 2012   shorten  bones in both feet   GASTRIC BYPASS     MASTOPEXY Bilateral 12/03/2023   Procedure: MASTOPEXY;  Surgeon: Arelia Filippo, MD;  Location: [facility];  Service: Plastics;  Laterality: Bilateral;   REDUCTION MAMMAPLASTY     removal of ovary Right 1987   SENTINEL NODE BIOPSY Right 11/24/2023   Procedure: RADIOACTIVE SEED GUIDED RIGHT AXILLARY SENTINEL NODE BIOPSY, RIGHT AXILLARY SENTINEL LYMPH NODE BIOPSIES;  Surgeon: Aron Shoulders, MD;  Location: [facility];  Service: General;  Laterality: Right;   SPINE SURGERY  2000/2012   anterior cervical fusion with plating   SUBMUCOSAL TATTOO INJECTION  06/23/2023   Procedure: SUBMUCOSAL TATTOO INJECTION;  Surgeon: Wilhelmenia Aloha Raddle., MD;  Location: [facility];  Service: Gastroenterology;;   TOTAL ABDOMINAL HYSTERECTOMY  1999   WRIST SURGERY Right 2018   Dr. Ahmad -ganglion excision    SOCIAL HISTORY: Social History   Socioeconomic History   Marital status: Married    Spouse name: Not on file   Number of children: 0   Years of education: Not on file   Highest education level: Not on file  Occupational History   Occupation: Designer, Fashion/clothing: [employer]  Tobacco Use   Smoking status: Never   Smokeless tobacco: Never  Vaping Use   Vaping status: Never Used  Substance and Sexual Activity   Alcohol use: No   Drug use: No   Sexual activity: Not on file  Other Topics Concern   Not on file  Social History Narrative   Married, no children   Therapist, sports for [employer].   Lives in [place].   1-2 caffeine/day   Social Drivers of Health   Tobacco Use: Low Risk (05/04/2024)   Patient History    Smoking Tobacco Use: Never    Smokeless Tobacco Use: Never    Passive Exposure: Not on file  Financial Resource Strain: Not on file  Food Insecurity: No Food Insecurity (04/25/2024)   Epic    Worried About Programme Researcher, Broadcasting/film/video in the Last Year: Never true    Ran  Out of Food in the Last Year: Never true  Transportation Needs: No Transportation Needs (04/25/2024)   Epic    Lack of Transportation (Medical): No    Lack of Transportation (Non-Medical): No  Physical Activity: Insufficiently Active (04/25/2024)   Exercise Vital Sign    Days of Exercise per Week: 2 days    Minutes of Exercise per Session: 30 min  Stress: Not on file  Social Connections: Unknown (04/25/2024)   Social Connection and Isolation Panel    Frequency of Communication with Friends and Family: Never    Frequency of Social Gatherings with Friends and Family: Never    Attends Religious Services: Never    Database Administrator or Organizations: Not on file    Attends Banker Meetings: Not on file **Note De-Identified [employer] Obfuscation** Marital Status: Not on file  Intimate Partner Violence: Not At Risk (04/25/2024)   Epic    Fear of Current or Ex-Partner: No    Emotionally Abused: No    Physically Abused: No    Sexually Abused: No  Depression (PHQ2-9): Low Risk (04/25/2024)   Depression (PHQ2-9)    PHQ-2 Score: 2  Alcohol Screen: Low Risk (04/25/2024)   Alcohol Screen    Last Alcohol Screening Score (AUDIT): 0  Housing: Unknown (04/25/2024)   Epic    Unable to Pay for Housing in the Last Year: No    Number of Times Moved in the Last Year: Not on file    Homeless in the Last Year: No  Utilities: Not At Risk (04/25/2024)   Epic    Threatened with loss of utilities: No  Health Literacy: Not on file    FAMILY HISTORY: Family History  Problem Relation Age of Onset   Diabetes Mother    Heart disease Mother    Arthritis Mother    Hearing loss Mother    Hyperlipidemia Mother    Hypertension Mother    Kidney disease Mother    Learning disabilities Mother    Miscarriages / Stillbirths Mother    Heart disease Father    Hypertension Father    Alcohol abuse Father    Hyperlipidemia Father    Scleroderma Sister    Early death Sister        schlederma   Miscarriages / Stillbirths Sister     Emphysema Sister    Diabetes Sister    Hyperlipidemia Sister    Hypertension Sister    Mental illness Sister    Miscarriages / Stillbirths Sister    Stroke Sister    Diabetes Sister    Heart disease Brother    Cancer Brother        lung   Stroke Brother    Lung cancer Brother        smoker   Diabetes Brother    Emphysema Maternal Grandmother    Cancer Maternal Grandmother        throat and lung(smoker)   Esophageal cancer Maternal Grandmother    Colon cancer Maternal Grandfather    Pancreatic cancer Neg Hx    Rectal cancer Neg Hx    Stomach cancer Neg Hx     Review of Systems  Constitutional:  Negative for appetite change, chills, fatigue, fever and unexpected weight change.  HENT:   Negative for hearing loss, lump/mass, mouth sores and trouble swallowing.   Eyes:  Negative for eye problems and icterus.  Respiratory:  Negative for chest tightness, cough and shortness of breath.   Cardiovascular:  Negative for chest pain, leg swelling and palpitations.  Gastrointestinal:  Negative for abdominal distention, abdominal pain, constipation, diarrhea, nausea and vomiting.  Endocrine: Negative for hot flashes.  Genitourinary:  Negative for difficulty urinating.   Musculoskeletal:  Negative for arthralgias.  Skin:  Negative for itching and rash.  Neurological:  Negative for dizziness, extremity weakness, headaches and numbness.  Hematological:  Negative for adenopathy. Does not bruise/bleed easily.  Psychiatric/Behavioral:  Negative for depression. The patient is not nervous/anxious.       PHYSICAL EXAMINATION Patient sounds well.  She is in no apparent distress.  Mood and behavior are normal.  Speech is normal.        ASSESSMENT and THERAPY PLAN:   Malignant neoplasm of upper-outer quadrant of right breast in female, estrogen receptor positive (HCC) 10/21/2023:Palpable right breast lump: 2 masses: **Note De-Identified Petrea Obfuscation** Anterior mass 2.4 cm (intraductal papilloma previously resected 2017),  posterior mass 1.3 cm grade 2 IDC with apocrine features ER 100%, PR 95%, HER2 negative, Ki-67 5%, 1 enlarged lymph node: Biopsy positive  11/06/23: CT and bone scan: No distant mets Recommendations: 1. Breast conserving surgery : Grade 2 IDC 0.5 cm, 1/3 Ln Positive 2. Oncotype DX testing to determine if chemotherapy would be of any benefit followed by 3. Adjuvant radiation therapy followed by 4. Adjuvant antiestrogen therapy _________________________________________________________________________________________________________________________________________________________    Assessment & Plan Osteopenia Osteopenia with T score of -2.3 in the left forearm, not meeting osteoporosis threshold. Her invasive breast cancer is relevant to bone health. No acute worsening. - Discussed bone density results and diagnosis of osteopenia. - Reviewed management options: bisphosphanate therapy versus optimizing calcium , vitamin D , and weight-bearing exercise. Discussed risks and benefits in detail of each. - Provided education on calcium  supplementation and weight-bearing exercise; included educational materials in the after visit summary. - Confirmed preference to defer bisphophanate therapy for now and proceed with lifestyle and supplement optimization. - Planned repeat bone density testing in two years. - Advised to contact the office with questions or concerns prior to next scheduled visit.  Upper Respiratory infection Symptoms concerning for viral illness such as influenza or COVID -Recommended obtaining at home flu/covid test -Reviewed how to schedule online virtual urgent care appointment after testing.    RTC in 10/2024 for f/u with Dr. Gudena.      The patient was provided an opportunity to ask questions and all were answered. The patient agreed with the plan and demonstrated an understanding of the instructions.   The patient was advised to call back or seek an in-person evaluation if  the symptoms worsen or if the condition fails to improve as anticipated.   I provided 10 minutes of non face-to-face telephone visit time during this encounter, and > 50% was spent counseling as documented under my assessment & plan.   Morna Kendall, NP 05/30/2024 10:43 AM Medical Oncology and Hematology [facility] [address] [place], [place] [address] Tel. [phone]    Fax. [phone]  *Total Encounter Time as defined by the Centers for Medicare and Medicaid Services includes, in addition to the face-to-face time of a patient visit (documented in the note above) non-face-to-face time: obtaining and reviewing outside history, ordering and reviewing medications, tests or procedures, care coordination (communications with other health care professionals or caregivers) and documentation in the medical record.

## 2024-06-21 ENCOUNTER — Telehealth: Payer: Self-pay

## 2024-06-21 NOTE — Telephone Encounter (Signed)
**Note De-Identified [employer] Obfuscation** Received notification Hayashi email that the patient was unresponsive to the mobile phlebotomy team attempting to collect the Guardant Reveal sample for genetic testing, as previously discussed and arranged at the last appointment with Morna Kendall. Contacted the patient directly and provided the phone number needed to schedule an appointment with the mobile team for sample collection.

## 2024-07-06 ENCOUNTER — Encounter: Payer: Self-pay | Admitting: Family Medicine

## 2024-07-06 ENCOUNTER — Ambulatory Visit: Admitting: Family Medicine

## 2024-07-06 VITALS — BP 120/78 | HR 83 | Ht 64.0 in | Wt 176.2 lb

## 2024-07-06 DIAGNOSIS — C50411 Malignant neoplasm of upper-outer quadrant of right female breast: Secondary | ICD-10-CM | POA: Diagnosis not present

## 2024-07-06 DIAGNOSIS — M545 Low back pain, unspecified: Secondary | ICD-10-CM

## 2024-07-06 DIAGNOSIS — R059 Cough, unspecified: Secondary | ICD-10-CM | POA: Diagnosis not present

## 2024-07-06 DIAGNOSIS — J020 Streptococcal pharyngitis: Secondary | ICD-10-CM | POA: Diagnosis not present

## 2024-07-06 DIAGNOSIS — E559 Vitamin D deficiency, unspecified: Secondary | ICD-10-CM | POA: Diagnosis not present

## 2024-07-06 DIAGNOSIS — Z170 Estrogen receptor positive status [ER+]: Secondary | ICD-10-CM

## 2024-07-06 LAB — POCT RAPID STREP A (OFFICE): Rapid Strep A Screen: POSITIVE — AB

## 2024-07-06 LAB — COMPREHENSIVE METABOLIC PANEL WITH GFR
ALT: 18 U/L (ref 3–35)
AST: 23 U/L (ref 5–37)
Albumin: 3.9 g/dL (ref 3.5–5.2)
Alkaline Phosphatase: 93 U/L (ref 39–117)
BUN: 24 mg/dL — ABNORMAL HIGH (ref 6–23)
CO2: 27 meq/L (ref 19–32)
Calcium: 8.9 mg/dL (ref 8.4–10.5)
Chloride: 107 meq/L (ref 96–112)
Creatinine, Ser: 0.65 mg/dL (ref 0.40–1.20)
GFR: 88.68 mL/min
Glucose, Bld: 98 mg/dL (ref 70–99)
Potassium: 3.5 meq/L (ref 3.5–5.1)
Sodium: 140 meq/L (ref 135–145)
Total Bilirubin: 0.5 mg/dL (ref 0.2–1.2)
Total Protein: 6.1 g/dL (ref 6.0–8.3)

## 2024-07-06 LAB — CBC
HCT: 37.6 % (ref 36.0–46.0)
Hemoglobin: 12.7 g/dL (ref 12.0–15.0)
MCHC: 33.7 g/dL (ref 30.0–36.0)
MCV: 91.3 fl (ref 78.0–100.0)
Platelets: 174 K/uL (ref 150.0–400.0)
RBC: 4.12 Mil/uL (ref 3.87–5.11)
RDW: 14.4 % (ref 11.5–15.5)
WBC: 3.1 K/uL — ABNORMAL LOW (ref 4.0–10.5)

## 2024-07-06 LAB — POC COVID19 BINAXNOW: SARS Coronavirus 2 Ag: NEGATIVE

## 2024-07-06 LAB — POCT INFLUENZA A/B
Influenza A, POC: NEGATIVE
Influenza B, POC: NEGATIVE

## 2024-07-06 MED ORDER — VITAMIN D (ERGOCALCIFEROL) 1.25 MG (50000 UNIT) PO CAPS: ORAL_CAPSULE | ORAL | 3 refills | Status: AC

## 2024-07-06 MED ORDER — ONDANSETRON 4 MG PO TBDP: 4.0000 mg | ORAL_TABLET | Freq: Three times a day (TID) | ORAL | 0 refills | Status: AC | PRN

## 2024-07-06 MED ORDER — CEFDINIR 300 MG PO CAPS: 300.0000 mg | ORAL_CAPSULE | Freq: Two times a day (BID) | ORAL | 0 refills | Status: AC

## 2024-07-06 NOTE — Assessment & Plan Note (Signed)
Continue management per oncology. 

## 2024-07-06 NOTE — Patient Instructions (Signed)
**Note De-Identified [employer] Obfuscation** It was very nice to see you today!  VISIT SUMMARY: During your visit, we addressed your new breast lump, symptoms of possible infection, and other health concerns. We confirmed a strep throat infection and discussed your ongoing breast cancer management and vitamin D  deficiency.  YOUR PLAN: STREPTOCOCCAL PHARYNGITIS: You have a confirmed strep throat infection. -You have been prescribed cefdinir  due to your adverse reactions to amoxicillin  and Augmentin . -We have ordered blood work to check your liver function, hydration status, and blood counts. -Please stay hydrated and try to eat carbohydrates. -Report if your symptoms do not improve.  ACUTE GASTROENTERITIS: You are experiencing diarrhea, nausea, and right-sided abdominal pain. -We have ordered blood work to check your hydration status and liver function. -Stay hydrated and monitor your symptoms.  MALIGNANT NEOPLASM OF UPPER-OUTER QUADRANT OF RIGHT BREAST, ESTROGEN RECEPTOR POSITIVE: You have a history of breast cancer and are currently under the care of Dr. Valley. -Continue to follow up with Dr. Gudina for your breast cancer management. -Contact Dr. Gudina if you have any concerns about your breast lump.  VITAMIN D  DEFICIENCY: You have a vitamin D  deficiency. -Continue your weekly vitamin D  supplementation. -We discussed the possibility of getting a 90-day supply covered by your insurance.  Return if symptoms worsen or fail to improve, for Annual Physical.   Take care, Dr Kennyth  PLEASE NOTE:  If you had any lab tests, please let us  know if you have not heard back within a few days. You may see your results on mychart before we have a chance to review them but we will give you a call once they are reviewed by us .   If we ordered any referrals today, please let us  know if you have not heard from their office within the next week.   If you had any urgent prescriptions sent in today, please check with the pharmacy within an hour  of our visit to make sure the prescription was transmitted appropriately.   Please try these tips to maintain a healthy lifestyle:  Eat at least 3 REAL meals and 1-2 snacks per day.  Aim for no more than 5 hours between eating.  If you eat breakfast, please do so within one hour of getting up.   Each meal should contain half fruits/vegetables, one quarter protein, and one quarter carbs (no bigger than a computer mouse)  Cut down on sweet beverages. This includes juice, soda, and sweet tea.   Drink at least 1 glass of water with each meal and aim for at least 8 glasses per day  Exercise at least 150 minutes every week.

## 2024-07-06 NOTE — Progress Notes (Signed)
**Note De-Identified [employer]ie Obfuscation** "  Felicia Schultz is a 74 y.o. female who presents today for an office visit.  Assessment/Plan:  New/Acute Problems: Strep Pharyngitis  Rapid strep positive.  COVID and flu negative.  Will treat with cefdinir  due to her amoxicillin  allergy .  Encouraged hydration.  She is having some mild systemic symptoms and would like to have labs today as well.  No focal signs of infection on exam.  We will check CBC and c-Met.  Will send in Zofran  to use as needed for her intermittent nausea.  She will let us  know if not proving over the next several days but we discussed reasons to return to care.  Follow-up as needed.  Chronic Problems Addressed Today: Vitamin D  deficiency On vitamin D  50,000 IUs weekly.  Will refill today.  Recheck when she comes back in for her physical.  Malignant neoplasm of upper-outer quadrant of right breast in female, estrogen receptor positive (HCC) Continue management per oncology.     Subjective:  HPI:  See assessment / plan for status of chronic conditions.      Discussed the use of AI scribe software for clinical note transcription with the patient, who gave verbal consent to proceed.  History of Present Illness Felicia Schultz is a 74 year old female with breast cancer who presents with a new breast lump and symptoms of possible infection.  She has a new painful breast lump that is increasing in size. Previous imaging suggested it was fluid-filled, but aspiration yielded only 2 cc of fluid, which was not sent for analysis. The lump is difficult to massage due to pain, and she is frustrated with the handling of her case by some medical staff.  She has been experiencing severe fatigue, diarrhea, nausea, and chills since Saturday, following a flu shot two weeks prior. She has been sleeping excessively over the weekend and feels exhausted. She describes severe pain on her right side, near the liver, which is tender and feels like it might 'explode'.  She has a history of  breast cancer and is currently on a cancer drug that causes generalized aches and pains. She has not been taking her medications due to nausea and difficulty keeping anything down.  She reports chills and feeling feverish, as noted by her husband, although she has not measured her temperature. She has not eaten much since Saturday, and attempts to eat have worsened her diarrhea and nausea.  She reports a sensation of swelling in her throat, though not pain. She initially thought she was developing a sinus infection due to runny nose and dry cough, but it did not progress as her usual sinus infections do.  She has been taking NyQuil and Tylenol  for symptom relief, with some improvement in her cough from NyQuil. She has a history of adverse reactions to certain antibiotics, including nausea with amoxicillin .  She attempted to return to work but struggles with completing full days due to fatigue.         Objective:  Physical Exam: BP 120/78   Pulse 83   Ht 5' 4 (1.626 m)   Wt 176 lb 3.2 oz (79.9 kg)   SpO2 94%   BMI 30.24 kg/m   Gen: No acute distress, resting comfortably HEENT: TMs clear.  OP erythematous. CV: Regular rate and rhythm with no murmurs appreciated Pulm: Normal work of breathing, clear to auscultation bilaterally with no crackles, wheezes, or rhonchi Neuro: Grossly normal, moves all extremities Psych: Normal affect and thought content **Note De-Identified [employer] Obfuscation** Worth HERO. Kennyth, MD 07/06/2024 9:04 AM  "

## 2024-07-06 NOTE — Assessment & Plan Note (Signed)
**Note De-Identified [place] Obfuscation** On vitamin D  50,000 IUs weekly.  Will refill today.  Recheck when she comes back in for her physical.

## 2024-07-08 ENCOUNTER — Ambulatory Visit: Payer: Self-pay | Admitting: Family Medicine

## 2024-07-08 DIAGNOSIS — D729 Disorder of white blood cells, unspecified: Secondary | ICD-10-CM

## 2024-07-08 NOTE — Progress Notes (Signed)
**Note De-Identified [place] Obfuscation** Her white blood cell count was mildly decreased.  This is probably due to her recent infection.  We can recheck again in 1 to 2 months.  Her BUN was also very mildly increased.  This usually indicates slight dehydration.  She should push fluids and we can also recheck this again in a couple of months.  All of her other labs are at goal.

## 2024-07-13 ENCOUNTER — Other Ambulatory Visit: Payer: Self-pay

## 2024-07-13 ENCOUNTER — Telehealth: Payer: Self-pay | Admitting: Adult Health

## 2024-07-13 DIAGNOSIS — R978 Other abnormal tumor markers: Secondary | ICD-10-CM

## 2024-07-13 DIAGNOSIS — C50411 Malignant neoplasm of upper-outer quadrant of right female breast: Secondary | ICD-10-CM

## 2024-07-13 NOTE — Telephone Encounter (Signed)
**Note De-Identified Karn Obfuscation** Patient was positive for ctDNA, reviewed with patient and ordered scans.  Patient has breast concern and I will see her in person on Friday at 1140, message sent to [facility].    Morna Kendall, NP 07/13/24 11:13 AM Medical Oncology and Hematology [facility] [address] [place], [place] [address] Tel. [phone]    Fax. [phone]

## 2024-07-14 ENCOUNTER — Encounter: Payer: Self-pay | Admitting: Adult Health

## 2024-07-15 ENCOUNTER — Inpatient Hospital Stay: Attending: Licensed Clinical Social Worker | Admitting: Adult Health

## 2024-07-15 ENCOUNTER — Encounter: Payer: Self-pay | Admitting: Adult Health

## 2024-07-15 VITALS — BP 133/61 | HR 80 | Temp 97.9°F | Resp 18 | Ht 64.0 in | Wt 179.9 lb

## 2024-07-15 DIAGNOSIS — N644 Mastodynia: Secondary | ICD-10-CM | POA: Insufficient documentation

## 2024-07-15 DIAGNOSIS — Z9884 Bariatric surgery status: Secondary | ICD-10-CM | POA: Diagnosis not present

## 2024-07-15 DIAGNOSIS — Z818 Family history of other mental and behavioral disorders: Secondary | ICD-10-CM | POA: Insufficient documentation

## 2024-07-15 DIAGNOSIS — Z880 Allergy status to penicillin: Secondary | ICD-10-CM | POA: Diagnosis not present

## 2024-07-15 DIAGNOSIS — Z8249 Family history of ischemic heart disease and other diseases of the circulatory system: Secondary | ICD-10-CM | POA: Insufficient documentation

## 2024-07-15 DIAGNOSIS — Z825 Family history of asthma and other chronic lower respiratory diseases: Secondary | ICD-10-CM | POA: Diagnosis not present

## 2024-07-15 DIAGNOSIS — Z1732 Human epidermal growth factor receptor 2 negative status: Secondary | ICD-10-CM | POA: Diagnosis not present

## 2024-07-15 DIAGNOSIS — Z170 Estrogen receptor positive status [ER+]: Secondary | ICD-10-CM | POA: Diagnosis not present

## 2024-07-15 DIAGNOSIS — Z9049 Acquired absence of other specified parts of digestive tract: Secondary | ICD-10-CM | POA: Diagnosis not present

## 2024-07-15 DIAGNOSIS — Z801 Family history of malignant neoplasm of trachea, bronchus and lung: Secondary | ICD-10-CM | POA: Insufficient documentation

## 2024-07-15 DIAGNOSIS — Z8419 Family history of other disorders of kidney and ureter: Secondary | ICD-10-CM | POA: Insufficient documentation

## 2024-07-15 DIAGNOSIS — Z822 Family history of deafness and hearing loss: Secondary | ICD-10-CM | POA: Diagnosis not present

## 2024-07-15 DIAGNOSIS — Z833 Family history of diabetes mellitus: Secondary | ICD-10-CM | POA: Insufficient documentation

## 2024-07-15 DIAGNOSIS — Z90710 Acquired absence of both cervix and uterus: Secondary | ICD-10-CM | POA: Diagnosis not present

## 2024-07-15 DIAGNOSIS — Z83438 Family history of other disorder of lipoprotein metabolism and other lipidemia: Secondary | ICD-10-CM | POA: Insufficient documentation

## 2024-07-15 DIAGNOSIS — Z860101 Personal history of adenomatous and serrated colon polyps: Secondary | ICD-10-CM | POA: Insufficient documentation

## 2024-07-15 DIAGNOSIS — C50411 Malignant neoplasm of upper-outer quadrant of right female breast: Secondary | ICD-10-CM | POA: Diagnosis present

## 2024-07-15 DIAGNOSIS — Z85828 Personal history of other malignant neoplasm of skin: Secondary | ICD-10-CM | POA: Insufficient documentation

## 2024-07-15 DIAGNOSIS — R978 Other abnormal tumor markers: Secondary | ICD-10-CM

## 2024-07-15 DIAGNOSIS — Z811 Family history of alcohol abuse and dependence: Secondary | ICD-10-CM | POA: Insufficient documentation

## 2024-07-15 DIAGNOSIS — M810 Age-related osteoporosis without current pathological fracture: Secondary | ICD-10-CM | POA: Diagnosis not present

## 2024-07-15 DIAGNOSIS — Z1721 Progesterone receptor positive status: Secondary | ICD-10-CM | POA: Diagnosis not present

## 2024-07-15 DIAGNOSIS — E669 Obesity, unspecified: Secondary | ICD-10-CM | POA: Insufficient documentation

## 2024-07-15 DIAGNOSIS — Z780 Asymptomatic menopausal state: Secondary | ICD-10-CM | POA: Insufficient documentation

## 2024-07-15 DIAGNOSIS — Z8261 Family history of arthritis: Secondary | ICD-10-CM | POA: Insufficient documentation

## 2024-07-15 DIAGNOSIS — Z923 Personal history of irradiation: Secondary | ICD-10-CM | POA: Diagnosis not present

## 2024-07-15 DIAGNOSIS — N63 Unspecified lump in unspecified breast: Secondary | ICD-10-CM | POA: Diagnosis not present

## 2024-07-15 DIAGNOSIS — Z79899 Other long term (current) drug therapy: Secondary | ICD-10-CM | POA: Diagnosis not present

## 2024-07-15 DIAGNOSIS — Z800 Family history of malignant neoplasm of digestive organs: Secondary | ICD-10-CM | POA: Insufficient documentation

## 2024-07-15 DIAGNOSIS — Z823 Family history of stroke: Secondary | ICD-10-CM | POA: Insufficient documentation

## 2024-07-15 DIAGNOSIS — Z8349 Family history of other endocrine, nutritional and metabolic diseases: Secondary | ICD-10-CM | POA: Insufficient documentation

## 2024-07-19 ENCOUNTER — Other Ambulatory Visit

## 2024-07-19 ENCOUNTER — Inpatient Hospital Stay: Admission: RE | Admit: 2024-07-19 | Discharge: 2024-07-19 | Attending: Adult Health | Admitting: Adult Health

## 2024-07-19 ENCOUNTER — Encounter

## 2024-07-19 ENCOUNTER — Other Ambulatory Visit: Payer: Self-pay | Admitting: Adult Health

## 2024-07-19 ENCOUNTER — Ambulatory Visit
Admission: RE | Admit: 2024-07-19 | Discharge: 2024-07-19 | Disposition: A | Source: Ambulatory Visit | Attending: Adult Health | Admitting: Adult Health

## 2024-07-19 DIAGNOSIS — C50411 Malignant neoplasm of upper-outer quadrant of right female breast: Secondary | ICD-10-CM

## 2024-07-19 DIAGNOSIS — R978 Other abnormal tumor markers: Secondary | ICD-10-CM

## 2024-07-19 DIAGNOSIS — N63 Unspecified lump in unspecified breast: Secondary | ICD-10-CM

## 2024-07-20 LAB — SURGICAL PATHOLOGY

## 2024-07-21 ENCOUNTER — Other Ambulatory Visit: Payer: Self-pay

## 2024-07-25 ENCOUNTER — Encounter

## 2024-07-25 ENCOUNTER — Other Ambulatory Visit

## 2024-07-29 ENCOUNTER — Other Ambulatory Visit (HOSPITAL_COMMUNITY)

## 2024-07-29 ENCOUNTER — Ambulatory Visit (HOSPITAL_COMMUNITY)

## 2024-08-02 ENCOUNTER — Inpatient Hospital Stay: Attending: Licensed Clinical Social Worker | Admitting: Adult Health

## 2024-10-20 ENCOUNTER — Ambulatory Visit: Admitting: Dermatology

## 2024-10-24 ENCOUNTER — Inpatient Hospital Stay: Admitting: Hematology and Oncology

## 2024-12-06 ENCOUNTER — Encounter: Admitting: Family Medicine
